# Patient Record
Sex: Female | Born: 1935 | Race: White | Hispanic: No | Marital: Married | Smoking: Former smoker
Health system: Southern US, Community
[De-identification: ages and names within clinical notes are randomized; demographics above are authoritative.]

## PROBLEM LIST (undated history)

## (undated) DIAGNOSIS — K5730 Diverticulosis of large intestine without perforation or abscess without bleeding: Secondary | ICD-10-CM

## (undated) DIAGNOSIS — W19XXXA Unspecified fall, initial encounter: Secondary | ICD-10-CM

## (undated) DIAGNOSIS — M4800 Spinal stenosis, site unspecified: Secondary | ICD-10-CM

## (undated) DIAGNOSIS — G4700 Insomnia, unspecified: Secondary | ICD-10-CM

## (undated) DIAGNOSIS — N390 Urinary tract infection, site not specified: Secondary | ICD-10-CM

## (undated) DIAGNOSIS — M8580 Other specified disorders of bone density and structure, unspecified site: Secondary | ICD-10-CM

## (undated) DIAGNOSIS — E559 Vitamin D deficiency, unspecified: Secondary | ICD-10-CM

## (undated) DIAGNOSIS — J449 Chronic obstructive pulmonary disease, unspecified: Secondary | ICD-10-CM

## (undated) DIAGNOSIS — C50919 Malignant neoplasm of unspecified site of unspecified female breast: Secondary | ICD-10-CM

## (undated) DIAGNOSIS — Z860100 Personal history of colon polyps, unspecified: Secondary | ICD-10-CM

## (undated) DIAGNOSIS — R7303 Prediabetes: Secondary | ICD-10-CM

## (undated) DIAGNOSIS — M1990 Unspecified osteoarthritis, unspecified site: Secondary | ICD-10-CM

## (undated) DIAGNOSIS — N8500 Endometrial hyperplasia, unspecified: Secondary | ICD-10-CM

## (undated) DIAGNOSIS — I10 Essential (primary) hypertension: Secondary | ICD-10-CM

## (undated) DIAGNOSIS — F32A Depression, unspecified: Secondary | ICD-10-CM

## (undated) DIAGNOSIS — F329 Major depressive disorder, single episode, unspecified: Secondary | ICD-10-CM

## (undated) HISTORY — DX: Vitamin D deficiency, unspecified: E55.9

## (undated) HISTORY — PX: OTHER SURGICAL HISTORY: SHX169

## (undated) HISTORY — DX: Malignant neoplasm of unspecified site of unspecified female breast: C50.919

## (undated) HISTORY — DX: Other specified disorders of bone density and structure, unspecified site: M85.80

## (undated) HISTORY — DX: Spinal stenosis, site unspecified: M48.00

## (undated) HISTORY — DX: Unspecified osteoarthritis, unspecified site: M19.90

## (undated) HISTORY — DX: Diverticulosis of large intestine without perforation or abscess without bleeding: K57.30

## (undated) HISTORY — DX: Essential (primary) hypertension: I10

## (undated) HISTORY — DX: Personal history of colon polyps, unspecified: Z86.0100

## (undated) HISTORY — DX: Personal history of colonic polyps: Z86.010

## (undated) HISTORY — PX: TONSILLECTOMY AND ADENOIDECTOMY: SHX28

## (undated) HISTORY — PX: CARPAL TUNNEL RELEASE: SHX101

## (undated) HISTORY — PX: CERVICAL FUSION: SHX112

## (undated) HISTORY — DX: Depression, unspecified: F32.A

## (undated) HISTORY — DX: Endometrial hyperplasia, unspecified: N85.00

## (undated) HISTORY — DX: Major depressive disorder, single episode, unspecified: F32.9

## (undated) HISTORY — DX: Insomnia, unspecified: G47.00

---

## 1966-02-16 HISTORY — PX: TUBAL LIGATION: SHX77

## 1973-02-16 HISTORY — PX: CERVICAL LAMINECTOMY: SHX94

## 1994-12-18 HISTORY — PX: ROTATOR CUFF REPAIR: SHX139

## 1995-11-17 HISTORY — PX: HYSTEROSCOPY: SHX211

## 1996-01-17 DIAGNOSIS — N8500 Endometrial hyperplasia, unspecified: Secondary | ICD-10-CM

## 1996-01-17 HISTORY — PX: TOTAL ABDOMINAL HYSTERECTOMY: SHX209

## 1996-01-17 HISTORY — PX: BILATERAL SALPINGOOPHORECTOMY: SHX1223

## 1996-01-17 HISTORY — DX: Endometrial hyperplasia, unspecified: N85.00

## 1998-09-17 HISTORY — PX: TOTAL HIP ARTHROPLASTY: SHX124

## 1998-09-24 ENCOUNTER — Inpatient Hospital Stay (HOSPITAL_COMMUNITY): Admission: RE | Admit: 1998-09-24 | Discharge: 1998-09-27 | Payer: Self-pay | Admitting: Orthopaedic Surgery

## 1998-12-18 HISTORY — PX: CHOLECYSTECTOMY: SHX55

## 1998-12-20 ENCOUNTER — Encounter (INDEPENDENT_AMBULATORY_CARE_PROVIDER_SITE_OTHER): Payer: Self-pay | Admitting: Specialist

## 1998-12-20 ENCOUNTER — Observation Stay (HOSPITAL_COMMUNITY): Admission: RE | Admit: 1998-12-20 | Discharge: 1998-12-20 | Payer: Self-pay | Admitting: Surgery

## 1999-02-17 DIAGNOSIS — M4800 Spinal stenosis, site unspecified: Secondary | ICD-10-CM

## 1999-02-17 HISTORY — DX: Spinal stenosis, site unspecified: M48.00

## 1999-07-04 ENCOUNTER — Ambulatory Visit (HOSPITAL_COMMUNITY): Admission: RE | Admit: 1999-07-04 | Discharge: 1999-07-04 | Payer: Self-pay | Admitting: Internal Medicine

## 2001-03-23 ENCOUNTER — Inpatient Hospital Stay (HOSPITAL_COMMUNITY): Admission: RE | Admit: 2001-03-23 | Discharge: 2001-03-24 | Payer: Self-pay | Admitting: Orthopaedic Surgery

## 2001-07-15 ENCOUNTER — Inpatient Hospital Stay (HOSPITAL_COMMUNITY): Admission: RE | Admit: 2001-07-15 | Discharge: 2001-07-16 | Payer: Self-pay | Admitting: Orthopaedic Surgery

## 2001-09-26 ENCOUNTER — Inpatient Hospital Stay (HOSPITAL_COMMUNITY): Admission: RE | Admit: 2001-09-26 | Discharge: 2001-09-28 | Payer: Self-pay | Admitting: Orthopaedic Surgery

## 2001-10-28 ENCOUNTER — Encounter: Admission: RE | Admit: 2001-10-28 | Discharge: 2001-10-28 | Payer: Self-pay | Admitting: Infectious Diseases

## 2003-03-20 HISTORY — PX: BREAST LUMPECTOMY: SHX2

## 2003-04-13 ENCOUNTER — Encounter (HOSPITAL_COMMUNITY): Admission: RE | Admit: 2003-04-13 | Discharge: 2003-07-12 | Payer: Self-pay | Admitting: Surgery

## 2003-04-26 ENCOUNTER — Ambulatory Visit (HOSPITAL_COMMUNITY): Admission: RE | Admit: 2003-04-26 | Discharge: 2003-04-26 | Payer: Self-pay | Admitting: Surgery

## 2003-04-26 ENCOUNTER — Encounter (INDEPENDENT_AMBULATORY_CARE_PROVIDER_SITE_OTHER): Payer: Self-pay | Admitting: *Deleted

## 2003-05-02 ENCOUNTER — Ambulatory Visit: Admission: RE | Admit: 2003-05-02 | Discharge: 2003-07-31 | Payer: Self-pay | Admitting: *Deleted

## 2003-05-09 ENCOUNTER — Ambulatory Visit (HOSPITAL_COMMUNITY): Admission: RE | Admit: 2003-05-09 | Discharge: 2003-05-09 | Payer: Self-pay | Admitting: Surgery

## 2003-06-17 HISTORY — PX: TOTAL KNEE ARTHROPLASTY: SHX125

## 2003-07-11 ENCOUNTER — Inpatient Hospital Stay (HOSPITAL_COMMUNITY): Admission: RE | Admit: 2003-07-11 | Discharge: 2003-07-15 | Payer: Self-pay | Admitting: Orthopedic Surgery

## 2003-11-30 ENCOUNTER — Ambulatory Visit: Admission: RE | Admit: 2003-11-30 | Discharge: 2003-11-30 | Payer: Self-pay | Admitting: *Deleted

## 2004-01-02 ENCOUNTER — Ambulatory Visit: Payer: Self-pay | Admitting: Internal Medicine

## 2004-02-10 ENCOUNTER — Observation Stay (HOSPITAL_COMMUNITY): Admission: EM | Admit: 2004-02-10 | Discharge: 2004-02-10 | Payer: Self-pay | Admitting: Emergency Medicine

## 2004-02-17 DIAGNOSIS — C50919 Malignant neoplasm of unspecified site of unspecified female breast: Secondary | ICD-10-CM

## 2004-02-17 HISTORY — DX: Malignant neoplasm of unspecified site of unspecified female breast: C50.919

## 2004-04-24 ENCOUNTER — Ambulatory Visit: Payer: Self-pay | Admitting: Internal Medicine

## 2004-04-30 ENCOUNTER — Ambulatory Visit: Payer: Self-pay | Admitting: Oncology

## 2004-06-13 ENCOUNTER — Encounter: Admission: RE | Admit: 2004-06-13 | Discharge: 2004-06-13 | Payer: Self-pay | Admitting: Orthopedic Surgery

## 2004-07-18 ENCOUNTER — Ambulatory Visit: Payer: Self-pay | Admitting: Internal Medicine

## 2004-08-18 ENCOUNTER — Encounter: Admission: RE | Admit: 2004-08-18 | Discharge: 2004-08-18 | Payer: Self-pay | Admitting: Orthopedic Surgery

## 2004-09-24 ENCOUNTER — Ambulatory Visit: Payer: Self-pay | Admitting: Internal Medicine

## 2004-10-31 ENCOUNTER — Ambulatory Visit (HOSPITAL_COMMUNITY): Admission: RE | Admit: 2004-10-31 | Discharge: 2004-10-31 | Payer: Self-pay | Admitting: Neurosurgery

## 2004-11-06 ENCOUNTER — Ambulatory Visit: Payer: Self-pay | Admitting: Oncology

## 2004-11-16 HISTORY — PX: TOTAL KNEE ARTHROPLASTY: SHX125

## 2004-11-16 HISTORY — PX: LUMBAR LAMINECTOMY: SHX95

## 2004-11-18 ENCOUNTER — Ambulatory Visit: Payer: Self-pay | Admitting: Internal Medicine

## 2005-03-31 ENCOUNTER — Ambulatory Visit: Payer: Self-pay | Admitting: Internal Medicine

## 2005-05-08 ENCOUNTER — Ambulatory Visit: Payer: Self-pay | Admitting: Oncology

## 2005-06-17 ENCOUNTER — Ambulatory Visit: Payer: Self-pay | Admitting: Internal Medicine

## 2005-07-21 ENCOUNTER — Ambulatory Visit: Payer: Self-pay | Admitting: Internal Medicine

## 2005-11-04 ENCOUNTER — Ambulatory Visit: Payer: Self-pay | Admitting: Oncology

## 2005-11-06 LAB — CBC WITH DIFFERENTIAL/PLATELET
BASO%: 0.6 % (ref 0.0–2.0)
Eosinophils Absolute: 0.1 10*3/uL (ref 0.0–0.5)
LYMPH%: 19.8 % (ref 14.0–48.0)
MCHC: 34.6 g/dL (ref 32.0–36.0)
MCV: 95.4 fL (ref 81.0–101.0)
MONO#: 0.4 10*3/uL (ref 0.1–0.9)
MONO%: 8.7 % (ref 0.0–13.0)
NEUT#: 3.2 10*3/uL (ref 1.5–6.5)
Platelets: 214 10*3/uL (ref 145–400)
RBC: 4.53 10*6/uL (ref 3.70–5.32)
RDW: 13.7 % (ref 11.3–14.5)
WBC: 4.7 10*3/uL (ref 3.9–10.0)

## 2005-11-06 LAB — COMPREHENSIVE METABOLIC PANEL
ALT: 18 U/L (ref 0–40)
AST: 16 U/L (ref 0–37)
Alkaline Phosphatase: 114 U/L (ref 39–117)
CO2: 29 mEq/L (ref 19–32)
Creatinine, Ser: 0.78 mg/dL (ref 0.40–1.20)
Sodium: 144 mEq/L (ref 135–145)
Total Bilirubin: 0.6 mg/dL (ref 0.3–1.2)
Total Protein: 6.8 g/dL (ref 6.0–8.3)

## 2005-11-06 LAB — CANCER ANTIGEN 27.29: CA 27.29: 11 U/mL (ref 0–39)

## 2005-12-08 ENCOUNTER — Ambulatory Visit: Payer: Self-pay | Admitting: Internal Medicine

## 2006-01-15 ENCOUNTER — Ambulatory Visit: Payer: Self-pay | Admitting: Internal Medicine

## 2006-01-27 ENCOUNTER — Inpatient Hospital Stay (HOSPITAL_COMMUNITY): Admission: RE | Admit: 2006-01-27 | Discharge: 2006-01-30 | Payer: Self-pay | Admitting: Orthopedic Surgery

## 2006-04-27 ENCOUNTER — Ambulatory Visit (HOSPITAL_BASED_OUTPATIENT_CLINIC_OR_DEPARTMENT_OTHER): Admission: RE | Admit: 2006-04-27 | Discharge: 2006-04-27 | Payer: Self-pay | Admitting: Orthopedic Surgery

## 2006-05-01 LAB — HM COLONOSCOPY: HM Colonoscopy: ABNORMAL

## 2006-05-11 ENCOUNTER — Ambulatory Visit: Payer: Self-pay | Admitting: Oncology

## 2006-05-14 ENCOUNTER — Ambulatory Visit: Payer: Self-pay | Admitting: Internal Medicine

## 2006-05-14 LAB — CONVERTED CEMR LAB
CO2: 34 meq/L — ABNORMAL HIGH (ref 19–32)
Creatinine, Ser: 0.7 mg/dL (ref 0.4–1.2)
Glucose, Bld: 81 mg/dL (ref 70–99)
Potassium: 4.9 meq/L (ref 3.5–5.1)
Sodium: 145 meq/L (ref 135–145)

## 2006-06-11 ENCOUNTER — Ambulatory Visit: Payer: Self-pay | Admitting: Internal Medicine

## 2006-06-11 LAB — CONVERTED CEMR LAB
ALT: 26 units/L (ref 0–40)
Albumin: 4.3 g/dL (ref 3.5–5.2)
Alkaline Phosphatase: 103 units/L (ref 39–117)
Direct LDL: 116.6 mg/dL
Total CHOL/HDL Ratio: 3.2
VLDL: 14 mg/dL (ref 0–40)

## 2006-06-25 ENCOUNTER — Observation Stay (HOSPITAL_COMMUNITY): Admission: EM | Admit: 2006-06-25 | Discharge: 2006-06-25 | Payer: Self-pay | Admitting: Emergency Medicine

## 2006-08-16 ENCOUNTER — Telehealth: Payer: Self-pay | Admitting: Internal Medicine

## 2006-08-19 ENCOUNTER — Ambulatory Visit: Payer: Self-pay | Admitting: Internal Medicine

## 2006-08-31 ENCOUNTER — Encounter: Payer: Self-pay | Admitting: Internal Medicine

## 2006-08-31 ENCOUNTER — Ambulatory Visit: Payer: Self-pay | Admitting: Internal Medicine

## 2006-09-09 ENCOUNTER — Ambulatory Visit: Payer: Self-pay | Admitting: Internal Medicine

## 2006-09-09 DIAGNOSIS — J45901 Unspecified asthma with (acute) exacerbation: Secondary | ICD-10-CM | POA: Insufficient documentation

## 2006-09-09 DIAGNOSIS — G4700 Insomnia, unspecified: Secondary | ICD-10-CM

## 2006-09-09 DIAGNOSIS — Z860100 Personal history of colon polyps, unspecified: Secondary | ICD-10-CM | POA: Insufficient documentation

## 2006-09-09 DIAGNOSIS — Z853 Personal history of malignant neoplasm of breast: Secondary | ICD-10-CM

## 2006-09-09 DIAGNOSIS — M4800 Spinal stenosis, site unspecified: Secondary | ICD-10-CM | POA: Insufficient documentation

## 2006-09-09 DIAGNOSIS — F329 Major depressive disorder, single episode, unspecified: Secondary | ICD-10-CM

## 2006-09-09 DIAGNOSIS — M1990 Unspecified osteoarthritis, unspecified site: Secondary | ICD-10-CM

## 2006-09-09 DIAGNOSIS — K5730 Diverticulosis of large intestine without perforation or abscess without bleeding: Secondary | ICD-10-CM | POA: Insufficient documentation

## 2006-09-09 DIAGNOSIS — I10 Essential (primary) hypertension: Secondary | ICD-10-CM

## 2006-09-09 LAB — CONVERTED CEMR LAB
Cholesterol: 208 mg/dL (ref 0–200)
HDL: 52 mg/dL (ref >39.0)
Total CHOL/HDL Ratio: 4
VLDL: 12 mg/dL (ref 0–40)

## 2006-09-15 ENCOUNTER — Telehealth: Payer: Self-pay | Admitting: Internal Medicine

## 2006-09-23 ENCOUNTER — Encounter: Payer: Self-pay | Admitting: Internal Medicine

## 2006-10-14 ENCOUNTER — Ambulatory Visit: Payer: Self-pay | Admitting: Oncology

## 2006-10-19 ENCOUNTER — Encounter: Payer: Self-pay | Admitting: Internal Medicine

## 2006-10-19 LAB — CBC WITH DIFFERENTIAL/PLATELET
BASO%: 0.5 % (ref 0.0–2.0)
EOS%: 1.4 % (ref 0.0–7.0)
HCT: 39.5 % (ref 34.8–46.6)
LYMPH%: 17.4 % (ref 14.0–48.0)
MCH: 32.9 pg (ref 26.0–34.0)
MCHC: 35.1 g/dL (ref 32.0–36.0)
MCV: 93.9 fL (ref 81.0–101.0)
MONO#: 0.5 10*3/uL (ref 0.1–0.9)
MONO%: 9.1 % (ref 0.0–13.0)
NEUT%: 71.6 % (ref 39.6–76.8)
Platelets: 165 10*3/uL (ref 145–400)
RBC: 4.21 10*6/uL (ref 3.70–5.32)

## 2006-10-19 LAB — HEMOGLOBIN A1C: Hgb A1c MFr Bld: 5.4 % (ref 4.6–6.1)

## 2006-10-19 LAB — COMPREHENSIVE METABOLIC PANEL
CO2: 22 mEq/L (ref 19–32)
Creatinine, Ser: 0.72 mg/dL (ref 0.40–1.20)
Glucose, Bld: 114 mg/dL — ABNORMAL HIGH (ref 70–99)
Total Bilirubin: 0.5 mg/dL (ref 0.3–1.2)

## 2006-10-19 LAB — CANCER ANTIGEN 27.29: CA 27.29: 9 U/mL (ref 0–39)

## 2006-11-26 ENCOUNTER — Ambulatory Visit: Payer: Self-pay | Admitting: Internal Medicine

## 2006-12-09 ENCOUNTER — Ambulatory Visit: Payer: Self-pay | Admitting: Internal Medicine

## 2006-12-09 LAB — CONVERTED CEMR LAB
AST: 27 units/L (ref 0–37)
Alkaline Phosphatase: 107 units/L (ref 39–117)
CO2: 34 meq/L — ABNORMAL HIGH (ref 19–32)
Chloride: 105 meq/L (ref 96–112)
Creatinine, Ser: 0.8 mg/dL (ref 0.4–1.2)
Hemoglobin: 14 g/dL
Hgb A1c MFr Bld: 5.5 % (ref 4.6–6.0)
Potassium: 5.6 meq/L — ABNORMAL HIGH (ref 3.5–5.1)
Sodium: 146 meq/L — ABNORMAL HIGH (ref 135–145)
Total Bilirubin: 1 mg/dL (ref 0.3–1.2)
Total Protein: 6.6 g/dL (ref 6.0–8.3)

## 2006-12-16 ENCOUNTER — Ambulatory Visit: Payer: Self-pay | Admitting: Internal Medicine

## 2006-12-22 ENCOUNTER — Ambulatory Visit (HOSPITAL_COMMUNITY): Admission: EM | Admit: 2006-12-22 | Discharge: 2006-12-22 | Payer: Self-pay | Admitting: Emergency Medicine

## 2007-01-05 ENCOUNTER — Inpatient Hospital Stay (HOSPITAL_COMMUNITY): Admission: RE | Admit: 2007-01-05 | Discharge: 2007-01-07 | Payer: Self-pay | Admitting: Orthopedic Surgery

## 2007-01-11 ENCOUNTER — Telehealth: Payer: Self-pay | Admitting: Internal Medicine

## 2007-01-17 DIAGNOSIS — M8580 Other specified disorders of bone density and structure, unspecified site: Secondary | ICD-10-CM

## 2007-01-17 HISTORY — DX: Other specified disorders of bone density and structure, unspecified site: M85.80

## 2007-01-27 ENCOUNTER — Encounter: Payer: Self-pay | Admitting: Internal Medicine

## 2007-02-03 ENCOUNTER — Encounter: Payer: Self-pay | Admitting: Internal Medicine

## 2007-03-10 ENCOUNTER — Encounter: Payer: Self-pay | Admitting: Internal Medicine

## 2007-03-14 ENCOUNTER — Encounter: Payer: Self-pay | Admitting: Internal Medicine

## 2007-03-21 ENCOUNTER — Telehealth: Payer: Self-pay | Admitting: Internal Medicine

## 2007-04-01 ENCOUNTER — Ambulatory Visit: Payer: Self-pay | Admitting: Internal Medicine

## 2007-04-01 DIAGNOSIS — E785 Hyperlipidemia, unspecified: Secondary | ICD-10-CM

## 2007-04-01 LAB — CONVERTED CEMR LAB
Calcium: 10.1 mg/dL (ref 8.4–10.5)
Chloride: 100 meq/L (ref 96–112)
Cholesterol: 213 mg/dL (ref 0–200)
Creatinine, Ser: 0.9 mg/dL (ref 0.4–1.2)
Direct LDL: 124.1 mg/dL
GFR calc non Af Amer: 65 mL/min
Hgb A1c MFr Bld: 5.4 % (ref 4.6–6.0)
Sodium: 139 meq/L (ref 135–145)
Total CHOL/HDL Ratio: 3.1
VLDL: 8 mg/dL (ref 0–40)

## 2007-04-07 ENCOUNTER — Ambulatory Visit: Payer: Self-pay | Admitting: Internal Medicine

## 2007-04-07 LAB — CONVERTED CEMR LAB
Cholesterol, target level: 200 mg/dL
HDL goal, serum: 40 mg/dL
LDL Goal: 100 mg/dL

## 2007-06-16 ENCOUNTER — Telehealth: Payer: Self-pay | Admitting: *Deleted

## 2007-07-01 ENCOUNTER — Telehealth: Payer: Self-pay | Admitting: Internal Medicine

## 2007-07-22 ENCOUNTER — Ambulatory Visit: Payer: Self-pay | Admitting: Oncology

## 2007-07-26 LAB — CBC WITH DIFFERENTIAL/PLATELET
BASO%: 0.3 % (ref 0.0–2.0)
Eosinophils Absolute: 0.1 10*3/uL (ref 0.0–0.5)
LYMPH%: 13.8 % — ABNORMAL LOW (ref 14.0–48.0)
MCHC: 34.7 g/dL (ref 32.0–36.0)
MCV: 93.4 fL (ref 81.0–101.0)
MONO#: 0.5 10*3/uL (ref 0.1–0.9)
MONO%: 9.5 % (ref 0.0–13.0)
NEUT#: 3.7 10*3/uL (ref 1.5–6.5)
RBC: 4.53 10*6/uL (ref 3.70–5.32)
RDW: 13.8 % (ref 11.3–14.5)
WBC: 4.9 10*3/uL (ref 3.9–10.0)

## 2007-07-26 LAB — COMPREHENSIVE METABOLIC PANEL
ALT: 22 U/L (ref 0–35)
Albumin: 4.5 g/dL (ref 3.5–5.2)
Alkaline Phosphatase: 93 U/L (ref 39–117)
Glucose, Bld: 93 mg/dL (ref 70–99)
Potassium: 4.7 mEq/L (ref 3.5–5.3)
Sodium: 136 mEq/L (ref 135–145)
Total Bilirubin: 0.9 mg/dL (ref 0.3–1.2)
Total Protein: 7.1 g/dL (ref 6.0–8.3)

## 2007-07-26 LAB — CANCER ANTIGEN 27.29: CA 27.29: 18 U/mL (ref 0–39)

## 2007-08-02 ENCOUNTER — Encounter: Payer: Self-pay | Admitting: Internal Medicine

## 2007-08-12 ENCOUNTER — Ambulatory Visit: Payer: Self-pay | Admitting: Internal Medicine

## 2007-08-12 DIAGNOSIS — T887XXA Unspecified adverse effect of drug or medicament, initial encounter: Secondary | ICD-10-CM

## 2007-08-12 LAB — CONVERTED CEMR LAB
AST: 24 units/L (ref 0–37)
Albumin: 4.1 g/dL (ref 3.5–5.2)
BUN: 14 mg/dL (ref 6–23)
Calcium: 9.7 mg/dL (ref 8.4–10.5)
Chloride: 103 meq/L (ref 96–112)
Creatinine, Ser: 0.7 mg/dL (ref 0.4–1.2)
GFR calc Af Amer: 105 mL/min
GFR calc non Af Amer: 87 mL/min
Hgb A1c MFr Bld: 5.5 % (ref 4.6–6.0)
Total Bilirubin: 1 mg/dL (ref 0.3–1.2)

## 2007-08-26 ENCOUNTER — Ambulatory Visit: Payer: Self-pay | Admitting: Internal Medicine

## 2007-09-06 ENCOUNTER — Telehealth: Payer: Self-pay | Admitting: Internal Medicine

## 2007-11-23 ENCOUNTER — Telehealth: Payer: Self-pay | Admitting: Internal Medicine

## 2007-11-29 ENCOUNTER — Telehealth: Payer: Self-pay | Admitting: Internal Medicine

## 2007-12-01 ENCOUNTER — Telehealth: Payer: Self-pay | Admitting: Internal Medicine

## 2007-12-02 ENCOUNTER — Ambulatory Visit: Payer: Self-pay | Admitting: Internal Medicine

## 2007-12-26 ENCOUNTER — Ambulatory Visit: Payer: Self-pay | Admitting: Internal Medicine

## 2007-12-30 LAB — CONVERTED CEMR LAB
ALT: 22 units/L (ref 0–35)
AST: 23 units/L (ref 0–37)
Bilirubin, Direct: 0.1 mg/dL (ref 0.0–0.3)
CO2: 33 meq/L — ABNORMAL HIGH (ref 19–32)
Chloride: 107 meq/L (ref 96–112)
Creatinine, Ser: 0.6 mg/dL (ref 0.4–1.2)
GFR calc non Af Amer: 104 mL/min
Hgb A1c MFr Bld: 5.4 % (ref 4.6–6.0)
Potassium: 5.7 meq/L — ABNORMAL HIGH (ref 3.5–5.1)
Sodium: 147 meq/L — ABNORMAL HIGH (ref 135–145)
Total Bilirubin: 1 mg/dL (ref 0.3–1.2)

## 2008-01-04 ENCOUNTER — Ambulatory Visit: Payer: Self-pay | Admitting: Internal Medicine

## 2008-01-04 DIAGNOSIS — E8881 Metabolic syndrome: Secondary | ICD-10-CM

## 2008-01-09 ENCOUNTER — Encounter: Payer: Self-pay | Admitting: Internal Medicine

## 2008-01-26 ENCOUNTER — Telehealth: Payer: Self-pay | Admitting: Internal Medicine

## 2008-01-30 ENCOUNTER — Encounter: Payer: Self-pay | Admitting: Internal Medicine

## 2008-02-27 ENCOUNTER — Ambulatory Visit: Payer: Self-pay | Admitting: Internal Medicine

## 2008-02-28 ENCOUNTER — Telehealth: Payer: Self-pay | Admitting: Internal Medicine

## 2008-04-03 ENCOUNTER — Telehealth: Payer: Self-pay | Admitting: *Deleted

## 2008-04-12 ENCOUNTER — Telehealth: Payer: Self-pay | Admitting: Internal Medicine

## 2008-05-04 ENCOUNTER — Ambulatory Visit: Payer: Self-pay | Admitting: Internal Medicine

## 2008-05-04 DIAGNOSIS — H6120 Impacted cerumen, unspecified ear: Secondary | ICD-10-CM

## 2008-05-07 LAB — CONVERTED CEMR LAB
Albumin: 4.1 g/dL (ref 3.5–5.2)
Bilirubin, Direct: 0.2 mg/dL (ref 0.0–0.3)
HDL: 79.7 mg/dL (ref >39.00)
Total CHOL/HDL Ratio: 3
Total Protein: 6.4 g/dL (ref 6.0–8.3)
VLDL: 10.8 mg/dL (ref 0.0–40.0)

## 2008-05-11 ENCOUNTER — Telehealth: Payer: Self-pay | Admitting: Internal Medicine

## 2008-05-11 DIAGNOSIS — M259 Joint disorder, unspecified: Secondary | ICD-10-CM | POA: Insufficient documentation

## 2008-06-26 ENCOUNTER — Telehealth: Payer: Self-pay | Admitting: Internal Medicine

## 2008-06-27 ENCOUNTER — Telehealth: Payer: Self-pay | Admitting: Internal Medicine

## 2008-07-12 ENCOUNTER — Ambulatory Visit: Payer: Self-pay | Admitting: Internal Medicine

## 2008-07-12 LAB — CONVERTED CEMR LAB
Cholesterol: 192 mg/dL (ref 0–200)
HDL: 80.3 mg/dL (ref >39.00)
LDL Cholesterol: 102 mg/dL — ABNORMAL HIGH (ref 0–99)
Triglycerides: 48 mg/dL (ref 0.0–149.0)

## 2008-07-27 ENCOUNTER — Telehealth: Payer: Self-pay | Admitting: Internal Medicine

## 2008-07-31 ENCOUNTER — Ambulatory Visit: Payer: Self-pay | Admitting: Oncology

## 2008-08-01 ENCOUNTER — Encounter: Payer: Self-pay | Admitting: Internal Medicine

## 2008-08-01 LAB — CBC WITH DIFFERENTIAL/PLATELET
Basophils Absolute: 0 10*3/uL (ref 0.0–0.1)
Eosinophils Absolute: 0 10*3/uL (ref 0.0–0.5)
HGB: 14.5 g/dL (ref 11.6–15.9)
MCV: 95.9 fL (ref 79.5–101.0)
MONO#: 0.6 10*3/uL (ref 0.1–0.9)
NEUT#: 4.4 10*3/uL (ref 1.5–6.5)
RDW: 13.3 % (ref 11.2–14.5)
WBC: 6 10*3/uL (ref 3.9–10.3)
lymph#: 0.9 10*3/uL (ref 0.9–3.3)

## 2008-08-01 LAB — COMPREHENSIVE METABOLIC PANEL
Albumin: 4.4 g/dL (ref 3.5–5.2)
BUN: 20 mg/dL (ref 6–23)
Calcium: 9.3 mg/dL (ref 8.4–10.5)
Chloride: 100 mEq/L (ref 96–112)
Glucose, Bld: 94 mg/dL (ref 70–99)
Potassium: 4.3 mEq/L (ref 3.5–5.3)
Sodium: 136 mEq/L (ref 135–145)
Total Protein: 6.9 g/dL (ref 6.0–8.3)

## 2008-09-19 ENCOUNTER — Encounter: Payer: Self-pay | Admitting: Internal Medicine

## 2008-10-03 ENCOUNTER — Telehealth: Payer: Self-pay | Admitting: Internal Medicine

## 2008-11-05 ENCOUNTER — Telehealth: Payer: Self-pay | Admitting: Internal Medicine

## 2008-11-12 ENCOUNTER — Ambulatory Visit: Payer: Self-pay | Admitting: Internal Medicine

## 2008-11-12 ENCOUNTER — Telehealth: Payer: Self-pay | Admitting: Internal Medicine

## 2008-11-12 DIAGNOSIS — R0989 Other specified symptoms and signs involving the circulatory and respiratory systems: Secondary | ICD-10-CM

## 2008-12-14 ENCOUNTER — Telehealth: Payer: Self-pay | Admitting: Internal Medicine

## 2008-12-21 ENCOUNTER — Encounter (INDEPENDENT_AMBULATORY_CARE_PROVIDER_SITE_OTHER): Payer: Self-pay | Admitting: *Deleted

## 2008-12-26 ENCOUNTER — Telehealth: Payer: Self-pay | Admitting: Internal Medicine

## 2008-12-28 ENCOUNTER — Encounter: Admission: RE | Admit: 2008-12-28 | Discharge: 2008-12-28 | Payer: Self-pay | Admitting: Allergy

## 2009-01-30 ENCOUNTER — Encounter (INDEPENDENT_AMBULATORY_CARE_PROVIDER_SITE_OTHER): Payer: Self-pay | Admitting: *Deleted

## 2009-01-30 ENCOUNTER — Encounter: Payer: Self-pay | Admitting: Internal Medicine

## 2009-02-04 ENCOUNTER — Ambulatory Visit: Payer: Self-pay | Admitting: Internal Medicine

## 2009-02-04 LAB — CONVERTED CEMR LAB
Direct LDL: 120.1 mg/dL
Total CHOL/HDL Ratio: 3
VLDL: 12.8 mg/dL (ref 0.0–40.0)

## 2009-02-11 LAB — CONVERTED CEMR LAB: Vit D, 25-Hydroxy: 31 ng/mL (ref 30–89)

## 2009-03-11 ENCOUNTER — Ambulatory Visit: Payer: Self-pay | Admitting: Internal Medicine

## 2009-04-12 ENCOUNTER — Encounter: Payer: Self-pay | Admitting: Internal Medicine

## 2009-04-16 ENCOUNTER — Emergency Department (HOSPITAL_COMMUNITY): Admission: EM | Admit: 2009-04-16 | Discharge: 2009-04-17 | Payer: Self-pay | Admitting: Emergency Medicine

## 2009-04-24 ENCOUNTER — Ambulatory Visit: Payer: Self-pay | Admitting: Internal Medicine

## 2009-06-19 ENCOUNTER — Encounter: Payer: Self-pay | Admitting: Internal Medicine

## 2009-07-03 ENCOUNTER — Encounter: Payer: Self-pay | Admitting: Internal Medicine

## 2009-07-12 ENCOUNTER — Ambulatory Visit: Payer: Self-pay | Admitting: Internal Medicine

## 2009-07-12 LAB — CONVERTED CEMR LAB
ALT: 20 units/L (ref 0–35)
AST: 22 units/L (ref 0–37)
Bilirubin, Direct: 0.2 mg/dL (ref 0.0–0.3)
Direct LDL: 114.5 mg/dL
HDL: 92.3 mg/dL (ref >39.00)
Total Bilirubin: 0.7 mg/dL (ref 0.3–1.2)

## 2009-07-19 ENCOUNTER — Ambulatory Visit: Payer: Self-pay | Admitting: Internal Medicine

## 2009-07-19 ENCOUNTER — Ambulatory Visit: Payer: Self-pay | Admitting: Cardiovascular Disease

## 2009-07-19 DIAGNOSIS — S060X9A Concussion with loss of consciousness of unspecified duration, initial encounter: Secondary | ICD-10-CM | POA: Insufficient documentation

## 2009-07-30 ENCOUNTER — Ambulatory Visit: Payer: Self-pay | Admitting: Oncology

## 2009-08-01 ENCOUNTER — Encounter: Payer: Self-pay | Admitting: Internal Medicine

## 2009-08-01 LAB — CBC WITH DIFFERENTIAL/PLATELET
BASO%: 0.6 % (ref 0.0–2.0)
Basophils Absolute: 0 10*3/uL (ref 0.0–0.1)
Eosinophils Absolute: 0.2 10*3/uL (ref 0.0–0.5)
HCT: 40.7 % (ref 34.8–46.6)
HGB: 13.6 g/dL (ref 11.6–15.9)
MONO#: 0.5 10*3/uL (ref 0.1–0.9)
NEUT#: 3.4 10*3/uL (ref 1.5–6.5)
NEUT%: 67.4 % (ref 38.4–76.8)
WBC: 5.1 10*3/uL (ref 3.9–10.3)
lymph#: 1 10*3/uL (ref 0.9–3.3)

## 2009-08-01 LAB — COMPREHENSIVE METABOLIC PANEL
ALT: 20 U/L (ref 0–35)
BUN: 17 mg/dL (ref 6–23)
CO2: 28 mEq/L (ref 19–32)
Calcium: 8.9 mg/dL (ref 8.4–10.5)
Chloride: 99 mEq/L (ref 96–112)
Creatinine, Ser: 0.69 mg/dL (ref 0.40–1.20)

## 2009-08-01 LAB — CANCER ANTIGEN 27.29: CA 27.29: 13 U/mL (ref 0–39)

## 2009-08-01 LAB — LACTATE DEHYDROGENASE: LDH: 152 U/L (ref 94–250)

## 2009-08-01 LAB — VITAMIN D 25 HYDROXY (VIT D DEFICIENCY, FRACTURES): Vit D, 25-Hydroxy: 38 ng/mL (ref 30–89)

## 2009-08-07 ENCOUNTER — Telehealth: Payer: Self-pay | Admitting: Internal Medicine

## 2009-08-07 ENCOUNTER — Ambulatory Visit: Payer: Self-pay | Admitting: Internal Medicine

## 2009-08-07 LAB — CONVERTED CEMR LAB: Rapid Strep: NEGATIVE

## 2009-09-27 ENCOUNTER — Telehealth: Payer: Self-pay | Admitting: Internal Medicine

## 2009-10-07 ENCOUNTER — Telehealth: Payer: Self-pay | Admitting: Internal Medicine

## 2009-10-08 ENCOUNTER — Ambulatory Visit: Payer: Self-pay | Admitting: Internal Medicine

## 2009-10-08 DIAGNOSIS — S2249XA Multiple fractures of ribs, unspecified side, initial encounter for closed fracture: Secondary | ICD-10-CM

## 2009-10-11 ENCOUNTER — Telehealth: Payer: Self-pay | Admitting: Internal Medicine

## 2009-11-06 ENCOUNTER — Telehealth: Payer: Self-pay | Admitting: Internal Medicine

## 2009-11-21 ENCOUNTER — Telehealth: Payer: Self-pay | Admitting: Internal Medicine

## 2009-12-11 ENCOUNTER — Telehealth: Payer: Self-pay | Admitting: Internal Medicine

## 2009-12-16 ENCOUNTER — Telehealth: Payer: Self-pay | Admitting: Internal Medicine

## 2009-12-19 ENCOUNTER — Encounter: Payer: Self-pay | Admitting: Internal Medicine

## 2009-12-30 ENCOUNTER — Ambulatory Visit: Payer: Self-pay | Admitting: Internal Medicine

## 2010-01-06 ENCOUNTER — Encounter: Payer: Self-pay | Admitting: Internal Medicine

## 2010-01-19 ENCOUNTER — Encounter: Payer: Self-pay | Admitting: Internal Medicine

## 2010-01-20 ENCOUNTER — Telehealth: Payer: Self-pay | Admitting: Internal Medicine

## 2010-02-05 ENCOUNTER — Encounter: Payer: Self-pay | Admitting: Internal Medicine

## 2010-03-09 ENCOUNTER — Encounter
Admission: RE | Admit: 2010-03-09 | Discharge: 2010-03-09 | Payer: Self-pay | Source: Home / Self Care | Attending: Orthopedic Surgery | Admitting: Orthopedic Surgery

## 2010-03-09 ENCOUNTER — Encounter: Payer: Self-pay | Admitting: Allergy

## 2010-03-11 ENCOUNTER — Telehealth: Payer: Self-pay | Admitting: Internal Medicine

## 2010-03-13 ENCOUNTER — Telehealth: Payer: Self-pay | Admitting: Internal Medicine

## 2010-03-14 ENCOUNTER — Telehealth: Payer: Self-pay | Admitting: Internal Medicine

## 2010-03-17 ENCOUNTER — Other Ambulatory Visit: Payer: Self-pay | Admitting: Neurosurgery

## 2010-03-17 DIAGNOSIS — M48061 Spinal stenosis, lumbar region without neurogenic claudication: Secondary | ICD-10-CM

## 2010-03-18 NOTE — Progress Notes (Signed)
Summary: sinus  Phone Note Call from Patient Call back at [phone]   Caller: VM Summary of Call: Sinus infection with thick yellow goo from nose & coughing it up.  Quickly goes into bronchitis for me.  T 99.5.  Send med to [facility].   Initial call taken by: Rudy Jew, RN,  December 16, 2009 9:00 AM  Follow-up for Phone Call        per dr Hervey Ard- may have z pack Follow-up by: Willy Eddy, LPN,  December 16, 2009 9:20 AM    New/Updated Medications: ZITHROMAX Z-PAK 250 MG TABS (AZITHROMYCIN) As directed Prescriptions: ZITHROMAX Z-PAK 250 MG TABS (AZITHROMYCIN) As directed  #1 x 0   Entered by:   Rudy Jew, RN   Authorized by:   Stacie Glaze MD   Signed by:   Rudy Jew, RN on 12/16/2009   Method used:   Electronically to        [facility] (retail)       [address]       [place], [place]  [id number]       Ph: [phone] or [phone]       Fax: [phone]   RxID:   [id number]

## 2010-03-18 NOTE — Progress Notes (Signed)
Summary: list of meds  Phone Note Call from Patient   Summary of Call: Wants copy of her meds faxed to her at [phone] or she will pick up if necessary.  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  Says it looks better on Dr. Ronnette Hila paper.  She could make list, but... Initial call taken by: Rudy Jew, RN,  November 06, 2009 12:17 PM  Follow-up for Phone Call        done Follow-up by: Willy Eddy, LPN,  November 06, 2009 1:54 PM

## 2010-03-18 NOTE — Progress Notes (Signed)
Summary: Phone note  Phone Note Call from Patient   Reason for Call: Acute Illness, Referral Summary of Call: Patient states she is a diabetic and was wondering why we didn't do a hemoglobin A1C when she had her blood drawn last. Patient would like to have A1C done if possible. She can be reached at [phone]. Initial call taken by: Darra Lis RMA,  Jun 26, 2008 11:10 AM  Follow-up for Phone Call        probably didnt do because it was 5.4 last time which is excellent and she has kept weight off, but she wants we can do anytime. just call scheduler and we can schedule with dm as code Follow-up by: Willy Eddy, LPN,  Jun 26, 2008 12:13 PM

## 2010-03-18 NOTE — Assessment & Plan Note (Signed)
Summary: f/u per pt//slm   Vital Signs:  Patient profile:   77 year old female Weight:      176 pounds Temp:     98.6 degrees F oral Pulse rate:   88 / minute Pulse rhythm:   regular Resp:     12 per minute BP sitting:   138 / 86  Vitals Entered By: Lynann Beaver CMA AAMA (December 30, 2009 2:00 PM)  Primary Care Provider:  Stacie Glaze MD   History of Present Illness: Dr [name] at [facility]/ [name] will do an anterior approach for hip right on Dec First needs a test for Chckpox to get shingles vaccines. Has no recollection of chicken pox yet had childern with it? Has increased nasal conjestion  and frequent sinus infections with use of saline  Hypertension History:      She denies headache, chest pain, palpitations, dyspnea with exertion, orthopnea, PND, peripheral edema, visual symptoms, neurologic problems, syncope, and side effects from treatment.        Positive major cardiovascular risk factors include female age 67 years old or older, diabetes, hyperlipidemia, and hypertension.  Negative major cardiovascular risk factors include non-tobacco-user status.      Preventive Screening-Counseling & Management  Alcohol-Tobacco     Smoking Status: quit     Tobacco Counseling: to remain off tobacco products  Problems Prior to Update: 1)  Contact or Exposure To Varicella  (ICD-V01.71) 2)  Bronchitis, Chronic, Acute Exacerbation  (ICD-491.21) 3)  Closed Fracture of Three Ribs  (ICD-807.03) 4)  Concussion With Loc of Unspecified Duration  (ICD-850.5) 5)  Oth Symptoms Involving Respiratory System&chest  (ICD-786.9) 6)  Other and Unspecified Hyperlipidemia  (ICD-272.4) 7)  Unspecified Disorder of Ankle and Foot Joint  (ICD-719.97) 8)  Cerumen Impaction, Bilateral  (ICD-380.4) 9)  Dysmetabolic Syndrome X  (ICD-277.7) 10)  Uns Advrs Eff Uns Rx Medicinal&biological Sbstnc  (ICD-995.20) 11)  Hyperlipidemia  (ICD-272.4) 12)  Diverticulosis, Colon  (ICD-562.10) 13)  Colonic Polyps, Hx  of  (ICD-V12.72) 14)  Family History of Colon Ca 1st Degree Relative <60  (ICD-V16.0) 15)  Family History Diabetes 1st Degree Relative  (ICD-V18.0) 16)  Stenosis, Lumbar Spine  (ICD-724.02) 17)  Osteoarthritis  (ICD-715.90) 18)  Hypertension  (ICD-401.9) 19)  Depression  (ICD-311) 20)  Breast Cancer, Hx of  (ICD-V10.3) 21)  Asthma  (ICD-493.90) 22)  Insomnia, Persistent  (ICD-307.42)  Medications Prior to Update: 1)  Calcium 500/d 500-125 Mg-Unit  Tabs (Calcium Carbonate-Vitamin D) .... 2 Once Daily 2)  Wellbutrin Xl 300 Mg  Tb24 (Bupropion Hcl) .... Once Daily 3)  Singulair 10 Mg  Tabs (Montelukast Sodium) .... Once Daily 4)  Klonopin 1 Mg  Tabs (Clonazepam) .... Once Daily 5)  Celebrex 200 Mg  Caps (Celecoxib) .... One By Mouth Bid 6)  Temazepam 30 Mg  Caps (Temazepam) .... One At Bedtime 7)  Benicar Hct 20-12.5 Mg Tabs (Olmesartan Medoxomil-Hctz) .Marland Kitchen.. 1 Once Daily 8)  Diazepam 10 Mg Tabs (Diazepam) .... One/ Half To One By Mouth Two Times A Day Prn 9)  B-50 Complex  Tabs (B Complex-Folic Acid) .Marland Kitchen.. 1 Once Daily 10)  Femara 2.5 Mg Tabs (Letrozole) .... One By Mouth Day 11)  Fish Oil Concentrate 1000 Mg Caps (Omega-3 Fatty Acids) .... Two By Mouth Bid 12)  Proair Hfa 108 (90 Base) Mcg/act Aers (Albuterol Sulfate) .... Use As Directed 13)  Vitamin C 500 Mg Tabs (Ascorbic Acid) .Marland Kitchen.. 1 Once Daily 14)  Zinc 15 66 Mg Tabs (Zinc Sulfate) .Marland KitchenMarland KitchenMarland Kitchen  1 Once Daily 15)  Ergocalciferol 50000 Unit Caps (Ergocalciferol) .Marland Kitchen.. 1 Every Week 16)  Spiriva Handihaler 18 Mcg Caps (Tiotropium Bromide Monohydrate) .... One Cap in Ward Dailu 17)  Hydrocortisone Ace-Pramoxine 2.5-1 % Crea (Hydrocortisone Ace-Pramoxine) .... Aplly Pr Two Times A Day 18)  Hydrocodone-Acetaminophen 5-500 Mg Tabs (Hydrocodone-Acetaminophen) .... One By Mouth  Two Times A Day 19)  Prednisone (Pak) 5 Mg Tabs (Prednisone) .... Take As Directed 20)  Zithromax Z-Pak 250 Mg Tabs (Azithromycin) .... As Directed  Current Medications  (verified): 1)  Calcium 500/d 500-125 Mg-Unit  Tabs (Calcium Carbonate-Vitamin D) .... 2 Once Daily 2)  Wellbutrin Xl 300 Mg  Tb24 (Bupropion Hcl) .... Once Daily 3)  Singulair 10 Mg  Tabs (Montelukast Sodium) .... Once Daily 4)  Klonopin 1 Mg  Tabs (Clonazepam) .... Once Daily 5)  Temazepam 30 Mg  Caps (Temazepam) .... One At Bedtime 6)  Benicar Hct 20-12.5 Mg Tabs (Olmesartan Medoxomil-Hctz) .Marland Kitchen.. 1 Once Daily 7)  Diazepam 10 Mg Tabs (Diazepam) .... One/ Half To One By Mouth Two Times A Day Prn 8)  Proair Hfa 108 (90 Base) Mcg/act Aers (Albuterol Sulfate) .... Use As Directed 9)  Vitamin C 500 Mg Tabs (Ascorbic Acid) .Marland Kitchen.. 1 Once Daily 10)  Hydrocortisone Ace-Pramoxine 2.5-1 % Crea (Hydrocortisone Ace-Pramoxine) .... Aplly Pr Two Times A Day 11)  Hydrocodone-Acetaminophen 5-500 Mg Tabs (Hydrocodone-Acetaminophen) .... One By Mouth  Two Times A Day 12)  Fluticasone Propionate 50 Mcg/act Susp (Fluticasone Propionate) .... Two Sprays in Nostril Daily  Allergies (verified): 1)  Morphine Sulfate (Morphine Sulfate) 2)  Sulfamethoxazole (Sulfamethoxazole)  Past History:  Family History: Last updated: 09-24-2006 mother died of complications of age Family History Diabetes 1st degree relative father and two brothers Family History of Colon CA 1st degree relative <60 father  Social History: Last updated: 2006-09-24 Occupation: Married Former Smoker quit 20 years ago   30+ pk years  Risk Factors: Exercise: yes (04/07/2007)  Risk Factors: Smoking Status: quit (12/30/2009)  Past medical, surgical, family and social histories (including risk factors) reviewed, and no changes noted (except as noted below).  Past Medical History: Reviewed history from Sep 24, 2006 and no changes required. Asthma Breast cancer, hx of Depression Hypertension Osteoarthritis insomnia Colonic polyps, hx of Diverticulosis, colon  Past Surgical History: Reviewed history from 09/24/2006 and no changes  required. Cervical fusion Cervical laminectomy x4 Lumbar laminectomy with spinal stenosis Cholecystectomy Total hip replacement Hysterectomy Total knee replacement Lumpectomy manipulation of hip for dislocation Tonsillectomy Carpal tunnel release bilateral  Family History: Reviewed history from September 24, 2006 and no changes required. mother died of complications of age Family History Diabetes 1st degree relative father and two brothers Family History of Colon CA 1st degree relative <60 father  Social History: Reviewed history from September 24, 2006 and no changes required. Occupation: Married Former Smoker quit 20 years ago   30+ pk years  Review of Systems  The patient denies anorexia, fever, weight loss, weight gain, vision loss, decreased hearing, hoarseness, chest pain, syncope, dyspnea on exertion, peripheral edema, prolonged cough, headaches, hemoptysis, abdominal pain, melena, hematochezia, severe indigestion/heartburn, hematuria, incontinence, genital sores, muscle weakness, suspicious skin lesions, transient blindness, difficulty walking, depression, unusual weight change, abnormal bleeding, enlarged lymph nodes, angioedema, breast masses, and testicular masses.    Physical Exam  General:  alert, well-hydrated, and pale.   Head:  normocephalic and atraumatic.   Eyes:  pupils equal and pupils round.   Ears:  wax impaction bilaterally Nose:  no external deformity and no nasal discharge.  Neck:  No deformities, masses, or tenderness noted. Lungs:  R decreased breath sounds and R wheezes.   Heart:  normal rate and regular rhythm.   Abdomen:  Bowel sounds positive,abdomen soft and non-tender without masses, organomegaly or hernias noted.   Impression & Recommendations:  Problem # 1:  CONTACT OR EXPOSURE TO VARICELLA (ICD-V01.71)  Orders: Venipuncture ([address]) T- * Misc. Laboratory test [phone]) Specimen Handling (09811)  Problem # 2:  DYSMETABOLIC SYNDROME X  (ICD-277.7) monitering of blood sugars  Problem # 3:  HYPERLIPIDEMIA (ICD-272.4)  Labs Reviewed: SGOT: 22 (07/12/2009)   SGPT: 20 (07/12/2009)  Lipid Goals: Chol Goal: 200 (04/07/2007)   HDL Goal: 40 (04/07/2007)   LDL Goal: 100 (04/07/2007)   TG Goal: 150 (04/07/2007)  10 Yr Risk Heart Disease: 13 % Prior 10 Yr Risk Heart Disease: Not enough information (09/09/2006)   HDL:92.30 (07/12/2009), 78.40 (02/04/2009)  LDL:102 (07/12/2008), DEL (04/01/2007)  Chol:229 (07/12/2009), 217 (02/04/2009)  Trig:56.0 (07/12/2009), 64.0 (02/04/2009)  Problem # 4:  HYPERTENSION (ICD-401.9)  Her updated medication list for this problem includes:    Benicar Hct 20-12.5 Mg Tabs (Olmesartan medoxomil-hctz) .Marland Kitchen... 1 once daily  BP today: 138/86 Prior BP: 120/80 (07/19/2009)  10 Yr Risk Heart Disease: 13 % Prior 10 Yr Risk Heart Disease: Not enough information (09/09/2006)  Labs Reviewed: K+: 5.7 (12/26/2007) Creat: : 0.6 (12/26/2007)   Chol: 229 (07/12/2009)   HDL: 92.30 (07/12/2009)   LDL: 102 (07/12/2008)   TG: 56.0 (07/12/2009)  Problem # 5:  ASTHMA (ICD-493.90)  The following medications were removed from the medication list:    Spiriva Handihaler 18 Mcg Caps (Tiotropium bromide monohydrate) ..... One cap in inhailer dailu    Prednisone (pak) 5 Mg Tabs (Prednisone) .Marland Kitchen... Take as directed Her updated medication list for this problem includes:    Singulair 10 Mg Tabs (Montelukast sodium) ..... Once daily    Proair Hfa 108 (90 Base) Mcg/act Aers (Albuterol sulfate) ..... Use as directed  Current Asthma Management Plan:  Green Zone:  SINGULAIR 10 MG  TABS Yellow Zone: Peak Flow:      200 to: 300  SPIRIVA HANDIHALER 18 MCG CAPS PROAIR HFA 108 (90 BASE) MCG/ACT AERS:  2 puffs every 4 hours as needed Red:  PROAIR HFA 108 (90 BASE) MCG/ACT AERS Call your physician for shortness of breath.    Complete Medication List: 1)  Calcium 500/d 500-125 Mg-unit Tabs (Calcium carbonate-vitamin d) ....  2 once daily 2)  Wellbutrin Xl 300 Mg Tb24 (Bupropion hcl) .... Once daily 3)  Singulair 10 Mg Tabs (Montelukast sodium) .... Once daily 4)  Klonopin 1 Mg Tabs (Clonazepam) .... Once daily 5)  Temazepam 30 Mg Caps (Temazepam) .... One at bedtime 6)  Benicar Hct 20-12.5 Mg Tabs (Olmesartan medoxomil-hctz) .Marland Kitchen.. 1 once daily 7)  Diazepam 10 Mg Tabs (Diazepam) .... One/ half to one by mouth two times a day prn 8)  Proair Hfa 108 (90 Base) Mcg/act Aers (Albuterol sulfate) .... Use as directed 9)  Vitamin C 500 Mg Tabs (Ascorbic acid) .Marland Kitchen.. 1 once daily 10)  Hydrocortisone Ace-pramoxine 2.5-1 % Crea (Hydrocortisone ace-pramoxine) .... Aplly pr two times a day 11)  Hydrocodone-acetaminophen 5-500 Mg Tabs (Hydrocodone-acetaminophen) .... One by mouth  two times a day 12)  Fluticasone Propionate 50 Mcg/act Susp (Fluticasone propionate) .... Two sprays in nostril daily  Asthma Management Plan    Plan based on PEF formula: Nunn and Dinah Beers Zone:SINGULAIR 10 MG  TABS  Yellow Zone: SPIRIVA HANDIHALER 18 MCG  CAPS PROAIR HFA 108 (90 BASE) MCG/ACT AERS:  2 puffs every 4 hours as needed  Red Zone: PROAIR HFA 108 (90 BASE) MCG/ACT AERS Call your physician for shortness of breath.    Hypertension Assessment/Plan:      The patient's hypertensive risk group is category C: Target organ damage and/or diabetes.  Her calculated 10 year risk of coronary heart disease is 13 %.  Today's blood pressure is 138/86.  Her blood pressure goal is < 130/80.  Patient Instructions: 1)  Please schedule a follow-up appointment in 3 months. Prescriptions: FLUTICASONE PROPIONATE 50 MCG/ACT SUSP (FLUTICASONE PROPIONATE) two sprays in nostril daily  #1 x 11   Entered and Authorized by:   Mariah Glaze MD   Signed by:   Mariah Glaze MD on 12/30/2009   Method used:   Electronically to        [facility] (retail)       [address]       [place], [place]  [id number]       Ph: [phone] or [phone]        Fax: [phone]   RxID:   [id number]    Orders Added: 1)  Venipuncture [36644] 2)  T- * Misc. Laboratory test [99999] 3)  Specimen Handling [99000] 4)  Est. Patient Level IV [03474]

## 2010-03-18 NOTE — Progress Notes (Signed)
Summary: pale,weak,clammy  Phone Note Call from Patient Call back at Home Phone [phone] Call back at [phone]   Summary of Call: No symptoms now, but so weak & so pale and clammy, cold sweat that everyone tells me to call the doctor.  No fever.  Stopped coughing.  Sinuses dried up.  Taking the  dry up med and antibiotic Avelox, has stopped the Delsym because she has not been coughing.  Had fallen & bruised ribs.  [facility] did xray, bruised ribs, no cracks.  Took about 8 pictures.  Radiologist read.  Rib pain better.  In bed.  No pain or short breath.  Some diarrhea this am.  Neighbors have had intestinal bug who care for her.  Haven't taken the prednisone.  No bleeding.  Took hydrocodone x2 yesterday.  Slept.  Stomach cramping woke her, had diarrhea & 2 hr later, had again.   Reyne Dumas.  Allergic sulfa & morphine.   Initial call taken by: Rudy Jew, RN,  October 11, 2009 12:31 PM  Follow-up for Phone Call        dr Lovell Sheehan noted message and states she really needs to push liquids Follow-up by: Willy Eddy, LPN,  October 11, 2009 3:07 PM  Additional Follow-up for Phone Call Additional follow up Details #1::        Phone Call Completed Additional Follow-up by: Rudy Jew, RN,  October 11, 2009 3:21 PM

## 2010-03-18 NOTE — Progress Notes (Signed)
Summary: sore throat  Phone Note Call from Patient   Caller: Patient Call For: Stacie Glaze MD Summary of Call: Pt is at the hospital with husband.  He has a fx hip and is having surgery.  Has sore throat with no fever.  Wants RX. Shel[facility] Plane.  [phone] Initial call taken by: Lynann Beaver CMA,  August 07, 2009 11:42 AM  Follow-up for Phone Call        ok to do strep test Follow-up by: Birdie Sons MD,  August 07, 2009 11:59 AM  Additional Follow-up for Phone Call Additional follow up Details #1::        Pt agrees. Additional Follow-up by: Lynann Beaver CMA,  August 07, 2009 12:19 PM

## 2010-03-18 NOTE — Assessment & Plan Note (Signed)
Summary: FU PER PT/NJR pt rsc/njr/pt rsc/cjr   Vital Signs:  Patient profile:   77 year old female Height:      67 inches Weight:      174 pounds BMI:     27.35 Temp:     98.2 degrees F oral Pulse rate:   72 / minute Resp:     14 per minute BP sitting:   136 / 86  (left arm)  Vitals Entered By: Willy Eddy, LPN (March 11, 2009 2:08 PM) CC: roa- has been sick for about 10 weeks since october - states he was having asmtha flare   CC:  roa- has been sick for about 10 weeks since october - states he was having asmtha flare.  History of Present Illness: INCREASED STRESSORS "exhausted with the care giver role" had bronchitis and asthma  in the fall, was seen by allergest and inhailers were rxed. She stopped them after she got better she feels that the inhailers and the nebulizers "drive her crazy" she has been out of medications for her blood pressure  Preventive Screening-Counseling & Management  Alcohol-Tobacco     Smoking Status: quit  Problems Prior to Update: 1)  Oth Symptoms Involving Respiratory System&chest  (ICD-786.9) 2)  Other and Unspecified Hyperlipidemia  (ICD-272.4) 3)  Unspecified Disorder of Ankle and Foot Joint  (ICD-719.97) 4)  Cerumen Impaction, Bilateral  (ICD-380.4) 5)  Dysmetabolic Syndrome X  (ICD-277.7) 6)  Uns Advrs Eff Uns Rx Medicinal&biological Sbstnc  (ICD-995.20) 7)  Hyperlipidemia  (ICD-272.4) 8)  Aodm  (ICD-250.00) 9)  Diverticulosis, Colon  (ICD-562.10) 10)  Colonic Polyps, Hx of  (ICD-V12.72) 11)  Family History of Colon Ca 1st Degree Relative <60  (ICD-V16.0) 12)  Family History Diabetes 1st Degree Relative  (ICD-V18.0) 13)  Stenosis, Lumbar Spine  (ICD-724.02) 14)  Osteoarthritis  (ICD-715.90) 15)  Hypertension  (ICD-401.9) 16)  Depression  (ICD-311) 17)  Breast Cancer, Hx of  (ICD-V10.3) 18)  Asthma  (ICD-493.90) 19)  Insomnia, Persistent  (ICD-307.42)  Medications Prior to Update: 1)  Calcium 500/d 500-125 Mg-Unit  Tabs  (Calcium Carbonate-Vitamin D) .... 2 Once Daily 2)  Wellbutrin Xl 300 Mg  Tb24 (Bupropion Hcl) .... Once Daily 3)  Singulair 10 Mg  Tabs (Montelukast Sodium) .... Once Daily 4)  Klonopin 1 Mg  Tabs (Clonazepam) .... Once Daily 5)  Celebrex 200 Mg  Caps (Celecoxib) .... One By Mouth Bid 6)  Temazepam 30 Mg  Caps (Temazepam) .... One At Bedtime 7)  Benicar Hct 20-12.5 Mg Tabs (Olmesartan Medoxomil-Hctz) .Marland Kitchen.. 1 Once Daily 8)  Valium 5 Mg  Tabs (Diazepam) .Marland Kitchen.. 1 Once Daily and        Q 8 Hours As Needed 9)  B-50 Complex  Tabs (B Complex-Folic Acid) .Marland Kitchen.. 1 Once Daily 10)  Femara 2.5 Mg Tabs (Letrozole) .... One By Mouth Day 11)  Fish Oil Concentrate 1000 Mg Caps (Omega-3 Fatty Acids) .... Two By Mouth Bid 12)  Proair Hfa 108 (90 Base) Mcg/act Aers (Albuterol Sulfate) .... Use As Directed 13)  Vitamin C 500 Mg Tabs (Ascorbic Acid) .Marland Kitchen.. 1 Once Daily 14)  Zinc 15 66 Mg Tabs (Zinc Sulfate) .Marland Kitchen.. 1 Once Daily 15)  Ergocalciferol 50000 Unit Caps (Ergocalciferol) .Marland Kitchen.. 1 Twice A Week 16)  Spiriva Handihaler 18 Mcg Caps (Tiotropium Bromide Monohydrate) .... One Cap in [place] Dailu 17)  Biaxin 500 Mg Tabs (Clarithromycin) .... One Two Times A Day For 10 Days.  Current Medications (verified): 1)  Calcium 500/d 500-125  Mg-Unit  Tabs (Calcium Carbonate-Vitamin D) .... 2 Once Daily 2)  Wellbutrin Xl 300 Mg  Tb24 (Bupropion Hcl) .... Once Daily 3)  Singulair 10 Mg  Tabs (Montelukast Sodium) .... Once Daily 4)  Klonopin 1 Mg  Tabs (Clonazepam) .... Once Daily 5)  Celebrex 200 Mg  Caps (Celecoxib) .... One By Mouth Bid 6)  Temazepam 30 Mg  Caps (Temazepam) .... One At Bedtime 7)  Benicar Hct 20-12.5 Mg Tabs (Olmesartan Medoxomil-Hctz) .Marland Kitchen.. 1 Once Daily 8)  Valium 5 Mg  Tabs (Diazepam) .Marland Kitchen.. 1 Once Daily and        Q 8 Hours As Needed 9)  B-50 Complex  Tabs (B Complex-Folic Acid) .Marland Kitchen.. 1 Once Daily 10)  Femara 2.5 Mg Tabs (Letrozole) .... One By Mouth Day 11)  Fish Oil Concentrate 1000 Mg Caps (Omega-3 Fatty  Acids) .... Two By Mouth Bid 12)  Proair Hfa 108 (90 Base) Mcg/act Aers (Albuterol Sulfate) .... Use As Directed 13)  Vitamin C 500 Mg Tabs (Ascorbic Acid) .Marland Kitchen.. 1 Once Daily 14)  Zinc 15 66 Mg Tabs (Zinc Sulfate) .Marland Kitchen.. 1 Once Daily 15)  Ergocalciferol 50000 Unit Caps (Ergocalciferol) .Marland Kitchen.. 1 Twice A Week 16)  Spiriva Handihaler 18 Mcg Caps (Tiotropium Bromide Monohydrate) .... One Cap in [place] Dailu 17)  Hydrocortisone Ace-Pramoxine 2.5-1 % Crea (Hydrocortisone Ace-Pramoxine) .... 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  Past Medical History: Reviewed history from October 06, 2006 and no changes required. Asthma Breast cancer, hx of Depression Hypertension Osteoarthritis insomnia Colonic polyps, hx of Diverticulosis, colon  Past Surgical History: Reviewed history from 06-Oct-2006 and no changes required. Cervical fusion Cervical laminectomy x4 Lumbar laminectomy with spinal stenosis Cholecystectomy Total hip replacement Hysterectomy Total knee replacement Lumpectomy manipulation of hip for dislocation Tonsillectomy Carpal tunnel release bilateral  Family History: Reviewed history from 2006-10-06 and no changes required. mother died of complications of age Family History Diabetes 1st degree relative father and two brothers Family  History of Colon CA 1st degree relative <60 father  Social History: Reviewed history from 10/06/2006 and no changes required. Occupation: Married Former Smoker quit 20 years ago   30+ pk years  Review of Systems  The patient denies anorexia, fever, weight loss, weight gain, vision loss, decreased hearing, hoarseness, chest pain, syncope, dyspnea on exertion, peripheral edema, prolonged cough, headaches, hemoptysis, abdominal pain, melena, hematochezia, severe indigestion/heartburn, hematuria, incontinence, genital sores, muscle weakness, suspicious skin lesions, transient blindness, difficulty walking, depression, unusual weight change, abnormal bleeding, enlarged lymph nodes, angioedema, and breast masses.    Physical Exam  General:  Well-developed,well-nourished,in no acute distress; alert,appropriate and cooperative throughout examination Head:  Normocephalic and atraumatic without obvious abnormalities. No apparent alopecia or balding. Eyes:  pupils equal and pupils round.   Ears:  wax impaction bilaterally Nose:  no external deformity and no nasal discharge.   Mouth:  good dentition and pharynx pink and moist.   Neck:  No deformities, masses,  or tenderness noted. Lungs:  normal respiratory effort and no wheezes.   Heart:  normal rate and regular rhythm.   Abdomen:  Bowel sounds positive,abdomen soft and non-tender without masses, organomegaly or hernias noted. Extremities:  No clubbing, cyanosis, edema, or deformity noted with normal full range of motion of all joints.   Neurologic:  No cranial nerve deficits noted. Station and gait are normal. Plantar reflexes are down-going bilaterally. DTRs are symmetrical throughout. Sensory, motor and coordinative functions appear intact.   Impression & Recommendations:  Problem # 1:  HYPERLIPIDEMIA (ICD-272.4)  Labs Reviewed: SGOT: 23 (05/04/2008)   SGPT: 20 (05/04/2008)  Lipid Goals: Chol Goal: 200 (04/07/2007)   HDL Goal: 40  (04/07/2007)   LDL Goal: 100 (04/07/2007)   TG Goal: 150 (04/07/2007)  Prior 10 Yr Risk Heart Disease: Not enough information (09/09/2006)   HDL:78.40 (02/04/2009), 80.30 (07/12/2008)  LDL:102 (07/12/2008), DEL (04/01/2007)  Chol:217 (02/04/2009), 192 (07/12/2008)  Trig:64.0 (02/04/2009), 48.0 (07/12/2008)  Problem # 2:  AODM (ICD-250.00)  Her updated medication list for this problem includes:    Benicar Hct 20-12.5 Mg Tabs (Olmesartan medoxomil-hctz) .Marland Kitchen... 1 once daily  Labs Reviewed: Creat: 0.6 (12/26/2007)    Reviewed HgBA1c results: 5.7 (02/04/2009)  5.6 (07/12/2008)  Problem # 3:  DEPRESSION (ICD-311) Assessment: Unchanged  Her updated medication list for this problem includes:    Wellbutrin Xl 300 Mg Tb24 (Bupropion hcl) ..... Once daily    Klonopin 1 Mg Tabs (Clonazepam) ..... Once daily    Valium 5 Mg Tabs (Diazepam) .Marland Kitchen... 1 once daily and        q 8 hours as needed  Discussed treatment options, including trial of antidpressant medication. Will refer to behavioral health. Follow-up call in in 24-48 hours and recheck in 2 weeks, sooner as needed. Patient agrees to call if any worsening of symptoms or thoughts of doing harm arise. Verified that the patient has no suicidal ideation at this time.   Problem # 4:  DIVERTICULOSIS, COLON (ICD-562.10) Assessment: Unchanged had a fissue at the recutm anapram Labs Reviewed: Creat: 0.6 (12/26/2007)    Reviewed HgBA1c results: 5.7 (02/04/2009)  5.6 (07/12/2008)  Colonoscopy:  Labs Reviewed: Hgb: 14.0 (12/09/2006)     Complete Medication List: 1)  Calcium 500/d 500-125 Mg-unit Tabs (Calcium carbonate-vitamin d) .... 2 once daily 2)  Wellbutrin Xl 300 Mg Tb24 (Bupropion hcl) .... Once daily 3)  Singulair 10 Mg Tabs (Montelukast sodium) .... Once daily 4)  Klonopin 1 Mg Tabs (Clonazepam) .... Once daily 5)  Celebrex 200 Mg Caps (Celecoxib) .... One by mouth bid 6)  Temazepam 30 Mg Caps (Temazepam) .... One at bedtime 7)  Benicar  Hct 20-12.5 Mg Tabs (Olmesartan medoxomil-hctz) .Marland Kitchen.. 1 once daily 8)  Valium 5 Mg Tabs (Diazepam) .Marland Kitchen.. 1 once daily and        q 8 hours as needed 9)  B-50 Complex Tabs (B complex-folic acid) .Marland Kitchen.. 1 once daily 10)  Femara 2.5 Mg Tabs (Letrozole) .... One by mouth day 11)  Fish Oil Concentrate 1000 Mg Caps (Omega-3 fatty acids) .... Two by mouth bid 12)  Proair Hfa 108 (90 Base) Mcg/act Aers (Albuterol sulfate) .... Use as directed 13)  Vitamin C 500 Mg Tabs (Ascorbic acid) .Marland Kitchen.. 1 once daily 14)  Zinc 15 66 Mg Tabs (Zinc sulfate) .Marland Kitchen.. 1 once daily 15)  Ergocalciferol 50000 Unit Caps (Ergocalciferol) .Marland Kitchen.. 1 twice a week 16)  Spiriva Handihaler 18 Mcg Caps (Tiotropium bromide monohydrate) .... One cap in inhailer dailu 17)  Hydrocortisone Ace-pramoxine 2.5-1 % Crea (Hydrocortisone ace-pramoxine) .... Aplly pr two times a day  Patient Instructions: 1)  Please schedule a follow-up appointment in 3 months. Prescriptions: ERGOCALCIFEROL 50000 UNIT CAPS (ERGOCALCIFEROL) 1 twice a week  #10 x 6   Entered and Authorized by:   Stacie Glaze MD   Signed by:   Stacie Glaze MD on 03/11/2009   Method used:   Electronically to        [facility] (retail)       [address]       [place], [place]  [id number]       Ph: [phone] or [phone]       Fax: [phone]   RxID:   [id number] HYDROCORTISONE ACE-PRAMOXINE 2.5-1 % CREA (HYDROCORTISONE ACE-PRAMOXINE) aplly pr two times a day  #1 tube x 2   Entered and Authorized by:   Stacie Glaze MD   Signed by:   Stacie Glaze MD on 03/11/2009   Method used:   Electronically to        [facility] (retail)       [address]       [place], [place]  [address]       Ph: [phone] or [phone]       Fax: [phone]   RxID:   [id number]

## 2010-03-18 NOTE — Letter (Signed)
Summary: [facility]  [facility]   Imported By: Maryln Gottron 07/11/2009 15:21:55  _____________________________________________________________________  External Attachment:    Type:   Image     Comment:   External Document

## 2010-03-18 NOTE — Assessment & Plan Note (Signed)
Summary: 5 month follow up/cjr   Vital Signs:  Patient profile:   77 year old female Height:      67 inches Weight:      176 pounds BMI:     27.67 Temp:     98.2 degrees F oral Pulse rate:   72 / minute Resp:     14 per minute BP sitting:   120 / 80  (left arm)  Vitals Entered By: Willy Eddy, LPN (July 19, 1608 9:32 AM) CC: roa labs- skin tear to rt lower leg this am from fall--cleaned,xeroform applied, and dressed with instructions   CC:  roa labs- skin tear to rt lower leg this am from fall--cleaned, xeroform applied, and and dressed with instructions.  Preventive Screening-Counseling & Management  Alcohol-Tobacco     Smoking Status: quit  Current Problems (verified): 1)  Oth Symptoms Involving Respiratory System&chest  (ICD-786.9) 2)  Other and Unspecified Hyperlipidemia  (ICD-272.4) 3)  Unspecified Disorder of Ankle and Foot Joint  (ICD-719.97) 4)  Cerumen Impaction, Bilateral  (ICD-380.4) 5)  Dysmetabolic Syndrome X  (ICD-277.7) 6)  Uns Advrs Eff Uns Rx Medicinal&biological Sbstnc  (ICD-995.20) 7)  Hyperlipidemia  (ICD-272.4) 8)  Aodm  (ICD-250.00) 9)  Diverticulosis, Colon  (ICD-562.10) 10)  Colonic Polyps, Hx of  (ICD-V12.72) 11)  Family History of Colon Ca 1st Degree Relative <60  (ICD-V16.0) 12)  Family History Diabetes 1st Degree Relative  (ICD-V18.0) 13)  Stenosis, Lumbar Spine  (ICD-724.02) 14)  Osteoarthritis  (ICD-715.90) 15)  Hypertension  (ICD-401.9) 16)  Depression  (ICD-311) 17)  Breast Cancer, Hx of  (ICD-V10.3) 18)  Asthma  (ICD-493.90) 19)  Insomnia, Persistent  (ICD-307.42)  Current Medications (verified): 1)  Calcium 500/d 500-125 Mg-Unit  Tabs (Calcium Carbonate-Vitamin D) .... 2 Once Daily 2)  Wellbutrin Xl 300 Mg  Tb24 (Bupropion Hcl) .... Once Daily 3)  Singulair 10 Mg  Tabs (Montelukast Sodium) .... Once Daily 4)  Klonopin 1 Mg  Tabs (Clonazepam) .... Once Daily 5)  Celebrex 200 Mg  Caps (Celecoxib) .... One By Mouth Bid 6)   Temazepam 30 Mg  Caps (Temazepam) .... One At Bedtime 7)  Benicar Hct 20-12.5 Mg Tabs (Olmesartan Medoxomil-Hctz) .Marland Kitchen.. 1 Once Daily 8)  Valium 5 Mg  Tabs (Diazepam) .Marland Kitchen.. 1 Once Daily and        Q 8 Hours As Needed 9)  B-50 Complex  Tabs (B Complex-Folic Acid) .Marland Kitchen.. 1 Once Daily 10)  Femara 2.5 Mg Tabs (Letrozole) .... One By Mouth Day 11)  Fish Oil Concentrate 1000 Mg Caps (Omega-3 Fatty Acids) .... Two By Mouth Bid 12)  Proair Hfa 108 (90 Base) Mcg/act Aers (Albuterol Sulfate) .... Use As Directed 13)  Vitamin C 500 Mg Tabs (Ascorbic Acid) .Marland Kitchen.. 1 Once Daily 14)  Zinc 15 66 Mg Tabs (Zinc Sulfate) .Marland Kitchen.. 1 Once Daily 15)  Ergocalciferol 50000 Unit Caps (Ergocalciferol) .Marland Kitchen.. 1 Every Week 16)  Spiriva Handihaler 18 Mcg Caps (Tiotropium Bromide Monohydrate) .... One Cap in [place] Dailu 17)  Hydrocortisone Ace-Pramoxine 2.5-1 % Crea (Hydrocortisone Ace-Pramoxine) .... Aplly Pr Two Times A Day  Allergies: 1)  Morphine Sulfate (Morphine Sulfate) 2)  Sulfamethoxazole (Sulfamethoxazole)  Past History:  Family History: Last updated: September 30, 2006 mother died of complications of age Family History Diabetes 1st degree relative father and two brothers Family History of Colon CA 1st degree relative <60 father  Social History: Last updated: 09/30/06 Occupation: Married Former Smoker quit 20 years ago   30+ pk years  Risk  Factors: Exercise: yes (04/07/2007)  Risk Factors: Smoking Status: quit (07/19/2009)  Past medical, surgical, family and social histories (including risk factors) reviewed, and no changes noted (except as noted below).  Past Medical History: Reviewed history from 09/09/2006 and no changes required. Asthma Breast cancer, hx of Depression Hypertension Osteoarthritis insomnia Colonic polyps, hx of Diverticulosis, colon  Past Surgical History: Reviewed history from 09/09/2006 and no changes required. Cervical fusion Cervical laminectomy x4 Lumbar laminectomy with  spinal stenosis Cholecystectomy Total hip replacement Hysterectomy Total knee replacement Lumpectomy manipulation of hip for dislocation Tonsillectomy Carpal tunnel release bilateral  Family History: Reviewed history from 09/09/2006 and no changes required. mother died of complications of age Family History Diabetes 1st degree relative father and two brothers Family History of Colon CA 1st degree relative <60 father  Social History: Reviewed history from 09/09/2006 and no changes required. Occupation: Married Former Smoker quit 20 years ago   30+ pk years  Review of Systems  The patient denies anorexia, fever, weight loss, weight gain, vision loss, decreased hearing, hoarseness, chest pain, syncope, dyspnea on exertion, peripheral edema, prolonged cough, headaches, hemoptysis, abdominal pain, melena, hematochezia, severe indigestion/heartburn, hematuria, incontinence, genital sores, muscle weakness, suspicious skin lesions, transient blindness, difficulty walking, depression, unusual weight change, abnormal bleeding, enlarged lymph nodes, angioedema, and breast masses.    Physical Exam  General:  Well-developed,well-nourished,in no acute distress; alert,appropriate and cooperative throughout examination Head:  Normocephalic and atraumatic without obvious abnormalities. No apparent alopecia or balding. Eyes:  pupils equal and pupils round.   Ears:  wax impaction bilaterally Nose:  no external deformity and no nasal discharge.   Mouth:  good dentition and pharynx pink and moist.   Neck:  No deformities, masses, or tenderness noted. Lungs:  normal respiratory effort and no wheezes.   Heart:  normal rate and regular rhythm.   Abdomen:  Bowel sounds positive,abdomen soft and non-tender without masses, organomegaly or hernias noted. Extremities:  No clubbing, cyanosis, edema, or deformity noted with normal full range of motion of all joints.   Neurologic:  No cranial nerve deficits  noted. Station and gait are normal. Plantar reflexes are down-going bilaterally. DTRs are symmetrical throughout. Sensory, motor and coordinative functions appear intact.   Impression & Recommendations:  Problem # 1:  HYPERLIPIDEMIA (ICD-272.4)  Labs Reviewed: SGOT: 22 (07/12/2009)   SGPT: 20 (07/12/2009)  Lipid Goals: Chol Goal: 200 (04/07/2007)   HDL Goal: 40 (04/07/2007)   LDL Goal: 100 (04/07/2007)   TG Goal: 150 (04/07/2007)  Prior 10 Yr Risk Heart Disease: Not enough information (09/09/2006)   HDL:92.30 (07/12/2009), 78.40 (02/04/2009)  LDL:102 (07/12/2008), DEL (04/01/2007)  Chol:229 (07/12/2009), 217 (02/04/2009)  Trig:56.0 (07/12/2009), 64.0 (02/04/2009)  Problem # 2:  DYSMETABOLIC SYNDROME X (ICD-277.7)  Labs Reviewed: Creat: 0.6 (12/26/2007)    Reviewed HgBA1c results: 5.8 (07/12/2009)  5.7 (02/04/2009)  Problem # 3:  HYPERTENSION (ICD-401.9)  Her updated medication list for this problem includes:    Benicar Hct 20-12.5 Mg Tabs (Olmesartan medoxomil-hctz) .Marland Kitchen... 1 once daily  BP today: 120/80 Prior BP: 136/86 (03/11/2009)  Prior 10 Yr Risk Heart Disease: Not enough information (09/09/2006)  Labs Reviewed: K+: 5.7 (12/26/2007) Creat: : 0.6 (12/26/2007)   Chol: 229 (07/12/2009)   HDL: 92.30 (07/12/2009)   LDL: 102 (07/12/2008)   TG: 56.0 (07/12/2009)  Problem # 4:  AODM (ICD-250.00)  Her updated medication list for this problem includes:    Benicar Hct 20-12.5 Mg Tabs (Olmesartan medoxomil-hctz) .Marland Kitchen... 1 once daily  Problem # 5:  CONCUSSION WITH LOC OF UNSPECIFIED DURATION (ICD-850.5)  fell today and has possitional dizzyness wit marked nystagmus to the right and forhead tenderness CT asap to r/o subdural possible concusion? bed rest observations hyusband to come pick up and drive today to CT now for uncontrasted emergent scan  Orders: Radiology Referral (Radiology)  Complete Medication List: 1)  Calcium 500/d 500-125 Mg-unit Tabs (Calcium  carbonate-vitamin d) .... 2 once daily 2)  Wellbutrin Xl 300 Mg Tb24 (Bupropion hcl) .... Once daily 3)  Singulair 10 Mg Tabs (Montelukast sodium) .... Once daily 4)  Klonopin 1 Mg Tabs (Clonazepam) .... Once daily 5)  Celebrex 200 Mg Caps (Celecoxib) .... One by mouth bid 6)  Temazepam 30 Mg Caps (Temazepam) .... One at bedtime 7)  Benicar Hct 20-12.5 Mg Tabs (Olmesartan medoxomil-hctz) .Marland Kitchen.. 1 once daily 8)  Diazepam 10 Mg Tabs (Diazepam) .... One/ half to one by mouth two times a day prn 9)  B-50 Complex Tabs (B complex-folic acid) .Marland Kitchen.. 1 once daily 10)  Femara 2.5 Mg Tabs (Letrozole) .... One by mouth day 11)  Fish Oil Concentrate 1000 Mg Caps (Omega-3 fatty acids) .... Two by mouth bid 12)  Proair Hfa 108 (90 Base) Mcg/act Aers (Albuterol sulfate) .... Use as directed 13)  Vitamin C 500 Mg Tabs (Ascorbic acid) .Marland Kitchen.. 1 once daily 14)  Zinc 15 66 Mg Tabs (Zinc sulfate) .Marland Kitchen.. 1 once daily 15)  Ergocalciferol 50000 Unit Caps (Ergocalciferol) .Marland Kitchen.. 1 every week 16)  Spiriva Handihaler 18 Mcg Caps (Tiotropium bromide monohydrate) .... One cap in inhailer dailu 17)  Hydrocortisone Ace-pramoxine 2.5-1 % Crea (Hydrocortisone ace-pramoxine) .... Aplly pr two times a day  Patient Instructions: 1)  Please schedule a follow-up appointment in 6 months. 2)  DO NOT USE SSI order on this pt 3)  please override the standing orders 4)  cleared for surgery Prescriptions: DIAZEPAM 10 MG TABS (DIAZEPAM) one/ half to one by mouth two times a day prn  #60 x 1   Entered and Authorized by:   Stacie Glaze MD   Signed by:   Stacie Glaze MD on 07/19/2009   Method used:   Print then Give to Patient   RxID:   [id number]

## 2010-03-18 NOTE — Progress Notes (Signed)
Summary: sinus infection  Phone Note Call from Patient   Caller: Patient Call For: Stacie Glaze MD Summary of Call: Pt has a sinus infection with a fever of 100.5, and requesting meds to [facility].  Sinus headache and yellow/green drainage.  Taking Ibuprofen.  Just started yesterday but has to pick up husband from Rehab tomorrow. [phone] Initial call taken by: Lynann Beaver CMA,  September 27, 2009 12:46 PM  Follow-up for Phone Call        per dr Lovell Sheehan- may have clarithrymycin 500 two times a day for 10 days Follow-up by: Willy Eddy, LPN,  September 27, 2009 2:09 PM    New/Updated Medications: CLARITHROMYCIN 500 MG TABS (CLARITHROMYCIN) one by mouth two times a day x 10 days Prescriptions: CLARITHROMYCIN 500 MG TABS (CLARITHROMYCIN) one by mouth two times a day x 10 days  #20 x 0   Entered by:   Lynann Beaver CMA   Authorized by:   Stacie Glaze MD   Signed by:   Lynann Beaver CMA on 09/27/2009   Method used:   Electronically to        [facility] (retail)       [address]       [place], [place]  [id number]       Ph: [phone] or [phone]       Fax: [phone]   RxID:   [id number]  Notified pt.

## 2010-03-18 NOTE — Letter (Signed)
Summary: Request for Surgical Clearance/[facility]  Request for Surgical Clearance/[facility]   Imported By: Maryln Gottron 07/05/2009 14:53:43  _____________________________________________________________________  External Attachment:    Type:   Image     Comment:   External Document

## 2010-03-18 NOTE — Progress Notes (Signed)
Summary: Metformin not working  Phone Note Call from Patient   Caller: Patient Call For: Dr Lovell Sheehan Summary of Call: Pt cannot take Metformin...Marland KitchenMarland KitchenMarland Kitchenit gives her a headache and abdominal pain.  She has cut her Januvia in half, and is willing to take this but not Metformin.  Would prefer not to take anything.  Needs samples of Januvia or [facility].  Not sure what mg. she is cutting in half. Has had no wgt gain and BSs run in the 70s on Metformin. [phone] Initial call taken by: Lynann Beaver CMA,  November 29, 2007 9:52 AM  Follow-up for Phone Call        try going off all meds and watching diet and blood sugars, if it increases we will add back the [place] Follow-up by: Stacie Glaze MD,  November 29, 2007 2:01 PM  Additional Follow-up for Phone Call Additional follow up Details #1::        Left message on pt's voice mail with Dr. Lovell Sheehan' recommendations. Additional Follow-up by: Lynann Beaver CMA,  November 29, 2007 2:27 PM

## 2010-03-18 NOTE — Assessment & Plan Note (Signed)
Summary: uri-/bmw   Vital Signs:  Patient profile:   77 year old female Height:      67 inches Weight:      176 pounds BMI:     27.67 Temp:     99.3 degrees F oral Pulse rate:   72 / minute  Vitals Entered By: Willy Eddy, LPN (October 08, 2009 10:43 AM) CC: continues to c/o cough- requested more prednisone yesterday Is Patient Diabetic? No   CC:  continues to c/o cough- requested more prednisone yesterday.  Preventive Screening-Counseling & Management  Alcohol-Tobacco     Smoking Status: quit  Current Problems (verified): 1)  Concussion With Loc of Unspecified Duration  (ICD-850.5) 2)  Oth Symptoms Involving Respiratory System&chest  (ICD-786.9) 3)  Other and Unspecified Hyperlipidemia  (ICD-272.4) 4)  Unspecified Disorder of Ankle and Foot Joint  (ICD-719.97) 5)  Cerumen Impaction, Bilateral  (ICD-380.4) 6)  Dysmetabolic Syndrome X  (ICD-277.7) 7)  Uns Advrs Eff Uns Rx Medicinal&biological Sbstnc  (ICD-995.20) 8)  Hyperlipidemia  (ICD-272.4) 9)  Diverticulosis, Colon  (ICD-562.10) 10)  Colonic Polyps, Hx of  (ICD-V12.72) 11)  Family History of Colon Ca 1st Degree Relative <60  (ICD-V16.0) 12)  Family History Diabetes 1st Degree Relative  (ICD-V18.0) 13)  Stenosis, Lumbar Spine  (ICD-724.02) 14)  Osteoarthritis  (ICD-715.90) 15)  Hypertension  (ICD-401.9) 16)  Depression  (ICD-311) 17)  Breast Cancer, Hx of  (ICD-V10.3) 18)  Asthma  (ICD-493.90) 19)  Insomnia, Persistent  (ICD-307.42)  Current Medications (verified): 1)  Calcium 500/d 500-125 Mg-Unit  Tabs (Calcium Carbonate-Vitamin D) .... 2 Once Daily 2)  Wellbutrin Xl 300 Mg  Tb24 (Bupropion Hcl) .... Once Daily 3)  Singulair 10 Mg  Tabs (Montelukast Sodium) .... Once Daily 4)  Klonopin 1 Mg  Tabs (Clonazepam) .... Once Daily 5)  Celebrex 200 Mg  Caps (Celecoxib) .... One By Mouth Bid 6)  Temazepam 30 Mg  Caps (Temazepam) .... One At Bedtime 7)  Benicar Hct 20-12.5 Mg Tabs (Olmesartan Medoxomil-Hctz) .Marland Kitchen..  1 Once Daily 8)  Diazepam 10 Mg Tabs (Diazepam) .... One/ Half To One By Mouth Two Times A Day Prn 9)  B-50 Complex  Tabs (B Complex-Folic Acid) .Marland Kitchen.. 1 Once Daily 10)  Femara 2.5 Mg Tabs (Letrozole) .... One By Mouth Day 11)  Fish Oil Concentrate 1000 Mg Caps (Omega-3 Fatty Acids) .... Two By Mouth Bid 12)  Proair Hfa 108 (90 Base) Mcg/act Aers (Albuterol Sulfate) .... Use As Directed 13)  Vitamin C 500 Mg Tabs (Ascorbic Acid) .Marland Kitchen.. 1 Once Daily 14)  Zinc 15 66 Mg Tabs (Zinc Sulfate) .Marland Kitchen.. 1 Once Daily 15)  Ergocalciferol 50000 Unit Caps (Ergocalciferol) .Marland Kitchen.. 1 Every Week 16)  Spiriva Handihaler 18 Mcg Caps (Tiotropium Bromide Monohydrate) .... One Cap in Cane Beds Dailu 17)  Hydrocortisone Ace-Pramoxine 2.5-1 % Crea (Hydrocortisone Ace-Pramoxine) .... Aplly Pr Two Times A Day  Allergies (verified): 1)  Morphine Sulfate (Morphine Sulfate) 2)  Sulfamethoxazole (Sulfamethoxazole)  Past History:  Family History: Last updated: 10-02-06 mother died of complications of age Family History Diabetes 1st degree relative father and two brothers Family History of Colon CA 1st degree relative <60 father  Social History: Last updated: 10-02-2006 Occupation: Married Former Smoker quit 20 years ago   30+ pk years  Risk Factors: Exercise: yes (04/07/2007)  Risk Factors: Smoking Status: quit (10/08/2009)  Past medical, surgical, family and social histories (including risk factors) reviewed, and no changes noted (except as noted below).  Past Medical History: Reviewed history from  09/09/2006 and no changes required. Asthma Breast cancer, hx of Depression Hypertension Osteoarthritis insomnia Colonic polyps, hx of Diverticulosis, colon  Past Surgical History: Reviewed history from 09/09/2006 and no changes required. Cervical fusion Cervical laminectomy x4 Lumbar laminectomy with spinal stenosis Cholecystectomy Total hip replacement Hysterectomy Total knee  replacement Lumpectomy manipulation of hip for dislocation Tonsillectomy Carpal tunnel release bilateral  Family History: Reviewed history from 09/09/2006 and no changes required. mother died of complications of age Family History Diabetes 1st degree relative father and two brothers Family History of Colon CA 1st degree relative <60 father  Social History: Reviewed history from 09/09/2006 and no changes required. Occupation: Married Former Smoker quit 20 years ago   30+ pk years  Review of Systems       The patient complains of weight gain, hoarseness, chest pain, and dyspnea on exertion.  The patient denies anorexia, fever, weight loss, vision loss, decreased hearing, syncope, peripheral edema, prolonged cough, headaches, hemoptysis, abdominal pain, melena, hematochezia, severe indigestion/heartburn, hematuria, incontinence, genital sores, muscle weakness, suspicious skin lesions, transient blindness, difficulty walking, depression, unusual weight change, abnormal bleeding, enlarged lymph nodes, angioedema, and breast masses.    Physical Exam  General:  Well-developed,well-nourished,in no acute distress; alert,appropriate and cooperative throughout examination Head:  Normocephalic and atraumatic without obvious abnormalities. No apparent alopecia or balding. Eyes:  pupils equal and pupils round.   Lungs:  R decreased breath sounds and R wheezes.   Heart:  normal rate and regular rhythm.     Impression & Recommendations:  Problem # 1:  CLOSED FRACTURE OF THREE RIBS (ICD-807.03) the pt needs hydrocodone fro short term pain  Problem # 2:  BRONCHITIS, CHRONIC, ACUTE EXACERBATION (ICD-491.21) avelox for 7 das and cough meds samples given  Complete Medication List: 1)  Calcium 500/d 500-125 Mg-unit Tabs (Calcium carbonate-vitamin d) .... 2 once daily 2)  Wellbutrin Xl 300 Mg Tb24 (Bupropion hcl) .... Once daily 3)  Singulair 10 Mg Tabs (Montelukast sodium) .... Once daily 4)   Klonopin 1 Mg Tabs (Clonazepam) .... Once daily 5)  Celebrex 200 Mg Caps (Celecoxib) .... One by mouth bid 6)  Temazepam 30 Mg Caps (Temazepam) .... One at bedtime 7)  Benicar Hct 20-12.5 Mg Tabs (Olmesartan medoxomil-hctz) .Marland Kitchen.. 1 once daily 8)  Diazepam 10 Mg Tabs (Diazepam) .... One/ half to one by mouth two times a day prn 9)  B-50 Complex Tabs (B complex-folic acid) .Marland Kitchen.. 1 once daily 10)  Femara 2.5 Mg Tabs (Letrozole) .... One by mouth day 11)  Fish Oil Concentrate 1000 Mg Caps (Omega-3 fatty acids) .... Two by mouth bid 12)  Proair Hfa 108 (90 Base) Mcg/act Aers (Albuterol sulfate) .... Use as directed 13)  Vitamin C 500 Mg Tabs (Ascorbic acid) .Marland Kitchen.. 1 once daily 14)  Zinc 15 66 Mg Tabs (Zinc sulfate) .Marland Kitchen.. 1 once daily 15)  Ergocalciferol 50000 Unit Caps (Ergocalciferol) .Marland Kitchen.. 1 every week 16)  Spiriva Handihaler 18 Mcg Caps (Tiotropium bromide monohydrate) .... One cap in inhailer dailu 17)  Hydrocortisone Ace-pramoxine 2.5-1 % Crea (Hydrocortisone ace-pramoxine) .... Aplly pr two times a day 18)  Hydrocodone-acetaminophen 5-500 Mg Tabs (Hydrocodone-acetaminophen) .... One by mouth  two times a day 19)  Prednisone (pak) 5 Mg Tabs (Prednisone) .... Take as directed  Patient Instructions: 1)  Please schedule a follow-up appointment as needed. Prescriptions: PREDNISONE (PAK) 5 MG TABS (PREDNISONE) take as directed  #1 x 0   Entered and Authorized by:   Stacie Glaze MD   Signed by:  Stacie Glaze MD on 10/08/2009   Method used:   Print then Give to Patient   RxID:   [id number] HYDROCODONE-ACETAMINOPHEN 5-500 MG TABS (HYDROCODONE-ACETAMINOPHEN) one by mouth  two times a day  #60 x 0   Entered and Authorized by:   Stacie Glaze MD   Signed by:   Stacie Glaze MD on 10/08/2009   Method used:   Print then Give to Patient   RxID:   [id number]

## 2010-03-18 NOTE — Progress Notes (Signed)
Summary: cough  Phone Note Call from Patient   Caller: Patient Call For: Stacie Glaze MD Summary of Call: Pt is still coughing and had a fall which increasing the pain with her cough.  Wants prednisone.  Will be here tomorrow  with her husband. Initial call taken by: Lynann Beaver CMA,  October 07, 2009 10:43 AM

## 2010-03-18 NOTE — Progress Notes (Signed)
Summary: REQUEST FOR RX / ABX  Phone Note From Other Clinic   Caller: [facility] [phone] Summary of Call: Pt just had hip surgery and returned home yesterday.... Pt has been exp yellow-greenish sputum, sinus pressure, h/a....goes into lungs quickly and would like abx sent to :  [facility].  Initial call taken by: Debbra Riding,  January 20, 2010 1:47 PM  Follow-up for Phone Call        levoquin 500 daily for 7 days tessilon perles 200 mg one by mouth TID as needed cough  ( n=30) Follow-up by: Stacie Glaze MD,  January 20, 2010 3:58 PM    New/Updated Medications: LEVAQUIN 500 MG TABS (LEVOFLOXACIN) one by mouth daily TESSALON 200 MG CAPS (BENZONATATE) one by mouth tid Prescriptions: TESSALON 200 MG CAPS (BENZONATATE) one by mouth tid  #30 x 0   Entered by:   Lynann Beaver CMA AAMA   Authorized by:   Stacie Glaze MD   Signed by:   Lynann Beaver CMA AAMA on 01/20/2010   Method used:   Electronically to        [facility] (retail)       [address]       [place], [place]  [id number]       Ph: [phone] or [phone]       Fax: [phone]   RxID:   [id number] LEVAQUIN 500 MG TABS (LEVOFLOXACIN) one by mouth daily  #7 x 0   Entered by:   Lynann Beaver CMA AAMA   Authorized by:   Stacie Glaze MD   Signed by:   Lynann Beaver CMA AAMA on 01/20/2010   Method used:   Electronically to        [facility] (retail)       [address]       [place], [place]  [id number]       Ph: [phone] or [phone]       Fax: [phone]   RxID:   [id number]  Notified home health

## 2010-03-18 NOTE — Letter (Signed)
Summary: [facility]  [facility]   Imported By: Maryln Gottron 12/20/2009 15:50:45  _____________________________________________________________________  External Attachment:    Type:   Image     Comment:   External Document

## 2010-03-18 NOTE — Progress Notes (Signed)
  Phone Note Call from Patient   Caller: Patient Call For: Stacie Glaze MD Reason for Call: Acute Illness Summary of Call: Pt. is calling to let Dr. Lovell Sheehan know that she has an appt with Dr. Autumn Messing in [place] today to discuss a anterior hip replacement, but wants to know if Dr Lovell Sheehan will be the referring MD.  Also, does not want Dr. Lovell Sheehan to mention she is diabetic due the fact she does NOT want to have insulin in the hospital. [phone] Initial call taken by: Lynann Beaver CMA,  November 21, 2009 8:52 AM  Follow-up for Phone Call        WE WILL BE REFERRING MD Follow-up by: Willy Eddy, LPN,  November 21, 2009 11:27 AM

## 2010-03-18 NOTE — Progress Notes (Signed)
  Phone Note Call from Patient   Caller: Patient Call For: Stacie Glaze MD Summary of Call: Pain is asking for pain meds to use until her hip surgery.  Wants the "5 level".  Thinks she already has a prescription at home, but not sure if she can get it filled.  Sheliah Plane. [phone] Initial call taken by: [facility] CMA AAMA,  December 11, 2009 10:22 AM    Prescriptions: HYDROCODONE-ACETAMINOPHEN 5-500 MG TABS (HYDROCODONE-ACETAMINOPHEN) one by mouth  two times a day  #60 x 0   Entered by:   Willy Eddy, LPN   Authorized by:   Stacie Glaze MD   Signed by:   Willy Eddy, LPN on 45/40/9811   Method used:   Telephoned to ...       [facility] (retail)       [address]       [place], [place]  [id number]       Ph: [phone] or [phone]       Fax: [phone]   RxID:   [id number]

## 2010-03-18 NOTE — Letter (Signed)
Summary: [facility]   Imported By: Maryln Gottron 04/25/2009 15:54:52  _____________________________________________________________________  External Attachment:    Type:   Image     Comment:   External Document

## 2010-03-20 NOTE — Progress Notes (Signed)
Summary: pt needs to discuss recommend back surgery with Dr Lovell Sheehan  Phone Note Call from Patient Call back at [phone] cell   Caller: Patient Reason for Call: Acute Illness Summary of Call: Pt has been referred to Orthopedic surgeon in [facility] for surgery on spine. Pt is req to speak with Dr. Lovell Sheehan about this and see what he thinks about it. Pls advise.  Initial call taken by: Lucy Antigua,  March 11, 2010 4:46 PM  Follow-up for Phone Call        offered 2 appointments in the next week and could not come- will keep appointment with dr Lovell Sheehan for 2-24 and request referral [facility]-dr Mclain Freer informed Follow-up by: Willy Eddy, LPN,  March 12, 2010 1:50 PM

## 2010-03-20 NOTE — Progress Notes (Signed)
  Phone Note Call from Patient Call back at Home Phone [phone]   Caller: Patient Call For: Stacie Glaze MD Summary of Call: Pt. wants a referral to [facility] for back pain.  Initial call taken by: Debby Meadows CMA AAMA,  March 13, 2010 10:33 AM     Appended Document:  PER DR Dorenda Pfannenstiel- TO [facility] FOR INJECTIONS- PT INFORMED AND REFERRAL SENT TO [facility]

## 2010-03-20 NOTE — Letter (Signed)
Summary: [facility]  [facility]   Imported By: Lanelle Bal 02/07/2010 09:35:09  _____________________________________________________________________  External Attachment:    Type:   Image     Comment:   External Document

## 2010-03-20 NOTE — Progress Notes (Signed)
  Phone Note Call from Patient   Caller: Patient Call For: Stacie Glaze MD Summary of Call: Will Dr. Lovell Sheehan refer her to Dr. Quillian Quince ???(Spinal MD).  Rather go somewhere in town. [phone] Initial call taken by: [facility] CMA AAMA,  March 14, 2010 10:10 AM

## 2010-03-21 ENCOUNTER — Ambulatory Visit
Admission: RE | Admit: 2010-03-21 | Discharge: 2010-03-21 | Disposition: A | Discharge status: Home / Self Care | Payer: Medicare Other | Source: Ambulatory Visit | Attending: Neurosurgery | Admitting: Neurosurgery

## 2010-03-21 DIAGNOSIS — M48061 Spinal stenosis, lumbar region without neurogenic claudication: Secondary | ICD-10-CM

## 2010-03-21 NOTE — Letter (Signed)
[facility]   Imported By: Maryln Gottron 09/04/2009 10:33:59  _____________________________________________________________________  External Attachment:    Type:   Image     Comment:   External Document

## 2010-03-25 ENCOUNTER — Telehealth: Payer: Self-pay | Admitting: *Deleted

## 2010-03-25 DIAGNOSIS — G8929 Other chronic pain: Secondary | ICD-10-CM

## 2010-03-25 DIAGNOSIS — M549 Dorsalgia, unspecified: Secondary | ICD-10-CM

## 2010-03-25 NOTE — Telephone Encounter (Signed)
Pt wants a referral to [facility].  She has had her injection, and the Radiologist said PT would be fine.

## 2010-04-02 ENCOUNTER — Telehealth: Payer: Self-pay | Admitting: Internal Medicine

## 2010-04-02 NOTE — Telephone Encounter (Signed)
Opened in error

## 2010-04-03 ENCOUNTER — Encounter: Payer: Self-pay | Admitting: Internal Medicine

## 2010-04-11 ENCOUNTER — Ambulatory Visit: Payer: Self-pay | Admitting: Internal Medicine

## 2010-04-24 ENCOUNTER — Encounter: Payer: Self-pay | Admitting: Internal Medicine

## 2010-04-24 ENCOUNTER — Ambulatory Visit (INDEPENDENT_AMBULATORY_CARE_PROVIDER_SITE_OTHER): Payer: Medicare Other | Admitting: Internal Medicine

## 2010-04-24 VITALS — BP 110/70 | HR 68 | Temp 98.3°F | Resp 14 | Ht 67.0 in | Wt 174.0 lb

## 2010-04-24 DIAGNOSIS — M1990 Unspecified osteoarthritis, unspecified site: Secondary | ICD-10-CM

## 2010-04-24 DIAGNOSIS — H6120 Impacted cerumen, unspecified ear: Secondary | ICD-10-CM

## 2010-04-24 DIAGNOSIS — E8881 Metabolic syndrome: Secondary | ICD-10-CM

## 2010-04-24 DIAGNOSIS — G4700 Insomnia, unspecified: Secondary | ICD-10-CM

## 2010-04-24 DIAGNOSIS — I10 Essential (primary) hypertension: Secondary | ICD-10-CM

## 2010-04-24 DIAGNOSIS — R300 Dysuria: Secondary | ICD-10-CM

## 2010-04-24 DIAGNOSIS — N390 Urinary tract infection, site not specified: Secondary | ICD-10-CM

## 2010-04-24 LAB — POCT URINALYSIS DIPSTICK
Bilirubin, UA: NEGATIVE
Blood, UA: NEGATIVE
Glucose, UA: NEGATIVE
Nitrite, UA: NEGATIVE
Urobilinogen, UA: 0.2

## 2010-04-24 MED ORDER — NEOMYCIN-POLYMYXIN-HC 3.5-10000-1 OT SUSP
3.0000 [drp] | Freq: Four times a day (QID) | OTIC | Status: AC
Start: 2010-04-24 — End: 2010-05-04

## 2010-04-24 MED ORDER — CIPROFLOXACIN HCL 500 MG PO TABS: 500.0000 mg | ORAL_TABLET | Freq: Two times a day (BID) | ORAL | Status: AC

## 2010-04-24 NOTE — Progress Notes (Signed)
Subjective:    Patient ID: [name], female    DOB: [birth year], 77 y.o.   MRN: [id number]  HPI patient is a 77 year old white female who presents for multiple medical problems including hypertension and is metabolic syndrome acute complaints of back and hip pain which have been treated by injections from the spine Center. And the fact that she is status post a hip resurfacing with good result.  She has additional complaints today for hearing loss bilaterally with a report of cerumen impaction from the ear Center.  She also complains of insomnia inability to shut her thoughts at night failure of Restoril to control her insomnia as it has in the past.      Review of Systems  Constitutional: Negative for activity change, appetite change and fatigue.  HENT: Positive for hearing loss, ear pain and ear discharge. Negative for congestion, neck pain, postnasal drip and sinus pressure.   Eyes: Negative for redness and visual disturbance.  Respiratory: Negative for cough, shortness of breath and wheezing.   Gastrointestinal: Negative for abdominal pain and abdominal distention.  Genitourinary: Negative for dysuria, frequency and menstrual problem.  Musculoskeletal: Positive for back pain, joint swelling, arthralgias and gait problem. Negative for myalgias.  Skin: Negative for rash and wound.  Neurological: Negative for dizziness, weakness and headaches.  Hematological: Negative for adenopathy. Does not bruise/bleed easily.  Psychiatric/Behavioral: Negative for sleep disturbance and decreased concentration.   Past Medical History  Diagnosis Date  . Asthma   . Depression   . Breast cancer   . Hypertension   . Arthritis   . Diverticulosis of colon   . History of colon polyps   . Insomnia    Past Surgical History  Procedure Date  . Cervical fusion   . Cervical laminectomy     x4  . Lumbar laminectomy     with spinal stenosis  . Cholecystectomy   . Total hip arthroplasty   .  Breast lumpectomy   . Manipulation of hip for dislocation   . Tonsillectomy   . Carpal tunnel release     reports that she quit smoking about 20 years ago. She does not have any smokeless tobacco history on file. She reports that she drinks alcohol. She reports that she does not use illicit drugs. family history includes Arthritis in her mother; Cancer in her father; Colon cancer in her father; and Diabetes in her brothers and father. Allergies  Allergen Reactions  . Morphine Sulfate     REACTION: very anxious  . Sulfamethoxazole     REACTION: unspecified       Objective:   Physical Exam  Constitutional: She is oriented to person, place, and time. She appears well-developed and well-nourished. No distress.        Examination of ears reveals bilateral cerumen impaction left greater than right her lavage of her ears there was infection in the left ear and Corticosporin otic drops were prescribed  HENT:  Head: Normocephalic and atraumatic.  Nose: Nose normal.  Mouth/Throat: Oropharynx is clear and moist.  Eyes: Conjunctivae and EOM are normal. Pupils are equal, round, and reactive to light.  Neck: Normal range of motion. Neck supple. No JVD present. No tracheal deviation present. No thyromegaly present.  Cardiovascular: Normal rate, regular rhythm, normal heart sounds and intact distal pulses.   No murmur heard. Pulmonary/Chest: Effort normal and breath sounds normal. She has no wheezes. She exhibits no tenderness.  Abdominal: Soft. Bowel sounds are normal.  Musculoskeletal: She exhibits no  edema and no tenderness.        She is doing well after her hip surgery gait is wide but normal balance is slightly off  Lymphadenopathy:    She has no cervical adenopathy.  Neurological: She is alert and oriented to person, place, and time. She has normal reflexes. No cranial nerve deficit.  Skin: Skin is warm and dry. She is not diaphoretic.  Psychiatric: She has a normal mood and affect. Her  behavior is normal.          Assessment & Plan:

## 2010-04-24 NOTE — Assessment & Plan Note (Signed)
Shots in the Hips by  Dr Patria Mane office with good result

## 2010-04-24 NOTE — Assessment & Plan Note (Signed)
Informed consent was obtained and side gel was inserted into the ears bilaterally using the lavage kit the ears were lavaged until clean inspection with a cerumen spoon to make sure residual wax was not present patient tolerated the procedure well.

## 2010-04-24 NOTE — Assessment & Plan Note (Signed)
Reviewed labs from Dr. Lindwood Coke her blood sugars are well controlled her triglycerides are down 3 diet exercise she appears to be controlling of his metabolic syndrome

## 2010-04-24 NOTE — Assessment & Plan Note (Addendum)
Increased insomnia and unresponsive to restoril which has worked in the past... Dreams seem to be related to stress. Trial of silenor 6 mg samples

## 2010-04-24 NOTE — Assessment & Plan Note (Signed)
Refill benicar 

## 2010-04-24 NOTE — Assessment & Plan Note (Signed)
The pt has noted frequency off and on for several weeks UA positive for leukocytes and negative for rbcs

## 2010-04-25 ENCOUNTER — Telehealth: Payer: Self-pay | Admitting: Internal Medicine

## 2010-04-25 DIAGNOSIS — H938X9 Other specified disorders of ear, unspecified ear: Secondary | ICD-10-CM

## 2010-04-25 NOTE — Telephone Encounter (Signed)
Refer to ENT

## 2010-04-25 NOTE — Telephone Encounter (Signed)
Triage vm--------still has water in her left ear and cannot hear out of it. Any suggestions?

## 2010-04-25 NOTE — Telephone Encounter (Signed)
Any suggestions>?

## 2010-04-25 NOTE — Telephone Encounter (Signed)
TRIAGE VM-----does not think that her ear is infected.But it is getting worse She is having head pressure. Please call before 2:30.

## 2010-04-28 ENCOUNTER — Other Ambulatory Visit: Payer: Self-pay | Admitting: Internal Medicine

## 2010-05-16 ENCOUNTER — Encounter: Payer: Self-pay | Admitting: Internal Medicine

## 2010-05-21 ENCOUNTER — Ambulatory Visit (INDEPENDENT_AMBULATORY_CARE_PROVIDER_SITE_OTHER): Payer: Medicare Other | Admitting: Family Medicine

## 2010-05-21 ENCOUNTER — Encounter: Payer: Self-pay | Admitting: Family Medicine

## 2010-05-21 VITALS — BP 110/80 | HR 94 | Wt 172.0 lb

## 2010-05-21 DIAGNOSIS — H60399 Other infective otitis externa, unspecified ear: Secondary | ICD-10-CM

## 2010-05-21 DIAGNOSIS — H6090 Unspecified otitis externa, unspecified ear: Secondary | ICD-10-CM

## 2010-05-21 MED ORDER — CIPROFLOXACIN-DEXAMETHASONE 0.3-0.1 % OT SUSP: 4.0000 [drp] | Freq: Three times a day (TID) | OTIC | Status: AC

## 2010-05-21 NOTE — Progress Notes (Signed)
  Subjective:    Patient ID: [name], female    DOB: 06/28/1930, 77 y.o.   MRN: [id number]  HPI Here to recheck her left ear. She had her ears irrigated her several weeks ago, and it seemed successful. However some water remained in the left ear, so she saw ENT a few days ago for this. It sounds like they dried out the canal with suction and air that day. It felt better until yesterday when she developed some mild pain in the ear. Hearing is normal.    Review of Systems  Constitutional: Negative.   HENT: Positive for ear pain.   Eyes: Negative.        Objective:   Physical Exam  Constitutional: She appears well-developed and well-nourished.  HENT:  Head: Normocephalic and atraumatic.  Right Ear: External ear normal.  Nose: Nose normal.  Mouth/Throat: No oropharyngeal exudate.       Left external canal is slightly erythematous , TM is clear   Eyes: Conjunctivae are normal. Pupils are equal, round, and reactive to light.  Lymphadenopathy:    She has no cervical adenopathy.          Assessment & Plan:  Follow up prn

## 2010-06-03 ENCOUNTER — Other Ambulatory Visit: Payer: Self-pay | Admitting: *Deleted

## 2010-06-03 DIAGNOSIS — G4700 Insomnia, unspecified: Secondary | ICD-10-CM

## 2010-06-03 MED ORDER — CLONAZEPAM 1 MG PO TABS: 1.0000 mg | ORAL_TABLET | Freq: Every day | ORAL | Status: DC

## 2010-06-03 MED ORDER — TEMAZEPAM 30 MG PO CAPS: 30.0000 mg | ORAL_CAPSULE | Freq: Every evening | ORAL | Status: DC | PRN

## 2010-07-01 NOTE — Op Note (Signed)
[name], [name]                ACCOUNT NO.:  [id number]   MEDICAL RECORD NO.:  [id number]          PATIENT TYPE:  INP   LOCATION:  1605                         FACILITY[facility]  [facility]   PHYSICIAN:  Ollen Gross, M.D.    DATE OF BIRTH:  1930-04-20   DATE OF PROCEDURE:  01/05/2007  DATE OF DISCHARGE:                               OPERATIVE REPORT   PREOPERATIVE DIAGNOSIS:  Recurrent dislocation, right total hip.   POSTOPERATIVE DIAGNOSIS:  Recurrent dislocation, right total hip.   PROCEDURE:  Right hip right acetabular revision and constrained liner.   SURGEON:  Ollen Gross, M.D.   ASSISTANT:  Avel Peace PA-C   ANESTHESIA:  Spinal.   ESTIMATED BLOOD LOSS:  100 mL.   DRAINS:  Hemovac x1.   COMPLICATIONS:  None.   CONDITION:  Stable to recovery.   BRIEF CLINICAL NOTE:  [name] is a 77 year old female who had a total  hip arthroplasty done several years ago.  She has had multiple  dislocations.  She presents now for revision to a constrained liner.   PROCEDURE IN DETAIL:  After the successful administration of spinal  anesthetic, the patient is placed in left lateral decubitus position  with right side up and held with the hip positioner.  Right lower  extremity was isolated from her perineum with plastic drapes and prepped  and draped in the usual sterile fashion.  Short posterolateral incision  is made with a 10 blade through subcutaneous tissue to the level of  fascia lata which was incised in line with the skin incision.  There was  some hematoma present from her recent dislocation.  This was thoroughly  irrigated.  All the posterior soft tissues are off the femur and the  joint was directly visible.  I dislocated the hip and removed the  femoral head.  The femoral component was well fixed and in good  anteversion.  The cup is abducted and slightly beyond normal but the  anteversion looks fine.  I then retracted the femur anteriorly to gain  exposure of the acetabulum.   I removed the acetabular liner and the cup  is well fixed, albeit somewhat abducted.  I was able to clear the soft  tissue from around the rim of the cup and then we impacted the [employer]  50 mm constrained liner.  It is impacted and tested and found to be  locked into the cup.  We then placed a 22 mm +5 head onto the femoral  neck and reduced into the constrained liner.  She had very good motion  with full extension, full external rotation, 70 degrees of flexion, 40  degrees adduction, 70 degrees internal rotation and 90 degrees of  flexion and about 60 degrees internal rotation.  I was also able to get  her flexed up to about 110 degrees.  I then thoroughly irrigated the  joint and reattached the pseudocapsule posteriorly to the femur.  The  fascia lata was closed over Hemovac drain with  interrupted #1 Vicryl, subcu closed with #1-0 and #2-0 Vicryl and  subcuticular running 4-0 Monocryl.  Incision  was cleaned and dried and  Steri-Strips and bulky sterile dressing applied.  The drain was hooked  to suction and she is awakened and transferred to recovery in stable  condition.      Ollen Gross, M.D.  Electronically Signed     FA/MEDQ  D:  01/05/2007  T:  01/06/2007  Job:  [id number]

## 2010-07-01 NOTE — H&P (Signed)
[name], FLAMENCO NO.:  [id number]   MEDICAL RECORD NO.:  [id number]          PATIENT TYPE:  EMS   LOCATION:  ED                           FACILITY:  [facility]   PHYSICIAN:  Alvy Beal, MD    DATE OF BIRTH:  04-16-1930   DATE OF ADMISSION:  06/25/2006  DATE OF DISCHARGE:                              HISTORY & PHYSICAL   Consultation by emergency room of [name], states she is a longtime  patient of Dr. Homero Fellers Aluisio.   HISTORY:  [name] is a very pleasant 77 year old woman who has had a  total hip replacement in the past by Dr. Lequita Halt and had a dislocation  in the past as well.  She presents today after having a postural  indiscretion while at home.  She was reaching for something under her  bed, slipped, heard a pop and then fell to the ground.  She immediately  had significant right hip pain and she was brought to the emergency  room.  She denied any headaches, blurry vision, numbness or loss of  consciousness prior to or following the fall.  Her only complaint at  this point in time is right hip pain.  In the emergency room, x-rays  were done of the hip and leg, which demonstrated the right hip  dislocation but no periprosthetic or distal femur fracture is noted.  As  a result, I was consulted for further evaluation and treatment.   PAST MEDICAL HISTORY:  1. Diabetes.  2. Breast cancer.  3. Hypertension.   SOCIAL HISTORY:  She does no abuse drugs.  She is a non-drinker and non-  smoker.   FAMILY HISTORY:  Is essentially unremarkable.   ALLERGIES:  She has a drug sensitivity/allergy to MORPHINE AND SULFA.   MEDICATIONS:  1. Avalide.  2. __________ .  3. Celebrex.  4. Wellbutrin.  5. Multivitamins.  6. Magnesium.  7. Alpha lipoic acid.  8. Vitamin B6.  9. Calcium.   On clinical exam she is a pleasant who appears her stated age in no  acute distress.  She is alert and oriented x3.  She has intact  peripheral pulses in both lower  extremities.  Capillary refill is less  than 2 seconds in all digits.  EXTREMITIES: She has no ankle or knee pain with palpation or gentle  range of motion.  She has significant right hip pain and a slight  deformity, palpable deformity.  She has no shortness of breath or chest  pain.  ABDOMEN:  Soft and nontender.  HEART:  Regular rate and rhythm. no murmurs, rubs or gallops.  No  shortness of breath.  LUNGS:  Lung fields are clear to auscultation.   At this point in time, the patient has a prosthetic dislocation on the  right side without fracture.  After discussing treatment options, I  think the best course of action is to have her brought to the operating  room where we can provide adequate sedation and muscle relaxation for a  simple and easy and atraumatic reduction.  Following this, I will put  her in a knee immobilizer and depending upon the hour, will either  discharge her to home or admit her overnight for ongoing observation and  to be discharged later.   All appropriate risks, benefits and alternatives were explained to the  patient.  Consent will be obtained.      Alvy Beal, MD  Electronically Signed     DDB/MEDQ  D:  06/25/2006  T:  06/25/2006  Job:  [id number]   cc:   Ollen Gross, M.D.  Fax: [phone]

## 2010-07-01 NOTE — Op Note (Signed)
[name], [name]                ACCOUNT NO.:  [id number]   MEDICAL RECORD NO.:  [id number]          PATIENT TYPE:  OBV   LOCATION:  0098                         FACILITY:  [facility]   PHYSICIAN:  Erasmo Leventhal, M.D.DATE OF BIRTH:  Jun 25, 1930   DATE OF PROCEDURE:  12/22/2006  DATE OF DISCHARGE:                               OPERATIVE REPORT   INDICATION:  [name] is a 77 year old Caucasian female, status post  right total hip arthroplasty several years ago.  Today, while getting  something off the shower shelf, she rotated her foot and had immediate  onset of pain.  She has had a dislocation of the total hip replacement  in May 2008, which required closed reduction and then 2 years ago on  Christmas Eve.  This is her 3rd dislocation.  She presented to [facility] Emergency Room; there, she requested Dr. Lequita Halt.  At this point in  time, Dr. Lequita Halt is unavailable and I attended the patient.  I  evaluated the patient in the holding area.  She has a shortened,  internally rotated, abducted lower extremity.  X-rays were reviewed.  She had an obviously dislocated total hip arthroplasty and her  neurovascular examination was grossly intact.  I discussed the treatment  options with the patient in detail and we discussed closed reduction,  risks, alternatives and benefits and she wished to proceed.   PREOPERATIVE DIAGNOSIS:  Right total hip arthroplasty dislocation.   POSTOPERATIVE DIAGNOSIS:  Right total hip arthroplasty dislocation.   PROCEDURE:  1. Closed reduction of dislocated right total hip arthroplasty.  2. Stress radiography exam under anesthesia.   SURGEON:  Erasmo Leventhal, M.D.   ASSISTANT:  Leilani Able, PA-C.   ANESTHESIA:  General.   ESTIMATED BLOOD LOSS:  None.   COMPLICATIONS:  None.   DISPOSITION:  To PACU, stable.   OPERATIVE DETAILS:  The patient was evaluated in the PACU and counseled.  Dr. Deri Fuelling assistant, Mr. Avel Peace, had discussed  this with her  previously.  She was taken to the operating room, where she was placed  under general anesthesia.  She had a shortened, internally rotated  adducted lower extremity.  Utilizing gentle longitudinal traction,  flexion and rotation, the hip was reduced with palpable reduction.  She  had leg lengths that were symmetric after this.  The hip was put through  a range of motion; she was stable to 90 degrees of flexion and 30  degrees of internal and external rotation.   The C-arm was brought in and confirmed that there were no complicating  factors.  The hip was well reduced and it was stable as the above-noted  exam under anesthesia.   She was placed into a knee immobilizer.  She was awakened and taken from  the operating room to the PACU in stable condition.  No complications or  problems.  She will be stabilized in the PACU and discharged home and  she will follow up in the office next week.  All questions have been  encouraged and answered with the patient and her family.  ______________________________  Erasmo Leventhal, M.D.     RAC/MEDQ  D:  12/22/2006  T:  12/23/2006  Job:  [id number]

## 2010-07-01 NOTE — H&P (Signed)
NAMELAIBA, FUERTE                ACCOUNT NO.:  [id number]   MEDICAL RECORD NO.:  [id number]          PATIENT TYPE:  INP   LOCATION:  NA                           [facility]ACILITY:  [facility]   PHYSICIAN:  Ollen Gross, M.D.    DATE OF BIRTH:  [birth year]   DATE OF ADMISSION:  01/05/2007  DATE OF DISCHARGE:                              HISTORY & PHYSICAL   Date of office visit history and physical January 04, 2007.  Date of  admission January 05, 2007.   CHIEF COMPLAINT:  Recurrent dislocations, right hip.   HISTORY OF PRESENT ILLNESS:  The patient is a 77 year old female well-  known to Dr. Ollen Gross having previously undergone hip surgery in  the past.  She unfortunately has recurrent dislocations of the right hip  requiring closed reductions.  She has had several now and is felt to  have recurrent dislocation instability.  It was felt she would benefit  from undergoing revision with conversion over to a constrained liner.  Risks and benefits have been discussed, and she elected to proceed with  surgery.   ALLERGIES:  SULFA.   INTOLERANCES:  MORPHINE MAKES HER CRAZY.   CURRENT MEDICATIONS:  Aromasin, Wellbutrin, Avapro, Januvia,  multivitamin, calcium plus D, , cinnamon, Restoril, clonazepam albuterol  inhaler.   PAST MEDICAL HISTORY:  1. History of depression.  2. Asthma.  3. Hypertension.  4. History of breast cancer.  5. Postmenopausal.  6. Non-insulin-dependent diabetes mellitus.  7. Degenerative disk disease.   PAST SURGERIES:  1. Neck surgery x4.  2. Right total hip surgery.  3. Left total knee replacement.  4. Gallbladder surgery.  5. Right lumpectomy secondary to breast cancer.   SOCIAL HISTORY:  Married, Counsellor, nonsmoker, two to three  drinks of wine per week.   FAMILY HISTORY:  Colon cancer, diabetes, hypertension.   REVIEW OF SYSTEMS:  GENERAL:  No fevers, chills or night sweats.  NEUROLOGICAL:  No seizures, syncope or paralysis.   RESPIRATORY:  No  shortness breath, productive cough or hemoptysis.  CARDIOVASCULAR:  No  chest pain, angina, orthopnea.  GASTROINTESTINAL:  No nausea, diarrhea  or constipation.  GENITOURINARY:  No dysuria, hematuria or discharge.  MUSCULOSKELETAL:  Right hip.   PHYSICAL EXAMINATION:  VITAL SIGNS:  Pulse 76, respirations 14, blood  pressure 130/78.  GENERAL:  A 77 year old white female well-nourished, well-developed,  overweight, in no acute distress.  She is accompanied by her husband.  HEENT:  Normocephalic, atraumatic.  Pupils round and reactive.  Oropharynx clear.  EOMs intact.  NECK:  Supple.  CHEST:  She does have a slightly end-expiratory wheeze in the left base.  It does improve with coughing.  Otherwise clear.  HEART:  Regular rate and rhythm.  No murmur.  S1-S2 noted.  ABDOMEN:  Soft, round, slightly protuberant.  Bowel sounds present.  RECTAL, BREAST, GENITALIA:  Not done and not pertinent to present  illness.  EXTREMITIES:  Right hip within normal limits.  No pain on passive range  of motion.  Motor intact.   IMPRESSION:  Recurrent dislocations, right total hip.   PLAN:  The patient admitted to [facility] to undergo a right  acetabular revision and conversion over to a constrained liner.  Surgery  is to be performed by Dr. Ollen Gross.      Alexzandrew L. Perkins, P.A.C.      Ollen Gross, M.D.  Electronically Signed    ALP/MEDQ  D:  01/05/2007  T:  01/05/2007  Job:  [id number]   cc:   Stacie Glaze, MD  [address] [place]  [place] [address]   Ollen Gross, M.D.  Fax: [phone]

## 2010-07-01 NOTE — Op Note (Signed)
NAMEGIANNAMARIE, [name]                ACCOUNT NO.:  [id number]   MEDICAL RECORD NO.:  [id number]          PATIENT TYPE:  INP   LOCATION:  0098                         FACILITY:  [facility]   PHYSICIAN:  Alvy Beal, MD    DATE OF BIRTH:  10/08/1930   DATE OF PROCEDURE:  06/25/2006  DATE OF DISCHARGE:                               OPERATIVE REPORT   PREOPERATIVE DIAGNOSIS:  Right total hip prosthesis dislocation.   POSTOPERATIVE DIAGNOSIS:  Right total hip prosthesis dislocation.   OPERATIVE PROCEDURE:  Closed reduction under anesthesia.   COMPLICATIONS:  None.   CONDITION:  Stable.   HISTORY:  This is a very pleasant 77 year old woman who presented today  with a dislocation of her right total hip prosthesis. The patient was  admitted. We planned on doing a closed reduction in the OR. All  appropriate risks, benefits and alternatives were discussed with the  patient, and she consented for surgery.   OPERATIVE NOTE:  The patient was brought to the operating room. After  successful induction of LMA general anesthesia, a gentle reduction  maneuver was performed. The hip was noted to be short and internally  rotated, and so I exaggerated the deformity, applied axial traction and  external rotation and gently reduced the hip. It reduced with a slight  clunk. After the reduction, I was able to freely range the hip, and it  was noted to be stable. There was excessive shuck or instability noted.  I placed her into a knee immobilizer and took intraoperative AP and  lateral hip x-rays. The x-rays demonstrated that she was reduced in both  the AP and lateral planes. At all times at post reduction, her distal  pulses remained intact. She was then extubated and transferred to the  PACU without incident. She will be admitted to the short-stay unit and  discharged to home today as long as he is able to void, ambulate and is  comfortable in terms of her pain.      Alvy Beal, MD  Electronically Signed     DDB/MEDQ  D:  06/25/2006  T:  06/25/2006  Job:  [id number]   cc:   Ollen Gross, M.D.  Fax: [phone]   Alvy Beal, MD  Fax: [phone]

## 2010-07-04 NOTE — Op Note (Signed)
[place]  Patient:    [name], [name] Visit Number: [id number] MRN: [id number]          Service Type: SUR Location: [id number] Attending Physician:  Jacki Cones Dictated by:   Veverly Fells Ophelia Charter, M.D. Proc. Date: 03/23/01 Admit Date:  03/23/2001 Discharge Date: 03/24/2001                             Operative Report  PREOPERATIVE DIAGNOSIS:  C4-5, C5-6, C6-7 spondylosis with left C7 herniated nucleus pulposus.  POSTOPERATIVE DIAGNOSIS:  C4-5, C5-6, C6-7 spondylosis with left C7 herniated nucleus pulposus.  OPERATION PERFORMED:  C4-5, C5-6, C6-7 anterior cervical diskectomy and allograft fusion with anterior plating.  SURGEON:  Mark C. Ophelia Charter, M.D.  ASSISTANT: 1. Colon Flattery. Ollen Bowl, M.D. 2. Zonia Kief, PA-C  ANESTHESIA:  General orotracheal.  ESTIMATED BLOOD LOSS:  100 ml.  DESCRIPTION OF PROCEDURE:  After induction of general anesthesia, orotracheal intubation, sand bag behind the neck.  Head halter traction was applied.  The neck was prepped with DuraPrep.  Preoperative Ancef was given prophylactically.  Standard prepping and draping, Betadine Vi-drape was applied.  Incision was made at the midline, extended to the left a distance of 4 cm following the skin crease.  The platysma was divided in line with the skin fibers.  The platysma was elevated.  Blunt dissection was performed with the carotid sheath and contents lateral.  Omohyoid was mobilized and retracted but not divided.  A large spur at C5-6 was encountered and it was on the left side and fluoroscopy had been draped in the operative field with sterile Mayo stand for the head.  The sterile Mayo was slightly in the way for visualization and during the case, the sterile Mayo stand was removed from under the drapes.  Cross table c-arm showing a lateral of the neck confirmed the appropriate level.  Anterior spurs removed.  C4-5 space was extremely tight with disk space 1 to  1.5 mm.  Due to the large anterior spurring, particularly on the right side, it was elected to proceed to 5-6 first since it was easier to identify the appropriate midline.  Diskectomy was performed at 5-6 TPS drill using a combination of the round bur and the pear shaped bur was used to remove the end plates and undercut for placement of graft.  Once palpation of the midline through the ligament was performed, using a black nerve hook and bone had been thinned down to 2 mm, operative microscope was draped and brought in.  Posterior longitudinal ligament was taken down and dura was exposed.  Both gutters were curetted meticulously.  There was considerable spur overgrowth with the C5 overlying C6 posteriorly and this spur was removed.  Bone was left to prevent posterior migration of the plug. Using the sizers, the wide plug 7 mm tall was selected.  Identical procedure was repeated then at the C6-7 level.  The posterior longitudinal ligament was taken down under microscope and disk material was removed from the left gutter.  There was considerable uncovertebral spurring.  Initially 4-0 curets had to be used in the gutter gradually stripping up to 0 Cloward curets.  A 6 mm graft was placed at C6-7 level.  Finally the 4-5 level was taken down using the smallest curets initially and then the bur.  The posterior bone was ankylosed with less than 1 mm gap.  Bone was removed back to  the posterior longitudinal ligament using the operative microscope and microdissection techniques using the 1 mm Kerrison.  There was no extruded disk at 4-5 and 6 mm graft was tight and was inserted as the other two were with head halter traction applied by the CRNA and lightly tapped in the graft to set it in place.  Grasping the graft with a Kocher, it could not be removed and was tight.  Plate was then selected.  Appropriate size was obtained and a proximal and distal screw right and left was selected, inserted and  checked under fluoroscopy.  Position looked good.  The patients large size and thick shoulders made identification of the inferior aspect of the plate impossible. A second screw was placed proximally and distally.  Plain radiograph was brought in and obtained.  There was slight motion as the arms were pulled down and radiograph was repeated.  The second screw proximally was angled down toward the graft.  It was removed and then redirected into the body of C4 with tight fit.  Additionally one screw was placed in the two remaining holes in the C5 on the left and C6 on the right vertebrae.  The neck was rotated. Grafts were tight.  Fixation was solid and after irrigation with saline solution, Hemovac drain was placed through a separate stab incision.  Platysma repaired with 3-0 Vicryl, 4-0 Vicryl subcuticular skin closure, tincture of benzoin and Steri-Strips, postoperative dressing and soft collar was applied. The patient was transferred to the recovery room in stable condition. Dictated by:   Veverly Fells Ophelia Charter, M.D. Attending Physician:  Jacki Cones DD:  03/24/01 TD:  03/24/01 Job: [id number]

## 2010-07-04 NOTE — Op Note (Signed)
NAME:  [name], [name]                          ACCOUNT NO.:  [id number]   MEDICAL RECORD NO.:  [id number]                   PATIENT TYPE:  AMB   LOCATION:  DAY                                  FACILITY:  [facility]   PHYSICIAN:  Currie Paris, M.D.           DATE OF BIRTH:  [birth year]   DATE OF PROCEDURE:  05/09/2003  DATE OF DISCHARGE:                                 OPERATIVE REPORT   PREOPERATIVE DIAGNOSIS:  Right breast cancer, lower outer quadrant.   POSTOPERATIVE DIAGNOSIS:  Right breast cancer, lower outer quadrant.   OPERATION/PROCEDURE:  Placement of MammoSite radiation catheter.   SURGEON:  Currie Paris, M.D.   ANESTHESIA:  MAC.   CLINICAL HISTORY:  This patient is about 12 days status post right partial  mastectomy for invasive breast cancer in the lower outer quadrant.  After  discussion with the patient, she elected to have a MammoSite placed for her  radiation therapy.   DESCRIPTION OF PROCEDURE:  The patient was seen in the holding area, had no  further questions.  She was taken to the operating room and given IV  sedation.  The breast was prepped and draped.  Initial ultrasound was done  showing we had a very large cavity with about 1.5 cm to almost 2 cm of skin  superficial to the cavity so I thought we had plenty of margin.  However,  the cavity was quite elliptical so after consultation with Dr. Dorna Bloom, we  elected to place an elliptical MammoSite.   The catheter was selected and the balloon inflated to test it.  Unfortunately, the first two MammoSite catheters were not perfectly  symmetric and after discussion with Dr. Dorna Bloom, we elected to use a third one  which was essentially symmetric.  Once that was done, I injected some  Xylocaine laterally so that we could enter it using the trocar to create a  pathway to the lumpectomy site.  Once this was done, I confirmed that we  were definitely in the cavity, both by manually being able to move the  trocar  around and by using ultrasound.   Once that was done, the MammoSite catheter, which was previously tested as  noted above, was threaded easily into the cavity.  Again, its position was  confirmed with ultrasound.  The catheter was inflated with a combination of  saline contrast agent to a total of 65 mL to allow confirmation of the  surrounding tissue.  Using ultrasound, we appeared to have excellent  confirmation and good skin margins.  We checked a few times while we were  loading the balloon catheter to be sure everything was okay.   Once this was done, the catheter was closed off and the sterile dressings  applied.  The patient tolerated the procedure well.  Currie Paris, M.D.    CJS/MEDQ  D:  05/09/2003  T:  05/10/2003  Job:  [id number]   cc:   Deniece Portela C. Dorna Bloom, M.D.  [address] [place]  [place] [address]  Fax: [phone]

## 2010-07-04 NOTE — Discharge Summary (Signed)
NAME:  [name], [name]                          ACCOUNT NO.:  [id number]   MEDICAL RECORD NO.:  [id number]                   PATIENT TYPE:  INP   LOCATION:  0471                                 FACILITY:  [facility]   PHYSICIAN:  Ollen Gross, M.D.                 DATE OF BIRTH:  Sep 16, 1930   DATE OF ADMISSION:  07/11/2003  DATE OF DISCHARGE:  07/15/2003                                 DISCHARGE SUMMARY   ADMITTING DIAGNOSES:  1. Osteoarthritis left knee.  2. History of breast cancer.  3. Asthma.  4. Hypertension.  5. History of previous Staphylococcal infection with previous cervical     surgery.  6. Preoperative urinary tract infection treated with Cipro.   DISCHARGE DIAGNOSES:  1. Osteoarthritis left knee status post left total knee replacement     arthroplasty.  2. Mild postoperative blood loss anemia, did not require transfusion.  3. Mild postoperative hyponatremia.  4. History of breast cancer.  5. Asthma.  6. Hypertension.  7. History of previous Staphylococcal infection, previous cervical surgery.  8. Preoperative urinary tract infection treated with Cipro.   DATE OF SURGERY:  Jul 11, 2003 left total knee arthroplasty.   SURGEON:  Ollen Gross, MD.   ASSISTANT:  Armandina Stammer, PA-C.   ANESTHESIA:  Spinal.   ESTIMATED BLOOD LOSS:  Minimal.   DRAINS:  Hemovac drain times one.   TOURNIQUET TIME:  47 minutes at 300 mmHg.   CONSULTS:  [facility], Rene Paci, MD.   LABORATORY DATA:  CBC on admission -- hemoglobin of 14.3, hematocrit 42.7,  white cell count 3.9, red cell count 4.48, differential within normal limits  with the exception of elevated monocytes at 12.  Postop H&H 12.3 and 36.5,  last known H&H 10.6 and 30.8.  PT/PTT preoperative 12.1 and 27,  respectively, and INR 0.9, serial protimes to follow, last known PT/INR 17.4  and 1.7.  Chem panel on admission all within normal limits.  Postop her  followup BMET -- sodium dropped from  140 to 134.  Preop UA -- large  leukocyte esterase, many epithelial cells, 3 to 6 white cells, but this was  treated preoperatively.  Blood group type B negative.  Chest x-ray two view  Jul 13, 2003 -- no active disease.  CT chest Jul 13, 2003 -- no evidence of  pulmonary embolism, no acute cardiopulmonary disease, an approximately 5 cm  right breast cyst.   HOSPITAL COURSE:  The patient was admitted to [facility] and taken  to the OR and underwent the above stated procedure without complication.  The patient tolerated the procedure well and was later transferred to the  recovery room, and then to the orthopedic floor for continued postop care,  given 24 hours postop IV Ancef, started on Lovenox and Coumadin.  PT and OT  were consulted postop for gait training, ambulation, ADLs, started weight-  bearing as  tolerated.  Hemovac drain which was placed at the time of surgery  was pulled on postop day one.  The patient did have a fairly rough night,  fluids were KVO on day one, by day two she was doing a little bit better,  temp was up last night to 103.3, it was back down to 99.3 on day two.  She  was doing a little bit better with her pain control, PCA and IV's and  Foley's were discontinued.  She had a little bit of abdominal distention and  was requesting some Gas-X, also suppository.  Started getting her up out of  bed more with physical therapy.  She actually was slow to progress with  physical therapy, only ambulating approximately 8 feet that morning of day  two, but she did a little bit better that afternoon of 50 feet, by the next  day she was ambulating 60 feet on day three.  Dressing changes initiated on  postop day two and her incision was healing well.  By day three she was  doing a little bit better.  She did have a little bit of drop in her O2 sat.  The PA on-call ordered a CT scan, CT was ordered, CT results as above and  was negative for PE.  A chest x-ray was also  ordered which was negative.  She was doing a little bit better that next morning with no complaints.  She  was feeling much better that next day with no complaints of chest pain or  shortness of breath.  She continued to do well and by postop day four she  was doing well with no complaints, had progressed with physical therapy and  wanted to go home.   DISCHARGE PLAN:  The patient was discharged home on Jul 15, 2003.   DISCHARGE DIAGNOSES:  Please see above.   DISCHARGE MEDICATIONS:  1. Coumadin.  2. Percocet.  3. Robaxin.   DISCHARGE DIET:  As tolerated.   ACTIVITY:  Weight-bearing as tolerated, home occupational therapy, physical  therapy, home health nursing.  May start showering.  Daily dressing changes.   FOLLOWUP:  Follow up two weeks from date of surgery.   DISPOSITION:  Home.   CONDITION ON DISCHARGE:  Improved.     Alexzandrew L. Julien Girt, P.A.              Ollen Gross, M.D.    ALP/MEDQ  D:  08/22/2003  T:  08/22/2003  Job:  [id number]   cc:   Ollen Gross, M.D.  [facility]  [address]  [address]  [place]  [place] [address]  Fax: [phone]   Stacie Glaze, M.D. [facility]

## 2010-07-04 NOTE — Discharge Summary (Signed)
NAME:  [name], [name]                          ACCOUNT NO.:  [id number]   MEDICAL RECORD NO.:  [id number]                   PATIENT TYPE:  INP   LOCATION:  5020                                 FACILITY:  [facility]   PHYSICIAN:  Mark C. Ophelia Charter, M.D.                 DATE OF BIRTH:  04-29-1930   DATE OF ADMISSION:  09/26/2001  DATE OF DISCHARGE:  09/28/2001                                 DISCHARGE SUMMARY   FINAL DIAGNOSIS:  Superficial postoperative infection from posterior  cervical spine fusion.   PROCEDURES:  Exploration, debridement, and placement of methylmethacrylate,  tobramycin beads on 09/26/2001.   HISTORY OF PRESENT ILLNESS:  This 77 year old female with severe cervical  spondylosis and anterior cervical spine fusion, 3 level with decompression  and plating last year.  One __________ healed __________ , but there was  obvious motion at one of the levels and suggested motion at the other.  Postop MRI showed no areas of residual compression.  Due to the continued  motion on pain with some small lucent lines around the screw, she is brought  in for posterior fusion for stabilization before she has failure of her  hardware.   LABORATORY DATA:  Admission labs showed an EKG with occasional PACs.  CBC  was normal and white blood count was remarkable in that it was 3.6 thousand  with normal differential.  Sedimentation rate on admission was 11 and her C-  reactive protein was 6.9 on 09/27/01 which was postop, and her C-reactive  high sensitivity was 26.1 preop on the 11th.  I do not have her regular C-  reactive protein from preop.  In any event C-reactive protein was elevated.   The patient had been on some antibiotics previously when her incision was  noted to be slightly red 1 month postop.  At that time she had no drainage.  She was afebrile.  X-rays looked good.  She was put on antibiotics for 10  days with improvement in her symptoms of some neck pain and seemed to be  progressing well over the following month when in August she had significant  increase in the erythema around her incision with some puffiness in her  posterior neck, and an MRI scan was obtained last week, just prior to her  admission, which demonstrated fluid collection in the subcutaneous area and  muscle.  This did not extend past the spinous processes and the bone graft  and wiring appeared normal on the MRI scan.   HOSPITAL COURSE:  After informed consent, with patient not receiving any  preoperative antibiotics, she was taken to the operating room and the  posterior neck was explored.  A small amount of clear fluid was found in a  small sac with no purulent material found, no necrotic tissue, no necrotic  muscle.  Muscle tissue looked good.  Multiple tissue cultures, aerobic, and  anaerobic cultures  were all obtained.  Gram stain.  In 24 hours everything  was negative.  Intraoperative she received the culturing.  She received  combination of both Ancef and IV vancomycin.  A string of tobramycin beads  PMMA were then placed, and the tail was left at the inferior aspect of the  wound for later removal 1 week postop in the office.  Postop infectious  disease consultation was obtained.  It was felt that if all cultures and C-  reactive proteins were normal then no antibiotics were necessary per Dr.  Maurice March.  C-reactive protein was indeed elevated and with cultures negative at  24 and 48 hours it was elected to stop her Ancef and send her home on 6  weeks of p.o. Keflex pending any changes in her culture.  There is no change  in the appearance of her incision which appeared with mild erythema for 2-3  cm on each side of the incision.  She did have considerable drainage as  expected after placement of the methylmethacrylate beads.  She was  discharged on p.o. pain medicines as well as 500 mg of Keflex p.o. b.i.d.  times 6 weeks.  Asked to followup in 1 week.  It was discussed with   infectious disease prior to discharge appropriate antibiotic treatment.  If  changes in cultures or sensitivities develop, as the cultures are  followed, then appropriate antibiotic changes will be necessary.  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  The  patient was discharged in satisfactory condition.                                               Mark C. Ophelia Charter, M.D.    MCY/MEDQ  D:  10/21/2001  T:  10/24/2001  Job:  [id number]

## 2010-07-04 NOTE — H&P (Signed)
NAME:  ILYSE, TREMAIN                          ACCOUNT NO.:  [id number]   MEDICAL RECORD NO.:  [id number]                   PATIENT TYPE:  INP   LOCATION:  0471                                 FACILITY:  [facility]   PHYSICIAN:  Ollen Gross, M.D.                 DATE OF BIRTH:  29-May-1930   DATE OF ADMISSION:  07/11/2003  DATE OF DISCHARGE:                                HISTORY & PHYSICAL   DATE OF OFFICE VISIT/HISTORY & PHYSICAL:  Jul 03, 2003.   CHIEF COMPLAINT:  Left knee pain.   HISTORY OF PRESENT ILLNESS:  The patient is a 77 year old female with  ongoing  left knee pain. 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 The pain has been  ongoing for several years now. It has caused functional incapacity and  constant pain. She is seen in the office by Dr. Lequita Halt and found to have  severe tricompartmental end-stage degenerative changes on the left with bone-  on-bone in all compartments and slight deformity. It was felt that she has  reached the point where she would benefit from ongoing surgical  intervention. The risks and benefits were discussed and the patient has  elected to proceed with surgery.   ALLERGIES:  SULFA caused a bad reaction in childhood. MORPHINE does not  help.   CURRENT MEDICATIONS:  ______aramycin, Celexa, Singular, Avapro, Voltaren,  Restoril, glucosamine, aspirin, hydrocodone.   PAST MEDICAL HISTORY:  1. History of breast cancer.  2. Asthma.  3. Hypertension.  4. Previous staph infection from previous neck surgery.   PAST SURGICAL HISTORY:  1. Right total hip arthroplasty.  2. Cervical surgery times three.  3. Breast lumpectomy.   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   FAMILY HISTORY:  Father with history of diabetes and colon cancer. She has  two brothers both with diabetes. Cervical disease and also overall  degenerative joint disease runs in her family.   REVIEW OF SYSTEMS:  GENERAL: No fever, chills, night sweats. NEUROLOGIC: No  seizure, syncope, or paralysis. RESPIRATORY:  No shortness of breath,  productive cough, or hemoptysis. CARDIOVASCULAR: No chest pain, angina, or  orthopnea. GI: No nausea, vomiting, diarrhea, or constipation. GU: No  dysuria, hematuria, or discharge. MUSCULOSKELETAL: Pertinent to that of the  knee found in the history of present illness.   PHYSICAL EXAMINATION:  VITAL SIGNS: Pulse 72, respirations 16, blood  pressure 130/86.  GENERAL: A 77 year old white female, well-developed, well-nourished, in no  acute distress, overweight; alert, oriented, and cooperative; very pleasant  at the time of examination. Appears to be an excellent historian.  HEENT:  Normocephalic and atraumatic. Pupils are round and reactive. EOMs  are intact.  NECK: Supple. No carotid bruits.  CHEST: Clear to auscultation in the anterior and posterior chest wall. No  rales or rhonchi.  HEART: Regular rate and rhythm.  ABDOMEN: Soft, nontender. Bowel sounds are present.  RECTAL/BREASTS/GENITALIA: Not done; not pertinent to the present illness.  EXTREMITIES: Significant to the left knee. She does have a varus deformity  on malalignment. Range of motion of 5-100 degrees, marked crepitus noted.  There is no obvious instability or effusion.   IMPRESSION:  1. Osteoarthritis, left knee.  2. History of breast cancer.  3. Asthma.  4. Hypertension.  5. History of previous staphylococcal infection with previous cervical     surgery.  6. Preoperative urinary tract infection treated with Cipro.   PLAN:  The patient will be admitted to [facility] and undergo left  total knee arthroplasty. The surgery will be performed by Dr. Ollen Gross.     Alexzandrew L. Julien Girt, P.A.              Ollen Gross, M.D.    ALP/MEDQ  D:  07/12/2003  T:  07/12/2003  Job:  [id number]   cc:   Ollen Gross,  M.D.  [facility]  [address]  [address]  [place]  [place] [address]  Fax: [phone]   Stacie Glaze, M.D. [facility]

## 2010-07-04 NOTE — Op Note (Signed)
[place]  Patient:    [name], [name] Visit Number: [id number] MRN: [id number]          Service Type: SUR Location: [id number] Attending Physician:  Jacki Cones Dictated by:   Veverly Fells Ophelia Charter, M.D. Proc. Date: 07/15/01 Admit Date:  07/15/2001 Discharge Date: 07/16/2001                             Operative Report  PREOPERATIVE DIAGNOSIS:  Pseudoarthrosis, C5-6, C6-7, status post anterior cervical diskectomy and plating.  POSTOPERATIVE DIAGNOSIS:  Pseudoarthrosis, C5-6, C6-7, status post anterior cervical diskectomy and plating.  OPERATION PERFORMED:  C4 to C7 (three-level) posterior cervical fusion with wiring.  Right posterior iliac crest bone graft.  SURGEON:  Mark C. Ophelia Charter, M.D.  ASSISTANT:  Colon Flattery. Ollen Bowl, M.D.  ANESTHESIA:  General orotracheal.  Skin Marcaine 0.25% analgesia.  ESTIMATED BLOOD LOSS:  350 cc.  DRAINS:  None.  INDICATIONS FOR PROCEDURE:  This 77 year old female had severe hypertrophic spondylosis with multilevel stenosis and disk herniation.  She had an anterior cervical diskectomy at 4-5, 5-6 and C6-7 with allograft bone placement and anterior cervical plating.  Synthes plates were used and the patient has had some lucent changes around the screw in C7 with some persistent motion at C6-7 despite postoperative immobilization and good patient compliance.  DESCRIPTION OF PROCEDURE:  After induction of anesthesia, the patient was placed in the prone position on a head horseshoe ring with careful padding. Eyes were checked and arms were tucked at the side initially.  There were some problems with securing the arm and a sled was placed on the left.  Skin was taped down on the neck and upper thoracic region and taped to the bed leaving the right posterior  iliac crest exposed.  Preoperative Ancef was given.  A small amount of hair was shaved at the occiput.  DuraPrep was used to both areas.  It was squared  with towels.  Betadine Vi-drape applied after sterile skin marker. The patient had an old posterior cervical incision which was in the midline from C7 to C5 and then went off slightly obliquely off to the right side and this old incision was followed.  A sterile Mayo stand was placed at the head and thyroid sheet and drapes were applied.  Incision was made following the old scar that had been marked and subcutaneous tissue was sharply dissected with the Bovie electrocautery and dissection was followed trying to stay exactly in the midline.  Subperiosteal dissection onto the lamina and spinous processes was performed.  There was motion at C6-7 and also at 5-6.  4-5 was solid and 4 and 5 spinous processes were almost touching each other.  Small bleeders were controlled with bipolar cautery.  Lamina was well visualized.  Self-retaining retractor was placed. Multiple x-rays were taken, I think a total of 5 trying to get decent visualization due to the patients obesity, large broad shoulders and in this position shortness of the neck.  On none of the x-rays was the anterior cervical plate visualized.  However, by counting the posterior spinous processes from C2 down, the correct levels were identified.  4-5 was solidly fused and due to the proximity of spinous process 4 and 5, it was elected to run the wire around the spinous process of C4.  A small notch was made and a 22 gauge wire was placed on a hand drill, twisted  into a cable, bent, passed underneath the base of C7 around the notch in the spinous process of C4 and then tightened down twisting with the heavy needle driver bending the wire over and clipping it.  The wire was tested and was extremely tight.  Spinous process was intact.  Decortication was performed with the TPS drill.  Iliac crest was exposed on the right and corticocancellous strips were obtained outer table only and a large amount of cancellous bone  was harvested with  a large curet.  Cancellous bone was placed down on the denuded areas of the lamina and spinous process mostly between C6-7 and C5-6. Corticocancellous strips were placed upon the top and this was fingerpacked down.  Copious irrigation was used throughout the case in the iliac crest and also in the neck.  The fascia of the neck was closed with 0 Vicryl and 2-0 Vicryl in the subcutaneous tissue skin staple closure.  Iliac crest was closed with same 0 Vicryl deep fascia, 2-0 Vicryl subcutaneous and skin staple closure, Marcaine in both areas.  Patient was transferred back to the supine position, extubated with soft cervical collar applied and was neurologically intact upper and lower extremities in the recovery room.  Instrument and needle counts were correct. Dictated by:   Veverly Fells Ophelia Charter, M.D. Attending Physician:  Jacki Cones DD:  07/15/01 TD:  07/18/01 Job: [id number]

## 2010-07-04 NOTE — Op Note (Signed)
NAME:  [name], [name]                          ACCOUNT NO.:  [id number]   MEDICAL RECORD NO.:  [id number]                   PATIENT TYPE:  INP   LOCATION:  NA                                   FACILITY:  [facility]   PHYSICIAN:  Ollen Gross, M.D.                 DATE OF BIRTH:  [birth year]   DATE OF PROCEDURE:  07/11/2003  DATE OF DISCHARGE:                                 OPERATIVE REPORT   PREOPERATIVE DIAGNOSIS:  Osteoarthritis, left knee.   POSTOPERATIVE DIAGNOSIS:  Osteoarthritis, left knee.   PROCEDURE:  Left total knee arthroplasty.   SURGEON:  Ollen Gross, M.D.   ASSISTANT:  Alexzandrew L. Julien Girt, P.A.   ANESTHESIA:  Spinal.   ESTIMATED BLOOD LOSS:  Minimal.   DRAINS:  Hemovac x1.   COMPLICATIONS:  None.   TOURNIQUET TIME:  47 minutes at 300 mmHg.   CONDITION:  Stable to recovery.   CLINICAL NOTE:  [name] is a 77 year old female with severe end-stage  osteoarthritis of the left knee with pain refractory to nonoperative  management.  She presents now for left total knee arthroplasty.   PROCEDURE IN DETAIL:  After the successful administration of spinal  anesthetic, the tourniquet is placed on the left thigh, and the left lower  quadrant is prepped and draped in the usual sterile fashion.  The extremely  is prepped and esmarched.  The tourniquet is inflated to 300 mmHg.  A  midline incision is made with a 10 blade in the subcutaneous tissue to the  level of the extensor mechanism.  A fresh blade is used to make a medial  parapatellar arthrotomy.  The soft tissue over the proximal and medial  tibial is subperiosteally elevated to the joint line with a knife and into  the semimembranous bursa with a curved osteotome.  The soft tissue over the  proximal and lateral tibia is also elevated with attention being paid to  avoiding the patellar tendon on the tibial tubercle.  The patella is  subluxed laterally.  The knee is flexed, and the ACL and PCL removed.  The  drill is used to create a starting hole in the distal femur.  The canal is  irrigated.  A 5 degree left valgus alignment guide is placed and referencing  off the posterior condyles rotation is marked and a block pinned to remove  10 mm up the distal femur.  Distal femoral resection is made with an  oscillating saw.  The sizing block is then placed, and a size 4 is most  appropriate for the femur.  The rotation is marked up the epicondylar axis  and then the AP cutting block placed for a size 4, and the anterior and  posterior cuts made.   The tibia is subluxed forward, and the menisci are removed.  Extramedullary  tibial alignment guides are placed, referencing proximally at the medial  aspect of the  tibial tubercle and distally on the second metatarsal axis on  the tibial crest.  Block is pinned to remove 10 mm of nondeficient lateral  side.  The tibial resection is made with an oscillating saw.  The size 3 is  the more appropriate tibial size.  The proximal tibia is then prepared with  the modular drill and keel punched for a size 3.  Femoral preparation is  completed with the intercondylar and chamfer cuts.   The size 3 mobile-bearing tibial trial and size 4 posterior stabilized  femoral trial and a 10 mm posterior stabilizing rotating platform insert  trial were placed with the 10 in slight hyperflexion.  We went to a 12.5,  which allowed for full extension with excellent varus and valgus balance  throughout with full range of motion.  The patella was then everted.  The  thickness was measured to be 22 mm.  Free hand resection taken to 13 mm.  A  38 template placed.  Lug holes drilled.  A trial patella placed.  It tracks  normally.  The osteophytes were then removed up the posterior femur with the  trial in place.  All trials were then removed, and cut bone surfaces were  prepared with pulsatile lavage.  Cement is mixed and was ready for  implantation.  The size 4 posterior stabilized  femur, size 3 mobile-bearing  tibial tray, and 38 patella were all cemented into place.  The patella was  held with a clamp.  Once the cement was fully hardened, the permanent 12.5  mm rotating platform posterior stabilizer insert was placed into the tibial  tray.  The wound was copiously irrigated with antibiotic solution.  The  extensor mechanism was closed over a Hemovac drain with interrupted #1 PDS.  Flexion against gravity was approximately 130 degrees.  Tourniquets were  released for a total time of 47 minutes.  The subcu was closed with  interrupted 2-0 Vicryl and subcuticular running 4-0 Monocryl.  The incisions  were cleaned and dried, and Steri-Strips and a bulky sterile dressing  applied.  She was then awakened and transported to the recovery room in  stable condition.                                               Ollen Gross, M.D.    FA/MEDQ  D:  07/11/2003  T:  07/11/2003  Job:  [id number]

## 2010-07-04 NOTE — Op Note (Signed)
NAME:  [name], [name]                          ACCOUNT NO.:  [id number]   MEDICAL RECORD NO.:  [id number]                   PATIENT TYPE:  INP   LOCATION:  5020                                 FACILITY:  [facility]   PHYSICIAN:  Annell Greening, M.D.                    DATE OF BIRTH:  January 13, 1931   DATE OF PROCEDURE:  09/26/2001  DATE OF DISCHARGE:  09/28/2001                                 OPERATIVE REPORT   PREOPERATIVE DIAGNOSES:  Infected postoperative cervical fusion.   POSTOPERATIVE DIAGNOSES:  Infected postoperative cervical fusion.   OPERATION PERFORMED:  Exploration, debridement  and placement of Tobramycin  methyl methacrylate beads, posterior cervical spine.   SURGEON:  Annell Greening, M.D.   ASSISTANT:  Genene Churn. Denton Meek.   ANESTHESIA:  General orotracheal anesthesia.   ESTIMATED BLOOD LOSS:  200 ml.   DESCRIPTION OF PROCEDURE:  After induction of general anesthesia, the  patient was placed in the prone position.  No antibiotics were given  preoperatively.  The occiput was shaved slightly with the razor and the area  was prepped with DuraPrep.  The usual towels were used to square off the  area and half sheet below thyroid sheet over the incision after Betadine Vi-  drape application.  The old incision was opened.  Subcutaneous tissue  appeared normal and small bleeders were coagulated with forceps and cautery.  Sharp dissection with a scalpel was performed deeper and a sac was  encountered which was 2 to 3 cm in width and 6 to 8 cm in length.  It then  extended down to the top of the spinous process of C5 and C6 and had a  thinned filmy layer covering over the spinous process.  There was no fluid  present inside the bursal type sac and multiple cultures were obtained  including aerobic and anaerobic as well as tissue cultures.  Dissection was  performed laterally and finger dissection and there was no extension of the  sac in any direction.  The sac was excised using a  combination of sharp  dissection with rongeur and using a large curet.  Once the bursal sac had  been totally removed and a portion of the bursal sac had been sent for  culture, pulsatile lavage was performed using a bulb syringe and using a  combination of saline and then bacitracin solution.  Muscle on both the  right and left side was red in appearance.  Appeared normal.  There was no  tissue necrosis.  No purulent material and all tissue looked healthy.  No  sutures were present from the previous operation and she had been closed  with Vicryl sutures.  Once the bursal sac was totally removed, a towel clip  was placed in the spinous process and there was absolutely no motion.  Fibrous tissue and muscle was covering  the lamina on both sides and  there  was no subperiosteal stripping performed.  Palpation, pushing, probing and  curetting did not reveal any further extension to the sac and MRI scan  showed that the sac and abnormal signal was also present superficial to the  spinous process.  After repeat irrigation, some 2-0 Vicryl  was used to  reapproximate the subcutaneous tissues after Tobramycin 1.2 gm was mixed  with one batch of cement powder and then the monomer added.  Antibiotic  beads were rolled on a #5 Ethibond suture.  After cutting off the needles,  the two ends were knotted for identification and a total of 14 beads were  placed on the Ethibond suture and then carefully placed into the wound.  The  tip of the suture exited out the inferior aspect and skin stapler used for  skin closure followed by postoperative dressing.  Instrument  count and needle count was correct.  Plan is for bead removal in the office  one week postoperative and antibiotics based on cultures.  Once the cultures  are obtained intraop, a gram of vancomycin was given as well as a gram of  Ancef.                                                Annell Greening, M.D.    MY/MEDQ  D:  09/26/2001  T:   09/28/2001  Job:  [id number]

## 2010-07-04 NOTE — Discharge Summary (Signed)
NAMESHERISA, GILVIN                ACCOUNT NO.:  [id number]   MEDICAL RECORD NO.:  [id number]          PATIENT TYPE:  INP   LOCATION:  1507                         FACILITY:  [facility]   PHYSICIAN:  Ollen Gross, M.D.    DATE OF BIRTH:  20-Apr-1930   DATE OF ADMISSION:  01/27/2006  DATE OF DISCHARGE:  01/30/2006                               DISCHARGE SUMMARY   ADMISSION DIAGNOSIS:  1. Osteoarthritis, right knee.  2. History of breast cancer.  3. Asthma.  4. Hypertension.  5. History of previous Staphylococcal infection with previous cervical      surgery.   DISCHARGE DIAGNOSIS:  1. Osteoarthritis, right knee, status post right total knee      arthroplasty.  2. Mild acute blood loss anemia, did not require transfusion.  3. History of breast cancer.  4. Asthma.  5. Hypertension.  6. History of previous Staphylococcal infection with previous cervical      surgery.   PROCEDURE:  January 27, 2006, right total knee, surgeon Dr. Ollen Gross, assistant Alexzandrew Julien Girt, P.A.-C., anesthesia spinal.   CONSULTATIONS:  None.   BRIEF HISTORY:  [name] is a 77 year old female with end stage  arthritis of the right knee with intractable pain, previous successful  left total knee, now presents for right total knee.   LABORATORY DATA:  Preop CBC showed hemoglobin 14.6, hematocrit 42.7,  white blood cell count 6.6, postop hemoglobin 11.9, drifting down to  11.2, last H&H 10.5 and 30.7.  PT and PTT preop 13.9 and 32  respectively.  INR 1.1.  Serial protimes were followed, last PT/INR 17.8  and 1.4.  Chem panel on admission within normal limits.  Electrolytes  remained within normal limits, glucose went from 96 to 144 and then back  down to 129.  Blood type B negative.   EKG January 15, 2006, normal sinus rhythm, low voltage QRS, when  compared to October 29, 2004, limb leads now corrected, confirmed by  Dr. Peter Swaziland.  Two view chest January 15, 2006, no acute  cardiopulmonary disease.   HOSPITAL COURSE:  The patient was admitted to [facility],  tolerated her procedure well, later transferred to the recovery room and  then the orthopedic floor.  She was started on PCA and p.o. analgesics  for pain control after surgery following surgery, given postop  antibiotics.  Seen in rounds on the morning of day one by Dr. Lequita Halt,  actually doing pretty well following surgery, hemoglobin 11.9.  Hemovac  drain placed during the surgery was pulled without difficulty.  She  started getting up out of bed.  Encouraged to take Percocet.  She had a  little bit of disorientation and confusion on the evening and night of  day one, felt it was due to the PCA, the PCA was discontinued.  She had  a rough night the night of day one, doing a little bit better on the  morning of day two.  Temperature went up, also, during that time to 102.  She was treated with Tylenol and incentive spirometers and this went  back down  to 99.  She was using her inhalers.  She started getting up  with PT by day two.  Actually, she did a little better, she got up about  15 feet several times.  It was already noted on day two that she is  already able to do straight leg raises so we discontinued knee  immobilizer.  The dressings were changed, the incision looked good.  It  is felt due to the fact that she was improving that she would be ready  to go home in the next day or so.  By the following day, January 30, 2006, she was doing much better with her therapy, meeting her goals.  She had been weaned over to p.o. meds.  The intermittent disorientation  with the PCA had resolved and had not come back.  She felt well and was  ready to go home.   DISCHARGE DISPOSITION:  The patient is discharged home on January 30, 2006.   DISCHARGE MEDICATIONS:  Percocet, Robaxin, and Coumadin.   DISCHARGE INSTRUCTIONS:  Diet as tolerated.  Activities weight-bearing  as tolerated right  lower extremity, home health physical therapy and  nursing, total knee protocol.  Follow up in two weeks.   CONDITION ON DISCHARGE:  Improved.      Alexzandrew L. Julien Girt, P.A.      Ollen Gross, M.D.  Electronically Signed    ALP/MEDQ  D:  03/30/2006  T:  03/30/2006  Job:  [id number]   cc:   Stacie Glaze, MD  [address] [place]  [place] [address]

## 2010-07-04 NOTE — Op Note (Signed)
NAMEJASLYN, [name]                ACCOUNT NO.:  [id number]   MEDICAL RECORD NO.:  [id number]          PATIENT TYPE:  OBV   LOCATION:  0101                         FACILITY:  [facility]   PHYSICIAN:  Vanita Panda. Magnus Ivan, M.D.DATE OF BIRTH:  [birth year]   DATE OF PROCEDURE:  02/09/2004  DATE OF DISCHARGE:                                 OPERATIVE REPORT   PREOPERATIVE DIAGNOSIS:  Right prostatic hip dislocation (posterior).   PROCEDURE:  Open reduction under general anesthesia of right posterior hip  dislocation.   SURGEON:  Vanita Panda. Magnus Ivan, M.D.   ANESTHESIA:  General.   COMPLICATIONS:  None.   DISPOSITION:  To the PACU in stable condition.   INDICATIONS:  [name] is a 77 year old female, who had a right total hip  replacement about 5 years ago.  She has had no hip pain, has been doing well  with her double hip.  But this evening, she was leaning forward taking off  stockings and putting on her pajamas when she felt a pop in her hip and had  the immediate inability to ambulate.  She was transported via EMS to the  emergency room.  X-rays were obtained, and she was found to have a posterior  hip prosthetic dislocation.  There was no evidence of fracture.  It was  decided closed reduction should be performed under general anesthesia.  The  risks and benefits were explained to her and her family, and she agreed to  proceed to operating room.   DESCRIPTION OF PROCEDURE:  After informed consent was obtained, she was  brought to the operating room and general anesthesia was obtained.  She was  then placed supine on the operating table.  The leg was found to be in a  shortened internal rotated position and under fluoroscopy, was obviously  dislocated.  The hip was put through a range of motion under fluoroscopic  guidance.  Pressure was then placed in a downward direction over the  patient's pelvis by two assistants, and another assistant felt the femoral  head as the surgeon  put the hip in a flexed and internally rotated position.  After longitudinal direction and flex direction after several minutes, the  hip was then relocated.  This put through a range of motion and found to be  stable.  Fluoroscopic views of the hip in the operating room showed the  concentric reduction without evidence of fracture.  The hip was put through  a range of motion under fluoroscopic guidance and likewise found to be  located.  Clinically, the legs were equal lengths, rotation, and resting  position.  An abduction pillow was placed between her legs.  She was  awakened, extubated, and taken to the recovery room in stable condition.      CYB/MEDQ  D:  02/10/2004  T:  02/10/2004  Job:  [id number]

## 2010-07-04 NOTE — Op Note (Signed)
[name], [name]                ACCOUNT NO.:  [id number]   MEDICAL RECORD NO.:  [id number]          PATIENT TYPE:  AMB   LOCATION:  SDS                          FACILITY:  [facility]   PHYSICIAN:  Henry A. Pool, M.D.    DATE OF BIRTH:  September 27, 1930   DATE OF PROCEDURE:  10/31/2004  DATE OF DISCHARGE:                                 OPERATIVE REPORT   PREOPERATIVE DIAGNOSIS:  Left L5-S1 stenosis with superimposed L5-S1  herniated nucleus pulposus with radiculopathy.   POSTOPERATIVE DIAGNOSIS:  Left L5-S1 stenosis with superimposed L5-S1  herniated nucleus pulposus with radiculopathy.   PROCEDURE:  Left L5-S1 decompressive laminotomy with foraminotomy and left  L5-S1 microdiskectomy.   SURGEON:  Kathaleen Maser. Pool, M.D.   ASSISTANT:  Reinaldo Meeker, M.D.   ANESTHESIA:  General oral endotracheal.   INDICATIONS:  [name] is a 77 year old female with a history of back and  left lower extremity pain consistent with a left-sided S1 radiculopathy.  Workup demonstrates evidence of significant spondylosis with stenosis off to  the left side at L5-S1.  There is a superimposed L5-S1 disk herniation  further complicating this.  We have discussed the options over management,  including the possibility of doing a left-sided L5-S1 laminotomy and  microdiskectomy in hopes of improving her symptoms.  We discussed the risks  and benefits and the patient wishes to proceed.   DESCRIPTION OF PROCEDURE:  The patient was taken to the operating room and  placed on the operating table in the supine position.  After an adequate  level of anesthesia was achieved, the patient was turned prone onto a Wilson  frame and appropriately padded for lumbar surgery and prepped and draped in  a sterile fashion.  A 10 blade skin incision overlying the L5-S1 interspace  was made.  This was carried down sharply in the midline.  Subperiosteal  dissection was performed at the facet joints at L5 and S1 on the left side.  Deep self-retaining retractor was placed, an intraoperative x-ray was taken  and the level was confirmed.  A laminotomy was then performed using a high-  speed drill and Kerrison rongeurs, removing the inferior aspect of the  lamina of L5, the medial aspect of the L5-S1 facet joint and superior rim of  the S1 lamina.  The ligamentum flavum was then elevated and resected in  piecemeal fashion using Kerrison rongeurs. Underlying thecal sac and exiting  S1 nerve root were identified.  Microscope was brought into the field and  used for microdissection of the left-sided S1 nerve root and underlying disk  herniation.  Epidural venous plexus coagulated and cut.  The thecal sac and  S1 nerve root were mobilized and retracted towards the midline.  Disk  herniation was readily apparent.  This was then incised with a 15 blade in  rectangular fashion.  A wide disk space clean-out was then achieved using  pituitary rongeurs, up-angled pituitary rongeurs, and Epstein curettes.  All  loose or obviously degenerative disk material was removed from the  interspace.  All elements of the disk herniation were completely  removed.  At this point, a very thorough decompression had been achieved.  There was  no injury to thecal sac or nerve roots.  The wound was then irrigated with  antibiotic solution.  Gelfoam was placed topically for hemostasis.  The  microscope and retractor system were removed.  Hemostasis in the muscles was achieved with electrocautery.  The  wounds  were closed in layers with Vicryl sutures.  Steri-Strips and sterile  dressings were applied.  The patient tolerated the procedure well and she  returned to recovery room postoperatively.           ______________________________  Kathaleen Maser Pool, M.D.     HAP/MEDQ  D:  10/31/2004  T:  10/31/2004  Job:  [id number]

## 2010-07-04 NOTE — H&P (Signed)
NAMERENNY, GUNNARSON                ACCOUNT NO.:  [id number]   MEDICAL RECORD NO.:  [id number]          PATIENT TYPE:  INP   LOCATION:  NA                           FACILITY:  [facility]   PHYSICIAN:  Ollen Gross, M.D.    DATE OF BIRTH:  September 09, 1930   DATE OF ADMISSION:  DATE OF DISCHARGE:                              HISTORY & PHYSICAL   DATE OF OFFICE VISIT/HISTORY AND PHYSICAL:  January 01, 2006.   DATE OF ADMISSION:  January 27, 2006.   CHIEF COMPLAINT:  Right knee pain.   HISTORY OF PRESENT ILLNESS:  Patient is a 77 year old female, well known  to Dr. Trudee Grip.  Had previously been to surgery for knee arthritis  bilaterally.  She has undergone a left total knee back in May, 2005.  The right knee is at a point where it is giving her a hard time.  It has  progressed in nature.  She has reached the point where she would like to  have the other side done.  Risks and benefits have been discussed.  Patient subsequently was admitted to the hospital.   ALLERGIES:  SULFA.   INTOLERANCES:  MORPHINE makes her crazy and does not help.   Please note that she is not allergic to codeine, as previously  documented in the past.   CURRENT MEDICATIONS:  Wellbutrin, Aromasin, diclofenac, temazepam,  clonazepam, Singulair, Avalide, albuterol, and __________.   PAST MEDICAL HISTORY:  Depression, asthma, hypertension, breast cancer,  postmenopausal.   PAST SURGICAL HISTORY:  Neck surgery x4, hip surgery, left total knee  replacement, gallbladder surgery, right lumpectomy.   SOCIAL HISTORY:  Married.  A clinical psychologist.  Nonsmoker.  Two  drinks a month.  Three children.   FAMILY HISTORY:  Significant for colon cancer, diabetes, and  hypertension.   REVIEW OF SYSTEMS:  GENERAL:  No fevers, chills, night sweats.  NEURO:  No seizures, syncope, paralysis.  RESPIRATORY:  Occasionally uses his  inhaler for asthma.  No shortness of breath, productive cough, or  hemoptysis.   CARDIOVASCULAR:  No chest pain, angina, or orthopnea.  GI:  No nausea, vomiting, diarrhea, or constipation.  GU:  No dysuria,  hematuria, or discharge.  MUSCULOSKELETAL:  Left knee, found in the  history of present illness.   PHYSICAL EXAMINATION:  VITAL SIGNS:  Pulse 88, respiratory rate 16,  blood pressure 112/82.  GENERAL:  A 77 year old white female who is well-developed, well-  nourished, overweight in no acute distress.  Alert, oriented and  cooperative.  Excellent historian.  HEENT:  Normocephalic and atraumatic.  Pupils are round and reactive.  Oropharynx is clear.  EOMs are intact.  NECK:  Supple.  No bruits.  CHEST:  Clear anterior and posterior chest wall.  HEART:  Regular rate and rhythm.  No murmur.  ABDOMEN:  Soft, nontender, round.  Bowel sounds present.  RECTAL/BREASTS/GENITALIA:  Not done.  Not pertinent to the present  illness.  EXTREMITIES:  Right knee:  No effusion.  Tender to palpation.  Mild  crepitus is noted on passive range of motion.   IMPRESSION:  1. Osteoarthritis,  right knee.  2. History of breast cancer.  3. Asthma.  4. Hypertension.  5. History of previous Staphylococcal infection with previous cervical      surgery.   PLAN:  Patient admitted to [facility] to undergone a right  total knee arthroplasty.  Surgery will be performed by Dr. Trudee Grip.      Alexzandrew L. Julien Girt, P.A.      Ollen Gross, M.D.  Electronically Signed    ALP/MEDQ  D:  01/26/2006  T:  01/27/2006  Job:  [id number]   cc:   Stacie Glaze, MD  [address] [place]  [place] [address]

## 2010-07-04 NOTE — Op Note (Signed)
NAME:  [name], [name]                          ACCOUNT NO.:  [id number]   MEDICAL RECORD NO.:  [id number]                   PATIENT TYPE:  OIB   LOCATION:  2899                                 FACILITY:  [facility]   PHYSICIAN:  Currie Paris, M.D.           DATE OF BIRTH:  14-Apr-1930   DATE OF PROCEDURE:  04/26/2003  DATE OF DISCHARGE:                                 OPERATIVE REPORT   [facility]S [phone].   PREOPERATIVE DIAGNOSIS:  Right breast cancer.   POSTOPERATIVE DIAGNOSIS:  Right breast cancer, lower outer quadrant.   OPERATION:  Needle-guided excision, right breast cancer, with blue dye  injection and sentinel node biopsy.   SURGEON:  Currie Paris, M.D.   ANESTHESIA:  General.   CLINICAL HISTORY:  This patient is a 77 year old recently found to have a  small right breast mass, which on core biopsy showed an invasive mammary  carcinoma.  MRI showed this to be the only apparent lesion.  After  discussion with the patient, we elected to do a wide local excision with  sentinel node biopsy.   DESCRIPTION OF PROCEDURE:  The patient was seen in the holding area and had  no further questions.  She had already had her guidewire placed in the right  breast and the right breast injected with a radioisotope.   The patient was taken to the operating room and after satisfactory general  anesthesia had been obtained, the breast was injected with dilute methylene  blue.  The breast was then prepped and draped.  Dr. Yolanda Bonine had placed the  guidewire inferior to the mass, and it tracked superiorly.  She had made an  X over the breast where the apparent area of the tumor was by ultrasound,  and I made an elliptical incision just below that.  I then manipulated the  guidewire into the wound and with some retraction on the inferior skin edge  divided the breast tissue down toward the chest wall.  As I got down right  at the chest wall, I recognized that I was apparently very adjacent to  the  tumor and the tumor was a little bit more inferior with the patient  positioned on the operating room table than it had been positioned at the  time of her ultrasound.  However, once I had this tumor localized I then was  able to go well around it medially, laterally, and superiorly, so that I  felt that I had very good margins around the tumor.  I did send the specimen  for mammography and marked both the medial corner and the area where the  tumor was with sutures.  I then went back and took another centimeter thick  piece of tissue at the tumor site where the margin was going to be close so  that I could assure that that margin would likewise be negative.  Bleeders  were controlled with  cautery.  I put a pack there and turned my attention  back to the axillary area.   Using the Neoprobe I identified a hot area and made a transverse incision,  divided the subcutaneous tissues and the fatty tissues until we entered the  breast tissue of the axillary fat pad.  I saw a blue lymphatic, traced that  a little bit posteriorly and superiorly, and found a lymph node turning blue  that had counts of about 3000, and this was excised.  Using the Neoprobe I  found another hot area that was a little bit more anterior and a little bit  more inferior than the first, and with a little bit of dissection found  another hot node and, again, a small blue lymphatic leading into it but it  had not changed color.  This was excised and had counts of around 5500.  Everything appeared to be dry.  I did not feel any palpably enlarged nodes.  There was no evidence of any hot areas now with all the counts being down to  a background of about 20.  I then went ahead and infiltrated this area with  some Marcaine and closed it in layers with 3-0 Vicryl and 4-0 Monocryl  subcuticular.   Attention was turned back to the breast, which had remained dry.  It was  closed with some 3-0 Vicryl to close the superficial  breast tissue so that  we would have a fairly thick skin closure leaving a place for a pocket  seroma cavity in case we were able to use postoperative local irradiation.  The skin was closed with 4-0 Monocryl subcuticular.  I spoke with Dr. Almyra Free  initially to orient her about the specimens, and she subsequently reported  that the two sentinel nodes were negative for metastases and that our  margins were clear.   Dermabond was applied and sterile dressings and an Ace wrap placed for some  compression.  The patient tolerated the procedure well.  There were no  operative complications, and all counts were correct.                                               Currie Paris, M.D.    CJS/MEDQ  D:  04/26/2003  T:  04/27/2003  Job:  [id number]   cc:   Stacie Glaze, M.D. [facility]   Jeralyn Ruths, M.D.

## 2010-07-04 NOTE — Op Note (Signed)
[name], English                ACCOUNT NO.:  [id number]   MEDICAL RECORD NO.:  [id number]          PATIENT TYPE:  INP   LOCATION:  0001                         FACILITY:  [facility]   PHYSICIAN:  Ollen Gross, M.D.    DATE OF BIRTH:  06-09-1930   DATE OF PROCEDURE:  01/27/2006  DATE OF DISCHARGE:                               OPERATIVE REPORT   PREOPERATIVE DIAGNOSIS:  Osteoarthritis, right knee.   POSTOPERATIVE DIAGNOSIS:  Osteoarthritis, right knee.   PROCEDURE:  Right total knee arthroplasty.   SURGEON:  Dr. Lequita Halt   ASSISTANT:  Avel Peace, PA-C   ANESTHESIA:  Spinal.   ESTIMATED BLOOD LOSS:  Minimal.   DRAIN:  Hemovac x1.   TOURNIQUET TIME:  39 minutes at 300 mmHg.   COMPLICATIONS:  None.   CONDITION:  Stable to recovery.   BRIEF CLINICAL NOTE:  [name] is a 77 year old female who has end-stage  osteoarthritis of the right knee with intractable pain.  She has had a  previous successful left total knee arthroplasty, presents now for a  right total knee arthroplasty.   PROCEDURE IN DETAIL:  After successful administration of spinal  anesthetic, a tourniquet is placed high on her right thigh and right  lower extremity prepped and draped in the usual sterile fashion.  The  extremity is wrapped in Esmarch, knee flexed, tourniquet inflated to 300  mmHg.  Midline incision is made with a 10 blade through subcutaneous  tissue to the level of the extensor mechanism.  A fresh blade is used to  make a medial parapatellar arthrotomy.  Soft tissue over the proximal  and medial tibia is subperiosteally elevated to the joint line with a  knife and into the semimembranosus bursa with a Cobb elevator.  Soft  tissue laterally is elevated with attention being paid to avoiding the  patellar tendon on tibial tubercle.  The patella is subluxed laterally,  knee flexed 90 degrees, ACL and PCL removed.  Drill is used to create a  starting hole in the distal femur, and the canal is  thoroughly  irrigated.  A 5-degree right valgus alignment guide is placed and  referencing off the posterior condyles, rotation is marked and the block  pinned to remove 10 mm off the distal femur.  Distal femoral resection  is made with an oscillating saw.  A sizing block is placed; size 3 is  most appropriate.  The size 3 cutting block is placed and then the  anterior, posterior, and chamfer cuts are made.   The tibia is subluxed forward, and the menisci are removed.  Extramedullary tibial alignment guide is placed, referencing proximally  at the medial aspect of the tibial tubercle and distally along the  second metatarsal axis and tibial crest.  The block is pinned to remove  approximately 10 mm off the nondeficient lateral side.  Tibial resection  is made with an oscillating saw.  Went out to the bottom of the medial  defects.  I had to take an additional 2 mm to get to the bottom of the  medial defect.  Size 3  is the most appropriate tibial component, and the  proximal tibia is prepared with the modular drill and keel punch for a  size 3.  Femoral preparation is completed with the intercondylar cut.   Size 3 mobile bearing tibial trial with a size 3 posterior stabilized  femoral trial and a 12.5 mm posterior stabilized rotating platform  insert trial are placed.  Full extension is achieved with excellent  varus and valgus balance throughout full range of motion.  The patella  is everted, thickness measured to be 22 mm.  Free-hand resection is  taken to 12 mm, 38 template is placed, lug holes are drilled, trial  patellar is placed and it tracks normally.  Osteophytes are then removed  off the posterior femur with the trial in place.  The trials are  removed, and the cut bone surfaces are prepared with pulsatile lavage.  Cement is mixed and once ready for implantation, the size 3 mobile  bearing tibial tray, size 3 posterior stabilized femur, and 38 patella  are cemented into place,  and the patella is held with a clamp.  The  trial 12.5 insert is placed, knee held in full extension, and all  extruded cement removed.  Once the cement is fully hardened, then the  permanent 12.5 mm posterior stabilized rotating platform insert is  placed into the tibial tray.  The wound is copiously irrigated with  saline solution and the extensor mechanism closed over a Hemovac drain  with interrupted #1 PDS.  Tourniquet is released for a total time of 39  minutes.  Flexion against gravity is 130 degrees.  Subcu is closed with  interrupted 2-0 Vicryl, subcuticular with running 4-0 Monocryl.  Drain  is hooked to incision, incision cleaned and dried, and Steri-Strips and  a bulky sterile dressing applied.  She is then awakened and transported  to recovery in stable condition.      Ollen Gross, M.D.  Electronically Signed     FA/MEDQ  D:  01/27/2006  T:  01/27/2006  Job:  [id number]

## 2010-07-04 NOTE — Op Note (Signed)
[facility]  Patient:    [name], [name]                    MRN: [id number] Proc. Date: 07/04/99 Adm. Date:  [id number] Disc. Date: [id number] Attending:  Mervin Hack CC:         Stacie Glaze, M.D. [facility]             Cheryle Horsfall, M.D.                           Operative Report  PROCEDURE:  Colonoscopy.  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  She is status post for a benign polyp of the left colon on colonoscopy in 1996, and is undergoing a repeat colonoscopy for follow up of colon polyps.  She was incidentally found to have mild diverticulosis of the left colon, as well as first-grade hemorrhoids.  At the time of her last colonoscopy she was having some intermittent hematochezia.  She now denies any recent episodes of rectal bleeding.  ENDOSCOPE:  Olympus video single-channel endoscope.  SEDATION:  Versed 8 mg IV, Demerol 100 mg IV.  FINDINGS:  Olympus single-channel video endoscope was passed under direct vision through the rectum into the sigmoid colon.  The patient was monitored by pulse oximetry.  Her oxygen saturations were as low as 87% and responded to increased nasal oxygen.  The anal canal showed first-grade hemorrhoids.  There were no active fissure or any other lesion.  The sigmoid colon showed a few shell diverticuli, which did not cause any narrowing over the colon lumen. The view of the descending colon reveals this entirely normal, there is no evidence of extrinsic compression.  The left colon, transverse colon and right colon was unremarkable, with normal appearing cecum and ileocecal valve.  The ileocecal on pull out did not show any deformity.  IMPRESSION: 1. Mild diverticulosis over the left colon. 2. No evidence of recurrent polyps.  PLAN: 1. Continue high fiber diet. 2. Repeat colonoscopy in seven to eight years. 3. ______  Hemoccults every two to three years.DD:  07/04/99 TD:  07/09/99 Job: [id number]

## 2010-07-04 NOTE — Discharge Summary (Signed)
[name], [name]                ACCOUNT NO.:  [id number]   MEDICAL RECORD NO.:  [id number]          PATIENT TYPE:  INP   LOCATION:  1605                         FACILITY:  [facility]   PHYSICIAN:  Ollen Gross, M.D.    DATE OF BIRTH:  10-25-1930   DATE OF ADMISSION:  01/05/2007  DATE OF DISCHARGE:  01/07/2007                               DISCHARGE SUMMARY   ADMITTING DIAGNOSIS:  1. Recurrent dislocations right hip.  2. History of depression.  3. Asthma.  4. Hypertension.  5. History of breast cancer.  6. Postmenopausal.  7. Non-insulin-dependent diabetes mellitus.  8. Degenerative disk disease.  9. Previous right total hip.  10.Previous left total knee.   DISCHARGE DIAGNOSIS:  1. Recurrent dislocations right total hip status post right hip      acetabular revision with a constrained liner.  2. History of depression.  3. Asthma.  4. Hypertension.  5. History of breast cancer.  6. Postmenopausal.  7. Non-insulin-dependent diabetes mellitus.  8. Degenerative disk disease.  9. Previous right total hip.  10.Previous left total knee.   PROCEDURE:  January 05, 2007 right hip acetabular revision constrained  liner.   SURGEON:  Ollen Gross, M.D.   ASSISTANT:  Alexzandrew L. Perkins, P.A.C.   CONSULTS:  None.   BRIEF HISTORY:  [name] is a 77 year old female with total hip done  about several years ago, had multiple dislocations, and now presents for  constrained liner.   LABORATORY DATA:  Preop CBC revealed hemoglobin 14.5, hematocrit of  41.4, white cell count 2.4.  Postop hemoglobin 12.5, last H&H 12.1 and  35.02.  PT/PTT preop 13.3 and 30 respectively.  INR 1.0.  Postop PT/INR  13.9/1.1.  Chem panel on admission all within normal limits.  Serial  BMETs were followed.  Glucose went 90-168 back to 122.  Remaining  electrolytes all within normal limits.  Preop UA:  Moderate leukocyte  esterase, few epithelials, only 3-6 white cells, 0-2 red cells, rare  bacteria.   Blood group type B negative.  Hip films on January 04, 2007:  Previous total hip replacement, presently the prosthetic femoral head is  properly located in the prosthetic acetabular cup, no signs of  loosening.  A 2-view chest January 04, 2007:  Bronchitis without  consolidation or collapse.  Portable pelvis January 05, 2007:  Right  hip arthroplasty with anatomic alignment, no acute complicating  features.   HOSPITAL COURSE:  The patient admitted to [facility] taken to  the OR, underwent the above stated procedure without complication.  The  patient tolerated the procedure well, later transferred to recovery room  then orthopedic floor, given p.o. on IV analgesic pain control following  surgery.  Had a decent night, seen in rounds.  We stopped her Coumadin,  actually we just going to do Lovenox.  We stopped the PT/INRs.  Blood  pressure was a little low, but normal, so we held her Avapro done on  parameters, discontinued her PCA which was started after surgery.  We  discontinued that after lunch.  We started to get up with PT, actually  did well on day #1, and then by day #2, doing well.  Had another session  of physical therapy, progressing, and was discharged home.   DISCHARGE PLAN:  1. Patient discharged home January 07, 2007  2. Discharge diagnoses please see above.  3. Discharge meds:  Vicodin, Robaxin, Lovenox.  4. Follow up in 2 weeks.   ACTIVITY:  Weightbearing as tolerated, home health PT, home health  nursing.   DISPOSITION:  Home.   CONDITION ON DISCHARGE:  Improving.      Alexzandrew L. Perkins, P.A.C.      Ollen Gross, M.D.  Electronically Signed    ALP/MEDQ  D:  02/03/2007  T:  02/04/2007  Job:  [id number]   cc:   Ollen Gross, M.D.  Fax: [phone]   Stacie Glaze, MD  [address] [place]  [place] [address]

## 2010-07-04 NOTE — Op Note (Signed)
[name], [name]                ACCOUNT NO.:  [id number]   MEDICAL RECORD NO.:  [id number]          PATIENT TYPE:  AMB   LOCATION:  DSC                          FACILITY:  [facility]   PHYSICIAN:  Katy Fitch. Sypher, M.D. DATE OF BIRTH:  [birth year]   DATE OF PROCEDURE:  04/27/2006  DATE OF DISCHARGE:  04/27/2006                               OPERATIVE REPORT   PREOPERATIVE DIAGNOSIS:  Chronic right carpal tunnel syndrome.   POSTOPERATIVE DIAGNOSIS:  Chronic right carpal tunnel syndrome.   OPERATIONS:  Release of right transmetacarpal ligament.   SURGEON:  Katy Fitch. Sypher, M.D.   ASSISTANT:  Marveen Reeks. Dasnoit, P.A.-C.   ANESTHESIA:  General by LMA.   SUPERVISING ANESTHESIOLOGIST:  Zenon Mayo, M.D.   INDICATIONS:  [name] is a 77 year old woman referred through the  courtesy of Dr. Darryll Capers for evaluation and management of right hand  numbness.  She has a history of prior diagnosis of bilateral carpal  tunnel syndrome and is status post release of her left transmetacarpal  ligament by Dr. Ophelia Charter several years prior.   She postponed surgery to the right hand until this time.  She elected to  seek a hand surgery consultation.   After informed consent and repeat electrodiagnostic studies that  confirmed right carpal tunnel syndrome, she is brought to the operating  room at this time.   DESCRIPTION OF PROCEDURE:  [name] was brought to the operating  room and placed in the supine position on the operating table.   Following the induction of general anesthesia by LMA technique, the  right arm was prepped with Betadine soap and solution and sterilely  draped.  A pneumatic tourniquet was applied to the proximal right  brachium.   Following exsanguination of the right arm with an Esmarch bandage, the  arterial tourniquet was inflated to 220 mmHg.  The procedure commenced  with a short incision in the line of the ring finger and the palm.  The  subcutaneous  tissues were carefully divided, revealing the palmar  fascia.  The fibers of the palmar fascia were carefully divided to  reveal the common sensory branches of the median nerve in the  superficial palmar arch.  The branches of the median nerve were followed  back to the median nerve proper which was gently isolated from the  transmetacarpal ligament utilizing a Insurance risk surveyor.  Thereafter,  the transverse carpal ligament was released with scissors subcutaneously  extending into the distal forearm.   This widely opened the carpal canal.  No mass or other predicaments were  noted.   Bleeding points along the margin of the released ligament were  electrocauterized with bipolar current followed by repair of the skin  with intradermal 3-0 Prolene suture.   A compressive dressing was applied with a volar plaster splint,  maintaining the wrist in 5 degrees of dorsiflexion.   There no apparent complications.   [name] tolerated surgery and anesthesia well.  She was transferred to  the recovery room with stable vital signs.      Katy Fitch Sypher, M.D.  Electronically Signed     RVS/MEDQ  D:  04/27/2006  T:  04/29/2006  Job:  [id number]

## 2010-07-04 NOTE — H&P (Signed)
NAME:  [name], [name]                          ACCOUNT NO.:  [id number]   MEDICAL RECORD NO.:  [id number]                   PATIENT TYPE:  INP   LOCATION:  2550                                 FACILITY:  [facility]   PHYSICIAN:  Genene Churn. Barry Dienes, P.A.                DATE OF BIRTH:  05-Mar-1930   DATE OF ADMISSION:  09/26/2001  DATE OF DISCHARGE:                                HISTORY & PHYSICAL   CHIEF COMPLAINT:  Neck pain, swelling, and posterior neck infection.   HISTORY OF PRESENT ILLNESS:  A 77 year old white female who is status post  C6-7 posterior fusion Jul 15, 2001, who is currently with posterior cervical  fusion postoperative infection, presents for a preoperative evaluation for a  posterior cervical exploration with debridement and antibiotic bead  placement.  Since the procedure, she has complained of increasing pain and  swelling in her neck.  She has had no response with conservative treatment.  Significant decrease in her daily activity level due to her ongoing  complaints.  C-spine MRI scan showed posterior fluid collection in the  muscle consistent with infection but without extension to the bone.   CURRENT MEDICATIONS:  1. Voltaren 75 mg p.o. b.i.d.  2. Avapro 50 mg p.o. q.d.  3. Premarin 0.625 mg p.o. q.d.  4. Celexa 20 mg p.o. b.i.d.  5. Albuterol and Advair inhalers p.r.n.   ALLERGIES:  SULFA.   PAST SURGICAL HISTORY:  1. Right total hip arthroplasty August 2000.  2. Cholecystectomy in 2001.  3. Cervical diskectomy in 1968.  4. Hysterectomy.  5. T&A as a child.  6. C4-5, C5-6, C6-7 ACD&F with left iliac crest bone graft in 2003.  7. C6-7 posterior fusion in May 2003.   FAMILY HISTORY:  Positive for diabetes, colon cancer, hypertension.   SOCIAL HISTORY:  The patient is married and currently employed as a  Warden/ranger.  Admits to occasional alcohol consumption.  Denies smoking.   REVIEW OF SYSTEMS:  Positive for arthritis, asthma, bronchitis,  cataracts,  hypertension, and mononucleosis.  Currently denies any chest pain, shortness  of breath, abdominal pain, nausea, vomiting, fever, chills, night sweats,  dysphagia.  All other systems noncontributory.   PHYSICAL EXAMINATION:  VITAL SIGNS:  Temperature 98.4, pulse 64,  respirations 20, blood pressure 130/92.  GENERAL:  Pleasant white female who is alert and oriented x3 and in no acute  distress.  HEENT:  Head is normocephalic, atraumatic.  PERRLA and EOMI.  Oral mucosa  pink and moist.  NECK:  She has decreased range of motion secondary to discomfort.  Left side  of the neck more swollen than the right.  Posterior neck swelling with  redness and minimal warmth.  Left greater than right side tenderness.  Carotids are intact.  No lymphadenopathy.  LUNGS:  CTA bilaterally.  No wheezes, rales, or rhonchi.  BREASTS:  Not examined.  HEART:  RRR.  Normal  S1, S2.  No murmurs, rubs, or gallops noted.  ABDOMEN:  Round and protuberant.  NABS x4.  Soft, nontender.  No palpable  masses or organomegaly.  EXTREMITIES:  Good range of motion throughout.  Sensory and motor function  intact.  No isolated motor weakness.  Reflexes are intact.  Pulses are  intact.  SKIN:  Warm and dry.   LABORATORY DATA:  C-spine MRI scan showed posterior fluid collection in the  muscle consistent with infection.  It does not appear to be deep in the bone  graft.  Fluid above spinous process level.   ASSESSMENT:  Posterior cervical fusion postoperative infection.   PLAN:  Posterior cervical exploration with debridement and antibiotic bead  placement.  Operative versus nonoperative treatments were discussed with the  patient.  Possible risks and complications such as bleeding, infection,  nerve damage, paralysis, dural tears, spinal fluid leak, headaches,  fractures, anesthesia complications, and reoperation also discussed.  All  questions answered and she wishes to proceed with the surgery at this  time.                                               Genene Churn. Denton Meek.    JMO/MEDQ  D:  09/26/2001  T:  09/26/2001  Job:  [id number]

## 2010-07-24 ENCOUNTER — Other Ambulatory Visit: Payer: Self-pay | Admitting: Oncology

## 2010-07-24 ENCOUNTER — Encounter (HOSPITAL_BASED_OUTPATIENT_CLINIC_OR_DEPARTMENT_OTHER): Payer: Medicare Other | Admitting: Oncology

## 2010-07-24 DIAGNOSIS — C50419 Malignant neoplasm of upper-outer quadrant of unspecified female breast: Secondary | ICD-10-CM

## 2010-07-24 LAB — COMPREHENSIVE METABOLIC PANEL
ALT: 18 U/L (ref 0–35)
AST: 18 U/L (ref 0–37)
Alkaline Phosphatase: 100 U/L (ref 39–117)
Calcium: 9.8 mg/dL (ref 8.4–10.5)
Chloride: 97 mEq/L (ref 96–112)
Creatinine, Ser: 0.59 mg/dL (ref 0.50–1.10)
Total Bilirubin: 0.5 mg/dL (ref 0.3–1.2)

## 2010-07-24 LAB — CBC WITH DIFFERENTIAL/PLATELET
BASO%: 0.7 % (ref 0.0–2.0)
EOS%: 2.5 % (ref 0.0–7.0)
HCT: 41.2 % (ref 34.8–46.6)
MCH: 32.2 pg (ref 25.1–34.0)
MCHC: 34 g/dL (ref 31.5–36.0)
MCV: 94.7 fL (ref 79.5–101.0)
MONO%: 8.1 % (ref 0.0–14.0)
NEUT%: 66.6 % (ref 38.4–76.8)
RDW: 13.7 % (ref 11.2–14.5)
lymph#: 1.3 10*3/uL (ref 0.9–3.3)

## 2010-07-25 ENCOUNTER — Ambulatory Visit (INDEPENDENT_AMBULATORY_CARE_PROVIDER_SITE_OTHER): Payer: Medicare Other | Admitting: Internal Medicine

## 2010-07-25 ENCOUNTER — Encounter: Payer: Self-pay | Admitting: Internal Medicine

## 2010-07-25 VITALS — BP 110/70 | HR 84 | Temp 98.2°F | Resp 16 | Ht 66.5 in | Wt 170.0 lb

## 2010-07-25 DIAGNOSIS — M1990 Unspecified osteoarthritis, unspecified site: Secondary | ICD-10-CM

## 2010-07-25 DIAGNOSIS — N390 Urinary tract infection, site not specified: Secondary | ICD-10-CM

## 2010-07-25 DIAGNOSIS — E8881 Metabolic syndrome: Secondary | ICD-10-CM

## 2010-07-25 DIAGNOSIS — I10 Essential (primary) hypertension: Secondary | ICD-10-CM

## 2010-07-25 DIAGNOSIS — J309 Allergic rhinitis, unspecified: Secondary | ICD-10-CM

## 2010-07-25 DIAGNOSIS — M48061 Spinal stenosis, lumbar region without neurogenic claudication: Secondary | ICD-10-CM

## 2010-07-25 LAB — POCT URINALYSIS DIPSTICK
Bilirubin, UA: NEGATIVE
Ketones, UA: NEGATIVE
Spec Grav, UA: 1.02
pH, UA: 6

## 2010-07-25 MED ORDER — AZELASTINE HCL 0.1 % NA SOLN: 2.0000 | Freq: Two times a day (BID) | NASAL | Status: DC

## 2010-07-25 NOTE — Progress Notes (Signed)
Subjective:    Patient ID: [name], female    DOB: 1931-02-05, 77 y.o.   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  HENT: Negative for ear pain, congestion, neck pain, postnasal drip and sinus pressure.   Eyes: Negative for redness and visual disturbance.  Respiratory: Negative for cough, shortness of breath and wheezing.   Gastrointestinal: Negative for abdominal pain and abdominal distention.  Genitourinary: Negative for dysuria, frequency and menstrual problem.  Musculoskeletal: Negative for myalgias, joint swelling and arthralgias.  Skin: Negative for rash and wound.  Neurological: Negative for dizziness, weakness and headaches.  Hematological: Negative for adenopathy. Does not bruise/bleed easily.  Psychiatric/Behavioral: Negative for sleep disturbance and decreased concentration.   Past Medical History  Diagnosis Date  . Asthma   . Depression   . Breast cancer   . Hypertension   . Arthritis   . Diverticulosis of colon   . History of colon polyps   . Insomnia    Past Surgical History  Procedure Date  . Cervical fusion   . Cervical laminectomy     x4  . Lumbar laminectomy     with spinal stenosis  . Cholecystectomy   . Total hip arthroplasty   . Breast lumpectomy   . Manipulation of hip for dislocation   . Tonsillectomy   . Carpal tunnel release     reports that she quit smoking about 20 years ago. She does not have any smokeless tobacco history on file. She reports that she drinks alcohol. She reports that she does not use illicit drugs. family history includes Arthritis in her mother; Cancer in her father; Colon  cancer in her father; and Diabetes in her brothers and father. Allergies  Allergen Reactions  . Morphine Sulfate     REACTION: very anxious  . Sulfamethoxazole     REACTION: unspecified       Objective:   Physical Exam  Nursing note and vitals reviewed. Constitutional: She is oriented to person, place, and time. She appears well-developed and well-nourished. No distress.  HENT:  Head: Normocephalic and atraumatic.  Right Ear: External ear normal.  Left Ear: External ear normal.  Nose: Nose normal.  Mouth/Throat: Oropharynx is clear and moist.  Eyes: Conjunctivae and EOM are normal. Pupils are equal, round, and reactive to light.  Neck: Normal range of motion. Neck supple. No JVD present. No tracheal deviation present. No thyromegaly present.  Cardiovascular: Normal rate, regular rhythm, normal heart sounds and intact distal pulses.   No murmur heard. Pulmonary/Chest: Effort normal and breath sounds normal. She has no wheezes. She exhibits no tenderness.  Abdominal: Soft. Bowel sounds are normal.  Musculoskeletal: She exhibits no edema and no tenderness.  Lymphadenopathy:    She has no cervical adenopathy.  Neurological: She is alert and oriented to person, place, and time. She has normal reflexes. No cranial nerve deficit.  Skin: She is not diaphoretic.       bruising  Psychiatric: She has a normal mood and affect. Her behavior is normal.          Assessment & Plan:  Dr Evlyn Kanner is doing the A1C  The pt's weight loss Depression Blood pressure stable The pt asthma is stable with singular Will try astelin for alleries

## 2010-07-28 LAB — URINE CULTURE: Colony Count: 35000

## 2010-07-30 ENCOUNTER — Encounter (HOSPITAL_BASED_OUTPATIENT_CLINIC_OR_DEPARTMENT_OTHER): Payer: Medicare Other | Admitting: Oncology

## 2010-07-30 DIAGNOSIS — C50919 Malignant neoplasm of unspecified site of unspecified female breast: Secondary | ICD-10-CM

## 2010-08-04 ENCOUNTER — Telehealth: Payer: Self-pay | Admitting: *Deleted

## 2010-08-04 NOTE — Telephone Encounter (Signed)
Pt is concerned about her Urine culture results in the last few weeks, and feels she wasn't treated for an UT

## 2010-08-04 NOTE — Telephone Encounter (Signed)
Patient has had multiple urinary tract infections treated on several occasions with ciprofloxacin and most recently with a culture that showed 30,000 colonies mixed species.  Her oncologist treated her with Macrobid. We called the patient and discussed this chronic urinary tract infection with a culture that was nondiagnostic.  She agrees to having a culture done after completion of the Macrobid if she still has a positive growth of bacteria cannot be identified she is agreed to see the urologist for chronic colonization

## 2010-08-04 NOTE — Telephone Encounter (Signed)
Per Dr. Lovell Sheehan, please call pt and he will call pt.

## 2010-08-04 NOTE — Telephone Encounter (Signed)
Debbie I think dr Lovell Sheehan wanted you to document what happened

## 2010-08-25 ENCOUNTER — Telehealth: Payer: Self-pay | Admitting: *Deleted

## 2010-08-25 NOTE — Telephone Encounter (Signed)
Per dr Lovell Sheehan- if it was a brownrecluse,,it would be an ulcer. Non venomous spider just leave bruising- observe and call if it becomes an ulcer

## 2010-08-25 NOTE — Telephone Encounter (Signed)
thanks

## 2010-08-25 NOTE — Telephone Encounter (Signed)
Pt states she was bitten by something on her wrist, has a bruise but other than that it does not bother her.  Questioning if it could be a brown recluse spider bite??

## 2010-08-25 NOTE — Telephone Encounter (Signed)
Pt. Notified.

## 2010-08-26 ENCOUNTER — Other Ambulatory Visit: Payer: Medicare Other

## 2010-08-26 DIAGNOSIS — N390 Urinary tract infection, site not specified: Secondary | ICD-10-CM

## 2010-08-26 LAB — POCT URINALYSIS DIPSTICK
Blood, UA: NEGATIVE
Glucose, UA: NEGATIVE
Nitrite, UA: NEGATIVE
Urobilinogen, UA: 0.2
pH, UA: 6.5

## 2010-08-26 NOTE — Progress Notes (Signed)
Pt was informed of clear urine

## 2010-09-02 ENCOUNTER — Other Ambulatory Visit: Payer: Self-pay | Admitting: Internal Medicine

## 2010-09-15 ENCOUNTER — Other Ambulatory Visit: Payer: Medicare Other

## 2010-09-15 ENCOUNTER — Telehealth: Payer: Self-pay | Admitting: *Deleted

## 2010-09-15 DIAGNOSIS — N390 Urinary tract infection, site not specified: Secondary | ICD-10-CM

## 2010-09-15 LAB — POCT URINALYSIS DIPSTICK
Bilirubin, UA: NEGATIVE
Glucose, UA: NEGATIVE
Nitrite, UA: POSITIVE
pH, UA: 6.5

## 2010-09-15 MED ORDER — CIPROFLOXACIN HCL 250 MG PO TABS: 250.0000 mg | ORAL_TABLET | Freq: Two times a day (BID) | ORAL | Status: AC

## 2010-09-15 NOTE — Telephone Encounter (Signed)
patient  Is aware, labs ordered, and rx sent.

## 2010-09-15 NOTE — Progress Notes (Signed)
Addended by: Rita Ohara R on: 09/15/2010 03:25 PM   Modules accepted: Orders

## 2010-09-15 NOTE — Telephone Encounter (Signed)
patient  Would like to know if she can come by the office and leave a urine sample because she thinks she may have a UTI.  Is this okay?

## 2010-09-15 NOTE — Telephone Encounter (Signed)
Per dr Lovell Sheehan- would like her to come and give specimen while here,because it will be more accurate for a c and s.  May go ahead and call in for cipro 250 bid for 5 days until we hear from c and s

## 2010-09-17 LAB — URINE CULTURE

## 2010-09-23 ENCOUNTER — Telehealth: Payer: Self-pay | Admitting: *Deleted

## 2010-09-23 NOTE — Telephone Encounter (Signed)
Pt has been going to water aerobics, and her body temp drops, and she nearly freezes!  Is seeing an Endocrinologist next week, will not go back until she has been checked out.

## 2010-09-24 ENCOUNTER — Telehealth: Payer: Self-pay | Admitting: *Deleted

## 2010-09-24 NOTE — Telephone Encounter (Signed)
Notified pt to come in at 8:30 am in the am.

## 2010-09-24 NOTE — Telephone Encounter (Signed)
Dr jenkins is aware 

## 2010-09-24 NOTE — Telephone Encounter (Signed)
Pt is calling stating she feels so bad, she can hardly stand up, is weak, and scared. No fever or other symptoms. Really wants to speak to Dr. Lovell Sheehan or see him

## 2010-09-25 ENCOUNTER — Encounter: Payer: Self-pay | Admitting: Internal Medicine

## 2010-09-25 ENCOUNTER — Ambulatory Visit
Admission: RE | Admit: 2010-09-25 | Discharge: 2010-09-25 | Disposition: A | Discharge status: Home / Self Care | Payer: Medicare Other | Source: Ambulatory Visit | Attending: Internal Medicine | Admitting: Internal Medicine

## 2010-09-25 ENCOUNTER — Ambulatory Visit (INDEPENDENT_AMBULATORY_CARE_PROVIDER_SITE_OTHER): Payer: Medicare Other | Admitting: Internal Medicine

## 2010-09-25 VITALS — BP 136/80 | HR 72 | Temp 97.7°F | Resp 20 | Ht 66.5 in | Wt 172.0 lb

## 2010-09-25 DIAGNOSIS — R350 Frequency of micturition: Secondary | ICD-10-CM

## 2010-09-25 DIAGNOSIS — R5381 Other malaise: Secondary | ICD-10-CM

## 2010-09-25 DIAGNOSIS — R300 Dysuria: Secondary | ICD-10-CM

## 2010-09-25 DIAGNOSIS — R5383 Other fatigue: Secondary | ICD-10-CM

## 2010-09-25 DIAGNOSIS — N390 Urinary tract infection, site not specified: Secondary | ICD-10-CM

## 2010-09-25 LAB — CBC WITH DIFFERENTIAL/PLATELET
Basophils Absolute: 0 10*3/uL (ref 0.0–0.1)
HCT: 43.3 % (ref 36.0–46.0)
Lymphs Abs: 0.9 10*3/uL (ref 0.7–4.0)
MCV: 98.8 fl (ref 78.0–100.0)
Monocytes Absolute: 0.6 10*3/uL (ref 0.1–1.0)
Monocytes Relative: 13.3 % — ABNORMAL HIGH (ref 3.0–12.0)
Platelets: 194 10*3/uL (ref 150.0–400.0)
RDW: 13.3 % (ref 11.5–14.6)

## 2010-09-25 LAB — BASIC METABOLIC PANEL
Calcium: 9.8 mg/dL (ref 8.4–10.5)
GFR: 68.66 mL/min (ref >60.00)
Sodium: 136 mEq/L (ref 135–145)

## 2010-09-25 LAB — POCT URINALYSIS DIPSTICK
Nitrite, UA: NEGATIVE
Protein, UA: NEGATIVE
pH, UA: 6

## 2010-09-25 MED ORDER — NITROFURANTOIN MONOHYD MACRO 100 MG PO CAPS: 100.0000 mg | ORAL_CAPSULE | Freq: Two times a day (BID) | ORAL | Status: AC

## 2010-09-25 NOTE — Progress Notes (Signed)
Addended by: Bonnye Fava on: 09/25/2010 10:39 AM   Modules accepted: Orders

## 2010-09-25 NOTE — Patient Instructions (Addendum)
Go to the  radiology office in the [address] at building is best accessed by [employer]. in front of [facility]  I have sent in a course of Macrobid 100 mg twice daily for 21 days   Because of these frequent urinary tract infections if the ultrasound does not show [place] why you're having them we may need to involve a urologist and put you on a chronic prophylactic medication

## 2010-09-25 NOTE — Progress Notes (Signed)
Subjective:   Past Medical History  Diagnosis Date  . Asthma   . Depression   . Breast cancer   . Hypertension   . Arthritis   . Diverticulosis of colon   . History of colon polyps   . Insomnia    Past Surgical History  Procedure Date  . Cervical fusion   . Cervical laminectomy     x4  . Lumbar laminectomy     with spinal stenosis  . Cholecystectomy   . Total hip arthroplasty   . Breast lumpectomy   . Manipulation of hip for dislocation   . Tonsillectomy   . Carpal tunnel release     reports that she quit smoking about 20 years ago. She does not have any smokeless tobacco history on file. She reports that she drinks alcohol. She reports that she does not use illicit drugs. family history includes Arthritis in her mother; Cancer in her father; Colon cancer in her father; and Diabetes in her brothers and father. Allergies  Allergen Reactions  . Morphine Sulfate     REACTION: very anxious  . Sulfamethoxazole     REACTION: unspecified     Patient ID: [name], female    DOB: Feb 28, 1930, 77 y.o.   MRN: [id number]  HPI  Was treated for uti with cipro in may and again in June The pt has noted foul smell to urine She went to water exercize last week and was "chilled"... She felt excessively fatigued She has noted temperature intolerance Very emotional  and weak She cannot urinate today The patient had prophylactic antibiotics prior to dental work 3 times to the courses were amoxicillin 2000 mg one of the courses were 4 tablets of another antibiotic.      Review of Systems  Constitutional: Positive for fever, chills, appetite change and fatigue. Negative for activity change.  HENT: Negative for ear pain, congestion, neck pain, postnasal drip and sinus pressure.   Eyes: Negative for redness and visual disturbance.  Respiratory: Negative for cough, shortness of breath and wheezing.   Gastrointestinal: Negative for abdominal pain and abdominal distention.    Genitourinary: Positive for dysuria, urgency, frequency, flank pain and difficulty urinating. Negative for menstrual problem.  Musculoskeletal: Negative for myalgias, joint swelling and arthralgias.  Skin: Negative for rash and wound.  Neurological: Negative for dizziness, weakness and headaches.  Hematological: Negative for adenopathy. Does not bruise/bleed easily.  Psychiatric/Behavioral: Negative for sleep disturbance and decreased concentration.   Past Medical History  Diagnosis Date  . Asthma   . Depression   . Breast cancer   . Hypertension   . Arthritis   . Diverticulosis of colon   . History of colon polyps   . Insomnia    Past Surgical History  Procedure Date  . Cervical fusion   . Cervical laminectomy     x4  . Lumbar laminectomy     with spinal stenosis  . Cholecystectomy   . Total hip arthroplasty   . Breast lumpectomy   . Manipulation of hip for dislocation   . Tonsillectomy   . Carpal tunnel release     reports that she quit smoking about 20 years ago. She does not have any smokeless tobacco history on file. She reports that she drinks alcohol. She reports that she does not use illicit drugs. family history includes Arthritis in her mother; Cancer in her father; Colon cancer in her father; and Diabetes in her brothers and father. Allergies  Allergen Reactions  .  Morphine Sulfate     REACTION: very anxious  . Sulfamethoxazole     REACTION: unspecified        Objective:   Physical Exam  Nursing note and vitals reviewed. Constitutional: She is oriented to person, place, and time. She appears well-developed. No distress.       Patient is a 77 year old white female who is in moderate distress she is pale moderately short of breath  HENT:  Head: Normocephalic and atraumatic.  Right Ear: External ear normal.  Left Ear: External ear normal.  Nose: Nose normal.  Mouth/Throat: Oropharynx is clear and moist.  Eyes: Conjunctivae and EOM are normal. Pupils  are equal, round, and reactive to light.  Neck: Normal range of motion. Neck supple. No JVD present. No tracheal deviation present. No thyromegaly present.  Cardiovascular: Normal rate, regular rhythm, normal heart sounds and intact distal pulses.   No murmur heard. Pulmonary/Chest: Effort normal and breath sounds normal. She has no wheezes. She exhibits no tenderness.  Abdominal: Soft. Bowel sounds are normal.  Musculoskeletal: Normal range of motion. She exhibits no edema and no tenderness.  Lymphadenopathy:    She has no cervical adenopathy.  Neurological: She is alert and oriented to person, place, and time. She has normal reflexes. No cranial nerve deficit.  Skin: Skin is warm and dry. She is not diaphoretic.  Psychiatric: She has a normal mood and affect. Her behavior is normal.          Assessment & Plan:  Apparent chronic recurrent urinary tract infections last culture from 09/15/2010 was E. coli sensitive to all classes of antibiotics.  She was treated with a five-day course of ciprofloxacin and now she returns with chills weakness foul-smelling urine.  Apparently she has been on multiple antibiotic courses recently with 3 episodes of high doses prior to dental work last week.  Despite the coverage with ciprofloxacin and amoxicillin she has your urinary tract symptoms and severe weakness.  We will obtain a basic metabolic panel a CBC with differential and she is going now for an urgent ultrasound of her renal to make sure that no upper tract obstructions causing very etiology of her infections we will treat these infections today for a prolonged course of antibiotics with consideration for prophylaxis because of the frequency of infections

## 2010-09-25 NOTE — Progress Notes (Signed)
Addended by: Bonnye Fava on: 09/25/2010 03:25 PM   Modules accepted: Orders

## 2010-09-26 ENCOUNTER — Telehealth: Payer: Self-pay | Admitting: *Deleted

## 2010-09-26 NOTE — Telephone Encounter (Signed)
Pt.notified

## 2010-09-26 NOTE — Telephone Encounter (Signed)
Pt. Is asking that Dr. Lovell Sheehan call her re: her Urine culture so she can have meds delivered.

## 2010-09-26 NOTE — Telephone Encounter (Signed)
Pt would like to know results of Urine Culture so if she needs meds she can get it into pharmacy today so they can be delivered.  [facility]

## 2010-09-26 NOTE — Telephone Encounter (Signed)
Go ahead and get the Macrobid and started we have not received the urine culture yet

## 2010-09-27 LAB — URINE CULTURE: Colony Count: 25000

## 2010-11-19 ENCOUNTER — Ambulatory Visit: Payer: Medicare Other | Admitting: Internal Medicine

## 2010-11-25 LAB — DIFFERENTIAL
Basophils Relative: 0
Eosinophils Absolute: 0
Monocytes Relative: 7
Neutrophils Relative %: 79 — ABNORMAL HIGH

## 2010-11-25 LAB — PROTIME-INR
INR: 1
INR: 1.1
Prothrombin Time: 13.3

## 2010-11-25 LAB — BASIC METABOLIC PANEL
BUN: 17
CO2: 26
CO2: 27
Calcium: 8.6
Chloride: 105
Chloride: 106
Creatinine, Ser: 0.64
GFR calc Af Amer: >60
GFR calc Af Amer: >60
GFR calc Af Amer: >60
GFR calc non Af Amer: >60
Potassium: 3.7
Potassium: 4.2
Sodium: 137

## 2010-11-25 LAB — CBC
HCT: 35 — ABNORMAL LOW
Hemoglobin: 12.1
Hemoglobin: 12.5
Hemoglobin: 14.5
MCHC: 33.8
MCV: 94.8
MCV: 95.4
RBC: 3.69 — ABNORMAL LOW
RBC: 3.81 — ABNORMAL LOW
RBC: 4.42
RBC: 4.27
RDW: 13.5
WBC: 4.8

## 2010-11-25 LAB — COMPREHENSIVE METABOLIC PANEL
ALT: 26
AST: 21
Alkaline Phosphatase: 104
CO2: 30
GFR calc Af Amer: >60
Glucose, Bld: 90
Potassium: 3.8
Sodium: 135
Total Protein: 6.4

## 2010-11-25 LAB — URINALYSIS, ROUTINE W REFLEX MICROSCOPIC
Bilirubin Urine: NEGATIVE
Hgb urine dipstick: NEGATIVE
Specific Gravity, Urine: 1.029
Urobilinogen, UA: 0.2

## 2010-11-25 LAB — APTT: aPTT: 30

## 2010-11-25 LAB — TYPE AND SCREEN: Antibody Screen: NEGATIVE

## 2010-11-25 LAB — URINE MICROSCOPIC-ADD ON

## 2011-01-19 ENCOUNTER — Other Ambulatory Visit: Payer: Self-pay | Admitting: Internal Medicine

## 2011-01-19 MED ORDER — DIAZEPAM 10 MG PO TABS: 10.0000 mg | ORAL_TABLET | Freq: Two times a day (BID) | ORAL | Status: DC | PRN

## 2011-02-25 ENCOUNTER — Encounter (INDEPENDENT_AMBULATORY_CARE_PROVIDER_SITE_OTHER): Payer: Self-pay | Admitting: Surgery

## 2011-03-21 ENCOUNTER — Telehealth: Payer: Self-pay | Admitting: Oncology

## 2011-03-21 NOTE — Telephone Encounter (Signed)
S/w pt re appts for march.

## 2011-04-29 ENCOUNTER — Other Ambulatory Visit (HOSPITAL_BASED_OUTPATIENT_CLINIC_OR_DEPARTMENT_OTHER): Payer: Medicare Other | Admitting: Lab

## 2011-04-29 DIAGNOSIS — C50419 Malignant neoplasm of upper-outer quadrant of unspecified female breast: Secondary | ICD-10-CM

## 2011-04-29 LAB — COMPREHENSIVE METABOLIC PANEL
ALT: 18 U/L (ref 0–35)
AST: 19 U/L (ref 0–37)
Albumin: 4.4 g/dL (ref 3.5–5.2)
Alkaline Phosphatase: 82 U/L (ref 39–117)
Potassium: 4.2 mEq/L (ref 3.5–5.3)
Sodium: 136 mEq/L (ref 135–145)
Total Protein: 6.4 g/dL (ref 6.0–8.3)

## 2011-04-29 LAB — CBC WITH DIFFERENTIAL/PLATELET
BASO%: 0.4 % (ref 0.0–2.0)
EOS%: 1.1 % (ref 0.0–7.0)
Eosinophils Absolute: 0.1 10*3/uL (ref 0.0–0.5)
MCH: 32.4 pg (ref 25.1–34.0)
MCV: 96.9 fL (ref 79.5–101.0)
MONO%: 8.4 % (ref 0.0–14.0)
NEUT#: 3.3 10*3/uL (ref 1.5–6.5)
RBC: 4.36 10*6/uL (ref 3.70–5.45)
RDW: 14.2 % (ref 11.2–14.5)

## 2011-05-06 ENCOUNTER — Telehealth: Payer: Self-pay | Admitting: *Deleted

## 2011-05-06 ENCOUNTER — Ambulatory Visit (HOSPITAL_BASED_OUTPATIENT_CLINIC_OR_DEPARTMENT_OTHER): Payer: Medicare Other | Admitting: Oncology

## 2011-05-06 VITALS — BP 128/87 | HR 81 | Temp 98.4°F | Ht 66.5 in | Wt 167.6 lb

## 2011-05-06 DIAGNOSIS — G589 Mononeuropathy, unspecified: Secondary | ICD-10-CM

## 2011-05-06 DIAGNOSIS — Z853 Personal history of malignant neoplasm of breast: Secondary | ICD-10-CM

## 2011-05-06 NOTE — Progress Notes (Signed)
ID: [name]   DOB: 1930-09-07  MR#: [id number]  HYQ#:[id number]  HISTORY OF PRESENT ILLNESS: Tian had a routine mammogram on 03-30-07 at [facility] which showed a possible mass at approximately 9:00 in the right breast.  On 04-05-03 she had spot compression views confirming a 1.5 cm. irregular mass at 9:00 and by ultrasound there really was no abnormality.  The mass was biopsied the same day and the final pathology ([id number]) showed an invasive mammary carcinoma that was estrogen and progesterone receptor positive, HER-2/neu negative.   On 04-13-03 [name] had bilateral breast MRI's which on the right showed only that single enhancing mass.  The left was fine.  With this information, she was referred to Dr. Cicero Duck and, after appropriate discussion, she proceeded to right lumpectomy and sentinel lymph node biopsy on 04-26-03.  The final pathology there showed a 1.4 cm. infiltrating ductal carcinoma with some lobular features, no evidence of lymphovascular invasion, negative margins, Grade 2 with zero of two lymph nodes involved. Her subsequent treatment is as detailed below  INTERVAL HISTORY: Since her last visit here Drisana has been essentially stable. Her main concern is her husband, Nadine Counts, who is essentially housebound at this point. [name] herself continues to work about 3 days a week.   REVIEW OF SYSTEMS: She is develop bilateral lower extremity neuropathy, left greater than right, which she tells me was confirmed with EE MDs. There is no obvious cause for at that she or I are aware of and she is not on any treatment for it. She has some arthritis symptoms and low back pain which have not changed in intensity or frequency. Overall a detailed review of systems was entirely stable.  PAST MEDICAL HISTORY: Past Medical History  Diagnosis Date  . Asthma   . Depression   . Breast cancer   . Hypertension   . Arthritis   . Diverticulosis of colon   . History of colon polyps   .  Insomnia   Significant for remote tobacco abuse, the patient quitting more than 15 years ago.  She has mild COPD/asthma and some osteoarthritis. She has had some problems following a cervical laminectomy which was contaminated by staph.  She is status English right total hip replacement, status English total abdominal hysterectomy with bilateral salpingo-oophorectomy in 1998, status English cholecystectomy and status English tonsillectomy and adenoidectomy.  PAST SURGICAL HISTORY: Past Surgical History  Procedure Date  . Cervical fusion   . Cervical laminectomy     x4  . Lumbar laminectomy     with spinal stenosis  . Cholecystectomy   . Total hip arthroplasty   . Breast lumpectomy   . Manipulation of hip for dislocation   . Tonsillectomy   . Carpal tunnel release     FAMILY HISTORY Family History  Problem Relation Age of Onset  . Diabetes Father   . Colon cancer Father   . Cancer Father     colon cancer  . Diabetes Brother   . Diabetes Brother   . Arthritis Mother   The patient's father died at the age of 26.  He had colon cancer but did not die from that.  The patient's mother also was 86 when she died.  The patient has two brothers.  There is no history of breast cancer in the family.  GYNECOLOGIC HISTORY: She had three live births, change of life occurred around 55 and she took hormone replacement until this diagnosis was made.   SOCIAL HISTORY: She is a  clinical psychologist.  Her husband, Nadine Counts, is a retired Psychologist, forensic.  The patient's three children are [name] who is an esthetician (does depilation and similar procedures), a son [name] who lives in [place] with two children, and a daughter [name] who lives in [place].  The patient has three grandchildren.    ADVANCED DIRECTIVES:  HEALTH MAINTENANCE: History  Substance Use Topics  . Smoking status: Former Smoker    Quit date: 04/24/1990  . Smokeless tobacco: Not on file  . Alcohol Use: Yes      Colonoscopy:  PAP:  Bone density:  Lipid panel:  Allergies  Allergen Reactions  . Oxycodone Hcl (Roxicodone) Rash  . Morphine Sulfate     REACTION: very anxious  . Sulfamethoxazole     REACTION: unspecified    Current Outpatient Prescriptions  Medication Sig Dispense Refill  . albuterol (PROAIR HFA) 108 (90 BASE) MCG/ACT inhaler Inhale 2 puffs into the lungs as directed.        . Ascorbic Acid (VITAMIN C) 500 MG tablet Take 500 mg by mouth daily.        Marland Kitchen azelastine (ASTELIN) 137 MCG/SPRAY nasal spray Place 2 sprays into the nose 2 (two) times daily. Use in each nostril as directed  30 mL  2  . buPROPion (WELLBUTRIN XL) 300 MG 24 hr tablet TAKE ONE TABLET EVERY DAY  30 tablet  11  . Cholecalciferol (VITAMIN D3) 2000 UNITS TABS Take 2,500 Units by mouth.        . clonazePAM (KLONOPIN) 1 MG tablet Take 1 tablet (1 mg total) by mouth daily.  30 tablet  5  . diazepam (VALIUM) 10 MG tablet Take 1 tablet (10 mg total) by mouth 2 (two) times daily as needed.  60 tablet  5  . HYDROcodone-acetaminophen (VICODIN) 5-500 MG per tablet Take 1 tablet by mouth every 6 (six) hours as needed.        . montelukast (SINGULAIR) 10 MG tablet Take 10 mg by mouth at bedtime.        Marland Kitchen olmesartan-hydrochlorothiazide (BENICAR HCT) 20-12.5 MG per tablet Take 1 tablet by mouth daily.        . temazepam (RESTORIL) 30 MG capsule Take 1 capsule (30 mg total) by mouth at bedtime as needed.  30 capsule  5  . escitalopram (LEXAPRO) 10 MG tablet       . fluticasone (FLONASE) 50 MCG/ACT nasal spray TWO SPRAYS IN NOSTRIL DAILY  16 g  11    OBJECTIVE: Elderly white woman in no acute distress Filed Vitals:   05/06/11 1542  BP: 128/87  Pulse: 81  Temp: 98.4 F (36.9 C)     Body mass index is 26.65 kg/(m^2).    ECOG FS: 1  Sclerae unicteric Oropharynx clear No peripheral adenopathy Lungs no rales or rhonchi Heart regular rate and rhythm Abd benign MSK no focal spinal tenderness, no peripheral  edema Neuro: nonfocal Breasts: Right breast status English lumpectomy. No evidence of local recurrence. The left breast is unremarkable  LAB RESULTS: Lab Results  Component Value Date   WBC 4.7 04/29/2011   NEUTROABS 3.3 04/29/2011   HGB 14.1 04/29/2011   HCT 42.3 04/29/2011   MCV 96.9 04/29/2011   PLT 158 04/29/2011      Chemistry      Component Value Date/Time   NA 136 04/29/2011 1320   K 4.2 04/29/2011 1320   CL 102 04/29/2011 1320   CO2 26 04/29/2011 1320   BUN 16  04/29/2011 1320   CREATININE 0.73 04/29/2011 1320      Component Value Date/Time   CALCIUM 9.3 04/29/2011 1320   ALKPHOS 82 04/29/2011 1320   AST 19 04/29/2011 1320   ALT 18 04/29/2011 1320   BILITOT 0.6 04/29/2011 1320       Lab Results  Component Value Date   LABCA2 13 07/24/2010    No components found with this basename: [id number]    No results found for this basename: INR:1;PROTIME:1 in the last 168 hours  Urinalysis    Component Value Date/Time   COLORURINE YELLOW 01/04/2007 0900   APPEARANCEUR CLEAR 01/04/2007 0900   LABSPEC 1.029 01/04/2007 0900   PHURINE 6.0 01/04/2007 0900   GLUCOSEU NEGATIVE 01/04/2007 0900   HGBUR NEGATIVE 01/04/2007 0900   BILIRUBINUR n 09/25/2010 1525   BILIRUBINUR NEGATIVE 01/04/2007 0900   KETONESUR TRACE* 01/04/2007 0900   PROTEINUR NEGATIVE 01/04/2007 0900   UROBILINOGEN 0.2 09/25/2010 1525   UROBILINOGEN 0.2 01/04/2007 0900   NITRITE n 09/25/2010 1525   NITRITE NEGATIVE 01/04/2007 0900   LEUKOCYTESUR 1+ 09/25/2010 1525    STUDIES: Mammography at [facility] December of 2012 was unchanged  ASSESSMENT: 78 year old [place] woman status English right lumpectomy and sentinel lymph node dissection March 2005 for a T1c N0, stage 1A invasive ductal carcinoma, grade 2,  estrogen and progesterone receptor positive, HER-2 negative, status English MammoSite radiotherapy followed by aromatase inhibitors (initially eczema stain, later letrozole) between April of 2005 and June of 2011.  PLAN: There is  no evidence of disease recurrence. She has established herself now with Dr. Evlyn Kanner and would like me to send him a copy of this dictation which of course I am glad to do. I do not have a simple answer as to her neuropathy. I do not think it is associated with her remote breast cancer diagnosis or its treatment.  She will see me again in one year. She knows to call for any problems that may develop before that visit  Arohi Salvatierra C    05/06/2011

## 2011-05-06 NOTE — Telephone Encounter (Signed)
gave patient appointment for 04-2012 printed out calendar and gave to the patient 

## 2011-05-28 ENCOUNTER — Encounter: Payer: Self-pay | Admitting: Internal Medicine

## 2011-06-18 ENCOUNTER — Encounter: Payer: Self-pay | Admitting: Internal Medicine

## 2011-08-12 ENCOUNTER — Ambulatory Visit (AMBULATORY_SURGERY_CENTER): Payer: Medicare Other | Admitting: *Deleted

## 2011-08-12 ENCOUNTER — Encounter: Payer: Self-pay | Admitting: Internal Medicine

## 2011-08-12 VITALS — Ht 66.0 in | Wt 169.0 lb

## 2011-08-12 DIAGNOSIS — Z1211 Encounter for screening for malignant neoplasm of colon: Secondary | ICD-10-CM

## 2011-08-12 MED ORDER — MOVIPREP 100 G PO SOLR: ORAL | Status: DC

## 2011-08-27 ENCOUNTER — Telehealth: Payer: Self-pay | Admitting: Internal Medicine

## 2011-08-27 NOTE — Telephone Encounter (Signed)
Pt states that she left a urine sample Monday at her PCP and they just called her and told her she has a UTI.  PCP is going to call in another antibiotic( pt states she has finished 2 weeks of a recent antibiotic) but she will not be able to pick it up until after procedure tomorrow. Pt questioned can she still have her procedure tomorrow? Pt instructed yes she can have her procedure. Pt states she has no fever, she feels fine. Told to proceed as ionstructed with prep, diet, etc. Pt returned verbal understanding of instructions given. emccraw rn

## 2011-08-28 ENCOUNTER — Ambulatory Visit (AMBULATORY_SURGERY_CENTER): Payer: Medicare Other | Admitting: Internal Medicine

## 2011-08-28 ENCOUNTER — Encounter: Payer: Self-pay | Admitting: Internal Medicine

## 2011-08-28 VITALS — BP 130/79 | HR 58 | Temp 98.6°F | Resp 18 | Ht 66.0 in | Wt 169.0 lb

## 2011-08-28 DIAGNOSIS — Z86010 Personal history of colonic polyps: Secondary | ICD-10-CM

## 2011-08-28 DIAGNOSIS — Z1211 Encounter for screening for malignant neoplasm of colon: Secondary | ICD-10-CM

## 2011-08-28 DIAGNOSIS — Z800 Family history of malignant neoplasm of digestive organs: Secondary | ICD-10-CM

## 2011-08-28 MED ORDER — SODIUM CHLORIDE 0.9 % IV SOLN: 500.0000 mL | INTRAVENOUS | Status: DC

## 2011-08-28 NOTE — Progress Notes (Signed)
Propofol per j nulty crna. See scanned intra procedure report. ewm 

## 2011-08-28 NOTE — Progress Notes (Signed)
Patient did not experience any of the following events: a burn prior to discharge; a fall within the facility; wrong site/side/patient/procedure/implant event; or a hospital transfer or hospital admission upon discharge from the facility. (G8907) Patient did not have preoperative order for IV antibiotic SSI prophylaxis. (G8918)  

## 2011-08-28 NOTE — Op Note (Signed)
[facility] [address] [place], [place]  [address]  COLONOSCOPY PROCEDURE REPORT  PATIENT:  Mariah, English  MR#:  [id number] BIRTHDATE:  [birth year], 81 yrs. old  GENDER:  female ENDOSCOPIST:  Hedwig Morton. Juanda Chance, MD REF. BY: PROCEDURE DATE:  08/28/2011 PROCEDURE:  Colonoscopy 19147 ASA CLASS:  Class II INDICATIONS:  family history of colon cancer, history of pre-cancerous (adenomatous) colon polyps prior colon 1996,2001,2008-adenom. polyp MEDICATIONS:   MAC sedation, administered by CRNA, propofol (Diprivan) 140 mg  DESCRIPTION OF PROCEDURE:   After the risks and benefits and of the procedure were explained, informed consent was obtained. Digital rectal exam was performed and revealed no rectal masses. The LB [id number] endoscope was introduced through the anus and advanced to the cecum, which was identified by both the appendix and ileocecal valve.  The quality of the prep was good, using MoviPrep.  The instrument was then slowly withdrawn as the colon was fully examined. <<PROCEDUREIMAGES>>  FINDINGS:  Scattered diverticula were found in the sigmoid colon (see image1, image4, and image5).  This was otherwise a normal examination of the colon (see image2, image3, and image6). Internal Hemorrhoids were found (see image7).   Retroflexed views in the rectum revealed no abnormalities.    The scope was then withdrawn from the patient and the procedure completed.  COMPLICATIONS:  None ENDOSCOPIC IMPRESSION: 1) Diverticula, scattered in the sigmoid colon 2) Otherwise normal examination 3) Internal hemorrhoids RECOMMENDATIONS: 1) High fiber diet.  REPEAT EXAM:  In 0 year(s) for.  no recall due to age  ______________________________ Hedwig Morton. Juanda Chance, MD  CC:  n. eSIGNED:   Hedwig Morton. Carissa Musick at 08/28/2011 10:41 AM  Harle Stanford, [phone]

## 2011-08-28 NOTE — Patient Instructions (Addendum)
Discharge instructions given with verbal understanding. Handouts on diverticulosis and hemorrhoids given. Resume previous medications. YOU HAD AN ENDOSCOPIC PROCEDURE TODAY AT THE [facility]: Refer to the procedure report that was given to you for any specific questions about what was found during the examination.  If the procedure report does not answer your questions, please call your gastroenterologist to clarify.  If you requested that your care partner not be given the details of your procedure findings, then the procedure report has been included in a sealed envelope for you to review at your convenience later.  YOU SHOULD EXPECT: Some feelings of bloating in the abdomen. Passage of more gas than usual.  Walking can help get rid of the air that was put into your GI tract during the procedure and reduce the bloating. If you had a lower endoscopy (such as a colonoscopy or flexible sigmoidoscopy) you may notice spotting of blood in your stool or on the toilet paper. If you underwent a bowel prep for your procedure, then you may not have a normal bowel movement for a few days.  DIET: Your first meal following the procedure should be a light meal and then it is ok to progress to your normal diet.  A half-sandwich or bowl of soup is an example of a good first meal.  Heavy or fried foods are harder to digest and may make you feel nauseous or bloated.  Likewise meals heavy in dairy and vegetables can cause extra gas to form and this can also increase the bloating.  Drink plenty of fluids but you should avoid alcoholic beverages for 24 hours.  ACTIVITY: Your care partner should take you home directly after the procedure.  You should plan to take it easy, moving slowly for the rest of the day.  You can resume normal activity the day after the procedure however you should NOT DRIVE or use heavy machinery for 24 hours (because of the sedation medicines used during the test).    SYMPTOMS TO REPORT  IMMEDIATELY: A gastroenterologist can be reached at any hour.  During normal business hours, 8:30 AM to 5:00 PM Monday through Friday, call [phone].  After hours and on weekends, please call the GI answering service at [phone] who will take a message and have the physician on call contact you.   Following lower endoscopy (colonoscopy or flexible sigmoidoscopy):  Excessive amounts of blood in the stool  Significant tenderness or worsening of abdominal pains  Swelling of the abdomen that is new, acute  Fever of 100F or higher  FOLLOW UP: If any biopsies were taken you will be contacted by phone or by letter within the next 1-3 weeks.  Call your gastroenterologist if you have not heard about the biopsies in 3 weeks.  Our staff will call the home number listed on your records the next business day following your procedure to check on you and address any questions or concerns that you may have at that time regarding the information given to you following your procedure. This is a courtesy call and so if there is no answer at the home number and we have not heard from you through the emergency physician on call, we will assume that you have returned to your regular daily activities without incident.  SIGNATURES/CONFIDENTIALITY: You and/or your care partner have signed paperwork which will be entered into your electronic medical record.  These signatures attest to the fact that that the information above on your After Visit   Summary has been reviewed and is understood.  Full responsibility of the confidentiality of this discharge information lies with you and/or your care-partner. 

## 2011-08-31 ENCOUNTER — Telehealth: Payer: Self-pay | Admitting: *Deleted

## 2011-08-31 NOTE — Telephone Encounter (Signed)
  Follow up Call-  Call back number 08/28/2011  Post procedure Call Back phone  # [phone]  Permission to leave phone message Yes     Patient questions:  Do you have a fever, pain , or abdominal swelling? no Pain Score  0 *  Have you tolerated food without any problems? yes  Have you been able to return to your normal activities? yes  Do you have any questions about your discharge instructions: Diet   no Medications  no Follow up visit  no  Do you have questions or concerns about your Care? no  Actions: * If pain score is 4 or above: No action needed, pain <4.

## 2011-09-28 ENCOUNTER — Emergency Department (HOSPITAL_COMMUNITY)
Admission: EM | Admit: 2011-09-28 | Discharge: 2011-09-28 | Disposition: A | Discharge status: Home / Self Care | Payer: Medicare Other | Attending: Emergency Medicine | Admitting: Emergency Medicine

## 2011-09-28 ENCOUNTER — Encounter (HOSPITAL_COMMUNITY): Payer: Self-pay | Admitting: *Deleted

## 2011-09-28 ENCOUNTER — Emergency Department (HOSPITAL_COMMUNITY): Payer: Medicare Other

## 2011-09-28 DIAGNOSIS — Z981 Arthrodesis status: Secondary | ICD-10-CM | POA: Insufficient documentation

## 2011-09-28 DIAGNOSIS — Z853 Personal history of malignant neoplasm of breast: Secondary | ICD-10-CM | POA: Insufficient documentation

## 2011-09-28 DIAGNOSIS — Z87891 Personal history of nicotine dependence: Secondary | ICD-10-CM | POA: Insufficient documentation

## 2011-09-28 DIAGNOSIS — Y9301 Activity, walking, marching and hiking: Secondary | ICD-10-CM | POA: Insufficient documentation

## 2011-09-28 DIAGNOSIS — M129 Arthropathy, unspecified: Secondary | ICD-10-CM | POA: Insufficient documentation

## 2011-09-28 DIAGNOSIS — G4700 Insomnia, unspecified: Secondary | ICD-10-CM | POA: Insufficient documentation

## 2011-09-28 DIAGNOSIS — I10 Essential (primary) hypertension: Secondary | ICD-10-CM | POA: Insufficient documentation

## 2011-09-28 DIAGNOSIS — Y998 Other external cause status: Secondary | ICD-10-CM | POA: Insufficient documentation

## 2011-09-28 DIAGNOSIS — S2239XA Fracture of one rib, unspecified side, initial encounter for closed fracture: Secondary | ICD-10-CM

## 2011-09-28 DIAGNOSIS — Y92009 Unspecified place in unspecified non-institutional (private) residence as the place of occurrence of the external cause: Secondary | ICD-10-CM | POA: Insufficient documentation

## 2011-09-28 DIAGNOSIS — S2249XA Multiple fractures of ribs, unspecified side, initial encounter for closed fracture: Secondary | ICD-10-CM | POA: Insufficient documentation

## 2011-09-28 DIAGNOSIS — W1809XA Striking against other object with subsequent fall, initial encounter: Secondary | ICD-10-CM | POA: Insufficient documentation

## 2011-09-28 MED ORDER — HYDROCODONE-ACETAMINOPHEN 5-500 MG PO TABS: 1.0000 | ORAL_TABLET | Freq: Four times a day (QID) | ORAL | Status: AC | PRN

## 2011-09-28 NOTE — ED Provider Notes (Signed)
Medical screening examination/treatment/procedure(s) were performed by non-physician practitioner and as supervising physician I was immediately available for consultation/collaboration.  Cheri Guppy, MD 09/28/11 [phone]

## 2011-09-28 NOTE — ED Notes (Signed)
Patient transported to X-ray 

## 2011-09-28 NOTE — ED Provider Notes (Signed)
History     CSN: [id number]  Arrival date & time 09/28/11  0841   First MD Initiated Contact with Patient 09/28/11 0845      Chief Complaint  Patient presents with  . Fall  . Chest Pain    left    (Consider location/radiation/quality/duration/timing/severity/associated sxs/prior treatment) HPI Comments: 78 y/o female presents with left sided rib pain s/p fall around 11:00 pm last night. States she was walking from her bedroom to the bathroom and slipped, fell, and hit her left side of chest on a wooden stool. Denies hitting her head or any LOC. States for about 30 seconds it was hard to catch her breath, but has not had that problem since. She then got up and went back into bed with ice which made the pain worse. Her pain at that time was 12/10. She then took two hydrocodone and applied heat which brought her pain level down. Pain still present this morning. She took a hydrocodone a half hour ago so her pain level is not terrible. Pain worse with taking a deep breath. Denies any sob. She does not take any blood thinners.  Patient is a 78 y.o. female presenting with fall and chest pain. The history is provided by the patient.  Fall  Chest Pain Pertinent negatives for primary symptoms include no shortness of breath.     Past Medical History  Diagnosis Date  . Asthma   . Depression   . Hypertension   . Arthritis   . Diverticulosis of colon   . History of colon polyps   . Insomnia   . Breast cancer 2006    right    Past Surgical History  Procedure Date  . Cervical fusion   . Cervical laminectomy     x4  . Lumbar laminectomy     with spinal stenosis  . Cholecystectomy   . Total hip arthroplasty   . Breast lumpectomy   . Manipulation of hip for dislocation   . Tonsillectomy   . Carpal tunnel release     Family History  Problem Relation Age of Onset  . Diabetes Father   . Colon cancer Father 15  . Cancer Father     colon cancer  . Diabetes Brother   . Diabetes  Brother   . Arthritis Mother     History  Substance Use Topics  . Smoking status: Former Smoker    Quit date: 04/24/1990  . Smokeless tobacco: Never Used  . Alcohol Use: 2.4 oz/week    4 Glasses of wine per week     occ    OB History    Grav Para Term Preterm Abortions TAB SAB Ect Mult Living                  Review of Systems  Respiratory: Negative for shortness of breath.   Cardiovascular: Positive for chest pain (rib pain).  Musculoskeletal:       Positive for left sided rib pain  Neurological:       Denies any LOC    Allergies  Oxycodone hcl; Morphine sulfate; and Sulfamethoxazole  Home Medications   Current Outpatient Rx  Name Route Sig Dispense Refill  . ALBUTEROL SULFATE HFA 108 (90 BASE) MCG/ACT IN AERS Inhalation Inhale 2 puffs into the lungs as directed.      . AZELASTINE HCL 137 MCG/SPRAY NA SOLN Nasal Place 2 sprays into the nose 2 (two) times daily. Use in each nostril as directed 30 mL 2  .  BUPROPION HCL ER (XL) 300 MG PO TB24  TAKE ONE TABLET EVERY DAY 30 tablet 11  . VITAMIN D3 2000 UNITS PO TABS Oral Take 2,500 Units by mouth.      . CLONAZEPAM 1 MG PO TABS Oral Take 1 tablet (1 mg total) by mouth daily. 30 tablet 5  . DIAZEPAM 10 MG PO TABS Oral Take 1 tablet (10 mg total) by mouth 2 (two) times daily as needed. 60 tablet 5  . ESCITALOPRAM OXALATE 10 MG PO TABS Oral Take 10 mg by mouth daily.     Marland Kitchen FLUTICASONE PROPIONATE 50 MCG/ACT NA SUSP  TWO SPRAYS IN NOSTRIL DAILY 16 g 11  . GABAPENTIN 100 MG PO CAPS Oral Take 100 mg by mouth daily.    Marland Kitchen HYDROCODONE-ACETAMINOPHEN 5-500 MG PO TABS Oral Take 1 tablet by mouth every 6 (six) hours as needed.      Marland Kitchen MONTELUKAST SODIUM 10 MG PO TABS Oral Take 10 mg by mouth at bedtime.      Marland Kitchen OLMESARTAN MEDOXOMIL-HCTZ 20-12.5 MG PO TABS Oral Take 1 tablet by mouth daily.      Marland Kitchen LYRICA PO Oral Take by mouth daily.    Marland Kitchen TEMAZEPAM 30 MG PO CAPS Oral Take 1 capsule (30 mg total) by mouth at bedtime as needed. 30 capsule 5      BP 110/92  Pulse 60  Temp 98.1 F (36.7 C) (Oral)  Resp 18  SpO2 98%  Physical Exam  Constitutional: She is oriented to person, place, and time. She appears well-developed and well-nourished. No distress.  HENT:  Head: Normocephalic and atraumatic.  Eyes: Conjunctivae and EOM are normal. Pupils are equal, round, and reactive to light.  Neck: Normal range of motion. Neck supple.  Cardiovascular: Normal rate, regular rhythm and normal heart sounds.   Pulmonary/Chest: Breath sounds normal. Not tachypneic and not bradypneic. No respiratory distress. She has no decreased breath sounds. She exhibits bony tenderness (over left posterior/lateral 8-9 rib area). She exhibits no edema, no deformity and no swelling.       Pain with deep inspiration  Abdominal: Soft. Bowel sounds are normal. There is no tenderness.  Neurological: She is alert and oriented to person, place, and time.  Skin: Skin is warm and dry. No abrasion, no bruising, no ecchymosis and no laceration noted.  Psychiatric: She has a normal mood and affect. Her behavior is normal.    ED Course  Procedures (including critical care time)  Labs Reviewed - No data to display Dg Ribs Unilateral W/chest Left  09/28/2011  *RADIOLOGY REPORT*  Clinical Data: Fall.  Left-sided rib pain.  LEFT RIBS AND CHEST - 3+ VIEW  Comparison: 11/12/2008  Findings: Heart size is normal.  Mediastinal shadows are normal. The lungs are clear.  No pneumothorax or hemothorax.  Skin marker is in the region of the lower left lateral ribs.  There are fractures of the left seventh, eighth and ninth ribs in that region.  No significant displacement.  IMPRESSION: Fractures of the left seventh, eighth and ninth ribs posterolaterally.  Original Report Authenticated By: Thomasenia Sales, M.D.     1. Rib fractures       MDM  78 y/o female with rib fractures of left 7th, 8th, and 9th ribs. No pneumothorax or hemothorax. Patient is not SOB and in NAD. Incentive  spirometry given. Discharge with pain control, instructions to ice, and follow up with PCP. Advised return to ED if she becomes SOB or has trouble breathing.  Case discussed with Dr. Weldon Inches who agrees with plan of care.       Trevor Mace, PA-C 09/28/11 1014

## 2011-09-28 NOTE — ED Notes (Signed)
Pt fell directly into a wooden stand and hit on left rib side and states when she hit it took the breath out of her and she could not even yell for help.  Pt feels pain in left kidney and rib area

## 2011-10-21 ENCOUNTER — Ambulatory Visit: Payer: Medicare Other

## 2011-10-21 ENCOUNTER — Ambulatory Visit (INDEPENDENT_AMBULATORY_CARE_PROVIDER_SITE_OTHER): Payer: Medicare Other | Admitting: Emergency Medicine

## 2011-10-21 VITALS — BP 130/78 | HR 77 | Temp 99.0°F | Resp 20 | Ht 66.5 in | Wt 174.4 lb

## 2011-10-21 DIAGNOSIS — M25519 Pain in unspecified shoulder: Secondary | ICD-10-CM

## 2011-10-21 DIAGNOSIS — M25559 Pain in unspecified hip: Secondary | ICD-10-CM

## 2011-10-21 NOTE — Progress Notes (Signed)
  Subjective:    Patient ID: KALILA ADKISON, female    DOB: 08-Feb-1931, 78 y.o.   MRN: [id number]  HPI patient was in her usual state of health until 4 days ago when she tripped and fell falling directly on her right hip. She now has pain and discomfort with weightbearing. She is status post surgery on the right hip. She has had recurrent dislocations of the hip and has had repeat surgery to keep her hip in place    Review of Systems     Objective:   Physical Exam There is a small hematoma over the greater tuberosity of the right shoulder. Is also a 2 x 2 centimeter hematoma over the right greater trochanter there is good range of motion of the right hip with a well-healed scar from a previous surgical treatment.  [facility] reading (PRIMARY) by  Dr.Evan Mackie patient is status post bilateral hip replacements. There is also a band around hip replacement of the right hip. I did not see signs of an acute fracture. The shoulder film is normal.       Assessment & Plan: Testing. Case discussed with radiologist. No fracture seen. Patient advised reduce CT scan of the hip if she has persistent pain to

## 2011-11-16 ENCOUNTER — Encounter: Payer: Self-pay | Admitting: Oncology

## 2011-11-16 ENCOUNTER — Other Ambulatory Visit: Payer: Self-pay | Admitting: Oncology

## 2011-11-16 ENCOUNTER — Telehealth: Payer: Self-pay | Admitting: Oncology

## 2011-11-16 NOTE — Telephone Encounter (Signed)
Sent letter to pt from Dr.Magrinat. °

## 2011-11-25 ENCOUNTER — Other Ambulatory Visit: Payer: Self-pay | Admitting: Emergency Medicine

## 2011-11-25 ENCOUNTER — Telehealth: Payer: Self-pay | Admitting: Oncology

## 2011-11-25 ENCOUNTER — Other Ambulatory Visit: Payer: Self-pay | Admitting: Oncology

## 2011-11-25 DIAGNOSIS — C50919 Malignant neoplasm of unspecified site of unspecified female breast: Secondary | ICD-10-CM

## 2011-11-25 DIAGNOSIS — Z853 Personal history of malignant neoplasm of breast: Secondary | ICD-10-CM

## 2011-11-25 NOTE — Telephone Encounter (Signed)
lmonvm adviisng the pt of her mri abdomen appt along with the lab appt prior.

## 2011-11-27 ENCOUNTER — Other Ambulatory Visit: Payer: Self-pay | Admitting: *Deleted

## 2011-11-28 ENCOUNTER — Other Ambulatory Visit: Payer: Self-pay | Admitting: Oncology

## 2011-12-01 ENCOUNTER — Other Ambulatory Visit (HOSPITAL_COMMUNITY): Payer: Medicare Other

## 2011-12-02 ENCOUNTER — Other Ambulatory Visit: Payer: Self-pay | Admitting: Oncology

## 2011-12-02 ENCOUNTER — Other Ambulatory Visit (HOSPITAL_BASED_OUTPATIENT_CLINIC_OR_DEPARTMENT_OTHER): Payer: Medicare Other | Admitting: Lab

## 2011-12-02 ENCOUNTER — Ambulatory Visit (HOSPITAL_COMMUNITY)
Admission: RE | Admit: 2011-12-02 | Discharge: 2011-12-02 | Disposition: A | Discharge status: Home / Self Care | Payer: Medicare Other | Source: Ambulatory Visit | Attending: Oncology | Admitting: Oncology

## 2011-12-02 DIAGNOSIS — K838 Other specified diseases of biliary tract: Secondary | ICD-10-CM | POA: Insufficient documentation

## 2011-12-02 DIAGNOSIS — Z9089 Acquired absence of other organs: Secondary | ICD-10-CM | POA: Insufficient documentation

## 2011-12-02 DIAGNOSIS — Z853 Personal history of malignant neoplasm of breast: Secondary | ICD-10-CM

## 2011-12-02 DIAGNOSIS — C50919 Malignant neoplasm of unspecified site of unspecified female breast: Secondary | ICD-10-CM

## 2011-12-02 DIAGNOSIS — R932 Abnormal findings on diagnostic imaging of liver and biliary tract: Secondary | ICD-10-CM | POA: Insufficient documentation

## 2011-12-02 LAB — BASIC METABOLIC PANEL (CC13)
BUN: 13 mg/dL (ref 7.0–26.0)
CO2: 28 mEq/L (ref 22–29)
Calcium: 10.1 mg/dL (ref 8.4–10.4)
Glucose: 107 mg/dl — ABNORMAL HIGH (ref 70–99)
Sodium: 137 mEq/L (ref 136–145)

## 2011-12-02 MED ORDER — GADOBENATE DIMEGLUMINE 529 MG/ML IV SOLN
16.0000 mL | Freq: Once | INTRAVENOUS | Status: AC | PRN
  Administered 2011-12-02: 16 mL via INTRAVENOUS

## 2011-12-24 ENCOUNTER — Telehealth: Payer: Self-pay | Admitting: Internal Medicine

## 2011-12-24 NOTE — Telephone Encounter (Signed)
Left a message for Judeth Cornfield to call me.

## 2011-12-25 NOTE — Telephone Encounter (Signed)
Received a message from [place] with Pain Management that she no longer needs an appointment to be scheduled.

## 2012-04-06 ENCOUNTER — Telehealth: Payer: Self-pay

## 2012-04-06 NOTE — Telephone Encounter (Signed)
RN w/[employer] calling to f/up on pt after Fx. She stated that the [place] Guidelines for pt's over 66 is that they have a bone density test or a prescription w/in 6 mos following Fx. I advised RN that pt is on a calcium/vit D3 supplement but was Rxd by another provider so I am not sure if pt is currently taking this. Dr Cleta Alberts, it looks like [employer] is probably notifying you because you saw pt for f/up after rib fxs, but maybe Dr Darryll Capers is maybe her PCP? This is FYI, we do not need to call [employer] back.

## 2012-04-07 NOTE — Telephone Encounter (Signed)
Noted  

## 2012-04-27 ENCOUNTER — Other Ambulatory Visit (HOSPITAL_BASED_OUTPATIENT_CLINIC_OR_DEPARTMENT_OTHER): Payer: Medicare Other | Admitting: Lab

## 2012-04-27 DIAGNOSIS — Z853 Personal history of malignant neoplasm of breast: Secondary | ICD-10-CM

## 2012-04-27 LAB — CBC WITH DIFFERENTIAL/PLATELET
BASO%: 0.6 % (ref 0.0–2.0)
EOS%: 2.4 % (ref 0.0–7.0)
HCT: 40.6 % (ref 34.8–46.6)
LYMPH%: 18.7 % (ref 14.0–49.7)
MCH: 32.4 pg (ref 25.1–34.0)
MCHC: 33.6 g/dL (ref 31.5–36.0)
NEUT%: 68.3 % (ref 38.4–76.8)
Platelets: 175 10*3/uL (ref 145–400)
RBC: 4.21 10*6/uL (ref 3.70–5.45)
lymph#: 0.9 10*3/uL (ref 0.9–3.3)

## 2012-04-27 LAB — COMPREHENSIVE METABOLIC PANEL (CC13)
ALT: 23 U/L (ref 0–55)
AST: 25 U/L (ref 5–34)
Alkaline Phosphatase: 88 U/L (ref 40–150)
Creatinine: 0.8 mg/dL (ref 0.6–1.1)
Total Bilirubin: 0.42 mg/dL (ref 0.20–1.20)

## 2012-05-04 ENCOUNTER — Ambulatory Visit (HOSPITAL_BASED_OUTPATIENT_CLINIC_OR_DEPARTMENT_OTHER): Payer: Medicare Other | Admitting: Oncology

## 2012-05-04 ENCOUNTER — Telehealth: Payer: Self-pay | Admitting: Oncology

## 2012-05-04 VITALS — BP 137/79 | HR 98 | Temp 97.6°F | Resp 20 | Ht 66.5 in | Wt 183.0 lb

## 2012-05-04 DIAGNOSIS — M899 Disorder of bone, unspecified: Secondary | ICD-10-CM

## 2012-05-04 DIAGNOSIS — M949 Disorder of cartilage, unspecified: Secondary | ICD-10-CM

## 2012-05-04 DIAGNOSIS — Z853 Personal history of malignant neoplasm of breast: Secondary | ICD-10-CM

## 2012-05-04 NOTE — Telephone Encounter (Signed)
gv pt appt schedule for March. °

## 2012-05-04 NOTE — Progress Notes (Signed)
ID: Valla Leaver   DOB: 1930/06/30  MR#: [id number]  VHQ#:[id number]  HISTORY OF PRESENT ILLNESS: [name] had a routine mammogram on 03-30-07 at [facility] which showed a possible mass at approximately 9:00 in the right breast.  On 04-05-03 she had spot compression views confirming a 1.5 cm. irregular mass at 9:00 and by ultrasound there really was no abnormality.  The mass was biopsied the same day and the final pathology ([id number]) showed an invasive mammary carcinoma that was estrogen and progesterone receptor positive, HER-2/neu negative.   On 04-13-03 [name] had bilateral breast MRI's which on the right showed only that single enhancing mass.  The left was fine.  With this information, she was referred to Dr. Cicero Duck and, after appropriate discussion, she proceeded to right lumpectomy and sentinel lymph node biopsy on 04-26-03.  The final pathology there showed a 1.4 cm. infiltrating ductal carcinoma with some lobular features, no evidence of lymphovascular invasion, negative margins, Grade 2 with zero of two lymph nodes involved. Her subsequent treatment is as detailed below  INTERVAL HISTORY: [name] returns today for followup of her remote breast cancer. Interval history has been generally unremarkable. She did have an episode of pneumonia which was treated and evaluated and has resolved. She tells me her husband [name] is inconstant pain and is out of bed only about 6 hours every day. The children are doing fine and the rest of the family as well. She works about 6 hours a day 3 days a week.   REVIEW OF SYSTEMS: She tells me her lower extremity idiopathic neuropathy cleared with the help of Dr. Wilhelmenia Blase in [place] [facility]. [name], describes herself as mildly fatigued, and has still some chronic back pain. She is going to rehabilitation on a regular basis and that is helping. She has mild sinus symptoms. She is developing some heartburn, which is very  inconstant so she is not sure she wants to take Prilosec for that. She had a urinary tract infection recent lyt. A detailed review of systems today was otherwise noncontributory  PAST MEDICAL HISTORY: Past Medical History  Diagnosis Date  . Asthma   . Depression   . Hypertension   . Arthritis   . Diverticulosis of colon   . History of colon polyps   . Insomnia   . Breast cancer 2006    right  Significant for remote tobacco abuse, the patient quitting more than 15 years ago.  She has mild COPD/asthma and some osteoarthritis. She has had some problems following a cervical laminectomy which was contaminated by staph.  She is status English right total hip replacement, status English total abdominal hysterectomy with bilateral salpingo-oophorectomy in 1998, status English cholecystectomy and status English tonsillectomy and adenoidectomy.  PAST SURGICAL HISTORY: Past Surgical History  Procedure Laterality Date  . Cervical fusion    . Cervical laminectomy      x4  . Lumbar laminectomy      with spinal stenosis  . Cholecystectomy    . Total hip arthroplasty    . Breast lumpectomy    . Manipulation of hip for dislocation    . Tonsillectomy    . Carpal tunnel release      FAMILY HISTORY Family History  Problem Relation Age of Onset  . Diabetes Father   . Colon cancer Father 28  . Cancer Father     colon cancer  . Diabetes Brother   . Diabetes Brother   . Arthritis Mother  The patient's father died at the age of 47.  He had colon cancer but did not die from that.  The patient's mother also was 48 when she died.  The patient has two brothers.  There is no history of breast cancer in the family.  GYNECOLOGIC HISTORY: She had three live births, change of life occurred around 47 and she took hormone replacement until this diagnosis was made.   SOCIAL HISTORY: She is a Counsellor.  Her husband, [name], is a retired Psychologist, forensic.  The patient's three children are [name] who is an esthetician (does depilation and similar procedures), a son [name] who lives in [place] with two children, and a daughter [name] who lives in [place].  The patient has three grandchildren.    ADVANCED DIRECTIVES: In place  HEALTH MAINTENANCE: History  Substance Use Topics  . Smoking status: Former Smoker    Quit date: 04/24/1990  . Smokeless tobacco: Never Used  . Alcohol Use: 2.4 oz/week    4 Glasses of wine per week     Comment: occ     Colonoscopy:  PAP:  Bone density:  Lipid panel:  Allergies  Allergen Reactions  . Oxycodone Hcl (Oxycodone Hcl) Rash  . Morphine Sulfate     REACTION: very anxious  . Sulfamethoxazole     REACTION: unspecified    Current Outpatient Prescriptions  Medication Sig Dispense Refill  . albuterol (PROAIR HFA) 108 (90 BASE) MCG/ACT inhaler Inhale 2 puffs into the lungs as directed.        Marland Kitchen azelastine (ASTELIN) 137 MCG/SPRAY nasal spray Place 2 sprays into the nose 2 (two) times daily. Use in each nostril as directed  30 mL  2  . buPROPion (WELLBUTRIN XL) 300 MG 24 hr tablet TAKE ONE TABLET EVERY DAY  30 tablet  11  . Cholecalciferol (VITAMIN D3) 2000 UNITS TABS Take 2,500 Units by mouth.        . clonazePAM (KLONOPIN) 1 MG tablet Take 1 tablet (1 mg total) by mouth daily.  30 tablet  5  . diazepam (VALIUM) 10 MG tablet Take 1 tablet (10 mg total) by mouth 2 (two) times daily as needed.  60 tablet  5  . escitalopram (LEXAPRO) 10 MG tablet Take 10 mg by mouth daily.       . fluticasone (FLONASE) 50 MCG/ACT nasal spray TWO SPRAYS IN NOSTRIL DAILY  16 g  11  . HYDROcodone-acetaminophen (VICODIN) 5-500 MG per tablet Take 1 tablet by mouth every 6 (six) hours as needed.        . montelukast (SINGULAIR) 10 MG tablet Take 10 mg by mouth at bedtime.        Marland Kitchen olmesartan-hydrochlorothiazide (BENICAR HCT) 20-12.5 MG per tablet Take 1 tablet by mouth daily.        . temazepam (RESTORIL) 30 MG capsule Take 1 capsule (30 mg total) by mouth at  bedtime as needed.  30 capsule  5   No current facility-administered medications for this visit.    OBJECTIVE: Elderly white woman in no acute distress Filed Vitals:   05/04/12 1530  BP: 137/79  Pulse: 98  Temp: 97.6 F (36.4 C)  Resp: 20     Body mass index is 29.1 kg/(m^2).    ECOG FS: 1  Sclerae unicteric Oropharynx clear No peripheral adenopathy Lungs no rales or rhonchi Heart regular rate and rhythm Abd benign MSK kyphosis and scoliosis but no focal spinal tenderness,  Neuro: nonfocal, well oriented,  appropriate affect Breasts: Right breast status English lumpectomy. No evidence of local recurrence. The right excellent is benign. The left breast is unremarkable  LAB RESULTS: Lab Results  Component Value Date   WBC 4.6 04/27/2012   NEUTROABS 3.1 04/27/2012   HGB 13.6 04/27/2012   HCT 40.6 04/27/2012   MCV 96.5 04/27/2012   PLT 175 04/27/2012      Chemistry      Component Value Date/Time   NA 133* 04/27/2012 1442   NA 136 04/29/2011 1320   K 3.8 04/27/2012 1442   K 4.2 04/29/2011 1320   CL 97* 04/27/2012 1442   CL 102 04/29/2011 1320   CO2 28 04/27/2012 1442   CO2 26 04/29/2011 1320   BUN 13.7 04/27/2012 1442   BUN 16 04/29/2011 1320   CREATININE 0.8 04/27/2012 1442   CREATININE 0.73 04/29/2011 1320      Component Value Date/Time   CALCIUM 9.2 04/27/2012 1442   CALCIUM 9.3 04/29/2011 1320   ALKPHOS 88 04/27/2012 1442   ALKPHOS 82 04/29/2011 1320   AST 25 04/27/2012 1442   AST 19 04/29/2011 1320   ALT 23 04/27/2012 1442   ALT 18 04/29/2011 1320   BILITOT 0.42 04/27/2012 1442   BILITOT 0.6 04/29/2011 1320       Lab Results  Component Value Date   LABCA2 13 07/24/2010    No components found with this basename: [id number]    No results found for this basename: INR,  in the last 168 hours  Urinalysis    Component Value Date/Time   COLORURINE YELLOW 01/04/2007 0900   APPEARANCEUR CLEAR 01/04/2007 0900   LABSPEC 1.029 01/04/2007 0900   PHURINE 6.0 01/04/2007 0900    GLUCOSEU NEGATIVE 01/04/2007 0900   HGBUR NEGATIVE 01/04/2007 0900   BILIRUBINUR n 09/25/2010 1525   BILIRUBINUR NEGATIVE 01/04/2007 0900   KETONESUR TRACE* 01/04/2007 0900   PROTEINUR NEGATIVE 01/04/2007 0900   UROBILINOGEN 0.2 09/25/2010 1525   UROBILINOGEN 0.2 01/04/2007 0900   NITRITE n 09/25/2010 1525   NITRITE NEGATIVE 01/04/2007 0900   LEUKOCYTESUR 1+ 09/25/2010 1525    STUDIES: Right breast mammogram and ultrasound January to 2014 was unremarkable. DEXA scan 03/03/2011 showed osteopenia, with the left forearm T. value of -1.7  ASSESSMENT: 79 y.o.  woman status English right lumpectomy and sentinel lymph node dissection March 2005 for a T1c N0, stage 1A invasive ductal carcinoma, grade 2,  estrogen and progesterone receptor positive, HER-2 negative, status English MammoSite radiotherapy followed by aromatase inhibitors (initially exemestane, later letrozole) between April of 2005 and June of 2011.  PLAN: [name] is 27, and she is feeling some of that, but as far as breast cancer is concerned there is no evidence of disease recurrence now 9 years out from her original surgery. This is very favorable.  She is going to return to see me in one year. She knows to call for any problems that may develop before the next visit.  [name] C    05/04/2012

## 2012-06-21 ENCOUNTER — Encounter: Payer: Self-pay | Admitting: Nurse Practitioner

## 2012-06-22 ENCOUNTER — Ambulatory Visit: Payer: Medicare Other | Admitting: Nurse Practitioner

## 2012-07-03 ENCOUNTER — Encounter (HOSPITAL_COMMUNITY): Payer: Self-pay | Admitting: *Deleted

## 2012-07-03 ENCOUNTER — Emergency Department (HOSPITAL_COMMUNITY): Payer: Medicare Other

## 2012-07-03 ENCOUNTER — Emergency Department (HOSPITAL_COMMUNITY)
Admission: EM | Admit: 2012-07-03 | Discharge: 2012-07-03 | Disposition: A | Discharge status: Home / Self Care | Payer: Medicare Other | Attending: Emergency Medicine | Admitting: Emergency Medicine

## 2012-07-03 DIAGNOSIS — Z981 Arthrodesis status: Secondary | ICD-10-CM | POA: Insufficient documentation

## 2012-07-03 DIAGNOSIS — Z79899 Other long term (current) drug therapy: Secondary | ICD-10-CM | POA: Insufficient documentation

## 2012-07-03 DIAGNOSIS — Z860100 Personal history of colon polyps, unspecified: Secondary | ICD-10-CM | POA: Insufficient documentation

## 2012-07-03 DIAGNOSIS — J45909 Unspecified asthma, uncomplicated: Secondary | ICD-10-CM | POA: Insufficient documentation

## 2012-07-03 DIAGNOSIS — Z8742 Personal history of other diseases of the female genital tract: Secondary | ICD-10-CM | POA: Insufficient documentation

## 2012-07-03 DIAGNOSIS — E119 Type 2 diabetes mellitus without complications: Secondary | ICD-10-CM | POA: Insufficient documentation

## 2012-07-03 DIAGNOSIS — Z7982 Long term (current) use of aspirin: Secondary | ICD-10-CM | POA: Insufficient documentation

## 2012-07-03 DIAGNOSIS — Z96649 Presence of unspecified artificial hip joint: Secondary | ICD-10-CM | POA: Insufficient documentation

## 2012-07-03 DIAGNOSIS — M899 Disorder of bone, unspecified: Secondary | ICD-10-CM | POA: Insufficient documentation

## 2012-07-03 DIAGNOSIS — Y9389 Activity, other specified: Secondary | ICD-10-CM | POA: Insufficient documentation

## 2012-07-03 DIAGNOSIS — Y929 Unspecified place or not applicable: Secondary | ICD-10-CM | POA: Insufficient documentation

## 2012-07-03 DIAGNOSIS — Z8639 Personal history of other endocrine, nutritional and metabolic disease: Secondary | ICD-10-CM | POA: Insufficient documentation

## 2012-07-03 DIAGNOSIS — F3289 Other specified depressive episodes: Secondary | ICD-10-CM | POA: Insufficient documentation

## 2012-07-03 DIAGNOSIS — M129 Arthropathy, unspecified: Secondary | ICD-10-CM | POA: Insufficient documentation

## 2012-07-03 DIAGNOSIS — G4700 Insomnia, unspecified: Secondary | ICD-10-CM | POA: Insufficient documentation

## 2012-07-03 DIAGNOSIS — Z853 Personal history of malignant neoplasm of breast: Secondary | ICD-10-CM | POA: Insufficient documentation

## 2012-07-03 DIAGNOSIS — W010XXA Fall on same level from slipping, tripping and stumbling without subsequent striking against object, initial encounter: Secondary | ICD-10-CM | POA: Insufficient documentation

## 2012-07-03 DIAGNOSIS — S92301A Fracture of unspecified metatarsal bone(s), right foot, initial encounter for closed fracture: Secondary | ICD-10-CM

## 2012-07-03 DIAGNOSIS — S92309A Fracture of unspecified metatarsal bone(s), unspecified foot, initial encounter for closed fracture: Secondary | ICD-10-CM | POA: Insufficient documentation

## 2012-07-03 DIAGNOSIS — M949 Disorder of cartilage, unspecified: Secondary | ICD-10-CM | POA: Insufficient documentation

## 2012-07-03 DIAGNOSIS — Z87891 Personal history of nicotine dependence: Secondary | ICD-10-CM | POA: Insufficient documentation

## 2012-07-03 DIAGNOSIS — I10 Essential (primary) hypertension: Secondary | ICD-10-CM | POA: Insufficient documentation

## 2012-07-03 DIAGNOSIS — Z862 Personal history of diseases of the blood and blood-forming organs and certain disorders involving the immune mechanism: Secondary | ICD-10-CM | POA: Insufficient documentation

## 2012-07-03 DIAGNOSIS — Z8719 Personal history of other diseases of the digestive system: Secondary | ICD-10-CM | POA: Insufficient documentation

## 2012-07-03 DIAGNOSIS — F329 Major depressive disorder, single episode, unspecified: Secondary | ICD-10-CM | POA: Insufficient documentation

## 2012-07-03 MED ORDER — TRAMADOL HCL 50 MG PO TABS: 50.0000 mg | ORAL_TABLET | Freq: Four times a day (QID) | ORAL | Status: DC | PRN

## 2012-07-03 MED ORDER — ASPIRIN EC 325 MG PO TBEC: 325.0000 mg | DELAYED_RELEASE_TABLET | Freq: Every day | ORAL | Status: DC

## 2012-07-03 NOTE — ED Notes (Addendum)
Per ems pt had trip and fall around 0430 this am. C/o right lateral foot pain, 10/10 upon movement. No pain at rest. Alert and oriented x4. Not on blood thinners. Distal pulses intact. Took 3 5-325 hydrocodone at 0430. Prefers blood pressure in left arm.

## 2012-07-03 NOTE — ED Notes (Signed)
Patient transported to X-ray 

## 2012-07-03 NOTE — ED Provider Notes (Signed)
History     CSN: [id number]  Arrival date & time 07/03/12  1015   First MD Initiated Contact with Patient 07/03/12 1020      Chief Complaint  Patient presents with  . Foot Injury    (Consider location/radiation/quality/duration/timing/severity/associated sxs/prior treatment) Patient is a 79 y.o. female presenting with foot injury. The history is provided by the patient.  Foot Injury Location:  Foot Time since incident:  6 hours Injury: yes   Associated symptoms: no back pain and no neck pain    patient injured her right foot when she got up at 431 and hurt her foot. She's had pain in her right lateral foot since. She has been walking on it.  Past Medical History  Diagnosis Date  . Asthma   . Depression   . Hypertension   . Arthritis   . Diverticulosis of colon   . History of colon polyps   . Insomnia   . Breast cancer 2006    right w/lumpectomy  . Endometrial hyperplasia 01/1996    fibroids, adenomyosis  . Spinal stenosis 2001  . Osteopenia 12/08    hip  . Vitamin D deficiency   . Diabetes 2007    Past Surgical History  Procedure Laterality Date  . Cervical fusion    . Cervical laminectomy  1975    x4  . Lumbar laminectomy  10/06    with spinal stenosis  . Cholecystectomy  11/00    lap chole  . Total hip arthroplasty Right 8/00  . Breast lumpectomy Right 03/2003  . Manipulation of hip for dislocation    . Tonsillectomy and adenoidectomy    . Carpal tunnel release    . Tubal ligation Bilateral 1968  . Rotator cuff repair Right 11/96  . Hysteroscopy  10/97    D&C, postmenopausal bleeding  . Total abdominal hysterectomy  12/97    endo hyperplasia, fibroids, adenomyosis  . Bilateral salpingoophorectomy  12/97  . Total knee arthroplasty Left 06/2003  . Total knee arthroplasty Right 10/06    Family History  Problem Relation Age of Onset  . Diabetes Father   . Colon cancer Father 40  . Heart failure Father   . Diabetes Brother   . Diabetes Brother   .  Arthritis Mother   . Thyroid disease Mother   . Osteoporosis Mother   . Osteoporosis Maternal Aunt   . Osteoporosis Maternal Grandmother     History  Substance Use Topics  . Smoking status: Former Smoker    Quit date: 04/24/1990  . Smokeless tobacco: Never Used  . Alcohol Use: 2.4 oz/week    4 Glasses of wine per week     Comment: occ    OB History   Grav Para Term Preterm Abortions TAB SAB Ect Mult Living   3 3 3              Review of Systems  HENT: Negative for neck pain.   Cardiovascular: Negative for chest pain.  Gastrointestinal: Negative for abdominal pain.  Musculoskeletal: Positive for gait problem. Negative for myalgias, back pain and joint swelling.  Skin: Negative for color change and wound.  Neurological: Negative for weakness.  Hematological: Does not bruise/bleed easily.    Allergies  Oxycodone hcl; Morphine sulfate; and Sulfamethoxazole  Home Medications   Current Outpatient Rx  Name  Route  Sig  Dispense  Refill  . albuterol (PROAIR HFA) 108 (90 BASE) MCG/ACT inhaler   Inhalation   Inhale 2 puffs into the lungs  every 6 (six) hours as needed for shortness of breath.          Marland Kitchen buPROPion (WELLBUTRIN XL) 300 MG 24 hr tablet   Oral   Take 300 mg by mouth daily.         . Cholecalciferol (VITAMIN D3) 2000 UNITS TABS   Oral   Take 2,500 Units by mouth.           . clonazePAM (KLONOPIN) 1 MG tablet   Oral   Take 1 tablet (1 mg total) by mouth daily.   30 tablet   5   . diazepam (VALIUM) 10 MG tablet   Oral   Take 1 tablet (10 mg total) by mouth 2 (two) times daily as needed.   60 tablet   5   . escitalopram (LEXAPRO) 10 MG tablet   Oral   Take 10 mg by mouth daily.          Marland Kitchen estradiol (ESTRACE) 0.1 MG/GM vaginal cream   Vaginal   Place 2 g vaginally daily as needed.         . Gabapentin, PHN, (GRALISE) 300 MG TABS   Oral   Take 1 capsule by mouth daily.         Marland Kitchen HYDROcodone-acetaminophen (VICODIN) 5-500 MG per tablet    Oral   Take 1 tablet by mouth every 6 (six) hours as needed for pain.          Marland Kitchen l-methylfolate-B6-B12 (METANX) 3-35-2 MG TABS   Oral   Take 1 tablet by mouth daily.         . montelukast (SINGULAIR) 10 MG tablet   Oral   Take 10 mg by mouth at bedtime.           Marland Kitchen olmesartan-hydrochlorothiazide (BENICAR HCT) 20-12.5 MG per tablet   Oral   Take 1 tablet by mouth daily.           . Red Yeast Rice 600 MG CAPS   Oral   Take 1 capsule by mouth daily.         . temazepam (RESTORIL) 30 MG capsule   Oral   Take 1 capsule (30 mg total) by mouth at bedtime as needed.   30 capsule   5   . aspirin EC 325 MG tablet   Oral   Take 1 tablet (325 mg total) by mouth daily.   30 tablet   0   . traMADol (ULTRAM) 50 MG tablet   Oral   Take 1 tablet (50 mg total) by mouth every 6 (six) hours as needed for pain.   15 tablet   0     BP 145/75  Pulse 66  Temp(Src) 98 F (36.7 C) (Oral)  Resp 16  SpO2 100%  Physical Exam  Constitutional: She appears well-developed and well-nourished.  HENT:  Head: Normocephalic.  Cardiovascular: Normal rate and regular rhythm.   Pulmonary/Chest: Effort normal.  Musculoskeletal: She exhibits tenderness.  Skin: Skin is warm.    ED Course  Procedures (including critical care time)  Labs Reviewed - No data to display Dg Foot Complete Right  07/03/2012   *RADIOLOGY REPORT*  Clinical Data: Trauma and pain.  RIGHT FOOT COMPLETE - 3+ VIEW  Comparison: None.  Findings: An oblique fracture of the distal shaft of the fifth metatarsal.  Moderate osteopenia.  Prior surgical changes in the third metatarsal head/neck region.  The fifth metatarsal fracture is displaced approximately one cortex medially and dorsally.  No intra-articular  extension.  IMPRESSION: Fifth metatarsal shaft fracture.   Original Report Authenticated By: Jeronimo Greaves, M.D.     1. Metatarsal fracture, right, closed, initial encounter       MDM  Patient with foot fracture.  Discussed with Dr. Darrelyn Hillock. Will see in the office tomorrow. Recommended Jones wrap and postop shoe.        Juliet Rude. Rubin Payor, MD 07/03/12 1226

## 2013-01-03 ENCOUNTER — Telehealth: Payer: Self-pay | Admitting: *Deleted

## 2013-01-03 NOTE — Telephone Encounter (Signed)
sw the pt informed the pt that on 05/03/13 [facility] will be in [facility] during the AM. gv appt for 3.18.15 @ 1PM. Pt is aware that i will mail a letter/avs...td

## 2013-03-31 ENCOUNTER — Encounter: Payer: Self-pay | Admitting: Oncology

## 2013-04-26 ENCOUNTER — Other Ambulatory Visit (HOSPITAL_BASED_OUTPATIENT_CLINIC_OR_DEPARTMENT_OTHER): Payer: Medicare Other

## 2013-04-26 DIAGNOSIS — Z853 Personal history of malignant neoplasm of breast: Secondary | ICD-10-CM

## 2013-04-26 LAB — COMPREHENSIVE METABOLIC PANEL (CC13)
ALBUMIN: 3.7 g/dL (ref 3.5–5.0)
ALK PHOS: 101 U/L (ref 40–150)
ALT: 22 U/L (ref 0–55)
ANION GAP: 8 meq/L (ref 3–11)
AST: 20 U/L (ref 5–34)
BILIRUBIN TOTAL: 0.44 mg/dL (ref 0.20–1.20)
BUN: 8.8 mg/dL (ref 7.0–26.0)
CO2: 30 meq/L — AB (ref 22–29)
Calcium: 9.9 mg/dL (ref 8.4–10.4)
Chloride: 103 mEq/L (ref 98–109)
Creatinine: 0.7 mg/dL (ref 0.6–1.1)
GLUCOSE: 110 mg/dL (ref 70–140)
POTASSIUM: 4.3 meq/L (ref 3.5–5.1)
SODIUM: 141 meq/L (ref 136–145)
TOTAL PROTEIN: 6.9 g/dL (ref 6.4–8.3)

## 2013-04-26 LAB — CBC WITH DIFFERENTIAL/PLATELET
BASO%: 0.5 % (ref 0.0–2.0)
Basophils Absolute: 0 10*3/uL (ref 0.0–0.1)
EOS ABS: 0.2 10*3/uL (ref 0.0–0.5)
EOS%: 4.9 % (ref 0.0–7.0)
HCT: 40.2 % (ref 34.8–46.6)
HGB: 13.1 g/dL (ref 11.6–15.9)
LYMPH%: 18.2 % (ref 14.0–49.7)
MCH: 31.3 pg (ref 25.1–34.0)
MCHC: 32.6 g/dL (ref 31.5–36.0)
MCV: 95.9 fL (ref 79.5–101.0)
MONO#: 0.5 10*3/uL (ref 0.1–0.9)
MONO%: 12.4 % (ref 0.0–14.0)
NEUT%: 64 % (ref 38.4–76.8)
NEUTROS ABS: 2.6 10*3/uL (ref 1.5–6.5)
Platelets: 214 10*3/uL (ref 145–400)
RBC: 4.19 10*6/uL (ref 3.70–5.45)
RDW: 14.7 % — AB (ref 11.2–14.5)
WBC: 4.1 10*3/uL (ref 3.9–10.3)
lymph#: 0.8 10*3/uL — ABNORMAL LOW (ref 0.9–3.3)

## 2013-05-01 ENCOUNTER — Telehealth: Payer: Self-pay | Admitting: *Deleted

## 2013-05-01 NOTE — Telephone Encounter (Signed)
Pt called for an appt. gv appt for 06/19/13 @ 2:30pm....td

## 2013-05-01 NOTE — Telephone Encounter (Signed)
Pt called to cancel her appts for 05/03/13 due to a family emergency...td

## 2013-05-03 ENCOUNTER — Ambulatory Visit: Payer: Medicare Other

## 2013-05-03 ENCOUNTER — Ambulatory Visit: Payer: Medicare Other | Admitting: Oncology

## 2013-06-19 ENCOUNTER — Ambulatory Visit (HOSPITAL_BASED_OUTPATIENT_CLINIC_OR_DEPARTMENT_OTHER): Payer: Medicare Other | Admitting: Oncology

## 2013-06-19 VITALS — BP 118/78 | HR 101 | Temp 98.8°F | Resp 20 | Ht 65.5 in | Wt 181.4 lb

## 2013-06-19 DIAGNOSIS — Z853 Personal history of malignant neoplasm of breast: Secondary | ICD-10-CM

## 2013-06-19 NOTE — Progress Notes (Signed)
ID: [name]   DOB: 08/07/1930  MR#: [id number]  PYP#:[id number]  PCP: Sheela Stack, MD GYN: SU:  OTHER MD:  BREAST CANCER HISTORY: [name] had a routine mammogram on 03-30-07 at [facility] which showed a possible mass at approximately 9:00 in the right breast.  On 04-05-03 she had spot compression views confirming a 1.5 cm. irregular mass at 9:00 and by ultrasound there really was no abnormality.  The mass was biopsied the same day and the final pathology ([id number]) showed an invasive mammary carcinoma that was estrogen and progesterone receptor positive, HER-2/neu negative.   On 04-13-03 [name] had bilateral breast MRI's which on the right showed only that single enhancing mass.  The left was fine.  With this information, she was referred to Dr. Osborn Coho and, after appropriate discussion, she proceeded to right lumpectomy and sentinel lymph node biopsy on 04-26-03.  The final pathology there showed a 1.4 cm. infiltrating ductal carcinoma with some lobular features, no evidence of lymphovascular invasion, negative margins, Grade 2 with zero of two lymph nodes involved. Her subsequent treatment is as detailed below  INTERVAL HISTORY: [name] returns today for followup of her breast cancer. At this point she is pretty much fully retired. 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 She and her husband are living in [place]. There pluses and minuses there.   REVIEW OF SYSTEMS: She "feels awful" most of the time. It isn't a little pain here in there its a little pain pretty much every where. She has chills as well. She does not have significant issues with hot flashes or vaginal dryness. She does have quite a bit of back pain and pain in other joints and she knows she has "arthritis all over".she denies to walk about 3 or 4 days a week and she uses the exercise facilities where she is living. Her husband [name] has had some medical problems is  currently receiving occupational therapy and she herself fell twice, in December of 14 in Januaryof 15. She is now using a cane wherever she goes and is exceedingly careful regarding falls. A detailed review of systems was otherwise stable  PAST MEDICAL HISTORY: Past Medical History  Diagnosis Date  . Asthma   . Depression   . Hypertension   . Arthritis   . Diverticulosis of colon   . History of colon polyps   . Insomnia   . Breast cancer 2006    right w/lumpectomy  . Endometrial hyperplasia 01/1996    fibroids, adenomyosis  . Spinal stenosis 2001  . Osteopenia 12/08    hip  . Vitamin D deficiency   . Diabetes 2007  Significant for remote tobacco abuse, the patient quitting more than 15 years ago.  She has mild COPD/asthma and some osteoarthritis. She has had some problems following a cervical laminectomy which was contaminated by staph.  She is status post right total hip replacement, status post total abdominal hysterectomy with bilateral salpingo-oophorectomy in 1998, status post cholecystectomy and status post tonsillectomy and adenoidectomy.  PAST SURGICAL HISTORY: Past Surgical History  Procedure Laterality Date  . Cervical fusion    . Cervical laminectomy  1975    x4  . Lumbar laminectomy  10/06    with spinal stenosis  . Cholecystectomy  11/00    lap chole  . Total hip arthroplasty Right 8/00  . Breast lumpectomy Right 03/2003  . Manipulation of hip for dislocation    . Tonsillectomy and adenoidectomy    .  Carpal tunnel release    . Tubal ligation Bilateral 1968  . Rotator cuff repair Right 11/96  . Hysteroscopy  10/97    D&C, postmenopausal bleeding  . Total abdominal hysterectomy  12/97    endo hyperplasia, fibroids, adenomyosis  . Bilateral salpingoophorectomy  12/97  . Total knee arthroplasty Left 06/2003  . Total knee arthroplasty Right 10/06    FAMILY HISTORY Family History  Problem Relation Age of Onset  . Diabetes Father   . Colon cancer Father 72   . Heart failure Father   . Diabetes Brother   . Diabetes Brother   . Arthritis Mother   . Thyroid disease Mother   . Osteoporosis Mother   . Osteoporosis Maternal Aunt   . Osteoporosis Maternal Grandmother   The patient's father died at the age of 1.  He had colon cancer but did not die from that.  The patient's mother also was 64 when she died.  The patient has two brothers.  There is no history of breast cancer in the family.  GYNECOLOGIC HISTORY: She had three live births, change of life occurred around 47 and she took hormone replacement until this diagnosis was made.   SOCIAL HISTORY: She is a Investment banker, operational.  Her husband, [name], is a retired Museum/gallery exhibitions officer.  The patient's three children are [name] who is an esthetician (does depilation and similar procedures), a son [name] who lives in [place] with two children, and a daughter [name] who lives in [place].  The patient has three grandchildren.    ADVANCED DIRECTIVES: In place  HEALTH MAINTENANCE: History  Substance Use Topics  . Smoking status: Former Smoker    Quit date: 04/24/1990  . Smokeless tobacco: Never Used  . Alcohol Use: 2.4 oz/week    4 Glasses of wine per week     Comment: occ     Colonoscopy:  PAP:  Bone density:  Lipid panel:  Allergies  Allergen Reactions  . Oxycodone Hcl [Oxycodone Hcl] Rash  . Morphine Sulfate     REACTION: very anxious  . Sulfamethoxazole     REACTION: unspecified    Current Outpatient Prescriptions  Medication Sig Dispense Refill  . albuterol (PROAIR HFA) 108 (90 BASE) MCG/ACT inhaler Inhale 2 puffs into the lungs every 6 (six) hours as needed for shortness of breath.       Marland Kitchen aspirin EC 325 MG tablet Take 1 tablet (325 mg total) by mouth daily.  30 tablet  0  . buPROPion (WELLBUTRIN XL) 300 MG 24 hr tablet Take 300 mg by mouth daily.      . Cholecalciferol (VITAMIN D3) 2000 UNITS TABS Take 2,500 Units by mouth.        . clonazePAM (KLONOPIN) 1 MG  tablet Take 1 tablet (1 mg total) by mouth daily.  30 tablet  5  . diazepam (VALIUM) 10 MG tablet Take 1 tablet (10 mg total) by mouth 2 (two) times daily as needed.  60 tablet  5  . escitalopram (LEXAPRO) 10 MG tablet Take 10 mg by mouth daily.       Marland Kitchen estradiol (ESTRACE) 0.1 MG/GM vaginal cream Place 2 g vaginally daily as needed.      . Gabapentin, PHN, (GRALISE) 300 MG TABS Take 1 capsule by mouth daily.      Marland Kitchen HYDROcodone-acetaminophen (VICODIN) 5-500 MG per tablet Take 1 tablet by mouth every 6 (six) hours as needed for pain.       Marland Kitchen l-methylfolate-B6-B12 (METANX) 3-35-2  MG TABS Take 1 tablet by mouth daily.      . montelukast (SINGULAIR) 10 MG tablet Take 10 mg by mouth at bedtime.        Marland Kitchen olmesartan-hydrochlorothiazide (BENICAR HCT) 20-12.5 MG per tablet Take 1 tablet by mouth daily.        . Red Yeast Rice 600 MG CAPS Take 1 capsule by mouth daily.      . temazepam (RESTORIL) 30 MG capsule Take 1 capsule (30 mg total) by mouth at bedtime as needed.  30 capsule  5  . traMADol (ULTRAM) 50 MG tablet Take 1 tablet (50 mg total) by mouth every 6 (six) hours as needed for pain.  15 tablet  0   No current facility-administered medications for this visit.    OBJECTIVE: Elderly white woman who appears stated age 80 Vitals:   06/19/13 1432  BP: 118/78  Pulse: 101  Temp: 98.8 F (37.1 C)  Resp: 20     Body mass index is 29.72 kg/(m^2).    ECOG FS: 1  Sclerae unicteric, extraocular movements intact Oropharynx clear, teeth in good repair No cervical or supraclavicular adenopathy Lungs no rales or rhonchi Heart regular rate and rhythm Abd soft, nontender, positive bowel sounds MSK kyphosis and scoliosis but no focal spinal tenderness,  Neuro: nonfocal, well oriented, frustrated affect Breasts: Right breast status post lumpectomyand MammoSite radiotherapy. No evidence of local recurrence. The right axilla is benign. The left breast is unremarkable  LAB RESULTS: Lab Results   Component Value Date   WBC 4.1 04/26/2013   NEUTROABS 2.6 04/26/2013   HGB 13.1 04/26/2013   HCT 40.2 04/26/2013   MCV 95.9 04/26/2013   PLT 214 04/26/2013      Chemistry      Component Value Date/Time   NA 141 04/26/2013 1129   NA 136 04/29/2011 1320   K 4.3 04/26/2013 1129   K 4.2 04/29/2011 1320   CL 97* 04/27/2012 1442   CL 102 04/29/2011 1320   CO2 30* 04/26/2013 1129   CO2 26 04/29/2011 1320   BUN 8.8 04/26/2013 1129   BUN 16 04/29/2011 1320   CREATININE 0.7 04/26/2013 1129   CREATININE 0.73 04/29/2011 1320      Component Value Date/Time   CALCIUM 9.9 04/26/2013 1129   CALCIUM 9.3 04/29/2011 1320   ALKPHOS 101 04/26/2013 1129   ALKPHOS 82 04/29/2011 1320   AST 20 04/26/2013 1129   AST 19 04/29/2011 1320   ALT 22 04/26/2013 1129   ALT 18 04/29/2011 1320   BILITOT 0.44 04/26/2013 1129   BILITOT 0.6 04/29/2011 1320       Lab Results  Component Value Date   LABCA2 13 07/24/2010    No components found with this basename: [id number]    No results found for this basename: INR,  in the last 168 hours  Urinalysis    Component Value Date/Time   COLORURINE YELLOW 01/04/2007 0900   APPEARANCEUR CLEAR 01/04/2007 0900   LABSPEC 1.029 01/04/2007 0900   PHURINE 6.0 01/04/2007 0900   GLUCOSEU NEGATIVE 01/04/2007 0900   HGBUR NEGATIVE 01/04/2007 0900   BILIRUBINUR n 09/25/2010 1525   BILIRUBINUR NEGATIVE 01/04/2007 0900   KETONESUR TRACE* 01/04/2007 0900   PROTEINUR n 09/25/2010 1525   PROTEINUR NEGATIVE 01/04/2007 0900   UROBILINOGEN 0.2 09/25/2010 1525   UROBILINOGEN 0.2 01/04/2007 0900   NITRITE n 09/25/2010 1525   NITRITE NEGATIVE 01/04/2007 0900   LEUKOCYTESUR 1+ 09/25/2010 1525    STUDIES: Right breast mammogram  and ultrasound 02/22/2013 at [facility] unremarkable. DEXA scan 02/22/2013 showed osteopenia, stable as compared to 2 years prior  ASSESSMENT: 80 y.o. [place] woman status post right lumpectomy and sentinel lymph node dissection March 2005 for a T1c N0, stage 1A invasive ductal  carcinoma, grade 2,  estrogen and progesterone receptor positive, HER-2 negative, status post MammoSite radiotherapy followed by aromatase inhibitors (initially exemestane, later letrozole) between April of 2005 and June of 2011.  PLAN: Rashema is doing fine from a breast cancer point of view, with no evidence of disease recurrence now 10 years out from her definitive surgery. She knows I would be comfortable releasing her to her primary care physician, Dr. Forde Dandy, but she prefers to be seen here in a once a year basis "as her security blanket", and we are fine without.  Accordingly she will see me again in one year. She will continue to have yearly mammography, which she usually obtains in January at [facility]. She knows to call for any problems that may develop before her next visit here. Virgie Dad Magrinat    06/19/2013

## 2013-06-20 NOTE — Addendum Note (Signed)
Addended by: Laureen Abrahams on: 06/20/2013 06:34 PM   Modules accepted: Orders

## 2013-06-21 ENCOUNTER — Telehealth: Payer: Self-pay | Admitting: Oncology

## 2013-06-21 NOTE — Telephone Encounter (Signed)
per pof to sch pt appt/no anser/will mail copy of appt

## 2013-11-27 ENCOUNTER — Encounter: Payer: Self-pay | Admitting: Internal Medicine

## 2013-12-18 ENCOUNTER — Encounter (HOSPITAL_COMMUNITY): Payer: Self-pay | Admitting: *Deleted

## 2014-06-12 ENCOUNTER — Telehealth: Payer: Self-pay | Admitting: Oncology

## 2014-06-12 NOTE — Telephone Encounter (Signed)
Patient confirmed appointment reschedule due to transportation. Confirmed 06/01, Mailed calendar.

## 2014-06-14 ENCOUNTER — Other Ambulatory Visit: Payer: Medicare Other

## 2014-06-21 ENCOUNTER — Ambulatory Visit: Payer: Medicare Other | Admitting: Oncology

## 2014-06-21 ENCOUNTER — Other Ambulatory Visit: Payer: Medicare Other

## 2014-07-18 ENCOUNTER — Other Ambulatory Visit (HOSPITAL_BASED_OUTPATIENT_CLINIC_OR_DEPARTMENT_OTHER): Payer: Medicare Other

## 2014-07-18 ENCOUNTER — Telehealth: Payer: Self-pay | Admitting: Oncology

## 2014-07-18 ENCOUNTER — Ambulatory Visit (HOSPITAL_BASED_OUTPATIENT_CLINIC_OR_DEPARTMENT_OTHER): Payer: Medicare Other | Admitting: Oncology

## 2014-07-18 VITALS — BP 141/79 | HR 91 | Temp 97.5°F | Resp 20 | Ht 65.5 in | Wt 182.2 lb

## 2014-07-18 DIAGNOSIS — Z853 Personal history of malignant neoplasm of breast: Secondary | ICD-10-CM

## 2014-07-18 DIAGNOSIS — C50911 Malignant neoplasm of unspecified site of right female breast: Secondary | ICD-10-CM | POA: Insufficient documentation

## 2014-07-18 DIAGNOSIS — J45901 Unspecified asthma with (acute) exacerbation: Secondary | ICD-10-CM

## 2014-07-18 DIAGNOSIS — J4521 Mild intermittent asthma with (acute) exacerbation: Secondary | ICD-10-CM

## 2014-07-18 DIAGNOSIS — R05 Cough: Secondary | ICD-10-CM | POA: Diagnosis not present

## 2014-07-18 LAB — CBC WITH DIFFERENTIAL/PLATELET
BASO%: 0.6 % (ref 0.0–2.0)
Basophils Absolute: 0 10*3/uL (ref 0.0–0.1)
EOS%: 1.9 % (ref 0.0–7.0)
Eosinophils Absolute: 0.1 10*3/uL (ref 0.0–0.5)
HCT: 42.2 % (ref 34.8–46.6)
HGB: 14.1 g/dL (ref 11.6–15.9)
LYMPH%: 17.9 % (ref 14.0–49.7)
MCH: 32.8 pg (ref 25.1–34.0)
MCHC: 33.4 g/dL (ref 31.5–36.0)
MCV: 98.1 fL (ref 79.5–101.0)
MONO#: 0.4 10*3/uL (ref 0.1–0.9)
MONO%: 9.1 % (ref 0.0–14.0)
NEUT%: 70.5 % (ref 38.4–76.8)
NEUTROS ABS: 3.3 10*3/uL (ref 1.5–6.5)
PLATELETS: 174 10*3/uL (ref 145–400)
RBC: 4.3 10*6/uL (ref 3.70–5.45)
RDW: 14.3 % (ref 11.2–14.5)
WBC: 4.6 10*3/uL (ref 3.9–10.3)
lymph#: 0.8 10*3/uL — ABNORMAL LOW (ref 0.9–3.3)

## 2014-07-18 LAB — COMPREHENSIVE METABOLIC PANEL (CC13)
ALT: 19 U/L (ref 0–55)
AST: 21 U/L (ref 5–34)
Albumin: 4 g/dL (ref 3.5–5.0)
Alkaline Phosphatase: 90 U/L (ref 40–150)
Anion Gap: 8 mEq/L (ref 3–11)
BILIRUBIN TOTAL: 0.57 mg/dL (ref 0.20–1.20)
BUN: 11.6 mg/dL (ref 7.0–26.0)
CALCIUM: 9.7 mg/dL (ref 8.4–10.4)
CO2: 30 mEq/L — ABNORMAL HIGH (ref 22–29)
Chloride: 103 mEq/L (ref 98–109)
Creatinine: 0.7 mg/dL (ref 0.6–1.1)
EGFR: 75 mL/min/{1.73_m2} — ABNORMAL LOW (ref >90)
Glucose: 81 mg/dl (ref 70–140)
POTASSIUM: 5 meq/L (ref 3.5–5.1)
Sodium: 141 mEq/L (ref 136–145)
Total Protein: 6.9 g/dL (ref 6.4–8.3)

## 2014-07-18 MED ORDER — PREDNISONE 10 MG PO TABS: 10.0000 mg | ORAL_TABLET | Freq: Every day | ORAL | Status: DC

## 2014-07-18 MED ORDER — PREDNISONE 10 MG (21) PO TBPK: 10.0000 mg | ORAL_TABLET | Freq: Every day | ORAL | Status: DC

## 2014-07-18 NOTE — Progress Notes (Signed)
ID: [name]   DOB: [birth year]  MR#: [id number]  YNW#:[id number]  PCP: Sheela Stack, MD GYN: SU:  OTHER MD:  BREAST CANCER HISTORY: From the original intake note:  [name] had a routine mammogram on 03-30-07 at [facility] which showed a possible mass at approximately 9:00 in the right breast.  On 04-05-03 she had spot compression views confirming a 1.5 cm. irregular mass at 9:00 and by ultrasound there really was no abnormality.  The mass was biopsied the same day and the final pathology ([id number]) showed an invasive mammary carcinoma that was estrogen and progesterone receptor positive, HER-2/neu negative.   On 04-13-03 [name] had bilateral breast MRI's which on the right showed only that single enhancing mass.  The left was fine.  With this information, she was referred to Dr. Osborn Coho and, after appropriate discussion, she proceeded to right lumpectomy and sentinel lymph node biopsy on 04-26-03.  The final pathology there showed a 1.4 cm. infiltrating ductal carcinoma with some lobular features, no evidence of lymphovascular invasion, negative margins, Grade 2 with zero of two lymph nodes involved.   Her subsequent treatment is as detailed below  INTERVAL HISTORY: [name] returns today for followup of her breast cancer. The interval history is unremarkable from a breast cancer point of view, and she had her most recent mammography generated 15 2016, which was unremarkable. [name] is now living at [facility] and enjoying it a great deal. 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 She has seen Dr. Forde Dandy oh regarding this on several occasions and she tells me she has a ready had 2 rounds of antibiotics as well as taken Nexium for 14 days. Neither made any difference. She says she had a CT scan of the chest  which showed no evidence of cancer. I do not have any records from Dr. Forde Dandy so cannot review those reports. She does have a history of asthma and uses mometasone inhaler for this, with albuterol as a rescue inhaler. She also takes montelukast tablets. None of this has seemed to make any difference. Aside from that she continues to have pain in her back. This is usually worse in the morning and gets better during the day. She has some sinus problems. She has some hoarseness. She has stress urinary incontinence. She can't sleep very well because she tells me her husband pulls off his CPAP mask and "the noise is unbearableeven with earplugs in place". She admits to anxiety but not depression. A detailed review of systems today was otherwise stable  PAST MEDICAL HISTORY: Past Medical History  Diagnosis Date  . Asthma   . Depression   . Hypertension   . Arthritis   . Diverticulosis of colon   . History of colon polyps   . Insomnia   . Breast cancer 2006    right w/lumpectomy  . Endometrial hyperplasia 01/1996    fibroids, adenomyosis  . Spinal stenosis 2001  . Osteopenia 12/08    hip  . Vitamin D deficiency   . Diabetes 2007  Significant for remote tobacco abuse, the patient quitting more than 15 years ago.  She has mild COPD/asthma and some osteoarthritis. She has had some problems following a cervical laminectomy which was contaminated by staph.  She is status post right total hip replacement, status post total  abdominal hysterectomy with bilateral salpingo-oophorectomy in 1998, status post cholecystectomy and status post tonsillectomy and adenoidectomy.  PAST SURGICAL HISTORY: Past Surgical History  Procedure Laterality Date  . Cervical fusion    . Cervical laminectomy  1975    x4  . Lumbar laminectomy  10/06    with spinal stenosis  . Cholecystectomy  11/00    lap chole  . Total hip arthroplasty Right 8/00  . Breast lumpectomy Right 03/2003  . Manipulation of hip for dislocation     . Tonsillectomy and adenoidectomy    . Carpal tunnel release    . Tubal ligation Bilateral 1968  . Rotator cuff repair Right 11/96  . Hysteroscopy  10/97    D&C, postmenopausal bleeding  . Total abdominal hysterectomy  12/97    endo hyperplasia, fibroids, adenomyosis  . Bilateral salpingoophorectomy  12/97  . Total knee arthroplasty Left 06/2003  . Total knee arthroplasty Right 10/06    FAMILY HISTORY Family History  Problem Relation Age of Onset  . Diabetes Father   . Colon cancer Father 73  . Heart failure Father   . Diabetes Brother   . Diabetes Brother   . Arthritis Mother   . Thyroid disease Mother   . Osteoporosis Mother   . Osteoporosis Maternal Aunt   . Osteoporosis Maternal Grandmother   The patient's father died at the age of 19.  He had colon cancer but did not die from that.  The patient's mother also was 45 when she died.  The patient has two brothers.  There is no history of breast cancer in the family.  GYNECOLOGIC HISTORY: She had three live births, change of life occurred around 17 and she took hormone replacement until this diagnosis was made.   SOCIAL HISTORY: She is a Investment banker, operational.  Her husband, Mikki Santee, is a retired Museum/gallery exhibitions officer.  The patient's three children are Si Raider who is an esthetician (does depilation and similar procedures), a son [name] who lives in [place] with two children, and a daughter Jana Half who lives in [place].  The patient has three grandchildren.    ADVANCED DIRECTIVES: In place  HEALTH MAINTENANCE: History  Substance Use Topics  . Smoking status: Former Smoker    Quit date: 04/24/1990  . Smokeless tobacco: Never Used  . Alcohol Use: 2.4 oz/week    4 Glasses of wine per week     Comment: occ     Colonoscopy:  PAP:  Bone density:  Lipid panel:  Allergies  Allergen Reactions  . Oxycodone Hcl [Oxycodone Hcl] Rash  . Morphine Sulfate     REACTION: very anxious  . Sulfamethoxazole     REACTION:  unspecified    Current Outpatient Prescriptions  Medication Sig Dispense Refill  . albuterol (PROAIR HFA) 108 (90 BASE) MCG/ACT inhaler Inhale 2 puffs into the lungs every 6 (six) hours as needed for shortness of breath.     Marland Kitchen aspirin EC 325 MG tablet Take 1 tablet (325 mg total) by mouth daily. 30 tablet 0  . buPROPion (WELLBUTRIN XL) 300 MG 24 hr tablet Take 300 mg by mouth daily.    . Cholecalciferol (VITAMIN D3) 2000 UNITS TABS Take 2,500 Units by mouth.      . clonazePAM (KLONOPIN) 1 MG tablet Take 1 tablet (1 mg total) by mouth daily. 30 tablet 5  . escitalopram (LEXAPRO) 10 MG tablet Take 10 mg by mouth daily.     Marland Kitchen estradiol (ESTRACE) 0.1 MG/GM vaginal cream Place 2  g vaginally daily as needed.    . Gabapentin, PHN, (GRALISE) 300 MG TABS Take 1 tablet by mouth daily.    Marland Kitchen HYDROcodone-acetaminophen (VICODIN) 5-500 MG per tablet Take 1 tablet by mouth every 6 (six) hours as needed for pain.     Marland Kitchen l-methylfolate-B6-B12 (METANX) 3-35-2 MG TABS Take 1 tablet by mouth daily.    . montelukast (SINGULAIR) 10 MG tablet Take 10 mg by mouth at bedtime.      Marland Kitchen olmesartan-hydrochlorothiazide (BENICAR HCT) 20-12.5 MG per tablet Take 1 tablet by mouth daily.      . temazepam (RESTORIL) 30 MG capsule Take 1 capsule (30 mg total) by mouth at bedtime as needed. 30 capsule 5  . traMADol (ULTRAM) 50 MG tablet Take 1 tablet (50 mg total) by mouth every 6 (six) hours as needed for pain. 15 tablet 0   No current facility-administered medications for this visit.    OBJECTIVE: Elderly white woman with a persistent cough during today's visit , productive of clear to yellow phlegm  Filed Vitals:   07/18/14 1244  BP: 141/79  Pulse: 91  Temp: 97.5 F (36.4 C)  Resp: 20     Body mass index is 29.85 kg/(m^2).    ECOG FS: 1  Sclerae unicteric, pupils round and equal Oropharynx clear and moist-- no thrush or other lesions No cervical or supraclavicular adenopathy, no submental adenopathy Lungs no rales  or rhonchi Heart regular rate and rhythm Abd soft, nontender, positive bowel sounds MSK no focal spinal tenderness, no upper extremity lymphedema Neuro: nonfocal, well oriented, appropriate affect Breasts: The right breast is status post lumpectomy and MammoSite radiation. There is no evidence of local recurrence. The right axilla is benign per the left breast is unremarkable.  LAB RESULTS: Lab Results  Component Value Date   WBC 4.6 07/18/2014   NEUTROABS 3.3 07/18/2014   HGB 14.1 07/18/2014   HCT 42.2 07/18/2014   MCV 98.1 07/18/2014   PLT 174 07/18/2014      Chemistry      Component Value Date/Time   NA 141 04/26/2013 1129   NA 136 04/29/2011 1320   K 4.3 04/26/2013 1129   K 4.2 04/29/2011 1320   CL 97* 04/27/2012 1442   CL 102 04/29/2011 1320   CO2 30* 04/26/2013 1129   CO2 26 04/29/2011 1320   BUN 8.8 04/26/2013 1129   BUN 16 04/29/2011 1320   CREATININE 0.7 04/26/2013 1129   CREATININE 0.73 04/29/2011 1320      Component Value Date/Time   CALCIUM 9.9 04/26/2013 1129   CALCIUM 9.3 04/29/2011 1320   ALKPHOS 101 04/26/2013 1129   ALKPHOS 82 04/29/2011 1320   AST 20 04/26/2013 1129   AST 19 04/29/2011 1320   ALT 22 04/26/2013 1129   ALT 18 04/29/2011 1320   BILITOT 0.44 04/26/2013 1129   BILITOT 0.6 04/29/2011 1320       Lab Results  Component Value Date   LABCA2 13 07/24/2010    No components found for: [id number]  No results for input(s): INR in the last 168 hours.  Urinalysis    Component Value Date/Time   COLORURINE YELLOW 01/04/2007 0900   APPEARANCEUR CLEAR 01/04/2007 0900   LABSPEC 1.029 01/04/2007 0900   PHURINE 6.0 01/04/2007 0900   GLUCOSEU NEGATIVE 01/04/2007 0900   HGBUR NEGATIVE 01/04/2007 0900   BILIRUBINUR n 09/25/2010 1525   BILIRUBINUR NEGATIVE 01/04/2007 0900   KETONESUR TRACE* 01/04/2007 0900   PROTEINUR n 09/25/2010 [place]  NEGATIVE 01/04/2007 0900   UROBILINOGEN 0.2 09/25/2010 1525   UROBILINOGEN 0.2 01/04/2007  0900   NITRITE n 09/25/2010 1525   NITRITE NEGATIVE 01/04/2007 0900   LEUKOCYTESUR 1+ 09/25/2010 1525    STUDIES:  patient states a CT of the chest was obtained through no variant which showed no evidence of cancer  ASSESSMENT: 81 y.o. [name] woman status post right lumpectomy and sentinel lymph node dissection March 2005 for a T1c N0, stage 1A invasive ductal carcinoma, grade 2,  estrogen and progesterone receptor positive, HER-2 negative  (a) status post MammoSite radiotherapy  (b) rceived adjuvant aromatase inhibitors (initially exemestane, later letrozole) between April of 2005 and June of 2011.  PLAN: From a breast cancer point of view [name] is doing well, with no evidence of disease recurrence. She knows I would be comfortable releasing her to her primary care physician, but she tells me she very much prefers to continue to see [place] on a once a year basis and I am glad to accommodate that.  What is bothering her the most is her productive cough. This has been present for several months. As noted above this  has already been treated with antibiotics 2 and she also had a course of Nexium with no relief. I suggested she return to her primary care physician and consider a course of steroids, since this is likely related to her asthma. Alternatively she could see a pulmonologist.  What Tristina wants After much discussion* prednisone for a few days and see that clears things. We discussed how this may affect her glucose control. She will be on prednisone 10 mg with breakfast for a week. She will let me know the results at the end of the week and whether she would like a referral to pulmonology if things don't clear up.  Otherwise I will see her again in one year. She knows to call for any problems that may develop before then.   MAGRINAT,GUSTAV C    07/18/2014

## 2014-07-18 NOTE — Telephone Encounter (Signed)
Gave avs & calendar for May 2017 °

## 2015-01-26 ENCOUNTER — Emergency Department (HOSPITAL_COMMUNITY): Payer: Medicare Other

## 2015-01-26 ENCOUNTER — Encounter (HOSPITAL_COMMUNITY): Payer: Self-pay | Admitting: *Deleted

## 2015-01-26 ENCOUNTER — Emergency Department (HOSPITAL_COMMUNITY)
Admission: EM | Admit: 2015-01-26 | Discharge: 2015-01-26 | Disposition: A | Discharge status: Nursing Home (Includes ICF) | Payer: Medicare Other

## 2015-01-26 DIAGNOSIS — E559 Vitamin D deficiency, unspecified: Secondary | ICD-10-CM | POA: Diagnosis not present

## 2015-01-26 DIAGNOSIS — E119 Type 2 diabetes mellitus without complications: Secondary | ICD-10-CM | POA: Insufficient documentation

## 2015-01-26 DIAGNOSIS — Z79899 Other long term (current) drug therapy: Secondary | ICD-10-CM | POA: Insufficient documentation

## 2015-01-26 DIAGNOSIS — W1839XA Other fall on same level, initial encounter: Secondary | ICD-10-CM | POA: Insufficient documentation

## 2015-01-26 DIAGNOSIS — Z86018 Personal history of other benign neoplasm: Secondary | ICD-10-CM | POA: Diagnosis not present

## 2015-01-26 DIAGNOSIS — F329 Major depressive disorder, single episode, unspecified: Secondary | ICD-10-CM | POA: Insufficient documentation

## 2015-01-26 DIAGNOSIS — M8580 Other specified disorders of bone density and structure, unspecified site: Secondary | ICD-10-CM | POA: Diagnosis not present

## 2015-01-26 DIAGNOSIS — M1990 Unspecified osteoarthritis, unspecified site: Secondary | ICD-10-CM | POA: Diagnosis not present

## 2015-01-26 DIAGNOSIS — W19XXXA Unspecified fall, initial encounter: Secondary | ICD-10-CM

## 2015-01-26 DIAGNOSIS — Y9389 Activity, other specified: Secondary | ICD-10-CM | POA: Insufficient documentation

## 2015-01-26 DIAGNOSIS — Z853 Personal history of malignant neoplasm of breast: Secondary | ICD-10-CM | POA: Diagnosis not present

## 2015-01-26 DIAGNOSIS — S79911A Unspecified injury of right hip, initial encounter: Secondary | ICD-10-CM | POA: Insufficient documentation

## 2015-01-26 DIAGNOSIS — Z86010 Personal history of colonic polyps: Secondary | ICD-10-CM | POA: Insufficient documentation

## 2015-01-26 DIAGNOSIS — Z87891 Personal history of nicotine dependence: Secondary | ICD-10-CM | POA: Diagnosis not present

## 2015-01-26 DIAGNOSIS — Z8719 Personal history of other diseases of the digestive system: Secondary | ICD-10-CM | POA: Diagnosis not present

## 2015-01-26 DIAGNOSIS — G4700 Insomnia, unspecified: Secondary | ICD-10-CM | POA: Insufficient documentation

## 2015-01-26 DIAGNOSIS — Z7951 Long term (current) use of inhaled steroids: Secondary | ICD-10-CM | POA: Diagnosis not present

## 2015-01-26 DIAGNOSIS — Y998 Other external cause status: Secondary | ICD-10-CM | POA: Insufficient documentation

## 2015-01-26 DIAGNOSIS — Y92009 Unspecified place in unspecified non-institutional (private) residence as the place of occurrence of the external cause: Secondary | ICD-10-CM | POA: Insufficient documentation

## 2015-01-26 DIAGNOSIS — R262 Difficulty in walking, not elsewhere classified: Secondary | ICD-10-CM

## 2015-01-26 LAB — BASIC METABOLIC PANEL
ANION GAP: 5 (ref 5–15)
BUN: 8 mg/dL (ref 6–20)
CALCIUM: 9.1 mg/dL (ref 8.9–10.3)
CO2: 30 mmol/L (ref 22–32)
Chloride: 105 mmol/L (ref 101–111)
Creatinine, Ser: 0.64 mg/dL (ref 0.44–1.00)
GFR calc Af Amer: >60 mL/min (ref >60)
Glucose, Bld: 86 mg/dL (ref 65–99)
POTASSIUM: 4.1 mmol/L (ref 3.5–5.1)
SODIUM: 140 mmol/L (ref 135–145)

## 2015-01-26 LAB — CBC WITH DIFFERENTIAL/PLATELET
BASOS ABS: 0 10*3/uL (ref 0.0–0.1)
BASOS PCT: 1 %
EOS PCT: 2 %
Eosinophils Absolute: 0.1 10*3/uL (ref 0.0–0.7)
HCT: 39.2 % (ref 36.0–46.0)
Hemoglobin: 13 g/dL (ref 12.0–15.0)
LYMPHS PCT: 17 %
Lymphs Abs: 0.8 10*3/uL (ref 0.7–4.0)
MCH: 32.4 pg (ref 26.0–34.0)
MCHC: 33.2 g/dL (ref 30.0–36.0)
MCV: 97.8 fL (ref 78.0–100.0)
Monocytes Absolute: 0.6 10*3/uL (ref 0.1–1.0)
Monocytes Relative: 13 %
NEUTROS ABS: 3 10*3/uL (ref 1.7–7.7)
Neutrophils Relative %: 67 %
PLATELETS: 183 10*3/uL (ref 150–400)
RBC: 4.01 MIL/uL (ref 3.87–5.11)
RDW: 13.4 % (ref 11.5–15.5)
WBC: 4.5 10*3/uL (ref 4.0–10.5)

## 2015-01-26 LAB — PROTIME-INR
INR: 1.08 (ref 0.00–1.49)
Prothrombin Time: 14.2 seconds (ref 11.6–15.2)

## 2015-01-26 MED ORDER — OLMESARTAN MEDOXOMIL-HCTZ 20-12.5 MG PO TABS: 1.0000 | ORAL_TABLET | Freq: Every day | ORAL | Status: DC

## 2015-01-26 MED ORDER — L-METHYLFOLATE-B6-B12 3-35-2 MG PO TABS
1.0000 | ORAL_TABLET | Freq: Two times a day (BID) | ORAL | Status: DC
  Administered 2015-01-26: 1 via ORAL
  Filled 2015-01-26 (×2): qty 1

## 2015-01-26 MED ORDER — BUPROPION HCL ER (XL) 300 MG PO TB24
300.0000 mg | ORAL_TABLET | Freq: Every day | ORAL | Status: DC
  Administered 2015-01-26: 300 mg via ORAL
  Filled 2015-01-26: qty 1

## 2015-01-26 MED ORDER — CLONAZEPAM 0.5 MG PO TABS
1.0000 mg | ORAL_TABLET | Freq: Every day | ORAL | Status: DC
  Administered 2015-01-26: 1 mg via ORAL
  Filled 2015-01-26: qty 2

## 2015-01-26 MED ORDER — TEMAZEPAM 15 MG PO CAPS: 30.0000 mg | ORAL_CAPSULE | Freq: Every evening | ORAL | Status: DC | PRN

## 2015-01-26 MED ORDER — HYDROCODONE-ACETAMINOPHEN 5-325 MG PO TABS
1.0000 | ORAL_TABLET | Freq: Four times a day (QID) | ORAL | Status: DC | PRN
Start: 2015-01-26 — End: 2015-01-26
  Administered 2015-01-26: 1 via ORAL
  Filled 2015-01-26: qty 1

## 2015-01-26 MED ORDER — HYDROCHLOROTHIAZIDE 12.5 MG PO CAPS
12.5000 mg | ORAL_CAPSULE | Freq: Every day | ORAL | Status: DC
  Administered 2015-01-26: 12.5 mg via ORAL
  Filled 2015-01-26: qty 1

## 2015-01-26 MED ORDER — SODIUM CHLORIDE 0.9 % IV SOLN
1000.0000 mL | INTRAVENOUS | Status: DC
  Administered 2015-01-26: 1000 mL via INTRAVENOUS

## 2015-01-26 MED ORDER — IRBESARTAN 150 MG PO TABS
150.0000 mg | ORAL_TABLET | Freq: Every day | ORAL | Status: DC
  Administered 2015-01-26: 150 mg via ORAL
  Filled 2015-01-26: qty 1

## 2015-01-26 MED ORDER — ESCITALOPRAM OXALATE 10 MG PO TABS
10.0000 mg | ORAL_TABLET | Freq: Every day | ORAL | Status: DC
  Administered 2015-01-26: 10 mg via ORAL
  Filled 2015-01-26: qty 1

## 2015-01-26 MED ORDER — FENTANYL CITRATE (PF) 100 MCG/2ML IJ SOLN
50.0000 ug | INTRAMUSCULAR | Status: DC | PRN
  Administered 2015-01-26: 50 ug via INTRAVENOUS
  Filled 2015-01-26: qty 2

## 2015-01-26 MED ORDER — FENTANYL CITRATE (PF) 100 MCG/2ML IJ SOLN
100.0000 ug | Freq: Once | INTRAMUSCULAR | Status: AC
  Administered 2015-01-26: 100 ug via INTRAVENOUS
  Filled 2015-01-26: qty 2

## 2015-01-26 MED ORDER — MONTELUKAST SODIUM 10 MG PO TABS
10.0000 mg | ORAL_TABLET | Freq: Every day | ORAL | Status: DC
  Filled 2015-01-26: qty 1

## 2015-01-26 MED ORDER — MOMETASONE FURO-FORMOTEROL FUM 100-5 MCG/ACT IN AERO
2.0000 | INHALATION_SPRAY | Freq: Two times a day (BID) | RESPIRATORY_TRACT | Status: DC
  Administered 2015-01-26: 2 via RESPIRATORY_TRACT
  Filled 2015-01-26: qty 8.8

## 2015-01-26 MED ORDER — ALBUTEROL SULFATE HFA 108 (90 BASE) MCG/ACT IN AERS: 2.0000 | INHALATION_SPRAY | Freq: Four times a day (QID) | RESPIRATORY_TRACT | Status: DC | PRN

## 2015-01-26 NOTE — ED Notes (Signed)
Per EMS pt coming from [facility] assisted [facility], with c/o fall last night (mechanical fall per EMS), this morning she woke up and is c/o right hip and thigh pain. She is requesting that we immediately call her orthopedic dr to let him know she is here.

## 2015-01-26 NOTE — Progress Notes (Signed)
4:40pm. Pt accepted to [facility]. Son Burna Forts has signed pt in. Pt can transport via [facility] now. Call report to [phone].  [place] Worker [facility] phone: [phone]

## 2015-01-26 NOTE — ED Notes (Signed)
Unable to collect labs PT taken to xray

## 2015-01-26 NOTE — ED Provider Notes (Signed)
Patient lost her balance and fell last night landing on her buttocks. She complains of right buttock pain and right groin pain and right thigh pain since the event. Pain is worse with weightbearing. Improved with remaining still and she has been able to and what with her walker since the event however with significant pain and she was treated with oxycodone with partial relief. On exam she is alert no distress Glasgow Coma Score 15 abdomen soft nontender. Pelvis stable. Tender on anterior posterior compression and tender at right buttock. Right lower extremity without deformity. There is no pain on internal or external rotation of thigh. DP pulse 2+. All other extremities without contusion abrasion or tenderness neurovascularly intact  Orlie Dakin, MD 01/26/15 [phone]

## 2015-01-26 NOTE — NC FL2 (Signed)
[place] LEVEL OF CARE SCREENING TOOL     IDENTIFICATION  Patient Name: [name] Birthdate: [birth year] Sex: female Admission Date (Current Location): 01/26/2015  [facility] and [place] Number: [id number] teacher and Address:  [facility],  [place], [place]      Provider Number: [id number]  Attending Physician Name and Address:  Orlie Dakin, MD  Relative Name and Phone Number:  Creta Bartch, husband, [phone]; Burna Forts, son, [phone]    Current Level of Care: Other (Comment) (indepedent living--[facility]) Recommended Level of Care: Nursing Facility Prior Approval Number:    Date Approved/Denied:   PASRR Number: [id number]  Discharge Plan: SNF    Current Diagnoses: Patient Active Problem List   Diagnosis Date Noted  . Breast cancer, right breast ([place]) 07/18/2014  . CONCUSSION WITH LOC OF UNSPECIFIED DURATION 07/19/2009  . OTH SYMPTOMS INVOLVING RESPIRATORY SYSTEM&CHEST 11/12/2008  . UNSPECIFIED DISORDER OF ANKLE AND FOOT JOINT 05/11/2008  . CERUMEN IMPACTION, BILATERAL 05/04/2008  . DYSMETABOLIC SYNDROME X [id number]  . UNS ADVRS EFF UNS RX MEDICINAL&BIOLOGICAL SBSTNC 08/12/2007  . Other and unspecified hyperlipidemia 04/01/2007  . INSOMNIA, PERSISTENT 09/09/2006  . DEPRESSION 09/09/2006  . HYPERTENSION 09/09/2006  . Asthma 09/09/2006  . DIVERTICULOSIS, COLON 09/09/2006  . OSTEOARTHRITIS 09/09/2006  . STENOSIS, LUMBAR SPINE 09/09/2006  . COLONIC POLYPS, HX OF 09/09/2006    Orientation RESPIRATION BLADDER Height & Weight    Self, Time, Situation, Place  Normal Continent   182 lbs.  BEHAVIORAL SYMPTOMS/MOOD NEUROLOGICAL BOWEL NUTRITION STATUS      Continent Diet (carb modified diet; fluid consistency thin)  AMBULATORY STATUS COMMUNICATION OF NEEDS Skin   Limited Assist Verbally Normal                       Personal Care Assistance Level of Assistance  Bathing, Feeding,  Dressing Bathing Assistance: Limited assistance Feeding assistance: Limited assistance Dressing Assistance: Limited assistance     Functional Limitations Info             SPECIAL CARE FACTORS FREQUENCY                       Contractures Contractures Info: Not present    Additional Factors Info  Allergies   Allergies Info: Oxycodone Hcl, Morphine Sulfate, Sulfamethoxazole           Current Medications (01/26/2015):  This is the current hospital active medication list Current Facility-Administered Medications  Medication Dose Route Frequency Provider Last Rate Last Dose  . 0.9 %  sodium chloride infusion  1,000 mL Intravenous Continuous Delos Haring, PA-C 75 mL/hr at 01/26/15 1011 1,000 mL at 01/26/15 1011  . albuterol (PROVENTIL HFA;VENTOLIN HFA) 108 (90 BASE) MCG/ACT inhaler 2 puff  2 puff Inhalation Q6H PRN Delos Haring, PA-C      . buPROPion (WELLBUTRIN XL) 24 hr tablet 300 mg  300 mg Oral Daily Tiffany Greene, PA-C      . clonazePAM (KLONOPIN) tablet 1 mg  1 mg Oral Daily Tiffany Greene, PA-C      . escitalopram (LEXAPRO) tablet 10 mg  10 mg Oral Daily Tiffany Greene, PA-C      . fentaNYL (SUBLIMAZE) injection 50 mcg  50 mcg Intravenous Q30 min PRN Delos Haring, PA-C   50 mcg at 01/26/15 1011  . HYDROcodone-acetaminophen (NORCO/VICODIN) 5-325 MG per tablet 1 tablet  1 tablet Oral Q6H PRN Delos Haring, PA-C      .  l-methylfolate-B6-B12 (METANX) 3-35-2 MG per tablet 1 tablet  1 tablet Oral BID Delos Haring, PA-C      . mometasone-formoterol (DULERA) 100-5 MCG/ACT inhaler 2 puff  2 puff Inhalation BID Delos Haring, PA-C      . montelukast (SINGULAIR) tablet 10 mg  10 mg Oral QHS Tiffany Greene, PA-C      . temazepam (RESTORIL) capsule 30 mg  30 mg Oral QHS PRN Delos Haring, PA-C       Current Outpatient Prescriptions  Medication Sig Dispense Refill  . albuterol (PROAIR HFA) 108 (90 BASE) MCG/ACT inhaler Inhale 2 puffs into the lungs every 6 (six) hours  as needed for shortness of breath.     . ALPHA LIPOIC ACID PO Take 1 capsule by mouth 2 (two) times daily.    Marland Kitchen buPROPion (WELLBUTRIN XL) 300 MG 24 hr tablet Take 300 mg by mouth every morning.     Marland Kitchen CALCIUM PO Take 1,800 mg by mouth daily.    . Cholecalciferol (VITAMIN D3) 2000 UNITS TABS Take 2,500 Units by mouth.      . clonazePAM (KLONOPIN) 1 MG tablet Take 1 tablet (1 mg total) by mouth daily. 30 tablet 5  . diazepam (VALIUM) 10 MG tablet Take 10 mg by mouth every 6 (six) hours as needed for anxiety.    Marland Kitchen escitalopram (LEXAPRO) 10 MG tablet Take 10 mg by mouth daily.     Marland Kitchen HYDROcodone-acetaminophen (VICODIN) 5-500 MG per tablet Take 1 tablet by mouth every 6 (six) hours as needed for pain.     Marland Kitchen l-methylfolate-B6-B12 (METANX) 3-35-2 MG TABS tablet Take 1 tablet by mouth 2 (two) times daily.    . mometasone-formoterol (DULERA) 100-5 MCG/ACT AERO Inhale 2 puffs into the lungs 2 (two) times daily.    . montelukast (SINGULAIR) 10 MG tablet Take 10 mg by mouth at bedtime.      Marland Kitchen olmesartan-hydrochlorothiazide (BENICAR HCT) 20-12.5 MG per tablet Take 1 tablet by mouth daily.      . temazepam (RESTORIL) 30 MG capsule Take 1 capsule (30 mg total) by mouth at bedtime as needed. (Patient taking differently: Take 30 mg by mouth at bedtime as needed for sleep. ) 30 capsule 5  . traMADol (ULTRAM) 50 MG tablet Take 1 tablet (50 mg total) by mouth every 6 (six) hours as needed for pain. 15 tablet 0  . aspirin EC 325 MG tablet Take 1 tablet (325 mg total) by mouth daily. (Patient not taking: Reported on 01/26/2015) 30 tablet 0  . predniSONE (DELTASONE) 10 MG tablet Take 1 tablet (10 mg total) by mouth daily with breakfast. (Patient not taking: Reported on 01/26/2015) 7 tablet 0     Discharge Medications: Please see discharge summary for a list of discharge medications.  Relevant Imaging Results:  Relevant Lab Results:   Additional Information    Ligaya Cormier LEWIS, LCSW

## 2015-01-26 NOTE — ED Notes (Signed)
[phone] Son please update him at this contact.

## 2015-01-26 NOTE — Clinical Social Work Note (Signed)
Clinical Social Work Assessment  Patient Details  Name: [name] MRN: [id number] Date of Birth: 07-31-1930  Date of referral:  01/26/15               Reason for consult:  Facility Placement                Permission sought to share information with:  Case Manager, Customer service manager, Family Supports Permission granted to share information::  Yes, Verbal Permission Granted  Name::     Burna Forts, [phone] and Vernetta Honey, [phone]  Agency::  local area SNFs  Relationship::     Contact Information:     Housing/Transportation Living arrangements for the past 2 months:  [place] ([place]) Source of Information:  Patient Patient Interpreter Needed:  None Criminal Activity/Legal Involvement Pertinent to Current Situation/Hospitalization:  No - Comment as needed Significant Relationships:  Adult Children, Spouse Lives with:  Spouse Do you feel safe going back to the place where you live?  No Need for family participation in patient care:  Yes (Comment) Burna Forts, son, asks to be kept in loop.)  Care giving concerns:  Pt lives with her husband at [facility]. She provides care for her husband who is "virtually total care." Now that she is injured, she does not have anyone to care for her as she heals.   Social Worker assessment / plan:  Pt has fallen "more times than I can count" in the past 6 months, and reports she has fallen 4 times in last 3 days. She states she has balance problems. She fell this morning and hurt her hip, groin and thigh and is having great difficulty ambulating. Her husband is total care so he is unable to assist her now that she is hurt. [facility] at [facility]. Pt and family requesting admission and then placement. Pt not meeting criteria for admission but medical team feels short term SNF placement is appropriate. CSW faxed pt out to local facilities.  Awaiting to hear back.  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 Pt agreeable to placement, thanked CSW for her assistance.  Patient/Family's Understanding of and Emotional Response to Diagnosis, Current Treatment, and Prognosis:  Pt is visibly relieved to be able to get placement. Pt expresses some ambivalence, stating she knows many people who have disliked SNFs and she feels she won't like it either. However, she knows she cannot care for herself right now and she wants to do what is best so she can heal as quickly as possible.  Emotional Assessment Appearance:  Appears stated age, Disheveled Attitude/Demeanor/Rapport:  Other (calm and collected, appropriate) Affect (typically observed):  Calm Orientation:  Oriented to Self, Oriented to Place, Oriented to  Time, Oriented to Situation Alcohol / Substance use:  Never Used Psych involvement (Current and /or in the community):  No (Comment)  Discharge Needs  Concerns to be addressed:  No discharge needs identified Readmission within the last 30 days:  No Current discharge risk:  Dependent with Mobility Barriers to Discharge:  No Barriers Identified   Raigan Baria LEWIS, LCSW 01/26/2015, 2:56 PM

## 2015-01-26 NOTE — ED Notes (Signed)
[phone] [name] pt's son would like to be called with admission updates

## 2015-01-26 NOTE — ED Notes (Signed)
Bed: WA02 Expected date: 01/26/15 Expected time: 8:53 AM Means of arrival: Ambulance Comments: Fall from [place], thigh pain

## 2015-01-26 NOTE — ED Provider Notes (Signed)
CSN: [id number]     Arrival date & time 01/26/15  0907 History   First MD Initiated Contact with Patient 01/26/15 [phone]     Chief Complaint  Patient presents with  . Hip Pain  . Fall     (Consider location/radiation/quality/duration/timing/severity/associated sxs/prior Treatment) HPI    Dr. Rolena Infante and Dr. Maureen Ralphs have both done surgery on the right hip.  PCP: Sheela Stack, MD [name] is a 81 y.o.  female  Patient was trying to help her husband last night when she set her cane down to reach for her glasses behind her causing her to loose her balance and fall backward right on her right hip. Denies hitting her head, loc, or injuring her neck. She is not on blood thinners. She was able to get up and was very painfully able to hobble to bed. This morning using her walker she was barely able to hobble around and realized she needed to get looked out. She believes she dislocated or broke her hip.   She last ate or drank last night. She took two 5 mg Oxycodone   Past Medical History  Diagnosis Date  . Asthma   . Depression   . Hypertension   . Arthritis   . Diverticulosis of colon   . History of colon polyps   . Insomnia   . Breast cancer ([place]) 2006    right w/lumpectomy  . Endometrial hyperplasia 01/1996    fibroids, adenomyosis  . Spinal stenosis 2001  . Osteopenia 12/08    hip  . Vitamin D deficiency   . Diabetes ([place]) 2007   Past Surgical History  Procedure Laterality Date  . Cervical fusion    . Cervical laminectomy  1975    x4  . Lumbar laminectomy  10/06    with spinal stenosis  . Cholecystectomy  11/00    lap chole  . Total hip arthroplasty Right 8/00  . Breast lumpectomy Right 03/2003  . Manipulation of hip for dislocation    . Tonsillectomy and adenoidectomy    . Carpal tunnel release    . Tubal ligation Bilateral 1968  . Rotator cuff repair Right 11/96  . Hysteroscopy  10/97    D&C, postmenopausal bleeding  . Total abdominal hysterectomy   12/97    endo hyperplasia, fibroids, adenomyosis  . Bilateral salpingoophorectomy  12/97  . Total knee arthroplasty Left 06/2003  . Total knee arthroplasty Right 10/06   Family History  Problem Relation Age of Onset  . Diabetes Father   . Colon cancer Father 41  . Heart failure Father   . Diabetes Brother   . Diabetes Brother   . Arthritis Mother   . Thyroid disease Mother   . Osteoporosis Mother   . Osteoporosis Maternal Aunt   . Osteoporosis Maternal Grandmother    Social History  Substance Use Topics  . Smoking status: Former Smoker    Quit date: 04/24/1990  . Smokeless tobacco: Never Used  . Alcohol Use: 2.4 oz/week    4 Glasses of wine per week     Comment: occ   OB History    Gravida Para Term Preterm AB TAB SAB Ectopic Multiple Living   3 3 3             Review of Systems  Refer to HPI for pertinent positive and negative ROS. Otherwise all other review of systems are negative for this patient encounter.   Allergies  Oxycodone hcl; Morphine sulfate; and Sulfamethoxazole  Home  Medications   Prior to Admission medications   Medication Sig Start Date End Date Taking? Authorizing Provider  albuterol (PROAIR HFA) 108 (90 BASE) MCG/ACT inhaler Inhale 2 puffs into the lungs every 6 (six) hours as needed for shortness of breath.    Yes Historical Provider, MD  ALPHA LIPOIC ACID PO Take 1 capsule by mouth 2 (two) times daily.   Yes Historical Provider, MD  buPROPion (WELLBUTRIN XL) 300 MG 24 hr tablet Take 300 mg by mouth every morning.    Yes Historical Provider, MD  CALCIUM PO Take 1,800 mg by mouth daily.   Yes Historical Provider, MD  Cholecalciferol (VITAMIN D3) 2000 UNITS TABS Take 2,500 Units by mouth.     Yes Historical Provider, MD  clonazePAM (KLONOPIN) 1 MG tablet Take 1 tablet (1 mg total) by mouth daily. 06/03/10  Yes Ricard Dillon, MD  diazepam (VALIUM) 10 MG tablet Take 10 mg by mouth every 6 (six) hours as needed for anxiety.   Yes Historical Provider, MD   escitalopram (LEXAPRO) 10 MG tablet Take 10 mg by mouth daily.  04/20/11  Yes Historical Provider, MD  HYDROcodone-acetaminophen (VICODIN) 5-500 MG per tablet Take 1 tablet by mouth every 6 (six) hours as needed for pain.    Yes Historical Provider, MD  l-methylfolate-B6-B12 (METANX) 3-35-2 MG TABS tablet Take 1 tablet by mouth 2 (two) times daily.   Yes Historical Provider, MD  mometasone-formoterol (DULERA) 100-5 MCG/ACT AERO Inhale 2 puffs into the lungs 2 (two) times daily.   Yes Historical Provider, MD  montelukast (SINGULAIR) 10 MG tablet Take 10 mg by mouth at bedtime.     Yes Historical Provider, MD  olmesartan-hydrochlorothiazide (BENICAR HCT) 20-12.5 MG per tablet Take 1 tablet by mouth daily.     Yes Historical Provider, MD  temazepam (RESTORIL) 30 MG capsule Take 1 capsule (30 mg total) by mouth at bedtime as needed. Patient taking differently: Take 30 mg by mouth at bedtime as needed for sleep.  06/03/10  Yes Ricard Dillon, MD  traMADol (ULTRAM) 50 MG tablet Take 1 tablet (50 mg total) by mouth every 6 (six) hours as needed for pain. 07/03/12  Yes Davonna Belling, MD  aspirin EC 325 MG tablet Take 1 tablet (325 mg total) by mouth daily. Patient not taking: Reported on 01/26/2015 07/03/12   Davonna Belling, MD  predniSONE (DELTASONE) 10 MG tablet Take 1 tablet (10 mg total) by mouth daily with breakfast. Patient not taking: Reported on 01/26/2015 07/18/14   Chauncey Cruel, MD   BP 115/71 mmHg  Pulse 73  Temp(Src) 98.7 F (37.1 C) (Oral)  Resp 18  SpO2 97% Physical Exam  Constitutional: She is oriented to person, place, and time. She appears well-developed and well-nourished. No distress.  HENT:  Head: Normocephalic and atraumatic.  Eyes: Pupils are equal, round, and reactive to light.  Neck: Normal range of motion. Neck supple.  Cardiovascular: Normal rate and regular rhythm.   Pulmonary/Chest: Effort normal.  Abdominal: Soft.  Musculoskeletal:       Right hip: She exhibits  decreased strength, tenderness and bony tenderness. She exhibits normal range of motion, no swelling, no crepitus, no deformity and no laceration.  NIV  Neurological: She is alert and oriented to person, place, and time.  Skin: Skin is warm and dry.  Nursing note and vitals reviewed.   ED Course  Procedures (including critical care time) Labs Review Labs Reviewed  BASIC METABOLIC PANEL  CBC WITH DIFFERENTIAL/PLATELET  PROTIME-INR  Imaging Review Ct Pelvis Wo Contrast  01/26/2015  CLINICAL DATA:  Right hip and thigh pain s/p recent fall. Fell last night (mechanical fall per EMS), this morning she woke up and is c/o right hip and thigh pain. Bilateral hip replacements EXAM: CT PELVIS WITHOUT CONTRAST TECHNIQUE: Multidetector CT imaging of the pelvis was performed following the standard protocol without intravenous contrast. COMPARISON:  Radiograph 10/21/2011 FINDINGS: Bilateral total hip arthroplasties noted. There is no evidence dislocation of the hips. No evidence of fracture of the RIGHT femur or acetabulum. No evidence of pelvic fracture sacral fracture. LEFT hip arthroplasty is also intact. Post hysterectomy. Bladder appears normal. Limited view of the bowel is unremarkable. Atherosclerotic calcification aorta. IMPRESSION: 1. No evidence of hip fracture or pelvic fracture by CT. 2. Bilateral Total hip arthroplasties without dislocation. Electronically Signed   By: Suzy Bouchard M.D.   On: 01/26/2015 11:03   Dg Hip Unilat With Pelvis 2-3 Views Right  01/26/2015  CLINICAL DATA:  Post slip and fall last evening while standing in her hallway at home now with right hip and thigh pain. History of right hip replacement approximately 10 years ago. EXAM: DG HIP (WITH OR WITHOUT PELVIS) 2-3V RIGHT COMPARISON:  CT abdomen pelvis - 04/17/2009 FINDINGS: Post right total hip replacement. Radiopaque ring encircling the caudal aspect of the right femoral head prosthesis is unchanged since scout  radiograph from abdominal CT performed 04/2009. No evidence of hardware failure or loosening. No fracture or dislocation. Limited visualization the contralateral left hip demonstrates sequela of left total hip replacement. No evidence of hardware failure or loosening given solitary AP projection. Limited visualization of the adjacent pelvis is normal. Several phleboliths overlie the lower pelvis bilaterally, right greater than left. Dermal calcifications are noted about the thighs bilaterally. No definite radiopaque foreign body. IMPRESSION: Post right total hip replacement without definite evidence of hardware failure or loosening. Electronically Signed   By: Sandi Mariscal M.D.   On: 01/26/2015 09:56   I have personally reviewed and evaluated these images and lab results as part of my medical decision-making.   EKG Interpretation None      MDM   Final diagnoses:  Fall, initial encounter  Unable to ambulate   Dr. Winfred Leeds has seen patient as well and agrees with workout and plan. Her xrays were negative   IMPRESSION: (right hip) Post right total hip replacement without definite evidence of hardware failure or loosening.  Her CT scan of the pelvis is also unremarkable.  11:40 am  Long discussion had with the patient and her son regarding placement vs home. She is at [facility] assisted living and would need a rehab facility to regain some strength and for extra help while healing from the fall. They have the ability to obtain extra help at [facility] an will inform me of there decision.  1:19 pm  social work has seen patient and reviewed her insurance plan. Patient is to be placed in a rehab facility from the Emergency Department. Likely placed today or by tomorrow morning.  Patient and Social Worker Alex have spoken directly and have come up with a plan they both agree with. I have ordered patients home medications, diet and fluids.  Filed Vitals:   01/26/15 0913 01/26/15 1117  BP:   115/71  Pulse:  73  Temp: 98.7 F (37.1 C)   Resp:  18     Delos Haring, PA-C 01/26/15 1322  ADDENDUM  Patient accepted to [facility]. Will be discharged from  the ED and transferred.  Filed Vitals:   01/26/15 1117 01/26/15 1419  BP: 115/71 108/63  Pulse: 73 90  Temp:    Resp: 18 [id number], PA-C 01/26/15 [place], MD 01/26/15 1641

## 2015-01-26 NOTE — Discharge Instructions (Signed)
Fall Prevention in the Home  Falls can cause injuries and can affect people from all age groups. There are many simple things that you can do to make your home safe and to help prevent falls. WHAT CAN I DO ON THE OUTSIDE OF MY HOME?  Regularly repair the edges of walkways and driveways and fix any cracks.  Remove high doorway thresholds.  Trim any shrubbery on the main path into your home.  Use bright outdoor lighting.  Clear walkways of debris and clutter, including tools and rocks.  Regularly check that handrails are securely fastened and in good repair. Both sides of any steps should have handrails.  Install guardrails along the edges of any raised decks or porches.  Have leaves, snow, and ice cleared regularly.  Use sand or salt on walkways during winter months.  In the garage, clean up any spills right away, including grease or oil spills. WHAT CAN I DO IN THE BATHROOM?  Use night lights.  Install grab bars by the toilet and in the tub and shower. Do not use towel bars as grab bars.  Use non-skid mats or decals on the floor of the tub or shower.  If you need to sit down while you are in the shower, use a plastic, non-slip stool..  Keep the floor dry. Immediately clean up any water that spills on the floor.  Remove soap buildup in the tub or shower on a regular basis.  Attach bath mats securely with double-sided non-slip rug tape.  Remove throw rugs and other tripping hazards from the floor. WHAT CAN I DO IN THE BEDROOM?  Use night lights.  Make sure that a bedside light is easy to reach.  Do not use oversized bedding that drapes onto the floor.  Have a firm chair that has side arms to use for getting dressed.  Remove throw rugs and other tripping hazards from the floor. WHAT CAN I DO IN THE KITCHEN?   Clean up any spills right away.  Avoid walking on wet floors.  Place frequently used items in easy-to-reach places.  If you need to reach for something  above you, use a sturdy step stool that has a grab bar.  Keep electrical cables out of the way.  Do not use floor polish or wax that makes floors slippery. If you have to use wax, make sure that it is non-skid floor wax.  Remove throw rugs and other tripping hazards from the floor. WHAT CAN I DO IN THE STAIRWAYS?  Do not leave any items on the stairs.  Make sure that there are handrails on both sides of the stairs. Fix handrails that are broken or loose. Make sure that handrails are as long as the stairways.  Check any carpeting to make sure that it is firmly attached to the stairs. Fix any carpet that is loose or worn.  Avoid having throw rugs at the top or bottom of stairways, or secure the rugs with carpet tape to prevent them from moving.  Make sure that you have a light switch at the top of the stairs and the bottom of the stairs. If you do not have them, have them installed. WHAT ARE SOME OTHER FALL PREVENTION TIPS?  Wear closed-toe shoes that fit well and support your feet. Wear shoes that have rubber soles or low heels.  When you use a stepladder, make sure that it is completely opened and that the sides are firmly locked. Have someone hold the ladder while you   are using it. Do not climb a closed stepladder.  Add color or contrast paint or tape to grab bars and handrails in your home. Place contrasting color strips on the first and last steps.  Use mobility aids as needed, such as canes, walkers, scooters, and crutches.  Turn on lights if it is dark. Replace any light bulbs that burn out.  Set up furniture so that there are clear paths. Keep the furniture in the same spot.  Fix any uneven floor surfaces.  Choose a carpet design that does not hide the edge of steps of a stairway.  Be aware of any and all pets.  Review your medicines with your healthcare provider. Some medicines can cause dizziness or changes in blood pressure, which increase your risk of falling. Talk  with your health care provider about other ways that you can decrease your risk of falls. This may include working with a physical therapist or trainer to improve your strength, balance, and endurance.   This information is not intended to replace advice given to you by your health care provider. Make sure you discuss any questions you have with your health care provider.   Document Released: 01/23/2002 Document Revised: 06/19/2014 Document Reviewed: 03/09/2014 Elsevier Interactive Patient Education 2016 [employer]  

## 2015-01-27 NOTE — Clinical Social Work Placement (Signed)
   CLINICAL SOCIAL WORK PLACEMENT  NOTE  Date:  01/27/2015  Patient Details  Name: [name] MRN: [id number] Date of Birth: 1930/08/16  Clinical Social Work is seeking post-discharge placement for this patient at the [place] level of care (*CSW will initial, date and re-position this form in  chart as items are completed):  Yes   Patient/family provided with [place] Work Department's list of facilities offering this level of care within the geographic area requested by the patient (or if unable, by the patient's family).  Yes   Patient/family informed of their freedom to choose among providers that offer the needed level of care, that participate in Medicare, Medicaid or managed care program needed by the patient, have an available bed and are willing to accept the patient.  Yes   Patient/family informed of [facility]'s ownership interest in [facility] and [facility], as well as of the fact that they are under no obligation to receive care at these facilities.  PASRR submitted to EDS on 01/26/15     PASRR number received on 01/26/15     Existing PASRR number confirmed on       FL2 transmitted to all facilities in geographic area requested by pt/family on 01/26/15     FL2 transmitted to all facilities within larger geographic area on       Patient informed that his/her managed care company has contracts with or will negotiate with certain facilities, including the following:        Yes   Patient/family informed of bed offers received.  Patient chooses bed at [facility]     Physician recommends and patient chooses bed at [facility]    Patient to be transferred to [facility] on 01/26/15.  Patient to be transferred to facility by [facility]     Patient family notified on 01/26/15 of transfer.  Name of family member notified:  Burna Forts, son     PHYSICIAN Please sign FL2, Please  prepare prescriptions, Please prepare priority discharge summary, including medications     Additional Comment:    _______________________________________________ Lucia Bitter, LCSW 01/27/2015, 8:26 AM

## 2015-02-11 ENCOUNTER — Encounter (HOSPITAL_COMMUNITY): Payer: Self-pay | Admitting: Emergency Medicine

## 2015-02-11 ENCOUNTER — Emergency Department (HOSPITAL_COMMUNITY)
Admission: EM | Admit: 2015-02-11 | Discharge: 2015-02-11 | Disposition: A | Discharge status: Home / Self Care | Payer: Medicare Other | Attending: Emergency Medicine | Admitting: Emergency Medicine

## 2015-02-11 ENCOUNTER — Emergency Department (HOSPITAL_COMMUNITY): Payer: Medicare Other

## 2015-02-11 DIAGNOSIS — E559 Vitamin D deficiency, unspecified: Secondary | ICD-10-CM | POA: Diagnosis not present

## 2015-02-11 DIAGNOSIS — F329 Major depressive disorder, single episode, unspecified: Secondary | ICD-10-CM | POA: Insufficient documentation

## 2015-02-11 DIAGNOSIS — M1990 Unspecified osteoarthritis, unspecified site: Secondary | ICD-10-CM | POA: Diagnosis not present

## 2015-02-11 DIAGNOSIS — E119 Type 2 diabetes mellitus without complications: Secondary | ICD-10-CM | POA: Diagnosis not present

## 2015-02-11 DIAGNOSIS — Z8742 Personal history of other diseases of the female genital tract: Secondary | ICD-10-CM | POA: Diagnosis not present

## 2015-02-11 DIAGNOSIS — Z87891 Personal history of nicotine dependence: Secondary | ICD-10-CM | POA: Diagnosis not present

## 2015-02-11 DIAGNOSIS — J159 Unspecified bacterial pneumonia: Secondary | ICD-10-CM | POA: Insufficient documentation

## 2015-02-11 DIAGNOSIS — G4700 Insomnia, unspecified: Secondary | ICD-10-CM | POA: Insufficient documentation

## 2015-02-11 DIAGNOSIS — Z86010 Personal history of colonic polyps: Secondary | ICD-10-CM | POA: Insufficient documentation

## 2015-02-11 DIAGNOSIS — Z7982 Long term (current) use of aspirin: Secondary | ICD-10-CM | POA: Diagnosis not present

## 2015-02-11 DIAGNOSIS — Z853 Personal history of malignant neoplasm of breast: Secondary | ICD-10-CM | POA: Diagnosis not present

## 2015-02-11 DIAGNOSIS — Z8719 Personal history of other diseases of the digestive system: Secondary | ICD-10-CM | POA: Insufficient documentation

## 2015-02-11 DIAGNOSIS — M8580 Other specified disorders of bone density and structure, unspecified site: Secondary | ICD-10-CM | POA: Insufficient documentation

## 2015-02-11 DIAGNOSIS — Z79899 Other long term (current) drug therapy: Secondary | ICD-10-CM | POA: Diagnosis not present

## 2015-02-11 DIAGNOSIS — R509 Fever, unspecified: Secondary | ICD-10-CM | POA: Diagnosis present

## 2015-02-11 DIAGNOSIS — I10 Essential (primary) hypertension: Secondary | ICD-10-CM | POA: Insufficient documentation

## 2015-02-11 DIAGNOSIS — J189 Pneumonia, unspecified organism: Secondary | ICD-10-CM

## 2015-02-11 DIAGNOSIS — J45909 Unspecified asthma, uncomplicated: Secondary | ICD-10-CM | POA: Diagnosis not present

## 2015-02-11 LAB — CBC WITH DIFFERENTIAL/PLATELET
BASOS ABS: 0 10*3/uL (ref 0.0–0.1)
BASOS PCT: 0 %
EOS ABS: 0.1 10*3/uL (ref 0.0–0.7)
EOS PCT: 0 %
HCT: 40.7 % (ref 36.0–46.0)
Hemoglobin: 13.5 g/dL (ref 12.0–15.0)
Lymphocytes Relative: 3 %
Lymphs Abs: 0.5 10*3/uL — ABNORMAL LOW (ref 0.7–4.0)
MCH: 32.2 pg (ref 26.0–34.0)
MCHC: 33.2 g/dL (ref 30.0–36.0)
MCV: 97.1 fL (ref 78.0–100.0)
MONO ABS: 1 10*3/uL (ref 0.1–1.0)
Monocytes Relative: 7 %
Neutro Abs: 12.6 10*3/uL — ABNORMAL HIGH (ref 1.7–7.7)
Neutrophils Relative %: 90 %
Platelets: 305 10*3/uL (ref 150–400)
RBC: 4.19 MIL/uL (ref 3.87–5.11)
RDW: 13.2 % (ref 11.5–15.5)
WBC: 14.1 10*3/uL — AB (ref 4.0–10.5)

## 2015-02-11 LAB — BASIC METABOLIC PANEL
Anion gap: 10 (ref 5–15)
BUN: 18 mg/dL (ref 6–20)
CALCIUM: 9.1 mg/dL (ref 8.9–10.3)
CO2: 28 mmol/L (ref 22–32)
CREATININE: 0.58 mg/dL (ref 0.44–1.00)
Chloride: 99 mmol/L — ABNORMAL LOW (ref 101–111)
GFR calc Af Amer: >60 mL/min (ref >60)
Glucose, Bld: 118 mg/dL — ABNORMAL HIGH (ref 65–99)
POTASSIUM: 4.2 mmol/L (ref 3.5–5.1)
SODIUM: 137 mmol/L (ref 135–145)

## 2015-02-11 MED ORDER — AZITHROMYCIN 250 MG PO TABS
500.0000 mg | ORAL_TABLET | Freq: Once | ORAL | Status: AC
  Administered 2015-02-11: 500 mg via ORAL
  Filled 2015-02-11: qty 2

## 2015-02-11 MED ORDER — AZITHROMYCIN 250 MG PO TABS: 250.0000 mg | ORAL_TABLET | Freq: Every day | ORAL | Status: DC

## 2015-02-11 MED ORDER — ACETAMINOPHEN 500 MG PO TABS
1000.0000 mg | ORAL_TABLET | Freq: Once | ORAL | Status: AC
  Administered 2015-02-11: 1000 mg via ORAL
  Filled 2015-02-11: qty 2

## 2015-02-11 MED ORDER — CEFTRIAXONE SODIUM 1 G IJ SOLR
1.0000 g | Freq: Once | INTRAMUSCULAR | Status: AC
  Administered 2015-02-11: 1 g via INTRAVENOUS
  Filled 2015-02-11: qty 10

## 2015-02-11 MED ORDER — ACETAMINOPHEN 500 MG PO TABS
500.0000 mg | ORAL_TABLET | Freq: Once | ORAL | Status: DC
  Filled 2015-02-11: qty 1

## 2015-02-11 NOTE — ED Notes (Signed)
Pt reports she is also here for hip pain that has not resolved since her fall on 12/19. Pt had fall in rehab center but son removed patient from rehab center without discharge and patient in pain on R hip.

## 2015-02-11 NOTE — ED Provider Notes (Signed)
CSN: [id number]     Arrival date & time 02/11/15  1246 History   First MD Initiated Contact with Patient 02/11/15 1506     Chief Complaint  Patient presents with  . Fever     (Consider location/radiation/quality/duration/timing/severity/associated sxs/prior Treatment) HPI Patient poor she's had cough for 3 days. She states she has a history of asthma. She reports it has been some mucus with her cough. She reports that she "briefly" had a fever to 101. She denies chest pain or shortness of breath. The patient reports that she called her doctor because she thought she needed a chest x-ray to check for pneumonia. She reports her doctor told her she should probably go to the hospital to get it checked out. She reports she really just wanted to get treated and get out of here. She reports she's had no difficulty eating or drinking. She denies vomiting or diarrhea. She denies abdominal pain. She denies dizziness or lightheadedness. The patient poor she has had "both pneumonia shots". Past Medical History  Diagnosis Date  . Asthma   . Depression   . Hypertension   . Arthritis   . Diverticulosis of colon   . History of colon polyps   . Insomnia   . Breast cancer ([place]) 2006    right w/lumpectomy  . Endometrial hyperplasia 01/1996    fibroids, adenomyosis  . Spinal stenosis 2001  . Osteopenia 12/08    hip  . Vitamin D deficiency   . Diabetes ([place]) 2007   Past Surgical History  Procedure Laterality Date  . Cervical fusion    . Cervical laminectomy  1975    x4  . Lumbar laminectomy  10/06    with spinal stenosis  . Cholecystectomy  11/00    lap chole  . Total hip arthroplasty Right 8/00  . Breast lumpectomy Right 03/2003  . Manipulation of hip for dislocation    . Tonsillectomy and adenoidectomy    . Carpal tunnel release    . Tubal ligation Bilateral 1968  . Rotator cuff repair Right 11/96  . Hysteroscopy  10/97    D&C, postmenopausal bleeding  . Total abdominal hysterectomy   12/97    endo hyperplasia, fibroids, adenomyosis  . Bilateral salpingoophorectomy  12/97  . Total knee arthroplasty Left 06/2003  . Total knee arthroplasty Right 10/06   Family History  Problem Relation Age of Onset  . Diabetes Father   . Colon cancer Father 20  . Heart failure Father   . Diabetes Brother   . Diabetes Brother   . Arthritis Mother   . Thyroid disease Mother   . Osteoporosis Mother   . Osteoporosis Maternal Aunt   . Osteoporosis Maternal Grandmother    Social History  Substance Use Topics  . Smoking status: Former Smoker    Quit date: 04/24/1990  . Smokeless tobacco: Never Used  . Alcohol Use: 2.4 oz/week    4 Glasses of wine per week     Comment: occ   OB History    Gravida Para Term Preterm AB TAB SAB Ectopic Multiple Living   3 3 3             Review of Systems  10 Systems reviewed and are negative for acute change except as noted in the HPI.   Allergies  Oxycodone hcl; Morphine sulfate; and Sulfamethoxazole  Home Medications   Prior to Admission medications   Medication Sig Start Date End Date Taking? Authorizing Provider  albuterol (PROAIR HFA) 108 (90  BASE) MCG/ACT inhaler Inhale 2 puffs into the lungs every 6 (six) hours as needed for shortness of breath.    Yes Historical Provider, MD  ALPHA LIPOIC ACID PO Take 1 capsule by mouth 2 (two) times daily.   Yes Historical Provider, MD  aspirin EC 325 MG tablet Take 1 tablet (325 mg total) by mouth daily. 07/03/12  Yes Davonna Belling, MD  buPROPion (WELLBUTRIN XL) 300 MG 24 hr tablet Take 300 mg by mouth every morning.    Yes Historical Provider, MD  CALCIUM PO Take 1,800 mg by mouth daily.   Yes Historical Provider, MD  Cholecalciferol (VITAMIN D3) 2000 UNITS TABS Take 2,000 Units by mouth.    Yes Historical Provider, MD  clonazePAM (KLONOPIN) 1 MG tablet Take 1 tablet (1 mg total) by mouth daily. 06/03/10  Yes Ricard Dillon, MD  diazepam (VALIUM) 10 MG tablet Take 10 mg by mouth every 6 (six) hours  as needed for anxiety.   Yes Historical Provider, MD  escitalopram (LEXAPRO) 10 MG tablet Take 10 mg by mouth daily.  04/20/11  Yes Historical Provider, MD  HYDROcodone-acetaminophen (VICODIN) 5-500 MG per tablet Take 1 tablet by mouth every 6 (six) hours as needed for pain.    Yes Historical Provider, MD  l-methylfolate-B6-B12 (METANX) 3-35-2 MG TABS tablet Take 1 tablet by mouth 2 (two) times daily.   Yes Historical Provider, MD  mometasone-formoterol (DULERA) 100-5 MCG/ACT AERO Inhale 2 puffs into the lungs 2 (two) times daily.   Yes Historical Provider, MD  montelukast (SINGULAIR) 10 MG tablet Take 10 mg by mouth at bedtime.     Yes Historical Provider, MD  olmesartan-hydrochlorothiazide (BENICAR HCT) 20-12.5 MG per tablet Take 1 tablet by mouth daily.     Yes Historical Provider, MD  Probiotic Product (PROBIOTIC DAILY PO) Take 1 capsule by mouth daily.   Yes Historical Provider, MD  temazepam (RESTORIL) 30 MG capsule Take 1 capsule (30 mg total) by mouth at bedtime as needed. Patient taking differently: Take 30 mg by mouth at bedtime as needed for sleep.  06/03/10  Yes Ricard Dillon, MD  azithromycin (ZITHROMAX) 250 MG tablet Take 1 tablet (250 mg total) by mouth daily. 1 every day until finished. 02/11/15   Charlesetta Shanks, MD  predniSONE (DELTASONE) 10 MG tablet Take 1 tablet (10 mg total) by mouth daily with breakfast. Patient not taking: Reported on 01/26/2015 07/18/14   Chauncey Cruel, MD  traMADol (ULTRAM) 50 MG tablet Take 1 tablet (50 mg total) by mouth every 6 (six) hours as needed for pain. Patient not taking: Reported on 02/11/2015 07/03/12   Davonna Belling, MD   BP 121/83 mmHg  Pulse 99  Temp(Src) 98.8 F (37.1 C) (Oral)  Resp 16  SpO2 94% Physical Exam  Constitutional: She is oriented to person, place, and time. She appears well-developed and well-nourished.  HENT:  Head: Normocephalic and atraumatic.  Mouth/Throat: Oropharynx is clear and moist.  Eyes: EOM are normal.  Pupils are equal, round, and reactive to light.  Neck: Neck supple.  Cardiovascular: Normal rate, regular rhythm, normal heart sounds and intact distal pulses.   Pulmonary/Chest: Effort normal.  Patient has cough with deep inspiration. There was slight crackle and rhonchi at the right base that cleared with coughing. Good air flow to bases. No significant wheeze.  Abdominal: Soft. Bowel sounds are normal. She exhibits no distension. There is no tenderness.  Musculoskeletal: Normal range of motion. She exhibits no edema or tenderness.  Neurological: She  is alert and oriented to person, place, and time. She has normal strength. No cranial nerve deficit. She exhibits normal muscle tone. Coordination normal. GCS eye subscore is 4. GCS verbal subscore is 5. GCS motor subscore is 6.  Skin: Skin is warm, dry and intact.  Psychiatric: She has a normal mood and affect.    ED Course  Procedures (including critical care time) Labs Review Labs Reviewed  BASIC METABOLIC PANEL - Abnormal; Notable for the following:    Chloride 99 (*)    Glucose, Bld 118 (*)    All other components within normal limits  CBC WITH DIFFERENTIAL/PLATELET - Abnormal; Notable for the following:    WBC 14.1 (*)    Neutro Abs 12.6 (*)    Lymphs Abs 0.5 (*)    All other components within normal limits    Imaging Review Dg Chest 2 View  02/11/2015  CLINICAL DATA:  Productive cough. EXAM: CHEST - 2 VIEW COMPARISON:  Chest x-ray 09/28/2011 FINDINGS: Emphysematous changes are again seen. The heart size is normal. Ill-defined left lower lobe airspace disease is present. There is no edema or effusion to suggest failure. The visualized soft tissues and bony thorax are unremarkable. IMPRESSION: 1. Left lower lobe pneumonia. 2. No acute cardiopulmonary disease. Electronically Signed   By: San Morelle M.D.   On: 02/11/2015 14:21   I have personally reviewed and evaluated these images and lab results as part of my medical  decision-making.   EKG Interpretation None      MDM   Final diagnoses:  Community acquired pneumonia   Patient is alert and nontoxic. She does not have respiratory distress or vital sign instability. The patient is very emphatic about wanting to be treated at home. She lives in an assisted living environment and underwent physical therapy rehabilitation for hip strain, but has not had recent hospitalization. At this time she will be treated for community-acquired pneumonia with return instructions for any changing or worsening in her condition. She is also counseled on the necessity of close follow-up with her family physician.    Charlesetta Shanks, MD 02/11/15 [id number]

## 2015-02-11 NOTE — ED Notes (Addendum)
Pt 89% RA, pt states hx of asthma, but not on home O2.

## 2015-02-11 NOTE — Discharge Instructions (Signed)

## 2015-02-11 NOTE — ED Notes (Signed)
Pt with fever x 3 days, productive cough.

## 2015-02-15 ENCOUNTER — Encounter (HOSPITAL_COMMUNITY): Payer: Self-pay | Admitting: Emergency Medicine

## 2015-02-15 ENCOUNTER — Emergency Department (HOSPITAL_COMMUNITY)
Admission: EM | Admit: 2015-02-15 | Discharge: 2015-02-16 | Discharge status: Against Medical Advice | Payer: Medicare Other | Attending: Emergency Medicine | Admitting: Emergency Medicine

## 2015-02-15 DIAGNOSIS — J45909 Unspecified asthma, uncomplicated: Secondary | ICD-10-CM | POA: Insufficient documentation

## 2015-02-15 DIAGNOSIS — Y998 Other external cause status: Secondary | ICD-10-CM | POA: Diagnosis not present

## 2015-02-15 DIAGNOSIS — Y9289 Other specified places as the place of occurrence of the external cause: Secondary | ICD-10-CM | POA: Insufficient documentation

## 2015-02-15 DIAGNOSIS — S3992XA Unspecified injury of lower back, initial encounter: Secondary | ICD-10-CM | POA: Insufficient documentation

## 2015-02-15 DIAGNOSIS — W1839XA Other fall on same level, initial encounter: Secondary | ICD-10-CM | POA: Diagnosis not present

## 2015-02-15 DIAGNOSIS — Y9389 Activity, other specified: Secondary | ICD-10-CM | POA: Diagnosis not present

## 2015-02-15 DIAGNOSIS — E119 Type 2 diabetes mellitus without complications: Secondary | ICD-10-CM | POA: Diagnosis not present

## 2015-02-15 DIAGNOSIS — I10 Essential (primary) hypertension: Secondary | ICD-10-CM | POA: Diagnosis not present

## 2015-02-15 NOTE — ED Notes (Signed)
Patient reports falling two days ago, history of same, and now c/o right sided lower back pain radiating down to right foot.

## 2015-02-21 ENCOUNTER — Emergency Department (HOSPITAL_COMMUNITY): Payer: Medicare Other

## 2015-02-21 ENCOUNTER — Emergency Department (HOSPITAL_COMMUNITY)
Admission: EM | Admit: 2015-02-21 | Discharge: 2015-02-22 | Disposition: A | Discharge status: Home / Self Care | Payer: Medicare Other | Attending: Emergency Medicine | Admitting: Emergency Medicine

## 2015-02-21 ENCOUNTER — Encounter (HOSPITAL_COMMUNITY): Payer: Self-pay | Admitting: Emergency Medicine

## 2015-02-21 DIAGNOSIS — E559 Vitamin D deficiency, unspecified: Secondary | ICD-10-CM | POA: Insufficient documentation

## 2015-02-21 DIAGNOSIS — Z96652 Presence of left artificial knee joint: Secondary | ICD-10-CM | POA: Diagnosis not present

## 2015-02-21 DIAGNOSIS — G4700 Insomnia, unspecified: Secondary | ICD-10-CM | POA: Insufficient documentation

## 2015-02-21 DIAGNOSIS — Z87448 Personal history of other diseases of urinary system: Secondary | ICD-10-CM | POA: Diagnosis not present

## 2015-02-21 DIAGNOSIS — E119 Type 2 diabetes mellitus without complications: Secondary | ICD-10-CM | POA: Insufficient documentation

## 2015-02-21 DIAGNOSIS — M79671 Pain in right foot: Secondary | ICD-10-CM

## 2015-02-21 DIAGNOSIS — Z7951 Long term (current) use of inhaled steroids: Secondary | ICD-10-CM | POA: Diagnosis not present

## 2015-02-21 DIAGNOSIS — Z8719 Personal history of other diseases of the digestive system: Secondary | ICD-10-CM | POA: Insufficient documentation

## 2015-02-21 DIAGNOSIS — Z87891 Personal history of nicotine dependence: Secondary | ICD-10-CM | POA: Diagnosis not present

## 2015-02-21 DIAGNOSIS — Z96651 Presence of right artificial knee joint: Secondary | ICD-10-CM | POA: Insufficient documentation

## 2015-02-21 DIAGNOSIS — S99921A Unspecified injury of right foot, initial encounter: Secondary | ICD-10-CM | POA: Insufficient documentation

## 2015-02-21 DIAGNOSIS — S8991XA Unspecified injury of right lower leg, initial encounter: Secondary | ICD-10-CM | POA: Insufficient documentation

## 2015-02-21 DIAGNOSIS — Y998 Other external cause status: Secondary | ICD-10-CM | POA: Diagnosis not present

## 2015-02-21 DIAGNOSIS — M25551 Pain in right hip: Secondary | ICD-10-CM

## 2015-02-21 DIAGNOSIS — Z96641 Presence of right artificial hip joint: Secondary | ICD-10-CM | POA: Insufficient documentation

## 2015-02-21 DIAGNOSIS — F329 Major depressive disorder, single episode, unspecified: Secondary | ICD-10-CM | POA: Diagnosis not present

## 2015-02-21 DIAGNOSIS — I10 Essential (primary) hypertension: Secondary | ICD-10-CM | POA: Diagnosis not present

## 2015-02-21 DIAGNOSIS — M1990 Unspecified osteoarthritis, unspecified site: Secondary | ICD-10-CM | POA: Diagnosis not present

## 2015-02-21 DIAGNOSIS — S99911A Unspecified injury of right ankle, initial encounter: Secondary | ICD-10-CM | POA: Diagnosis not present

## 2015-02-21 DIAGNOSIS — Y92128 Other place in nursing home as the place of occurrence of the external cause: Secondary | ICD-10-CM | POA: Diagnosis not present

## 2015-02-21 DIAGNOSIS — S79911A Unspecified injury of right hip, initial encounter: Secondary | ICD-10-CM | POA: Insufficient documentation

## 2015-02-21 DIAGNOSIS — Z7982 Long term (current) use of aspirin: Secondary | ICD-10-CM | POA: Diagnosis not present

## 2015-02-21 DIAGNOSIS — J45909 Unspecified asthma, uncomplicated: Secondary | ICD-10-CM | POA: Diagnosis not present

## 2015-02-21 DIAGNOSIS — Z79899 Other long term (current) drug therapy: Secondary | ICD-10-CM | POA: Insufficient documentation

## 2015-02-21 DIAGNOSIS — Z853 Personal history of malignant neoplasm of breast: Secondary | ICD-10-CM | POA: Diagnosis not present

## 2015-02-21 DIAGNOSIS — Z86018 Personal history of other benign neoplasm: Secondary | ICD-10-CM | POA: Diagnosis not present

## 2015-02-21 DIAGNOSIS — W1839XA Other fall on same level, initial encounter: Secondary | ICD-10-CM | POA: Diagnosis not present

## 2015-02-21 DIAGNOSIS — W19XXXA Unspecified fall, initial encounter: Secondary | ICD-10-CM

## 2015-02-21 DIAGNOSIS — Y9389 Activity, other specified: Secondary | ICD-10-CM | POA: Insufficient documentation

## 2015-02-21 DIAGNOSIS — M8580 Other specified disorders of bone density and structure, unspecified site: Secondary | ICD-10-CM | POA: Diagnosis not present

## 2015-02-21 DIAGNOSIS — Z86010 Personal history of colonic polyps: Secondary | ICD-10-CM | POA: Diagnosis not present

## 2015-02-21 MED ORDER — HYDROCODONE-ACETAMINOPHEN 5-325 MG PO TABS
1.0000 | ORAL_TABLET | Freq: Once | ORAL | Status: AC
  Administered 2015-02-21: 1 via ORAL
  Filled 2015-02-21: qty 1

## 2015-02-21 NOTE — ED Notes (Signed)
[facility] presents with a 82 yo female from [place] with right ankle pain possibly from past fall.  Pt has redness and swelling to right ankle area reports for at least a month that she hasn't been able to ambulate on her right ankle/foot.  Pt states many recent falls including falls at [facility] on 12/15 and that she couldn't bear weight on the right foot/ankle since then.  Pt reports having had several x-rays including on her head but nothing was found.  No deformity or crepitus reported by [facility].  Pt. Reports 2/10 pain.  VS stable.

## 2015-02-21 NOTE — Progress Notes (Signed)
CSW met with patient at bedside. Daughter was present. Daughter states that she would like patient to be placed from [facility] to [facility]. She states that patient was a resident at [facility] in December, and staff informed her if she needed to come back to their facility she could. Daughter states that she does not feel patient should return to her independent living facility [facility] because she is unable to care for herself and cannot walk.  CSW spoke with nurse who states he will walk patient.  CSW informed daughter that at this time of the night it would not be possible to admit the patient to a facility. Daughter states that the patient has an appointment tomorrow at [facility] with her doctor.   CSW made PA aware of conversation with daughter. CSW staffed patient with [facility] [employer], quantity. CSW consulted with Nurse CM to speak with patient about home health options.  Willette Brace [phone] ED CSW 02/21/2015 11:57 PM

## 2015-02-21 NOTE — ED Notes (Signed)
PA at the bedside.

## 2015-02-21 NOTE — Discharge Instructions (Signed)
Continue pain medications. Keep leg elevated, ice several times a day. Follow up with your doctor as scheduled tomorrow.

## 2015-02-21 NOTE — ED Provider Notes (Signed)
CSN: [id number]     Arrival date & time 02/21/15  1636 History  By signing my name below, I, Jolayne Panther, attest that this documentation has been prepared under the direction and in the presence of Nyko Gell, PA-C. Electronically Signed: Jolayne Panther, Scribe. 02/21/2015. 8:24 PM.   Chief Complaint  Patient presents with  . Ankle Pain  . history of falls      The history is provided by the patient. No language interpreter was used.   HPI Comments: [name] is a 82 y.o. female with a hx of falls due to loss of balance, who presents to the Emergency Department complaining of constant, mild, right hip, knee cap, foot and ankle pain onset this morning when she woke up with associated difficulty ambulating and weight bearing. Her daughter reports that she has not been able to put much weight on her right leg since a fall on December 15th. After her fall she was then placed in [facility] until she was released December 23rd and has mostly been in a wheelchair since. Pt normally ambulated with a walker before her fall on December 15th and her last fall was reported to be nine days ago. Pt also reports she was diagnosed with PNA ten days ago and was prescribed antibiotics which she has finished taking. Pt has taken two tramadol and three advil today with little to no relief. She is unsure of fever.     Past Medical History  Diagnosis Date  . Asthma   . Depression   . Hypertension   . Arthritis   . Diverticulosis of colon   . History of colon polyps   . Insomnia   . Breast cancer ([place]) 2006    right w/lumpectomy  . Endometrial hyperplasia 01/1996    fibroids, adenomyosis  . Spinal stenosis 2001  . Osteopenia 12/08    hip  . Vitamin D deficiency   . Diabetes ([place]) 2007   Past Surgical History  Procedure Laterality Date  . Cervical fusion    . Cervical laminectomy  1975    x4  . Lumbar laminectomy  10/06    with spinal stenosis  . Cholecystectomy  11/00     lap chole  . Total hip arthroplasty Right 8/00  . Breast lumpectomy Right 03/2003  . Manipulation of hip for dislocation    . Tonsillectomy and adenoidectomy    . Carpal tunnel release    . Tubal ligation Bilateral 1968  . Rotator cuff repair Right 11/96  . Hysteroscopy  10/97    D&C, postmenopausal bleeding  . Total abdominal hysterectomy  12/97    endo hyperplasia, fibroids, adenomyosis  . Bilateral salpingoophorectomy  12/97  . Total knee arthroplasty Left 06/2003  . Total knee arthroplasty Right 10/06   Family History  Problem Relation Age of Onset  . Diabetes Father   . Colon cancer Father 65  . Heart failure Father   . Diabetes Brother   . Diabetes Brother   . Arthritis Mother   . Thyroid disease Mother   . Osteoporosis Mother   . Osteoporosis Maternal Aunt   . Osteoporosis Maternal Grandmother    Social History  Substance Use Topics  . Smoking status: Former Smoker    Quit date: 04/24/1990  . Smokeless tobacco: Never Used  . Alcohol Use: 2.4 oz/week    4 Glasses of wine per week     Comment: occ   OB History    Gravida Para Term Preterm  AB TAB SAB Ectopic Multiple Living   3 3 3             Review of Systems  Constitutional: Negative for fever.  Musculoskeletal: Positive for myalgias, arthralgias and gait problem.   Allergies  Oxycodone hcl; Morphine sulfate; and Sulfamethoxazole  Home Medications   Prior to Admission medications   Medication Sig Start Date End Date Taking? Authorizing Provider  albuterol (PROAIR HFA) 108 (90 BASE) MCG/ACT inhaler Inhale 2 puffs into the lungs every 6 (six) hours as needed for shortness of breath.    Yes Historical Provider, MD  ALPHA LIPOIC ACID PO Take 1 capsule by mouth every evening.    Yes Historical Provider, MD  aspirin EC 325 MG tablet Take 1 tablet (325 mg total) by mouth daily. 07/03/12  Yes Davonna Belling, MD  buPROPion (WELLBUTRIN XL) 300 MG 24 hr tablet Take 300 mg by mouth every morning.    Yes  Historical Provider, MD  calcium carbonate (OS-CAL - DOSED IN MG OF ELEMENTAL CALCIUM) 1250 (500 Ca) MG tablet Take 1 tablet by mouth daily with breakfast.   Yes Historical Provider, MD  Cholecalciferol (VITAMIN D3) 2000 UNITS TABS Take 2,000 Units by mouth.    Yes Historical Provider, MD  clonazePAM (KLONOPIN) 1 MG tablet Take 1 tablet (1 mg total) by mouth daily. 06/03/10  Yes Ricard Dillon, MD  diazepam (VALIUM) 10 MG tablet Take 10 mg by mouth every 6 (six) hours as needed for anxiety.   Yes Historical Provider, MD  escitalopram (LEXAPRO) 10 MG tablet Take 10 mg by mouth daily.  04/20/11  Yes Historical Provider, MD  HYDROcodone-acetaminophen (VICODIN) 5-500 MG per tablet Take 1 tablet by mouth every 6 (six) hours as needed for pain.    Yes Historical Provider, MD  l-methylfolate-B6-B12 (METANX) 3-35-2 MG TABS tablet Take 1 tablet by mouth 2 (two) times daily.   Yes Historical Provider, MD  mometasone-formoterol (DULERA) 100-5 MCG/ACT AERO Inhale 2 puffs into the lungs 2 (two) times daily.   Yes Historical Provider, MD  montelukast (SINGULAIR) 10 MG tablet Take 10 mg by mouth at bedtime.     Yes Historical Provider, MD  olmesartan-hydrochlorothiazide (BENICAR HCT) 20-12.5 MG per tablet Take 1 tablet by mouth daily.     Yes Historical Provider, MD  Probiotic Product (PROBIOTIC DAILY PO) Take 1 capsule by mouth daily.   Yes Historical Provider, MD  temazepam (RESTORIL) 30 MG capsule Take 1 capsule (30 mg total) by mouth at bedtime as needed. Patient taking differently: Take 30 mg by mouth at bedtime as needed for sleep.  06/03/10  Yes Ricard Dillon, MD  azithromycin (ZITHROMAX) 250 MG tablet Take 1 tablet (250 mg total) by mouth daily. 1 every day until finished. Patient not taking: Reported on 02/21/2015 02/11/15   Charlesetta Shanks, MD  predniSONE (DELTASONE) 10 MG tablet Take 1 tablet (10 mg total) by mouth daily with breakfast. Patient not taking: Reported on 01/26/2015 07/18/14   Chauncey Cruel, MD   traMADol (ULTRAM) 50 MG tablet Take 1 tablet (50 mg total) by mouth every 6 (six) hours as needed for pain. Patient not taking: Reported on 02/11/2015 07/03/12   Davonna Belling, MD   BP 102/61 mmHg  Pulse 88  Temp(Src) 98.6 F (37 C) (Oral)  Resp 17  Ht 5\' 7"  (1.702 m)  Wt 170 lb (77.111 kg)  BMI 26.62 kg/m2  SpO2 95% Physical Exam  Constitutional: She is oriented to person, place, and time. She appears  well-developed and well-nourished. No distress.  HENT:  Head: Normocephalic and atraumatic.  Eyes: Conjunctivae are normal.  Neck: Neck supple.  Cardiovascular: Normal rate, regular rhythm and normal heart sounds.   Pulmonary/Chest: Effort normal and breath sounds normal.  Abdominal: She exhibits no distension.  Musculoskeletal:  Tenderness to palpation or midline lumbar spine and right SI joint. No hip tenderness. Full range of motion of right hip. No pain with flexion, internal or external rotation. Diffuse tenderness of her right knee, with full range of motion with no pain. Joint stable. Contusion to the right tib-fib, tender to palpation. Ankle appears to be normal with no tenderness or pain range of motion. Foot however appears to be swollen, diffusely tender. Dorsal pedal pulse intact.  Neurological: She is alert and oriented to person, place, and time.  Skin: Skin is warm and dry.  Psychiatric: She has a normal mood and affect.  Nursing note and vitals reviewed.   ED Course  Procedures  DIAGNOSTIC STUDIES:    Oxygen Saturation is 95% on RA, adequate by my interpretation.   COORDINATION OF CARE:  8:05 PM Will order xrays. Discussed treatment plan with pt at bedside and pt agreed to plan.   Labs Review Labs Reviewed - No data to display  Imaging Review Dg Lumbar Spine Complete  02/21/2015  CLINICAL DATA:  Hip, knee and back pain.  Multiple recent falls. EXAM: RIGHT FOOT COMPLETE - 3+ VIEW; RIGHT TIBIA AND FIBULA - 2 VIEW; LUMBAR SPINE - COMPLETE 4+ VIEW COMPARISON:   None. FINDINGS: Lumbar spine: Left convex lumbar scoliosis with advanced degenerative lumbar spondylosis. Multilevel disc disease and facet disease but no acute bony findings or destructive bony changes. No obvious pars defects. The visualized bony pelvis is intact. Aortic calcifications are noted. Bilateral hip prostheses are noted. Right tibia/ fibula: The right knee prosthesis appears intact. No complicating features. No acute bony findings involving the tibia or fibula. Right foot: Remote surgical changes involving the third metatarsal distal shaft/ neck region. There are moderate degenerative changes involving the foot. Remote appearing fusion at the PIP joint of the third toe. Remote trauma involving the fifth metatarsal. No acute fracture. No destructive bony changes. Moderate pes cavus. Small calcaneal heel spur. IMPRESSION: 1. Advanced degenerative lumbar spondylosis with multilevel disc disease and facet disease but no acute findings. 2. No right tibial or fibular fracture. 3. Remote surgical changes and probable posttraumatic changes involving the right foot but no acute bony findings. Electronically Signed   By: Marijo Sanes M.D.   On: 02/21/2015 21:11   Dg Tibia/fibula Right  02/21/2015  CLINICAL DATA:  Hip, knee and back pain.  Multiple recent falls. EXAM: RIGHT FOOT COMPLETE - 3+ VIEW; RIGHT TIBIA AND FIBULA - 2 VIEW; LUMBAR SPINE - COMPLETE 4+ VIEW COMPARISON:  None. FINDINGS: Lumbar spine: Left convex lumbar scoliosis with advanced degenerative lumbar spondylosis. Multilevel disc disease and facet disease but no acute bony findings or destructive bony changes. No obvious pars defects. The visualized bony pelvis is intact. Aortic calcifications are noted. Bilateral hip prostheses are noted. Right tibia/ fibula: The right knee prosthesis appears intact. No complicating features. No acute bony findings involving the tibia or fibula. Right foot: Remote surgical changes involving the third metatarsal  distal shaft/ neck region. There are moderate degenerative changes involving the foot. Remote appearing fusion at the PIP joint of the third toe. Remote trauma involving the fifth metatarsal. No acute fracture. No destructive bony changes. Moderate pes cavus. Small calcaneal heel spur.  IMPRESSION: 1. Advanced degenerative lumbar spondylosis with multilevel disc disease and facet disease but no acute findings. 2. No right tibial or fibular fracture. 3. Remote surgical changes and probable posttraumatic changes involving the right foot but no acute bony findings. Electronically Signed   By: Marijo Sanes M.D.   On: 02/21/2015 21:11   Dg Ankle Complete Right  02/21/2015  CLINICAL DATA:  RIGHT ankle pain questionably from prior fall, redness and swelling RIGHT ankle for at least a month, unable to ambulate on RIGHT foot/ankle, multiple recent falls EXAM: RIGHT ANKLE - COMPLETE 3+ VIEW COMPARISON:  RIGHT foot radiographs 07/03/2012 FINDINGS: Calcaneus incompletely visualized. Osseous demineralization. Diffuse soft tissue swelling. Ankle mortise intact. No definite acute fracture, dislocation, or bone destruction. Scattered small vessel vascular calcification. IMPRESSION: Osseous demineralization and soft tissue swelling without definite acute bony abnormalities. Incomplete visualization of the calcaneus; if patient's symptoms could be related to tarsal bones or foot, recommend dedicated RIGHT foot radiographs for further assessment. Electronically Signed   By: Lavonia Dana M.D.   On: 02/21/2015 17:55   Dg Foot Complete Right  02/21/2015  CLINICAL DATA:  Hip, knee and back pain.  Multiple recent falls. EXAM: RIGHT FOOT COMPLETE - 3+ VIEW; RIGHT TIBIA AND FIBULA - 2 VIEW; LUMBAR SPINE - COMPLETE 4+ VIEW COMPARISON:  None. FINDINGS: Lumbar spine: Left convex lumbar scoliosis with advanced degenerative lumbar spondylosis. Multilevel disc disease and facet disease but no acute bony findings or destructive bony changes. No  obvious pars defects. The visualized bony pelvis is intact. Aortic calcifications are noted. Bilateral hip prostheses are noted. Right tibia/ fibula: The right knee prosthesis appears intact. No complicating features. No acute bony findings involving the tibia or fibula. Right foot: Remote surgical changes involving the third metatarsal distal shaft/ neck region. There are moderate degenerative changes involving the foot. Remote appearing fusion at the PIP joint of the third toe. Remote trauma involving the fifth metatarsal. No acute fracture. No destructive bony changes. Moderate pes cavus. Small calcaneal heel spur. IMPRESSION: 1. Advanced degenerative lumbar spondylosis with multilevel disc disease and facet disease but no acute findings. 2. No right tibial or fibular fracture. 3. Remote surgical changes and probable posttraumatic changes involving the right foot but no acute bony findings. Electronically Signed   By: Marijo Sanes M.D.   On: 02/21/2015 21:11   I have personally reviewed and evaluated these images and lab results as part of my medical decision-making.   EKG Interpretation None      MDM   Final diagnoses:  Right foot pain  Falls, initial encounter  Right hip pain    patient with multiple joint replacements, known arthritis of the back and hips, knees, feet, presents with worsening pain to the right leg. Patient apparently has had multiple falls in the last 3 weeks and has been seen in emergency department and by her primary care doctor multiple times for the same. Is diagnosed. In the last 3 weeks, patient stopped walking, now only ambulating via wheelchair. Patient was at [facility] rehab, however now lives with her husband at independent living facility in [place]. Patient does have some nursing help, but daughter believes is insufficient which is making her fall. We'll check some x-rays, patient appears to be in a lot of pain, she did not take her Vicodin that she normally  takes, will order it here   Patient's x-rays all unremarkable other than advanced arthritis. She has an appointment with her primary care doctor tomorrow morning. Dr. Herb Grays  voiced concern to [place] about patient going back to her home. She asked if she can be placed back to the [facility] facility. I discussed with social worker who was unable to arrange it at this evening.  Patient will be discharged back. She has an appointment with her primary care doctor tomorrow morning. This will be addressed there. Filed Vitals:   02/21/15 1658 02/21/15 2101  BP: 102/61 113/74  Pulse: 88 78  Temp: 98.6 F (37 C) 98.3 F (36.8 C)  TempSrc: Oral Oral  Resp: 17 18  Height: 5\' 7"  (1.702 m)   Weight: 77.111 kg   SpO2: 95% 94%     Jeannett Senior, PA-C 02/22/15 0415  April Palumbo, MD 02/22/15 [phone]

## 2015-04-16 ENCOUNTER — Ambulatory Visit: Payer: Medicare Other | Admitting: Podiatry

## 2015-07-03 NOTE — Patient Instructions (Addendum)
[name]  07/03/2015   Your procedure is scheduled on: 07/10/15 [facility]  Report to [facility] Main  Entrance take [facility]  elevators to 3rd floor to  [place] at  1:30 PM.  Call this number if you have problems the morning of surgery [phone]   Remember: ONLY 1 PERSON MAY GO WITH YOU TO SHORT STAY TO GET  READY MORNING OF [place].  Do not eat food :After Midnight.TUESDAY NIGHT--- MAY HAVE CLEAR LIQUIDS Wednesday AM UNTIL 0930 AM--THEN NOTHING BY MOUTH  PLWEASE BRING PRO AIR INHALER WITH YOU TO HOSPITAL   Take these medicines the morning of surgery with A SIP OF WATER: BUPROPION, LEXAPRO, GABAPENTIN,       DULERA INHAL., FLONASE MAY TAKE HYDROCODONE, TRAMADOL, VALIUM if needed or use pro air inhaler DO NOT TAKE ANY DIABETIC MEDICATIONS DAY OF YOUR SURGERY                               You may not have any metal on your body including hair pins and              piercings  Do not wear jewelry, make-up, lotions, powders or perfumes, deodorant             Do not wear nail polish.  Do not shave  48 hours prior to surgery.              Men may shave face and neck.   Do not bring valuables to the hospital. [place].  Contacts, dentures or bridgework may not be worn into surgery.  Leave suitcase in the car. After surgery it may be brought to your room.                Please read over the following fact sheets you were given: _____________________________________________________________________             [facility] - Preparing for Surgery Before surgery, you can play an important role.  Because skin is not sterile, your skin needs to be as free of germs as possible.  You can reduce the number of germs on your skin by washing with CHG (chlorahexidine gluconate) soap before surgery.  CHG is an antiseptic cleaner which kills germs and bonds with the skin to continue killing germs even after  washing. Please DO NOT use if you have an allergy to CHG or antibacterial soaps.  If your skin becomes reddened/irritated stop using the CHG and inform your nurse when you arrive at Short Stay. Do not shave (including legs and underarms) for at least 48 hours prior to the first CHG shower.  You may shave your face/neck. Please follow these instructions carefully:  1.  Shower with CHG Soap the night before surgery and the  morning of Surgery.  2.  If you choose to wash your hair, wash your hair first as usual with your  normal  shampoo.  3.  After you shampoo, rinse your hair and body thoroughly to remove the  shampoo.                           4.  Use CHG as you would any other  liquid soap.  You can apply chg directly  to the skin and wash                       Gently with a scrungie or clean washcloth.  5.  Apply the CHG Soap to your body ONLY FROM THE NECK DOWN.   Do not use on face/ open                           Wound or open sores. Avoid contact with eyes, ears mouth and genitals (private parts).                       Wash face,  Genitals (private parts) with your normal soap.             6.  Wash thoroughly, paying special attention to the area where your surgery  will be performed.  7.  Thoroughly rinse your body with warm water from the neck down.  8.  DO NOT shower/wash with your normal soap after using and rinsing off  the CHG Soap.                9.  Pat yourself dry with a clean towel.            10.  Wear clean pajamas.            11.  Place clean sheets on your bed the night of your first shower and do not  sleep with pets. Day of Surgery : Do not apply any lotions/deodorants the morning of surgery.  Please wear clean clothes to the hospital/surgery center.  FAILURE TO FOLLOW THESE INSTRUCTIONS MAY RESULT IN THE CANCELLATION OF YOUR SURGERY PATIENT SIGNATURE_________________________________  NURSE  SIGNATURE__________________________________  ________________________________________________________________________  WHAT IS A BLOOD TRANSFUSION? Blood Transfusion Information  A transfusion is the replacement of blood or some of its parts. Blood is made up of multiple cells which provide different functions.  Red blood cells carry oxygen and are used for blood loss replacement.  White blood cells fight against infection.  Platelets control bleeding.  Plasma helps clot blood.  Other blood products are available for specialized needs, such as hemophilia or other clotting disorders. BEFORE THE TRANSFUSION  Who gives blood for transfusions?   Healthy volunteers who are fully evaluated to make sure their blood is safe. This is blood bank blood. Transfusion therapy is the safest it has ever been in the practice of medicine. Before blood is taken from a donor, a complete history is taken to make sure that person has no history of diseases nor engages in risky social behavior (examples are intravenous drug use or sexual activity with multiple partners). The donor's travel history is screened to minimize risk of transmitting infections, such as malaria. The donated blood is tested for signs of infectious diseases, such as HIV and hepatitis. The blood is then tested to be sure it is compatible with you in order to minimize the chance of a transfusion reaction. If you or a relative donates blood, this is often done in anticipation of surgery and is not appropriate for emergency situations. It takes many days to process the donated blood. RISKS AND COMPLICATIONS Although transfusion therapy is very safe and saves many lives, the main dangers of transfusion include:  1. Getting an infectious disease. 2. Developing a transfusion reaction. This is an allergic reaction to something in the blood  you were given. Every precaution is taken to prevent this. The decision to have a blood transfusion has been  considered carefully by your caregiver before blood is given. Blood is not given unless the benefits outweigh the risks. AFTER THE TRANSFUSION  Right after receiving a blood transfusion, you will usually feel much better and more energetic. This is especially true if your red blood cells have gotten low (anemic). The transfusion raises the level of the red blood cells which carry oxygen, and this usually causes an energy increase.  The nurse administering the transfusion will monitor you carefully for complications. HOME CARE INSTRUCTIONS  No special instructions are needed after a transfusion. You may find your energy is better. Speak with your caregiver about any limitations on activity for underlying diseases you may have. SEEK MEDICAL CARE IF:   Your condition is not improving after your transfusion.  You develop redness or irritation at the intravenous (IV) site. SEEK IMMEDIATE MEDICAL CARE IF:  Any of the following symptoms occur over the next 12 hours:  Shaking chills.  You have a temperature by mouth above 102 F (38.9 C), not controlled by medicine.  Chest, back, or muscle pain.  People around you feel you are not acting correctly or are confused.  Shortness of breath or difficulty breathing.  Dizziness and fainting.  You get a rash or develop hives.  You have a decrease in urine output.  Your urine turns a dark color or changes to pink, red, or brown. Any of the following symptoms occur over the next 10 days:  You have a temperature by mouth above 102 F (38.9 C), not controlled by medicine.  Shortness of breath.  Weakness after normal activity.  The white part of the eye turns yellow (jaundice).  You have a decrease in the amount of urine or are urinating less often.  Your urine turns a dark color or changes to pink, red, or brown. Document Released: 01/31/2000 Document Revised: 04/27/2011 Document Reviewed: 09/19/2007 ExitCare Patient Information 2014  [employer], [place].  _______________________________________________________________________   CLEAR LIQUID DIET   Foods Allowed                                                                     Foods Excluded  Coffee and tea, regular and decaf                             liquids that you cannot  Plain Jell-O in any flavor                                             see through such as: Fruit ices (not with fruit pulp)                                     milk, soups, orange juice  Iced Popsicles  All solid food Carbonated beverages, regular and diet                                    Cranberry, grape and apple juices Sports drinks like Gatorade Lightly seasoned clear broth or consume(fat free) Sugar, honey syrup  Sample Menu Breakfast                                Lunch                                     Supper Cranberry juice                    Beef broth                            Chicken broth Jell-O                                     Grape juice                           Apple juice Coffee or tea                        Jell-O                                      Popsicle                                                Coffee or tea                        Coffee or tea  _____________________________________________________________________    Incentive Spirometer  An incentive spirometer is a tool that can help keep your lungs clear and active. This tool measures how well you are filling your lungs with each breath. Taking long deep breaths may help reverse or decrease the chance of developing breathing (pulmonary) problems (especially infection) following:  A long period of time when you are unable to move or be active. BEFORE THE PROCEDURE   If the spirometer includes an indicator to show your best effort, your nurse or respiratory therapist will set it to a desired goal.  If possible, sit up straight or lean slightly forward. Try not to  slouch.  Hold the incentive spirometer in an upright position. INSTRUCTIONS FOR USE  3. Sit on the edge of your bed if possible, or sit up as far as you can in bed or on a chair. 4. Hold the incentive spirometer in an upright position. 5. Breathe out normally. 6. Place the mouthpiece in your mouth and seal your lips tightly around it. 7. Breathe in slowly and as deeply as possible, raising the piston or the ball toward the top of the column. 8. Hold your breath for 3-5 seconds  or for as long as possible. Allow the piston or ball to fall to the bottom of the column. 9. Remove the mouthpiece from your mouth and breathe out normally. 10. Rest for a few seconds and repeat Steps 1 through 7 at least 10 times every 1-2 hours when you are awake. Take your time and take a few normal breaths between deep breaths. 11. The spirometer may include an indicator to show your best effort. Use the indicator as a goal to work toward during each repetition. 12. After each set of 10 deep breaths, practice coughing to be sure your lungs are clear. If you have an incision (the cut made at the time of surgery), support your incision when coughing by placing a pillow or rolled up towels firmly against it. Once you are able to get out of bed, walk around indoors and cough well. You may stop using the incentive spirometer when instructed by your caregiver.  RISKS AND COMPLICATIONS  Take your time so you do not get dizzy or light-headed.  If you are in pain, you may need to take or ask for pain medication before doing incentive spirometry. It is harder to take a deep breath if you are having pain. AFTER USE  Rest and breathe slowly and easily.  It can be helpful to keep track of a log of your progress. Your caregiver can provide you with a simple table to help with this. If you are using the spirometer at home, follow these instructions: [place] IF:   You are having difficultly using the  spirometer.  You have trouble using the spirometer as often as instructed.  Your pain medication is not giving enough relief while using the spirometer.  You develop fever of 100.5 F (38.1 C) or higher. SEEK IMMEDIATE MEDICAL CARE IF:   You cough up bloody sputum that had not been present before.  You develop fever of 102 F (38.9 C) or greater.  You develop worsening pain at or near the incision site. MAKE SURE YOU:   Understand these instructions.  Will watch your condition.  Will get help right away if you are not doing well or get worse. Document Released: 06/15/2006 Document Revised: 04/27/2011 Document Reviewed: 08/16/2006 [facility] Patient Information 2014 [place], [place].   ________________________________________________________________________

## 2015-07-04 ENCOUNTER — Ambulatory Visit (HOSPITAL_COMMUNITY)
Admission: RE | Admit: 2015-07-04 | Discharge: 2015-07-04 | Disposition: A | Discharge status: Home / Self Care | Payer: Medicare Other | Source: Ambulatory Visit | Attending: Anesthesiology | Admitting: Anesthesiology

## 2015-07-04 ENCOUNTER — Encounter (HOSPITAL_COMMUNITY): Payer: Self-pay

## 2015-07-04 ENCOUNTER — Encounter (HOSPITAL_COMMUNITY)
Admission: RE | Admit: 2015-07-04 | Discharge: 2015-07-04 | Disposition: A | Discharge status: Home / Self Care | Payer: Medicare Other | Source: Ambulatory Visit | Attending: Orthopedic Surgery | Admitting: Orthopedic Surgery

## 2015-07-04 ENCOUNTER — Ambulatory Visit: Payer: Self-pay | Admitting: Orthopedic Surgery

## 2015-07-04 DIAGNOSIS — R9389 Abnormal findings on diagnostic imaging of other specified body structures: Secondary | ICD-10-CM

## 2015-07-04 DIAGNOSIS — I10 Essential (primary) hypertension: Secondary | ICD-10-CM | POA: Diagnosis not present

## 2015-07-04 DIAGNOSIS — R938 Abnormal findings on diagnostic imaging of other specified body structures: Secondary | ICD-10-CM | POA: Insufficient documentation

## 2015-07-04 HISTORY — DX: Urinary tract infection, site not specified: N39.0

## 2015-07-04 LAB — DIFFERENTIAL
BASOS ABS: 0 10*3/uL (ref 0.0–0.1)
BASOS PCT: 1 %
EOS ABS: 0.2 10*3/uL (ref 0.0–0.7)
EOS PCT: 3 %
Lymphocytes Relative: 23 %
Lymphs Abs: 1.4 10*3/uL (ref 0.7–4.0)
Monocytes Absolute: 0.7 10*3/uL (ref 0.1–1.0)
Monocytes Relative: 12 %
NEUTROS PCT: 61 %
Neutro Abs: 3.8 10*3/uL (ref 1.7–7.7)

## 2015-07-04 LAB — PROTIME-INR
INR: 1.07 (ref 0.00–1.49)
PROTHROMBIN TIME: 13.7 s (ref 11.6–15.2)

## 2015-07-04 LAB — CBC
HCT: 40.7 % (ref 36.0–46.0)
Hemoglobin: 13.5 g/dL (ref 12.0–15.0)
MCH: 31.6 pg (ref 26.0–34.0)
MCHC: 33.2 g/dL (ref 30.0–36.0)
MCV: 95.3 fL (ref 78.0–100.0)
Platelets: 271 10*3/uL (ref 150–400)
RBC: 4.27 MIL/uL (ref 3.87–5.11)
RDW: 13.8 % (ref 11.5–15.5)
WBC: 6.1 10*3/uL (ref 4.0–10.5)

## 2015-07-04 LAB — SURGICAL PCR SCREEN
MRSA, PCR: NEGATIVE
STAPHYLOCOCCUS AUREUS: NEGATIVE

## 2015-07-04 LAB — COMPREHENSIVE METABOLIC PANEL
ALBUMIN: 4.3 g/dL (ref 3.5–5.0)
ALT: 20 U/L (ref 14–54)
ANION GAP: 8 (ref 5–15)
AST: 25 U/L (ref 15–41)
Alkaline Phosphatase: 161 U/L — ABNORMAL HIGH (ref 38–126)
BUN: 15 mg/dL (ref 6–20)
CHLORIDE: 101 mmol/L (ref 101–111)
CO2: 28 mmol/L (ref 22–32)
Calcium: 9.7 mg/dL (ref 8.9–10.3)
Creatinine, Ser: 0.68 mg/dL (ref 0.44–1.00)
GFR calc Af Amer: >60 mL/min (ref >60)
GLUCOSE: 95 mg/dL (ref 65–99)
POTASSIUM: 5.2 mmol/L — AB (ref 3.5–5.1)
Sodium: 137 mmol/L (ref 135–145)
TOTAL PROTEIN: 6.9 g/dL (ref 6.5–8.1)
Total Bilirubin: 0.4 mg/dL (ref 0.3–1.2)

## 2015-07-04 LAB — APTT: APTT: 31 s (ref 24–37)

## 2015-07-04 NOTE — Progress Notes (Signed)
Pt c/o some dysuria at times.  Attempted again to obtain urine at PST visit after many cups water and after second attempt she voided not enough to be sent for urinalysis.  After being in hospital over 2 hours, she stated she was not going to try again. Note on chart to be obtained am of surgery.  Copied and faxed this note to Dr Wynelle Link via epic

## 2015-07-04 NOTE — Progress Notes (Signed)
Preoperative surgical orders have been place into the Epic hospital system for [name] on 07/04/2015, 2:10 PM  by Mickel Crow for surgery on 07-10-15.  Preop revision Total Hip orders including IV Tylenol, and IV Decadron as long as there are no contraindications to the above medications. Arlee Muslim, PA-C

## 2015-07-04 NOTE — Progress Notes (Signed)
lov NP at Dr Jill Poling office 4/17 on chart.  Patient states has some burning with urination that is new.  Attempted to collect urine at 1430- unable to void.  Given water to drink

## 2015-07-05 LAB — HEMOGLOBIN A1C
Hgb A1c MFr Bld: 5.9 % — ABNORMAL HIGH (ref 4.8–5.6)
MEAN PLASMA GLUCOSE: 123 mg/dL

## 2015-07-10 ENCOUNTER — Inpatient Hospital Stay (HOSPITAL_COMMUNITY): Payer: Medicare Other

## 2015-07-10 ENCOUNTER — Encounter (HOSPITAL_COMMUNITY): Payer: Self-pay

## 2015-07-10 ENCOUNTER — Inpatient Hospital Stay (HOSPITAL_COMMUNITY): Payer: Medicare Other | Admitting: Certified Registered Nurse Anesthetist

## 2015-07-10 ENCOUNTER — Encounter (HOSPITAL_COMMUNITY)
Admission: AD | Disposition: A | Discharge status: Skilled Nursing Facility | Payer: Self-pay | Source: Ambulatory Visit | Attending: Orthopedic Surgery

## 2015-07-10 ENCOUNTER — Inpatient Hospital Stay (HOSPITAL_COMMUNITY)
Admission: AD | Admit: 2015-07-10 | Discharge: 2015-07-12 | DRG: 468 | Disposition: A | Discharge status: Skilled Nursing Facility | Payer: Medicare Other | Source: Ambulatory Visit | Attending: Orthopedic Surgery | Admitting: Orthopedic Surgery

## 2015-07-10 DIAGNOSIS — J45909 Unspecified asthma, uncomplicated: Secondary | ICD-10-CM | POA: Diagnosis present

## 2015-07-10 DIAGNOSIS — M8580 Other specified disorders of bone density and structure, unspecified site: Secondary | ICD-10-CM | POA: Diagnosis present

## 2015-07-10 DIAGNOSIS — T84018A Broken internal joint prosthesis, other site, initial encounter: Secondary | ICD-10-CM

## 2015-07-10 DIAGNOSIS — Z8262 Family history of osteoporosis: Secondary | ICD-10-CM | POA: Diagnosis not present

## 2015-07-10 DIAGNOSIS — Z8261 Family history of arthritis: Secondary | ICD-10-CM | POA: Diagnosis not present

## 2015-07-10 DIAGNOSIS — I10 Essential (primary) hypertension: Secondary | ICD-10-CM | POA: Diagnosis present

## 2015-07-10 DIAGNOSIS — M1990 Unspecified osteoarthritis, unspecified site: Secondary | ICD-10-CM | POA: Diagnosis present

## 2015-07-10 DIAGNOSIS — Z79899 Other long term (current) drug therapy: Secondary | ICD-10-CM

## 2015-07-10 DIAGNOSIS — Y792 Prosthetic and other implants, materials and accessory orthopedic devices associated with adverse incidents: Secondary | ICD-10-CM | POA: Diagnosis present

## 2015-07-10 DIAGNOSIS — Z87891 Personal history of nicotine dependence: Secondary | ICD-10-CM | POA: Diagnosis not present

## 2015-07-10 DIAGNOSIS — Z981 Arthrodesis status: Secondary | ICD-10-CM

## 2015-07-10 DIAGNOSIS — Z8249 Family history of ischemic heart disease and other diseases of the circulatory system: Secondary | ICD-10-CM | POA: Diagnosis not present

## 2015-07-10 DIAGNOSIS — Z7982 Long term (current) use of aspirin: Secondary | ICD-10-CM | POA: Diagnosis not present

## 2015-07-10 DIAGNOSIS — E119 Type 2 diabetes mellitus without complications: Secondary | ICD-10-CM | POA: Diagnosis present

## 2015-07-10 DIAGNOSIS — Z96649 Presence of unspecified artificial hip joint: Secondary | ICD-10-CM

## 2015-07-10 DIAGNOSIS — M4806 Spinal stenosis, lumbar region: Secondary | ICD-10-CM | POA: Diagnosis present

## 2015-07-10 DIAGNOSIS — Z833 Family history of diabetes mellitus: Secondary | ICD-10-CM

## 2015-07-10 DIAGNOSIS — T84030A Mechanical loosening of internal right hip prosthetic joint, initial encounter: Principal | ICD-10-CM | POA: Diagnosis present

## 2015-07-10 DIAGNOSIS — Z90710 Acquired absence of both cervix and uterus: Secondary | ICD-10-CM | POA: Diagnosis not present

## 2015-07-10 DIAGNOSIS — Z853 Personal history of malignant neoplasm of breast: Secondary | ICD-10-CM

## 2015-07-10 DIAGNOSIS — M25551 Pain in right hip: Secondary | ICD-10-CM | POA: Diagnosis present

## 2015-07-10 HISTORY — PX: TOTAL HIP REVISION: SHX763

## 2015-07-10 LAB — URINALYSIS, ROUTINE W REFLEX MICROSCOPIC
BILIRUBIN URINE: NEGATIVE
GLUCOSE, UA: NEGATIVE mg/dL
HGB URINE DIPSTICK: NEGATIVE
KETONES UR: NEGATIVE mg/dL
Leukocytes, UA: NEGATIVE
Nitrite: NEGATIVE
PH: 7.5 (ref 5.0–8.0)
PROTEIN: NEGATIVE mg/dL
Specific Gravity, Urine: 1.007 (ref 1.005–1.030)

## 2015-07-10 LAB — TYPE AND SCREEN
ABO/RH(D): B NEG
ANTIBODY SCREEN: NEGATIVE

## 2015-07-10 SURGERY — TOTAL HIP REVISION
Anesthesia: Spinal | Site: Hip | Laterality: Right

## 2015-07-10 MED ORDER — DEXAMETHASONE SODIUM PHOSPHATE 10 MG/ML IJ SOLN
INTRAMUSCULAR | Status: AC
  Filled 2015-07-10: qty 1

## 2015-07-10 MED ORDER — ACETAMINOPHEN 10 MG/ML IV SOLN
1000.0000 mg | Freq: Once | INTRAVENOUS | Status: AC
  Administered 2015-07-10: 1000 mg via INTRAVENOUS
  Filled 2015-07-10: qty 100

## 2015-07-10 MED ORDER — ALBUTEROL SULFATE (2.5 MG/3ML) 0.083% IN NEBU
INHALATION_SOLUTION | RESPIRATORY_TRACT | Status: AC
  Filled 2015-07-10: qty 3

## 2015-07-10 MED ORDER — BUPROPION HCL ER (XL) 300 MG PO TB24
300.0000 mg | ORAL_TABLET | Freq: Every day | ORAL | Status: DC
  Administered 2015-07-11 – 2015-07-12 (×2): 300 mg via ORAL
  Filled 2015-07-10 (×2): qty 1

## 2015-07-10 MED ORDER — MEPERIDINE HCL 50 MG/ML IJ SOLN
INTRAMUSCULAR | Status: AC
  Filled 2015-07-10: qty 1

## 2015-07-10 MED ORDER — GABAPENTIN 100 MG PO CAPS
100.0000 mg | ORAL_CAPSULE | Freq: Two times a day (BID) | ORAL | Status: DC
  Administered 2015-07-10 – 2015-07-12 (×4): 100 mg via ORAL
  Filled 2015-07-10 (×4): qty 1

## 2015-07-10 MED ORDER — PHENOL 1.4 % MT LIQD
1.0000 | OROMUCOSAL | Status: DC | PRN
  Filled 2015-07-10: qty 177

## 2015-07-10 MED ORDER — ONDANSETRON HCL 4 MG/2ML IJ SOLN
INTRAMUSCULAR | Status: DC | PRN
  Administered 2015-07-10 (×2): 2 mg via INTRAVENOUS

## 2015-07-10 MED ORDER — IRBESARTAN 150 MG PO TABS
150.0000 mg | ORAL_TABLET | Freq: Every day | ORAL | Status: DC
  Filled 2015-07-10 (×2): qty 1

## 2015-07-10 MED ORDER — FENTANYL CITRATE (PF) 100 MCG/2ML IJ SOLN
INTRAMUSCULAR | Status: AC
  Filled 2015-07-10: qty 2

## 2015-07-10 MED ORDER — ONDANSETRON HCL 4 MG/2ML IJ SOLN: 4.0000 mg | Freq: Once | INTRAMUSCULAR | Status: DC | PRN

## 2015-07-10 MED ORDER — FLEET ENEMA 7-19 GM/118ML RE ENEM: 1.0000 | ENEMA | Freq: Once | RECTAL | Status: DC | PRN

## 2015-07-10 MED ORDER — DEXAMETHASONE SODIUM PHOSPHATE 10 MG/ML IJ SOLN
10.0000 mg | Freq: Once | INTRAMUSCULAR | Status: AC
  Administered 2015-07-10: 10 mg via INTRAVENOUS

## 2015-07-10 MED ORDER — DEXAMETHASONE SODIUM PHOSPHATE 10 MG/ML IJ SOLN
10.0000 mg | Freq: Once | INTRAMUSCULAR | Status: AC
  Administered 2015-07-11: 10 mg via INTRAVENOUS
  Filled 2015-07-10: qty 1

## 2015-07-10 MED ORDER — BUPIVACAINE HCL 0.25 % IJ SOLN
INTRAMUSCULAR | Status: DC | PRN
  Administered 2015-07-10: 30 mL

## 2015-07-10 MED ORDER — SODIUM CHLORIDE 0.9 % IV SOLN
INTRAVENOUS | Status: DC
  Administered 2015-07-10 – 2015-07-11 (×2): via INTRAVENOUS

## 2015-07-10 MED ORDER — CEFAZOLIN SODIUM-DEXTROSE 2-4 GM/100ML-% IV SOLN
INTRAVENOUS | Status: AC
  Filled 2015-07-10: qty 100

## 2015-07-10 MED ORDER — PROPOFOL 10 MG/ML IV BOLUS
INTRAVENOUS | Status: AC
  Filled 2015-07-10: qty 40

## 2015-07-10 MED ORDER — SODIUM CHLORIDE 0.9 % IJ SOLN
INTRAMUSCULAR | Status: AC
  Filled 2015-07-10: qty 50

## 2015-07-10 MED ORDER — POLYETHYLENE GLYCOL 3350 17 G PO PACK: 17.0000 g | PACK | Freq: Every day | ORAL | Status: DC | PRN

## 2015-07-10 MED ORDER — MIDAZOLAM HCL 5 MG/5ML IJ SOLN
INTRAMUSCULAR | Status: DC | PRN
  Administered 2015-07-10: 1 mg via INTRAVENOUS

## 2015-07-10 MED ORDER — MOMETASONE FURO-FORMOTEROL FUM 100-5 MCG/ACT IN AERO
2.0000 | INHALATION_SPRAY | Freq: Two times a day (BID) | RESPIRATORY_TRACT | Status: DC
  Administered 2015-07-10 – 2015-07-11 (×3): 2 via RESPIRATORY_TRACT
  Filled 2015-07-10: qty 8.8

## 2015-07-10 MED ORDER — BUPIVACAINE LIPOSOME 1.3 % IJ SUSP
20.0000 mL | Freq: Once | INTRAMUSCULAR | Status: DC
  Filled 2015-07-10: qty 20

## 2015-07-10 MED ORDER — RIVAROXABAN 10 MG PO TABS
10.0000 mg | ORAL_TABLET | Freq: Every day | ORAL | Status: DC
  Administered 2015-07-11 – 2015-07-12 (×2): 10 mg via ORAL
  Filled 2015-07-10 (×2): qty 1

## 2015-07-10 MED ORDER — ALBUTEROL SULFATE (2.5 MG/3ML) 0.083% IN NEBU
2.5000 mg | INHALATION_SOLUTION | RESPIRATORY_TRACT | Status: AC
  Administered 2015-07-10: 2.5 mg via RESPIRATORY_TRACT

## 2015-07-10 MED ORDER — HYDROMORPHONE HCL 1 MG/ML IJ SOLN
0.5000 mg | INTRAMUSCULAR | Status: DC | PRN
  Administered 2015-07-10 – 2015-07-11 (×4): 0.5 mg via INTRAVENOUS
  Filled 2015-07-10 (×4): qty 1

## 2015-07-10 MED ORDER — DEXTROSE 5 % IV SOLN
500.0000 mg | Freq: Four times a day (QID) | INTRAVENOUS | Status: DC | PRN
  Administered 2015-07-10: 500 mg via INTRAVENOUS
  Filled 2015-07-10: qty 5
  Filled 2015-07-10: qty 550

## 2015-07-10 MED ORDER — TRANEXAMIC ACID 1000 MG/10ML IV SOLN
2000.0000 mg | Freq: Once | INTRAVENOUS | Status: AC
  Administered 2015-07-10: 2000 mg via TOPICAL
  Filled 2015-07-10: qty 20

## 2015-07-10 MED ORDER — BISACODYL 10 MG RE SUPP: 10.0000 mg | Freq: Every day | RECTAL | Status: DC | PRN

## 2015-07-10 MED ORDER — PROPOFOL 10 MG/ML IV BOLUS
INTRAVENOUS | Status: AC
  Filled 2015-07-10: qty 20

## 2015-07-10 MED ORDER — PROPOFOL 500 MG/50ML IV EMUL
INTRAVENOUS | Status: DC | PRN
Start: 2015-07-10 — End: 2015-07-10
  Administered 2015-07-10: 50 ug/kg/min via INTRAVENOUS

## 2015-07-10 MED ORDER — LACTATED RINGERS IV SOLN
INTRAVENOUS | Status: DC
  Administered 2015-07-10: 20:00:00 via INTRAVENOUS

## 2015-07-10 MED ORDER — MIDAZOLAM HCL 2 MG/2ML IJ SOLN
INTRAMUSCULAR | Status: AC
  Filled 2015-07-10: qty 2

## 2015-07-10 MED ORDER — ACETAMINOPHEN 10 MG/ML IV SOLN
INTRAVENOUS | Status: AC
  Filled 2015-07-10: qty 100

## 2015-07-10 MED ORDER — CEFAZOLIN SODIUM-DEXTROSE 2-4 GM/100ML-% IV SOLN
2.0000 g | INTRAVENOUS | Status: AC
Start: 2015-07-11 — End: 2015-07-10
  Administered 2015-07-10: 2 g via INTRAVENOUS

## 2015-07-10 MED ORDER — DOCUSATE SODIUM 100 MG PO CAPS
100.0000 mg | ORAL_CAPSULE | Freq: Two times a day (BID) | ORAL | Status: DC
  Administered 2015-07-11 – 2015-07-12 (×3): 100 mg via ORAL
  Filled 2015-07-10 (×3): qty 1

## 2015-07-10 MED ORDER — FLUTICASONE PROPIONATE 50 MCG/ACT NA SUSP
1.0000 | Freq: Two times a day (BID) | NASAL | Status: DC
  Administered 2015-07-10 – 2015-07-12 (×4): 1 via NASAL
  Filled 2015-07-10: qty 16

## 2015-07-10 MED ORDER — LACTATED RINGERS IV SOLN
INTRAVENOUS | Status: DC | PRN
  Administered 2015-07-10 (×2): via INTRAVENOUS

## 2015-07-10 MED ORDER — ACETAMINOPHEN 650 MG RE SUPP: 650.0000 mg | Freq: Four times a day (QID) | RECTAL | Status: DC | PRN

## 2015-07-10 MED ORDER — FENTANYL CITRATE (PF) 100 MCG/2ML IJ SOLN
INTRAMUSCULAR | Status: DC | PRN
  Administered 2015-07-10 (×4): 25 ug via INTRAVENOUS

## 2015-07-10 MED ORDER — DIAZEPAM 5 MG PO TABS
10.0000 mg | ORAL_TABLET | Freq: Four times a day (QID) | ORAL | Status: DC | PRN
  Administered 2015-07-11: 10 mg via ORAL
  Administered 2015-07-12: 5 mg via ORAL
  Filled 2015-07-10 (×2): qty 2

## 2015-07-10 MED ORDER — METHOCARBAMOL 500 MG PO TABS
500.0000 mg | ORAL_TABLET | Freq: Four times a day (QID) | ORAL | Status: DC | PRN
  Administered 2015-07-11 – 2015-07-12 (×3): 500 mg via ORAL
  Filled 2015-07-10 (×3): qty 1

## 2015-07-10 MED ORDER — BUPIVACAINE HCL (PF) 0.25 % IJ SOLN
INTRAMUSCULAR | Status: AC
  Filled 2015-07-10: qty 30

## 2015-07-10 MED ORDER — MONTELUKAST SODIUM 10 MG PO TABS
10.0000 mg | ORAL_TABLET | Freq: Every day | ORAL | Status: DC
  Administered 2015-07-10 – 2015-07-11 (×2): 10 mg via ORAL
  Filled 2015-07-10 (×2): qty 1

## 2015-07-10 MED ORDER — BUPIVACAINE HCL (PF) 0.75 % IJ SOLN
INTRAMUSCULAR | Status: DC | PRN
  Administered 2015-07-10: 2 mL via INTRATHECAL

## 2015-07-10 MED ORDER — METOCLOPRAMIDE HCL 5 MG PO TABS: 5.0000 mg | ORAL_TABLET | Freq: Three times a day (TID) | ORAL | Status: DC | PRN

## 2015-07-10 MED ORDER — METOCLOPRAMIDE HCL 5 MG/ML IJ SOLN: 5.0000 mg | Freq: Three times a day (TID) | INTRAMUSCULAR | Status: DC | PRN

## 2015-07-10 MED ORDER — HYDROCODONE-ACETAMINOPHEN 7.5-325 MG PO TABS: 1.0000 | ORAL_TABLET | Freq: Once | ORAL | Status: DC | PRN

## 2015-07-10 MED ORDER — LIDOCAINE HCL (CARDIAC) 20 MG/ML IV SOLN
INTRAVENOUS | Status: AC
  Filled 2015-07-10: qty 5

## 2015-07-10 MED ORDER — ONDANSETRON HCL 4 MG/2ML IJ SOLN: 4.0000 mg | Freq: Four times a day (QID) | INTRAMUSCULAR | Status: DC | PRN

## 2015-07-10 MED ORDER — ONDANSETRON HCL 4 MG/2ML IJ SOLN
INTRAMUSCULAR | Status: AC
  Filled 2015-07-10: qty 2

## 2015-07-10 MED ORDER — OLMESARTAN MEDOXOMIL-HCTZ 20-12.5 MG PO TABS: 1.0000 | ORAL_TABLET | Freq: Every day | ORAL | Status: DC

## 2015-07-10 MED ORDER — ALBUTEROL SULFATE (2.5 MG/3ML) 0.083% IN NEBU: 3.0000 mL | INHALATION_SOLUTION | RESPIRATORY_TRACT | Status: DC | PRN

## 2015-07-10 MED ORDER — SODIUM CHLORIDE 0.9 % IV SOLN: INTRAVENOUS | Status: DC

## 2015-07-10 MED ORDER — MENTHOL 3 MG MT LOZG: 1.0000 | LOZENGE | OROMUCOSAL | Status: DC | PRN

## 2015-07-10 MED ORDER — MEPERIDINE HCL 50 MG/ML IJ SOLN
6.2500 mg | INTRAMUSCULAR | Status: DC | PRN
  Administered 2015-07-10: 6.25 mg via INTRAVENOUS

## 2015-07-10 MED ORDER — CEFAZOLIN SODIUM-DEXTROSE 2-4 GM/100ML-% IV SOLN
2.0000 g | Freq: Four times a day (QID) | INTRAVENOUS | Status: AC
  Administered 2015-07-10 – 2015-07-11 (×2): 2 g via INTRAVENOUS
  Filled 2015-07-10 (×2): qty 100

## 2015-07-10 MED ORDER — FENTANYL CITRATE (PF) 100 MCG/2ML IJ SOLN: 25.0000 ug | INTRAMUSCULAR | Status: DC | PRN

## 2015-07-10 MED ORDER — HYDROCHLOROTHIAZIDE 12.5 MG PO CAPS
12.5000 mg | ORAL_CAPSULE | Freq: Every day | ORAL | Status: DC
  Filled 2015-07-10 (×3): qty 1

## 2015-07-10 MED ORDER — DIPHENHYDRAMINE HCL 12.5 MG/5ML PO ELIX: 12.5000 mg | ORAL_SOLUTION | ORAL | Status: DC | PRN

## 2015-07-10 MED ORDER — ONDANSETRON HCL 4 MG PO TABS: 4.0000 mg | ORAL_TABLET | Freq: Four times a day (QID) | ORAL | Status: DC | PRN

## 2015-07-10 MED ORDER — HYDROCODONE-ACETAMINOPHEN 5-325 MG PO TABS
1.0000 | ORAL_TABLET | ORAL | Status: DC | PRN
  Administered 2015-07-10: 1 via ORAL
  Administered 2015-07-11 (×4): 2 via ORAL
  Filled 2015-07-10 (×4): qty 2
  Filled 2015-07-10: qty 1

## 2015-07-10 MED ORDER — LIDOCAINE HCL (CARDIAC) 20 MG/ML IV SOLN
INTRAVENOUS | Status: DC | PRN
  Administered 2015-07-10: 60 mg via INTRAVENOUS

## 2015-07-10 MED ORDER — ACETAMINOPHEN 325 MG PO TABS
650.0000 mg | ORAL_TABLET | Freq: Four times a day (QID) | ORAL | Status: DC | PRN
  Filled 2015-07-10: qty 2

## 2015-07-10 MED ORDER — ESCITALOPRAM OXALATE 10 MG PO TABS
10.0000 mg | ORAL_TABLET | Freq: Every day | ORAL | Status: DC
  Administered 2015-07-11 – 2015-07-12 (×2): 10 mg via ORAL
  Filled 2015-07-10 (×2): qty 1

## 2015-07-10 MED ORDER — PROPOFOL 10 MG/ML IV BOLUS
INTRAVENOUS | Status: DC | PRN
  Administered 2015-07-10: 30 mg via INTRAVENOUS

## 2015-07-10 MED ORDER — CLONAZEPAM 1 MG PO TABS
1.0000 mg | ORAL_TABLET | Freq: Every day | ORAL | Status: DC | PRN
  Administered 2015-07-10 – 2015-07-11 (×2): 1 mg via ORAL
  Filled 2015-07-10 (×2): qty 1

## 2015-07-10 SURGICAL SUPPLY — 70 items
BAG DECANTER FOR FLEXI CONT (MISCELLANEOUS) ×3 IMPLANT
BAG SPEC THK2 15X12 ZIP CLS (MISCELLANEOUS) ×3
BAG ZIPLOCK 12X15 (MISCELLANEOUS) ×9 IMPLANT
BIT DRILL 2.8X128 (BIT) ×2 IMPLANT
BIT DRILL 2.8X128MM (BIT) ×1
BLADE EXTENDED COATED 6.5IN (ELECTRODE) ×3 IMPLANT
BLADE SAW SAG 73X25 THK (BLADE) ×2
BLADE SAW SGTL 73X25 THK (BLADE) ×1 IMPLANT
BRUSH FEMORAL CANAL (MISCELLANEOUS) IMPLANT
CLOSURE STERI-STRIP 1/4X4 (GAUZE/BANDAGES/DRESSINGS) ×2 IMPLANT
CUP PINNACLE SZ 58MM HIP (Cup) ×2 IMPLANT
DRAPE INCISE IOBAN 66X45 STRL (DRAPES) ×3 IMPLANT
DRAPE ORTHO SPLIT 77X108 STRL (DRAPES) ×6
DRAPE POUCH INSTRU U-SHP 10X18 (DRAPES) ×3 IMPLANT
DRAPE SURG ORHT 6 SPLT 77X108 (DRAPES) ×2 IMPLANT
DRAPE U-SHAPE 47X51 STRL (DRAPES) ×3 IMPLANT
DRSG EMULSION OIL 3X16 NADH (GAUZE/BANDAGES/DRESSINGS) ×3 IMPLANT
DRSG MEPILEX BORDER 4X4 (GAUZE/BANDAGES/DRESSINGS) ×4 IMPLANT
DRSG MEPILEX BORDER 4X8 (GAUZE/BANDAGES/DRESSINGS) ×3 IMPLANT
DURAPREP 26ML APPLICATOR (WOUND CARE) ×3 IMPLANT
ELECT REM PT RETURN 9FT ADLT (ELECTROSURGICAL) ×3
ELECTRODE REM PT RTRN 9FT ADLT (ELECTROSURGICAL) ×1 IMPLANT
EVACUATOR 1/8 PVC DRAIN (DRAIN) ×3 IMPLANT
FACESHIELD WRAPAROUND (MASK) ×12 IMPLANT
FACESHIELD WRAPAROUND OR TEAM (MASK) ×4 IMPLANT
GAUZE SPONGE 4X4 12PLY STRL (GAUZE/BANDAGES/DRESSINGS) ×3 IMPLANT
GLOVE BIO SURGEON STRL SZ7.5 (GLOVE) ×3 IMPLANT
GLOVE BIO SURGEON STRL SZ8 (GLOVE) ×3 IMPLANT
GLOVE BIOGEL PI IND STRL 8 (GLOVE) ×3 IMPLANT
GLOVE BIOGEL PI INDICATOR 8 (GLOVE) ×6
GLOVE SURG SS PI 6.5 STRL IVOR (GLOVE) ×12 IMPLANT
GOWN STRL REUS W/TWL LRG LVL3 (GOWN DISPOSABLE) ×6 IMPLANT
GOWN STRL REUS W/TWL XL LVL3 (GOWN DISPOSABLE) ×3 IMPLANT
HANDPIECE INTERPULSE COAX TIP (DISPOSABLE)
HEAD OSTEO CTAPER L FIT (Orthopedic Implant) ×3 IMPLANT
HEAD OSTEO CTAPER L FIT 32 +5 (Orthopedic Implant) IMPLANT
IMMOBILIZER KNEE 20 (SOFTGOODS)
IMMOBILIZER KNEE 20 THIGH 36 (SOFTGOODS) IMPLANT
LINER MARATHON NEUT +4X52X36 (Hips) ×2 IMPLANT
LINER PINNA ESC CON ACE 32X52 ×2 IMPLANT
MANIFOLD NEPTUNE II (INSTRUMENTS) ×3 IMPLANT
MARKER SKIN DUAL TIP RULER LAB (MISCELLANEOUS) ×3 IMPLANT
NDL SAFETY ECLIPSE 18X1.5 (NEEDLE) ×1 IMPLANT
NEEDLE HYPO 18GX1.5 SHARP (NEEDLE) ×3
PADDING CAST COTTON 6X4 STRL (CAST SUPPLIES) ×3 IMPLANT
PASSER SUT SWANSON 36MM LOOP (INSTRUMENTS) ×3 IMPLANT
POSITIONER SURGICAL ARM (MISCELLANEOUS) ×3 IMPLANT
PRESSURIZER FEMORAL UNIV (MISCELLANEOUS) IMPLANT
SCREW 6.5MMX25MM (Screw) ×4 IMPLANT
SCREW PERIPHERAL BONE SZ 5M (Screw) ×4 IMPLANT
SET HNDPC FAN SPRY TIP SCT (DISPOSABLE) IMPLANT
SPONGE LAP 18X18 X RAY DECT (DISPOSABLE) ×1 IMPLANT
STAPLER VISISTAT 35W (STAPLE) ×3 IMPLANT
SUCTION FRAZIER HANDLE 10FR (MISCELLANEOUS) ×2
SUCTION TUBE FRAZIER 10FR DISP (MISCELLANEOUS) ×1 IMPLANT
SUT ETHIBOND NAB CT1 #1 30IN (SUTURE) ×6 IMPLANT
SUT MNCRL AB 4-0 PS2 18 (SUTURE) ×2 IMPLANT
SUT VIC AB 1 CT1 27 (SUTURE) ×9
SUT VIC AB 1 CT1 27XBRD ANTBC (SUTURE) ×3 IMPLANT
SUT VIC AB 2-0 CT1 27 (SUTURE) ×12
SUT VIC AB 2-0 CT1 TAPERPNT 27 (SUTURE) ×3 IMPLANT
SUT VLOC 180 0 24IN GS25 (SUTURE) ×6 IMPLANT
SWAB COLLECTION DEVICE MRSA (MISCELLANEOUS) ×1 IMPLANT
SWAB CULTURE ESWAB REG 1ML (MISCELLANEOUS) IMPLANT
SYR 50ML LL SCALE MARK (SYRINGE) ×3 IMPLANT
TOWEL OR 17X26 10 PK STRL BLUE (TOWEL DISPOSABLE) ×6 IMPLANT
TOWER CARTRIDGE SMART MIX (DISPOSABLE) IMPLANT
TRAY FOLEY W/METER SILVER 14FR (SET/KITS/TRAYS/PACK) ×5 IMPLANT
TUBE KAMVAC SUCTION (TUBING) IMPLANT
YANKAUER SUCT BULB TIP 10FT TU (MISCELLANEOUS) ×3 IMPLANT

## 2015-07-10 NOTE — Anesthesia Postprocedure Evaluation (Signed)
Anesthesia Post Note  Patient: [name]  Procedure(s) Performed: Procedure(s) (LRB): RIGHT ACETABULAR VS TOTAL HIP REVISION (Right)  Patient location during evaluation: PACU Anesthesia Type: Spinal Level of consciousness: oriented and awake and alert Pain management: pain level controlled Vital Signs Assessment: post-procedure vital signs reviewed and stable Respiratory status: spontaneous breathing, respiratory function stable and patient connected to nasal cannula oxygen Cardiovascular status: blood pressure returned to baseline and stable Postop Assessment: no headache, no backache and spinal receding Anesthetic complications: no    Last Vitals:  Filed Vitals:   07/10/15 1945 07/10/15 2000  BP: 136/73 127/79  Pulse: 78 76  Temp:  36.8 C  Resp: 14 16    Last Pain:  Filed Vitals:   07/10/15 2009  PainSc: 0-No pain    LLE Motor Response: Purposeful movement (07/10/15 2000) LLE Sensation: Numbness;Tingling (07/10/15 2000) RLE Motor Response: Purposeful movement (07/10/15 2000) RLE Sensation: Numbness;Tingling (07/10/15 2000) L Sensory Level: L3-Anterior knee, lower leg (07/10/15 2000) R Sensory Level: L3-Anterior knee, lower leg (07/10/15 2000)  Zenaida Deed

## 2015-07-10 NOTE — H&P (Signed)
[name] is an 82 y.o. female.   Chief Complaint: Right hip pain HPI: [name] is an 82 yo female with a long history of right hip problems who presented to our office 2 weeks ago with hip pain and inability to ambulate after a fall. She had a radiograph which demonstrated loosening and migration of the acetabular component of her right total hip arthroplasty. She presents now for acetabular vs. Total hip revision  Past Medical History  Diagnosis Date  . Asthma   . Depression   . Hypertension   . Arthritis   . Diverticulosis of colon   . History of colon polyps   . Insomnia   . Breast cancer ([place]) 2006    right w/lumpectomy  . Endometrial hyperplasia 01/1996    fibroids, adenomyosis  . Spinal stenosis 2001  . Osteopenia 12/08    hip  . Vitamin D deficiency   . Diabetes ([place]) 2007  . Frequent UTI     Past Surgical History  Procedure Laterality Date  . Cervical fusion    . Cervical laminectomy  1975    x4  . Lumbar laminectomy  10/06    with spinal stenosis  . Cholecystectomy  11/00    lap chole  . Total hip arthroplasty Right 8/00  . Breast lumpectomy Right 03/2003  . Manipulation of hip for dislocation    . Tonsillectomy and adenoidectomy    . Carpal tunnel release    . Tubal ligation Bilateral 1968  . Rotator cuff repair Right 11/96  . Hysteroscopy  10/97    D&C, postmenopausal bleeding  . Total abdominal hysterectomy  12/97    endo hyperplasia, fibroids, adenomyosis  . Bilateral salpingoophorectomy  12/97  . Total knee arthroplasty Left 06/2003  . Total knee arthroplasty Right 10/06    Family History  Problem Relation Age of Onset  . Diabetes Father   . Colon cancer Father 59  . Heart failure Father   . Diabetes Brother   . Diabetes Brother   . Arthritis Mother   . Thyroid disease Mother   . Osteoporosis Mother   . Osteoporosis Maternal Aunt   . Osteoporosis Maternal Grandmother    Social History:  reports that she quit smoking about 25 years  ago. She has never used smokeless tobacco. She reports that she drinks about 2.4 oz of alcohol per week. She reports that she does not use illicit drugs.  Allergies:  Allergies  Allergen Reactions  . Oxycodone Hcl [Oxycodone Hcl] Rash  . Morphine Sulfate Other (See Comments)    REACTION: very anxious  . Sulfamethoxazole Other (See Comments)    REACTION: unspecified    Medications Prior to Admission  Medication Sig Dispense Refill  . albuterol (PROAIR HFA) 108 (90 BASE) MCG/ACT inhaler Inhale 2 puffs into the lungs every 4 (four) hours as needed for shortness of breath.     Ilean Skill Lipoic Acid 200 MG CAPS Take 1 capsule by mouth every evening.    Marland Kitchen aspirin EC 325 MG tablet Take 1 tablet (325 mg total) by mouth daily. 30 tablet 0  . buPROPion (WELLBUTRIN XL) 300 MG 24 hr tablet Take 300 mg by mouth every morning.     . calcium carbonate (OS-CAL - DOSED IN MG OF ELEMENTAL CALCIUM) 1250 (500 Ca) MG tablet Take 1 tablet by mouth daily with breakfast.    . Cholecalciferol (VITAMIN D3) 2000 UNITS TABS Take 2,000 Units by mouth daily with breakfast.     .  clonazePAM (KLONOPIN) 1 MG tablet Take 1 tablet (1 mg total) by mouth daily. (Patient taking differently: Take 1 mg by mouth daily as needed (for sleep). ) 30 tablet 5  . docusate sodium (COLACE) 100 MG capsule Take 100 mg by mouth daily.    . Emollient (DERMEND BRUISE FORMULA) CREA Apply 1 application topically daily. Apply to legs for 6 months  Started 02/14    . escitalopram (LEXAPRO) 10 MG tablet Take 10 mg by mouth daily.     . fluticasone (FLONASE) 50 MCG/ACT nasal spray Place 1 spray into both nostrils 2 (two) times daily.    Marland Kitchen gabapentin (NEURONTIN) 100 MG capsule Take 100 mg by mouth 2 (two) times daily.    Marland Kitchen HYDROcodone-acetaminophen (NORCO/VICODIN) 5-325 MG tablet Take 1 tablet by mouth 3 (three) times daily as needed for moderate pain.    Rise [employer] LOTN Apply 1 application topically at bedtime. *Jergens Ultra Healing Lotion*     . mometasone-formoterol (DULERA) 100-5 MCG/ACT AERO Inhale 2 puffs into the lungs 2 (two) times daily.    . montelukast (SINGULAIR) 10 MG tablet Take 10 mg by mouth at bedtime.      Marland Kitchen olmesartan-hydrochlorothiazide (BENICAR HCT) 20-12.5 MG per tablet Take 1 tablet by mouth daily.      . Probiotic Product (PROBIOTIC DAILY PO) Take 1 capsule by mouth daily.    . traMADol (ULTRAM) 50 MG tablet Take 1 tablet (50 mg total) by mouth every 6 (six) hours as needed for pain. (Patient taking differently: Take 50 mg by mouth 4 (four) times daily as needed (for neuropathy). ) 15 tablet 0  . azithromycin (ZITHROMAX) 250 MG tablet Take 1 tablet (250 mg total) by mouth daily. 1 every day until finished. (Patient not taking: Reported on 02/21/2015) 4 tablet 0  . diazepam (VALIUM) 10 MG tablet Take 10 mg by mouth every 6 (six) hours as needed for anxiety.    . predniSONE (DELTASONE) 10 MG tablet Take 1 tablet (10 mg total) by mouth daily with breakfast. (Patient not taking: Reported on 01/26/2015) 7 tablet 0  . temazepam (RESTORIL) 30 MG capsule Take 1 capsule (30 mg total) by mouth at bedtime as needed. (Patient not taking: Reported on 07/03/2015) 30 capsule 5    No results found for this or any previous visit (from the past 48 hour(s)). No results found.  ROS  Blood pressure 129/81, pulse 92, temperature 98.8 F (37.1 C), temperature source Oral, resp. rate 18, SpO2 98 %. Physical Exam Physical Examination: General appearance - alert, well appearing, and in no distress Mental status - alert, oriented to person, place, and time Chest - clear to auscultation, no wheezes, rales or rhonchi, symmetric air entry Heart - normal rate, regular rhythm, normal S1, S2, no murmurs, rubs, clicks or gallops Abdomen - soft, nontender, nondistended, no masses or organomegaly Neurological - alert, oriented, normal speech, no focal findings or movement disorder noted Right hip pain on any attempted range of motion; pulses  sensation and motor intact RLE   Assessment/Plan Failed right total hip arthroplasty- Plan acetabular vs. THA revision. Discussed in detail with patient who elects to proceed.  Gearlean Alf, MD 07/10/2015, 4:44 PM

## 2015-07-10 NOTE — Progress Notes (Signed)
Patient has multiple abrasions on left lower extremity due to recent fall and bums by wheelchair per patient.  Abrasions have intact scabs that are clean dry and intact.

## 2015-07-10 NOTE — Transfer of Care (Signed)
Immediate Anesthesia Transfer of Care Note  Patient: [name]  Procedure(s) Performed: Procedure(s): RIGHT ACETABULAR VS TOTAL HIP REVISION (Right)  Patient Location: PACU  Anesthesia Type:General  Level of Consciousness:  sedated, patient cooperative and responds to stimulation  Airway & Oxygen Therapy:Patient Spontanous Breathing and Patient connected to face mask oxgen  Post-op Assessment:  Report given to PACU RN and Post -op Vital signs reviewed and stable  Post vital signs:  Reviewed and stable  Last Vitals:  Filed Vitals:   07/10/15 1400  BP: 129/81  Pulse: 92  Temp: 37.1 C  Resp: 18    Complications: No apparent anesthesia complications

## 2015-07-10 NOTE — Anesthesia Preprocedure Evaluation (Addendum)
Anesthesia Evaluation  Patient identified by MRN, date of birth, ID band Patient awake    Reviewed: Allergy & Precautions, H&P , NPO status , Patient's Chart, lab work & pertinent test results  History of Anesthesia Complications Negative for: history of anesthetic complications  Airway Mallampati: II  TM Distance: >3 FB Neck ROM: full    Dental no notable dental hx.    Pulmonary asthma , former smoker,    Pulmonary exam normal breath sounds clear to auscultation       Cardiovascular hypertension, Pt. on medications Normal cardiovascular exam Rhythm:regular Rate:Normal     Neuro/Psych PSYCHIATRIC DISORDERS Depression negative neurological ROS     GI/Hepatic negative GI ROS, Neg liver ROS,   Endo/Other  diabetes  Renal/GU negative Renal ROS     Musculoskeletal  (+) Arthritis ,   Abdominal   Peds  Hematology negative hematology ROS (+)   Anesthesia Other Findings Does have spinal stenosis, she has had spinal anesthesia in past and prefers this over General anesthesia unless absolutely needed  No blood thinning medications or coagulation abnormalities  Reproductive/Obstetrics negative OB ROS                           Anesthesia Physical Anesthesia Plan  ASA: II  Anesthesia Plan: Spinal   Post-op Pain Management:    Induction: Intravenous  Airway Management Planned: Natural Airway  Additional Equipment:   Intra-op Plan:   Post-operative Plan:   Informed Consent: I have reviewed the patients History and Physical, chart, labs and discussed the procedure including the risks, benefits and alternatives for the proposed anesthesia with the patient or authorized representative who has indicated his/her understanding and acceptance.   Dental Advisory Given  Plan Discussed with: Anesthesiologist, CRNA and Surgeon  Anesthesia Plan Comments: (She does have asthma and uses rescue  inhaler as needed.. Will give preop breathing treatment)      Anesthesia Quick Evaluation

## 2015-07-10 NOTE — Brief Op Note (Signed)
07/10/2015  6:11 PM  PATIENT:  [name]  82 y.o. female  PRE-OPERATIVE DIAGNOSIS:  failed right total hip arthroplasty  POST-OPERATIVE DIAGNOSIS:  failed right total hip arthroplasty  PROCEDURE:  Procedure(s): RIGHT ACETABULAR VS TOTAL HIP REVISION (Right)  SURGEON:  Surgeon(s) and Role:    * Gaynelle Arabian, MD - Primary  PHYSICIAN ASSISTANT:   ASSISTANTS: Arlee Muslim, PA-C   ANESTHESIA:   spinal  EBL:  Total I/O In: 1000 [I.V.:1000] Out: 650 [Urine:400; Blood:250]  BLOOD ADMINISTERED:none  DRAINS: (Medium) Hemovact drain(s) in the right hip with  Suction Open   LOCAL MEDICATIONS USED:  MARCAINE     COUNTS:  YES  TOURNIQUET:  * No tourniquets in log *  DICTATION: .Other Dictation: Dictation Number [id number]  PLAN OF CARE: Admit to inpatient   PATIENT DISPOSITION:  PACU - hemodynamically stable.

## 2015-07-10 NOTE — Interval H&P Note (Signed)
History and Physical Interval Note:  07/10/2015 4:48 PM  [name]  has presented today for surgery, with the diagnosis of failed right total hip arthroplasty  The various methods of treatment have been discussed with the patient and family. After consideration of risks, benefits and other options for treatment, the patient has consented to  Procedure(s): RIGHT ACETABULAR VS TOTAL HIP REVISION (Right) as a surgical intervention .  The patient's history has been reviewed, patient examined, no change in status, stable for surgery.  I have reviewed the patient's chart and labs.  Questions were answered to the patient's satisfaction.     Gearlean Alf

## 2015-07-10 NOTE — Anesthesia Procedure Notes (Signed)
Spinal Patient location during procedure: OR Start time: 07/10/2015 4:54 PM End time: 07/10/2015 4:56 PM Staffing Anesthesiologist: Lavaris Sexson Performed by: anesthesiologist  Preanesthetic Checklist Completed: patient identified, site marked, surgical consent, pre-op evaluation, timeout performed, IV checked, risks and benefits discussed and monitors and equipment checked Spinal Block Patient position: sitting Prep: ChloraPrep Patient monitoring: heart rate Approach: midline Location: L4-5 Injection technique: single-shot Needle Needle type: Pencan  Needle gauge: 24 G Needle length: 9 cm Assessment Sensory level: T6 Additional Notes Functioning IV was confirmed and monitors were applied. Sterile prep and drape, including hand hygiene, mask and sterile gloves were used. The patient was positioned and the spine was prepped. The skin was anesthetized with lidocaine.  Free flow of clear CSF was obtained prior to injecting local anesthetic into the CSF.  The spinal needle aspirated freely following injection.  The needle was carefully withdrawn.  The patient tolerated the procedure well. Consent was obtained prior to procedure with all questions answered and concerns addressed.  Mariah Pink, MD

## 2015-07-10 NOTE — Progress Notes (Signed)
Patient unable to void for U/A in Short Stay. Notified Dr. Wynelle Link. Stated they would obtain U/A in OR.

## 2015-07-10 NOTE — Progress Notes (Signed)
Patient states she does not have Diabetes and declines CBG.  RN spoke with Daphene Jaeger, RN at [facility] to reconciled medications taken on medication list.  Patient states she saw PCP yesterday due to irritation im mouth.  MD gave patient Azithromycin one time dose.  Patient unable to recall medication dose.  She states this was a one time dose.  Denies fever, drainage or foul odor.

## 2015-07-11 ENCOUNTER — Encounter (HOSPITAL_COMMUNITY): Payer: Self-pay

## 2015-07-11 LAB — BASIC METABOLIC PANEL
ANION GAP: 7 (ref 5–15)
BUN: 7 mg/dL (ref 6–20)
CALCIUM: 9 mg/dL (ref 8.9–10.3)
CO2: 26 mmol/L (ref 22–32)
Chloride: 102 mmol/L (ref 101–111)
Creatinine, Ser: 0.58 mg/dL (ref 0.44–1.00)
GFR calc non Af Amer: >60 mL/min (ref >60)
Glucose, Bld: 170 mg/dL — ABNORMAL HIGH (ref 65–99)
POTASSIUM: 4.5 mmol/L (ref 3.5–5.1)
Sodium: 135 mmol/L (ref 135–145)

## 2015-07-11 LAB — CBC
HEMATOCRIT: 35.5 % — AB (ref 36.0–46.0)
HEMOGLOBIN: 11.9 g/dL — AB (ref 12.0–15.0)
MCH: 32 pg (ref 26.0–34.0)
MCHC: 33.5 g/dL (ref 30.0–36.0)
MCV: 95.4 fL (ref 78.0–100.0)
Platelets: 245 10*3/uL (ref 150–400)
RBC: 3.72 MIL/uL — AB (ref 3.87–5.11)
RDW: 13.8 % (ref 11.5–15.5)
WBC: 8.8 10*3/uL (ref 4.0–10.5)

## 2015-07-11 NOTE — Progress Notes (Signed)
Nurse gave patient Dilaudid 0.5mg  IV. Nurse flushed IV with NS, administered Dilaudid. When nurse flushed IV after Dilaudid, patient complained of flush hurting. IV infiltrated during administration of NS flush after receiving Dilaudid. There is a raised area on patients inner, left forearm. Nurse applied heat pack.

## 2015-07-11 NOTE — Progress Notes (Signed)
   Subjective: 1 Day Post-Op Procedure(s) (LRB): RIGHT ACETABULAR VS TOTAL HIP REVISION (Right) Patient reports pain as mild.   Patient seen in rounds with Dr. Wynelle Link.  Able to get some sleep last night.  Doing better than she thought. Patient is well, but has had some minor complaints of pain in the hip, requiring pain medications We will start therapy today.  Plan is to go Skilled nursing facility after hospital stay.  Objective: Vital signs in last 24 hours: Temp:  [97.7 F (36.5 C)-98.8 F (37.1 C)] 97.9 F (36.6 C) (05/25 0645) Pulse Rate:  [74-92] 89 (05/25 0645) Resp:  [13-20] 16 (05/25 0645) BP: (107-136)/(55-87) 118/76 mmHg (05/25 0645) SpO2:  [93 %-100 %] 95 % (05/25 0645)  Intake/Output from previous day:  Intake/Output Summary (Last 24 hours) at 07/11/15 0750 Last data filed at 07/11/15 0646  Gross per 24 hour  Intake   2060 ml  Output   1895 ml  Net    165 ml    Intake/Output this shift: UOP 850  Labs:  Recent Labs  07/11/15 0342  HGB 11.9*    Recent Labs  07/11/15 0342  WBC 8.8  RBC 3.72*  HCT 35.5*  PLT 245    Recent Labs  07/11/15 0342  NA 135  K 4.5  CL 102  CO2 26  BUN 7  CREATININE 0.58  GLUCOSE 170*  CALCIUM 9.0   No results for input(s): LABPT, INR in the last 72 hours.  EXAM General - Patient is Alert, Appropriate and Oriented Extremity - Neurovascular intact Sensation intact distally Dorsiflexion/Plantar flexion intact Dressing - dressing C/D/I Motor Function - intact, moving foot and toes well on exam.  Hemovac pulled without difficulty.  Past Medical History  Diagnosis Date  . Asthma   . Depression   . Hypertension   . Arthritis   . Diverticulosis of colon   . History of colon polyps   . Insomnia   . Breast cancer ([place]) 2006    right w/lumpectomy  . Endometrial hyperplasia 01/1996    fibroids, adenomyosis  . Spinal stenosis 2001  . Osteopenia 12/08    hip  . Vitamin D deficiency   . Diabetes ([place]) 2007   . Frequent UTI     Assessment/Plan: 1 Day Post-Op Procedure(s) (LRB): RIGHT ACETABULAR VS TOTAL HIP REVISION (Right) Active Problems:   Failed total hip arthroplasty (HCC)  Estimated body mass index is 26.62 kg/(m^2) as calculated from the following:   Height as of 07/04/15: 5\' 7"  (1.702 m).   Weight as of 02/21/15: 77.111 kg (170 lb). Advance diet Up with therapy Discharge to SNF when improved  DVT Prophylaxis - Xarelto Weight Bearing As Tolerated right Leg Hemovac Pulled Begin Therapy  Arlee Muslim, PA-C Orthopaedic Surgery 07/11/2015, 7:50 AM

## 2015-07-11 NOTE — Progress Notes (Signed)
Occupational Therapy Treatment Patient Details Name: [name] MRN: [id number] DOB: 08-04-1930 Today's Date: 07/11/2015    History of present illness R acetabulum revision due to fall   OT comments  Pt able to walk to bathroom with min +2 for safety.    Follow Up Recommendations  SNF    Equipment Recommendations  3 in 1 bedside comode    Recommendations for Other Services      Precautions / Restrictions Precautions Precautions: Posterior Hip Precaution Booklet Issued: Yes (comment) Restrictions Weight Bearing Restrictions: No Other Position/Activity Restrictions: WBAT       Mobility Bed Mobility Overal bed mobility: Needs Assistance Bed Mobility: Supine to Sit      Sit to supine: Min assist   General bed mobility comments: with use of leg lifter    Transfers Overall transfer level: Needs assistance Equipment used: Rolling walker (2 wheeled) Transfers: Sit to/from Stand Sit to Stand: Min assist        General transfer comment: cues for THPs, UE/LE placement and safety.  Tends to have posterior lean when first standing    Balance                                   ADL Overall ADL's : Needs assistance/impaired                         Toilet Transfer: Minimal assistance;+2 for safety/equipment;Ambulation;BSC;RW             General ADL Comments: educated on AE and practiced with reacher, sock aide and leg lifter with supervision.  Pt needs min cues to follow THPs when transitioning from sit <>stand      Vision                     Perception     Praxis      Cognition   Behavior During Therapy: [facility] for tasks assessed/performed Overall Cognitive Status: Within Functional Limits for tasks assessed                       Extremity/Trunk Assessment             Exercises    Shoulder Instructions       General Comments      Pertinent Vitals/ Pain       Pain Assessment: Faces Faces Pain  Scale: Hurts even more Pain Location: R LE Pain Descriptors / Indicators: Aching Pain Intervention(s): Limited activity within patient's tolerance;Monitored during session;Premedicated before session;Repositioned;Ice applied  Home Living Family/patient expects to be discharged to:: Skilled nursing facility Living Arrangements: Spouse/significant other                               Additional Comments: pt was at [place].   Had several falls, was TDWB       Prior Functioning/Environment Level of Independence: Needs assistance        Comments: after last 2 falls, needed assistance with transfers and putting shoes on   Frequency Min 2X/week     Progress Toward Goals  OT Goals(current goals can now be found in the care plan section)  Progress towards OT goals: Progressing toward goals  Acute Rehab OT Goals Patient Stated Goal: go to rehab then get back to ALF OT Goal Formulation:  With patient Time For Goal Achievement: 07/18/15 Potential to Achieve Goals: Good ADL Goals Pt Will Perform Lower Body Bathing: with min guard assist;with adaptive equipment;sit to/from stand Pt Will Perform Lower Body Dressing: with min guard assist;with adaptive equipment;sit to/from stand Pt Will Transfer to Toilet: with min guard assist;ambulating;bedside commode Pt Will Perform Toileting - Clothing Manipulation and hygiene: with min guard assist;sit to/from stand Additional ADL Goal #1: pt will recall 3/3 THPS (posterior) and perform ADLs/toilet transfers without cues for these  Plan      Co-evaluation    PT/OT/SLP Co-Evaluation/Treatment: Yes Reason for Co-Treatment: For patient/therapist safety PT goals addressed during session: Mobility/safety with mobility OT goals addressed during session: ADL's and self-care      End of Session     Activity Tolerance Patient tolerated treatment well   Patient Left in bed;with call bell/phone within reach;with bed alarm set    Nurse Communication          Time: [id number] OT Time Calculation (min): 26 min  Charges: OT General Charges $OT Visit: 1 Procedure OT Evaluation $OT Eval Low Complexity: 1 Procedure OT Treatments $Self Care/Home Management : 8-22 mins  Tomma Ehinger 07/11/2015, 1:52 PM Lesle Chris, OTR/L [phone] 07/11/2015

## 2015-07-11 NOTE — Progress Notes (Signed)
Utilization review completed.  

## 2015-07-11 NOTE — Progress Notes (Signed)
Physical Therapy Treatment Patient Details Name: [name] MRN: [id number] DOB: 18-Feb-1930 Today's Date: 07/11/2015    History of Present Illness R acetabulum revision due to fall    PT Comments    Improvement from am session but pt continues ltd by fatigue  Follow Up Recommendations  SNF     Equipment Recommendations  None recommended by PT    Recommendations for Other Services OT consult     Precautions / Restrictions Precautions Precautions: Posterior Hip Precaution Booklet Issued: Yes (comment) Restrictions Weight Bearing Restrictions: No Other Position/Activity Restrictions: WBAT    Mobility  Bed Mobility Overal bed mobility: Needs Assistance Bed Mobility: Sit to Supine     Supine to sit: Min assist;+2 for safety/equipment Sit to supine: Min assist   General bed mobility comments: with use of leg lifter    Transfers Overall transfer level: Needs assistance Equipment used: Rolling walker (2 wheeled) Transfers: Sit to/from Stand Sit to Stand: Min assist Stand pivot transfers: Min assist;+2 safety/equipment;From elevated surface       General transfer comment: cues for THPs, UE/LE placement and safety  Ambulation/Gait Ambulation/Gait assistance: Min assist;+2 safety/equipment Ambulation Distance (Feet): 16 Feet (and 12') Assistive device: Rolling walker (2 wheeled) Gait Pattern/deviations: Step-to pattern;Step-through pattern;Decreased step length - right;Decreased step length - left;Shuffle;Trunk flexed Gait velocity: decr   General Gait Details: cues for posture, position from RW, sequence and to slow pace   Stairs            Wheelchair Mobility    Modified Rankin (Stroke Patients Only)       Balance                                    Cognition Arousal/Alertness: Awake/alert Behavior During Therapy: WFL for tasks assessed/performed Overall Cognitive Status: Within Functional Limits for tasks assessed                       Exercises Total Joint Exercises Ankle Circles/Pumps: AROM;Both;15 reps;Supine Quad Sets: AROM;Both;10 reps;Supine Heel Slides: AAROM;Right;15 reps;Supine Hip ABduction/ADduction: AAROM;Right;10 reps;Supine    General Comments        Pertinent Vitals/Pain Pain Assessment: Faces Faces Pain Scale: Hurts even more Pain Location: R LE Pain Descriptors / Indicators: Aching;Sore Pain Intervention(s): Limited activity within patient's tolerance;Monitored during session;Premedicated before session;Ice applied    Home Living Family/patient expects to be discharged to:: Skilled nursing facility Living Arrangements: Spouse/significant other             Additional Comments: pt was at [place].   Had several falls, was TDWB     Prior Function Level of Independence: Needs assistance      Comments: after last 2 falls, needed assistance with transfers and putting shoes on   PT Goals (current goals can now be found in the care plan section) Acute Rehab PT Goals Patient Stated Goal: go to rehab then get back to ALF PT Goal Formulation: With patient Potential to Achieve Goals: Good Progress towards PT goals: Progressing toward goals    Frequency  7X/week    PT Plan Current plan remains appropriate    Co-evaluation PT/OT/SLP Co-Evaluation/Treatment: Yes Reason for Co-Treatment: For patient/therapist safety PT goals addressed during session: Mobility/safety with mobility OT goals addressed during session: ADL's and self-care     End of Session Equipment Utilized During Treatment: Gait belt Activity Tolerance: Patient tolerated treatment well;Patient limited by  fatigue Patient left: in bed;with call bell/phone within reach;with bed alarm set     Time: 1248-1311 PT Time Calculation (min) (ACUTE ONLY): 23 min  Charges:  $Gait Training: 8-22 mins                    G Codes:      [name] 07/20/15, 3:03 PM

## 2015-07-11 NOTE — Clinical Social Work Note (Signed)
Clinical Social Work Assessment  Patient Details  Name: [name] MRN: [id number] Date of Birth: [birth year]  Date of referral:  07/11/15               Reason for consult:  Facility Placement, Discharge Planning                Permission sought to share information with:  Facility Art therapist granted to share information::  Yes, Verbal Permission Granted  Name::        Agency::     Relationship::     Contact Information:     Housing/Transportation Living arrangements for the past 2 months:  [place] of Information:  Patient Patient Interpreter Needed:  None Criminal Activity/Legal Involvement Pertinent to Current Situation/Hospitalization:  No - Comment as needed Significant Relationships:  Adult Children, Spouse Lives with:  Spouse, Facility Resident Do you feel safe going back to the place where you live?  No (SNF recommended.) Need for family participation in patient care:  Yes (Comment)  Care giving concerns:  Pt's care cannot be managed at ALF following hip surgery.   Social Worker assessment / plan:  Pt hospitalized on 07/10/15 for pre planned total hip arthroplasty.  Pt resides with spouse at [place]. PT has recommended SNF placement following hospital d/c. CSW met with pt / son / daughter to assist with d/c planning. Pt / family are in agreement with [facility] at d/c prior to returning to [facility]. SNF search initiated. Pt has requested [facility] for rehab . SNF contacted and bed offer received. Pt has been updated. CSW will continue to follow to assist with d/c planning to SNF.  Employment status:  Retired Nurse, adult PT Recommendations:  [place] / Referral to community resources:  [place]  Patient/Family's Response to care:  Pt / family feel [facility] is needed.  Patient/Family's Understanding of and Emotional Response to Diagnosis,  Current Treatment, and Prognosis:  Pt / family are aware of pt's medical status. " I want a pvt room at [facility] [place] on the hall with the best CNAs. I called [facility] and told them what I wanted. I also called my doctor ( Dr. Forde Dandy ) and told him the same thing. "  CSW thanked pt for her assistance with d/c planning.  Emotional Assessment Appearance:  Appears stated age Attitude/Demeanor/Rapport:  Other (cooperative) Affect (typically observed):  Pleasant, Appropriate Orientation:  Oriented to Self, Oriented to Place, Oriented to  Time, Oriented to Situation Alcohol / Substance use:  Not Applicable Psych involvement (Current and /or in the community):  No (Comment)  Discharge Needs  Concerns to be addressed:  Discharge Planning Concerns Readmission within the last 30 days:  No Current discharge risk:  None Barriers to Discharge:  No Barriers Identified   [name], [place] 07/11/2015, 3:26 PM

## 2015-07-11 NOTE — Care Management Note (Signed)
Case Management Note  Patient Details  Name: CARISSA DUGO MRN: [id number] Date of Birth: August 09, 1930  Subjective/Objective:  S/p Right acetabular revision                  Action/Plan: Discharge planning per CSW  Expected Discharge Date:                  Expected Discharge Plan:  Skilled Nursing Facility  In-House Referral:  Clinical Social Work  Discharge planning Services  CM Consult  Post Acute Care Choice:  NA Choice offered to:  NA  DME Arranged:  N/A DME Agency:  NA  HH Arranged:  NA HH Agency:  NA  Status of Service:  Completed, signed off  Medicare Important Message Given:    Date Medicare IM Given:    Medicare IM give by:    Date Additional Medicare IM Given:    Additional Medicare Important Message give by:     If discussed at [place] of Stay Meetings, dates discussed:    Additional Comments:  Guadalupe Maple, RN 07/11/2015, 11:33 AM [phone]

## 2015-07-11 NOTE — Discharge Instructions (Addendum)
Dr. Gaynelle Arabian Total Joint Specialist [facility] [address] [place], [place] [address] [phone]   POSTERIOR TOTAL HIP REPLACEMENT POSTOPERATIVE DIRECTIONS  Hip Rehabilitation, Guidelines Following Surgery  The results of a hip operation are greatly improved after range of motion and muscle strengthening exercises. Follow all safety measures which are given to protect your hip. If any of these exercises cause increased pain or swelling in your joint, decrease the amount until you are comfortable again. Then slowly increase the exercises. Call your caregiver if you have problems or questions.   HOME CARE INSTRUCTIONS  Remove items at home which could result in a fall. This includes throw rugs or furniture in walking pathways.   ICE to the affected hip every three hours for 30 minutes at a time and then as needed for pain and swelling.  Continue to use ice on the hip for pain and swelling from surgery. You may notice swelling that will progress down to the foot and ankle.  This is normal after surgery.  Elevate the leg when you are not up walking on it.    Continue to use the breathing machine which will help keep your temperature down.  It is common for your temperature to cycle up and down following surgery, especially at night when you are not up moving around and exerting yourself.  The breathing machine keeps your lungs expanded and your temperature down.  DIET You may resume your previous home diet once your are discharged from the hospital.  DRESSING / WOUND CARE / SHOWERING You may shower 3 days after surgery, but keep the wounds dry during showering.  You may use an occlusive plastic wrap (Press'n Seal for example), NO SOAKING/SUBMERGING IN THE BATHTUB.  If the bandage gets wet, change with a clean dry gauze.  If the incision gets wet, pat the wound dry with a clean towel. You may start showering once you are discharged home but do not submerge  the incision under water. Just pat the incision dry and apply a dry gauze dressing on daily. Change the surgical dressing daily and reapply a dry dressing each time.    ACTIVITY Walk with your walker as instructed. Use walker as long as suggested by your caregivers. Avoid periods of inactivity such as sitting longer than an hour when not asleep. This helps prevent blood clots.  You may resume a sexual relationship in one month or when given the OK by your doctor.  You may return to work once you are cleared by your doctor.  Do not drive a car for 6 weeks or until released by you surgeon.  Do not drive while taking narcotics.  WEIGHT BEARING Weight bearing as tolerated with assist device (walker, cane, etc) as directed, use it as long as suggested by your surgeon or therapist, typically at least 4-6 weeks.  POSTOPERATIVE CONSTIPATION PROTOCOL Constipation - defined medically as fewer than three stools per week and severe constipation as less than one stool per week.  One of the most common issues patients have following surgery is constipation.  Even if you have a regular bowel pattern at home, your normal regimen is likely to be disrupted due to multiple reasons following surgery.  Combination of anesthesia, postoperative narcotics, change in appetite and fluid intake all can affect your bowels.  In order to avoid complications following surgery, here are some recommendations in order to help you during your recovery period.  Colace (docusate) - Pick up an over-the-counter  form of Colace or another stool softener and take twice a day as long as you are requiring postoperative pain medications.  Take with a full glass of water daily.  If you experience loose stools or diarrhea, hold the colace until you stool forms back up.  If your symptoms do not get better within 1 week or if they get worse, check with your doctor.  Dulcolax (bisacodyl) - Pick up over-the-counter and take as directed by the  product packaging as needed to assist with the movement of your bowels.  Take with a full glass of water.  Use this product as needed if not relieved by Colace only.   MiraLax (polyethylene glycol) - Pick up over-the-counter to have on hand.  MiraLax is a solution that will increase the amount of water in your bowels to assist with bowel movements.  Take as directed and can mix with a glass of water, juice, soda, coffee, or tea.  Take if you go more than two days without a movement. Do not use MiraLax more than once per day. Call your doctor if you are still constipated or irregular after using this medication for 7 days in a row.  If you continue to have problems with postoperative constipation, please contact the office for further assistance and recommendations.  If you experience "the worst abdominal pain ever" or develop nausea or vomiting, please contact the office immediatly for further recommendations for treatment.  ITCHING  If you experience itching with your medications, try taking only a single pain pill, or even half a pain pill at a time.  You can also use Benadryl over the counter for itching or also to help with sleep.   TED HOSE STOCKINGS Wear the elastic stockings on both legs for three weeks following surgery during the day but you may remove then at night for sleeping.  MEDICATIONS See your medication summary on the After Visit Summary that the nursing staff will review with you prior to discharge.  You may have some home medications which will be placed on hold until you complete the course of blood thinner medication.  It is important for you to complete the blood thinner medication as prescribed by your surgeon.  Continue your approved medications as instructed at time of discharge.  PRECAUTIONS If you experience chest pain or shortness of breath - call 911 immediately for transfer to the hospital emergency department.  If you develop a fever greater that 101 F, purulent  drainage from wound, increased redness or drainage from wound, foul odor from the wound/dressing, or calf pain - CONTACT YOUR SURGEON.                                                   FOLLOW-UP APPOINTMENTS Make sure you keep all of your appointments after your operation with your surgeon and caregivers. You should call the office at the above phone number and make an appointment for approximately two weeks after the date of your surgery or on the date instructed by your surgeon outlined in the "After Visit Summary".  RANGE OF MOTION AND STRENGTHENING EXERCISES  These exercises are designed to help you keep full movement of your hip joint. Follow your caregiver's or physical therapist's instructions. Perform all exercises about fifteen times, three times per day or as directed. Exercise both hips, even if you  have had only one joint replacement. These exercises can be done on a training (exercise) mat, on the floor, on a table or on a bed. Use whatever works the best and is most comfortable for you. Use music or television while you are exercising so that the exercises are a pleasant break in your day. This will make your life better with the exercises acting as a break in routine you can look forward to.  Lying on your back, slowly slide your foot toward your buttocks, raising your knee up off the floor. Then slowly slide your foot back down until your leg is straight again.  Lying on your back spread your legs as far apart as you can without causing discomfort.  Lying on your side, raise your upper leg and foot straight up from the floor as far as is comfortable. Slowly lower the leg and repeat.  Lying on your back, tighten up the muscle in the front of your thigh (quadriceps muscles). You can do this by keeping your leg straight and trying to raise your heel off the floor. This helps strengthen the largest muscle supporting your knee.  Lying on your back, tighten up the muscles of your buttocks both  with the legs straight and with the knee bent at a comfortable angle while keeping your heel on the floor.      IF YOU ARE TRANSFERRED TO A SKILLED REHAB FACILITY If the patient is transferred to a skilled rehab facility following release from the hospital, a list of the current medications will be sent to the facility for the patient to continue.  When discharged from the skilled rehab facility, please have the facility set up the patient's [place] prior to being released. Also, the skilled facility will be responsible for providing the patient with their medications at time of release from the facility to include their pain medication, the muscle relaxants, and their blood thinner medication. If the patient is still at the rehab facility at time of the two week follow up appointment, the skilled rehab facility will also need to assist the patient in arranging follow up appointment in our office and any transportation needs.  MAKE SURE YOU:  Understand these instructions.  Get help right away if you are not doing well or get worse.    Pick up stool softner and laxative for home use following surgery while on pain medications. Do not submerge incision under water. Please use good hand washing techniques while changing dressing each day. May shower starting three days after surgery. Please use a clean towel to pat the incision dry following showers. Continue to use ice for pain and swelling after surgery. Do not use any lotions or creams on the incision until instructed by your surgeon.  Take Xarelto for two and a half more weeks, then discontinue Xarelto. Once the patient has completed the Xarelto, they may resume the 325 mg Aspirin daily.   Information on my medicine - XARELTO (Rivaroxaban)  This medication education was reviewed with me or my healthcare representative as part of my discharge preparation.  The pharmacist that spoke with me during my hospital stay  was:  Henreitta Leber., [facility]  Why was Xarelto prescribed for you? Xarelto was prescribed for you to reduce the risk of blood clots forming after orthopedic surgery. The medical term for these abnormal blood clots is venous thromboembolism (VTE).  What do you need to know about xarelto ? Take your Xarelto ONCE DAILY at the  same time every day. You may take it either with or without food.  If you have difficulty swallowing the tablet whole, you may crush it and mix in applesauce just prior to taking your dose.  Take Xarelto exactly as prescribed by your doctor and DO NOT stop taking Xarelto without talking to the doctor who prescribed the medication.  Stopping without other VTE prevention medication to take the place of Xarelto may increase your risk of developing a clot.  After discharge, you should have regular check-up appointments with your healthcare provider that is prescribing your Xarelto.    What do you do if you miss a dose? If you miss a dose, take it as soon as you remember on the same day then continue your regularly scheduled once daily regimen the next day. Do not take two doses of Xarelto on the same day.   Important Safety Information A possible side effect of Xarelto is bleeding. You should call your healthcare provider right away if you experience any of the following: ? Bleeding from an injury or your nose that does not stop. ? Unusual colored urine (red or dark brown) or unusual colored stools (red or black). ? Unusual bruising for unknown reasons. ? A serious fall or if you hit your head (even if there is no bleeding).  Some medicines may interact with Xarelto and might increase your risk of bleeding while on Xarelto. To help avoid this, consult your healthcare provider or pharmacist prior to using any new prescription or non-prescription medications, including herbals, vitamins, non-steroidal anti-inflammatory drugs (NSAIDs) and supplements.  This website has more  information on Xarelto: [email or web address]

## 2015-07-11 NOTE — NC FL2 (Signed)
[place] LEVEL OF CARE SCREENING TOOL     IDENTIFICATION  Patient Name: [name] Birthdate: 01/13/1931 Sex: female Admission Date (Current Location): 07/10/2015  [facility] and [place] Number:  Herbalist and Address:  [facility],  [place] [address], [place]      Provider Number: [id number]  Attending Physician Name and Address:  Gaynelle Arabian, MD  Relative Name and Phone Number:       Current Level of Care: Hospital Recommended Level of Care: [place] Prior Approval Number:    Date Approved/Denied:   PASRR Number:    Discharge Plan: SNF    Current Diagnoses: Patient Active Problem List   Diagnosis Date Noted  . Failed total hip arthroplasty ([place]) 07/10/2015  . Breast cancer, right breast ([place]) 07/18/2014  . CONCUSSION WITH LOC OF UNSPECIFIED DURATION 07/19/2009  . OTH SYMPTOMS INVOLVING RESPIRATORY SYSTEM&CHEST 11/12/2008  . UNSPECIFIED DISORDER OF ANKLE AND FOOT JOINT 05/11/2008  . CERUMEN IMPACTION, BILATERAL 05/04/2008  . DYSMETABOLIC SYNDROME X [id number]  . UNS ADVRS EFF UNS RX MEDICINAL&BIOLOGICAL SBSTNC 08/12/2007  . Other and unspecified hyperlipidemia 04/01/2007  . INSOMNIA, PERSISTENT 09/09/2006  . DEPRESSION 09/09/2006  . HYPERTENSION 09/09/2006  . Asthma 09/09/2006  . DIVERTICULOSIS, COLON 09/09/2006  . OSTEOARTHRITIS 09/09/2006  . STENOSIS, LUMBAR SPINE 09/09/2006  . COLONIC POLYPS, HX OF 09/09/2006    Orientation RESPIRATION BLADDER Height & Weight     Self, Time, Situation, Place  Normal Continent Weight: 76.204 kg (168 lb) Height:  5\' 7"  (170.2 cm)  BEHAVIORAL SYMPTOMS/MOOD NEUROLOGICAL BOWEL NUTRITION STATUS  Other (Comment) (no behaviors)   Continent Diet  AMBULATORY STATUS COMMUNICATION OF NEEDS Skin   Limited Assist Verbally Surgical wounds                       Personal Care Assistance Level of Assistance  Bathing, Feeding, Dressing Bathing Assistance: Limited  assistance Feeding assistance: Independent Dressing Assistance: Limited assistance     Functional Limitations Info  Sight, Hearing, Speech Sight Info: Adequate Hearing Info: Adequate Speech Info: Adequate    SPECIAL CARE FACTORS FREQUENCY  PT (By licensed PT), OT (By licensed OT)     PT Frequency: 5 x wk OT Frequency: 5 x wk            Contractures Contractures Info: Not present    Additional Factors Info  Code Status, Psychotropic Code Status Info: Full Code             Current Medications (07/11/2015):  This is the current hospital active medication list Current Facility-Administered Medications  Medication Dose Route Frequency Provider Last Rate Last Dose  . 0.9 %  sodium chloride infusion   Intravenous Continuous Arlee Muslim, PA-C 30 mL/hr at 07/11/15 [phone]    . acetaminophen (TYLENOL) tablet 650 mg  650 mg Oral Q6H PRN Gaynelle Arabian, MD       Or  . acetaminophen (TYLENOL) suppository 650 mg  650 mg Rectal Q6H PRN Gaynelle Arabian, MD      . albuterol (PROVENTIL) (2.5 MG/3ML) 0.083% nebulizer solution 3 mL  3 mL Inhalation Q4H PRN Gaynelle Arabian, MD      . bisacodyl (DULCOLAX) suppository 10 mg  10 mg Rectal Daily PRN Gaynelle Arabian, MD      . buPROPion (WELLBUTRIN XL) 24 hr tablet 300 mg  300 mg Oral Daily Gaynelle Arabian, MD   300 mg at 07/11/15 1103  . clonazePAM (KLONOPIN) tablet 1  mg  1 mg Oral Daily PRN Gaynelle Arabian, MD   1 mg at 07/10/15 2315  . diazepam (VALIUM) tablet 10 mg  10 mg Oral Q6H PRN Gaynelle Arabian, MD   10 mg at 07/11/15 1147  . diphenhydrAMINE (BENADRYL) 12.5 MG/5ML elixir 12.5-25 mg  12.5-25 mg Oral Q4H PRN Gaynelle Arabian, MD      . docusate sodium (COLACE) capsule 100 mg  100 mg Oral BID Gaynelle Arabian, MD   100 mg at 07/11/15 1103  . escitalopram (LEXAPRO) tablet 10 mg  10 mg Oral Daily Gaynelle Arabian, MD   10 mg at 07/11/15 1102  . fluticasone (FLONASE) 50 MCG/ACT nasal spray 1 spray  1 spray Each Nare BID Gaynelle Arabian, MD   1 spray at 07/11/15 1103   . gabapentin (NEURONTIN) capsule 100 mg  100 mg Oral BID Gaynelle Arabian, MD   100 mg at 07/11/15 1103  . irbesartan (AVAPRO) tablet 150 mg  150 mg Oral Daily Arlee Muslim, PA-C   150 mg at 07/11/15 1102   And  . hydrochlorothiazide (MICROZIDE) capsule 12.5 mg  12.5 mg Oral Daily Arlee Muslim, PA-C   12.5 mg at 07/11/15 1103  . HYDROcodone-acetaminophen (NORCO/VICODIN) 5-325 MG per tablet 1-2 tablet  1-2 tablet Oral Q4H PRN Gaynelle Arabian, MD   2 tablet at 07/11/15 0851  . HYDROmorphone (DILAUDID) injection 0.5 mg  0.5 mg Intravenous Q2H PRN Gaynelle Arabian, MD   0.5 mg at 07/11/15 1147  . menthol-cetylpyridinium (CEPACOL) lozenge 3 mg  1 lozenge Oral PRN Gaynelle Arabian, MD       Or  . phenol (CHLORASEPTIC) mouth spray 1 spray  1 spray Mouth/Throat PRN Gaynelle Arabian, MD      . methocarbamol (ROBAXIN) tablet 500 mg  500 mg Oral Q6H PRN Gaynelle Arabian, MD   500 mg at 07/11/15 0330   Or  . methocarbamol (ROBAXIN) 500 mg in dextrose 5 % 50 mL IVPB  500 mg Intravenous Q6H PRN Gaynelle Arabian, MD   500 mg at 07/10/15 1949  . metoCLOPramide (REGLAN) tablet 5-10 mg  5-10 mg Oral Q8H PRN Gaynelle Arabian, MD       Or  . metoCLOPramide (REGLAN) injection 5-10 mg  5-10 mg Intravenous Q8H PRN Gaynelle Arabian, MD      . mometasone-formoterol [facility]) 100-5 MCG/ACT inhaler 2 puff  2 puff Inhalation BID Gaynelle Arabian, MD   2 puff at 07/11/15 [phone]  . montelukast (SINGULAIR) tablet 10 mg  10 mg Oral QHS Gaynelle Arabian, MD   10 mg at 07/10/15 2122  . ondansetron (ZOFRAN) tablet 4 mg  4 mg Oral Q6H PRN Gaynelle Arabian, MD       Or  . ondansetron [facility]) injection 4 mg  4 mg Intravenous Q6H PRN Gaynelle Arabian, MD      . polyethylene glycol (MIRALAX / GLYCOLAX) packet 17 g  17 g Oral Daily PRN Gaynelle Arabian, MD      . rivaroxaban [facility]) tablet 10 mg  10 mg Oral Q breakfast Gaynelle Arabian, MD   10 mg at 07/11/15 0851  . sodium phosphate (FLEET) 7-19 GM/118ML enema 1 enema  1 enema Rectal Once PRN Gaynelle Arabian, MD          Discharge Medications: Please see discharge summary for a list of discharge medications.  Relevant Imaging Results:  Relevant Lab Results:   Additional Information SS # [id number]  Kaylon Hitz, Randall An, LCSW

## 2015-07-11 NOTE — Evaluation (Signed)
Physical Therapy Evaluation Patient Details Name: [name] MRN: [id number] DOB: 07/17/1930 Today's Date: 07/11/2015   History of Present Illness  R acetabulum revision due to fall  Clinical Impression  Pt s/p R THR presents with decreased R LE strength/ROM, post op pain and posterior THP limiting functional mobility.  Pt would benefit from follow up rehab at SNF level to maximize IND and safety.    Follow Up Recommendations SNF    Equipment Recommendations  None recommended by PT    Recommendations for Other Services OT consult     Precautions / Restrictions Precautions Precautions: Posterior Hip Precaution Booklet Issued: Yes (comment) Restrictions Weight Bearing Restrictions: No Other Position/Activity Restrictions: WBAT      Mobility  Bed Mobility Overal bed mobility: Needs Assistance Bed Mobility: Supine to Sit     Supine to sit: Min assist;+2 for safety/equipment     General bed mobility comments: assist for RLE and lightly for trunk.  Pt used bedrail  Transfers Overall transfer level: Needs assistance Equipment used: Rolling walker (2 wheeled) Transfers: Sit to/from Stand Sit to Stand: Min assist;From elevated surface;+2 safety/equipment Stand pivot transfers: Min assist;+2 safety/equipment;From elevated surface       General transfer comment: cues for THPs, assist to rise and steady.  Pt felt tired after first attempt; performed SPT with second stand  Ambulation/Gait Ambulation/Gait assistance: Min assist;+2 safety/equipment Ambulation Distance (Feet): 2 Feet Assistive device: Rolling walker (2 wheeled) Gait Pattern/deviations: Step-to pattern;Decreased step length - right;Decreased step length - left;Shuffle;Trunk flexed     General Gait Details: cues for posture, position from RW, sequence and to slow pace  Stairs            Wheelchair Mobility    Modified Rankin (Stroke Patients Only)       Balance                                              Pertinent Vitals/Pain Pain Assessment: Faces Faces Pain Scale: Hurts little more Pain Location: R LE Pain Descriptors / Indicators: Aching;Sore Pain Intervention(s): Limited activity within patient's tolerance;Monitored during session;Premedicated before session;Ice applied    Home Living Family/patient expects to be discharged to:: Skilled nursing facility Living Arrangements: Spouse/significant other               Additional Comments: pt was at [place].   Had several falls, was TDWB     Prior Function Level of Independence: Needs assistance         Comments: after last 2 falls, needed assistance with transfers and putting shoes on     Hand Dominance        Extremity/Trunk Assessment   Upper Extremity Assessment: Overall WFL for tasks assessed           Lower Extremity Assessment: LLE deficits/detail   LLE Deficits / Details: Strength at L hip 2/5 with AAROM at hip to 75 flex and 10 abd     Communication   Communication: No difficulties  Cognition Arousal/Alertness: Awake/alert Behavior During Therapy: WFL for tasks assessed/performed Overall Cognitive Status: Within Functional Limits for tasks assessed                      General Comments      Exercises Total Joint Exercises Ankle Circles/Pumps: AROM;Both;15 reps;Supine Quad Sets: AROM;Both;10 reps;Supine Heel Slides: AAROM;Right;15 reps;Supine  Hip ABduction/ADduction: AAROM;Right;10 reps;Supine      Assessment/Plan    PT Assessment Patient needs continued PT services  PT Diagnosis Difficulty walking   PT Problem List Decreased strength;Decreased range of motion;Decreased activity tolerance;Decreased mobility;Decreased knowledge of use of DME;Pain;Obesity;Decreased knowledge of precautions  PT Treatment Interventions DME instruction;Gait training;Stair training;Functional mobility training;Therapeutic activities;Therapeutic  exercise;Patient/family education   PT Goals (Current goals can be found in the Care Plan section) Acute Rehab PT Goals Patient Stated Goal: go to rehab then get back to ALF PT Goal Formulation: With patient Potential to Achieve Goals: Good    Frequency 7X/week   Barriers to discharge        Co-evaluation PT/OT/SLP Co-Evaluation/Treatment: Yes Reason for Co-Treatment: For patient/therapist safety PT goals addressed during session: Mobility/safety with mobility OT goals addressed during session: ADL's and self-care       End of Session Equipment Utilized During Treatment: Gait belt Activity Tolerance: Patient tolerated treatment well;Patient limited by fatigue Patient left: in chair;with call bell/phone within reach;with chair alarm set Nurse Communication: Mobility status         Time: 1035-1110 PT Time Calculation (min) (ACUTE ONLY): 35 min   Charges:   PT Evaluation $PT Eval Low Complexity: 1 Procedure     PT G Codes:        Kaylany Tesoriero 07/26/2015, 12:20 PM

## 2015-07-11 NOTE — Evaluation (Signed)
Occupational Therapy Evaluation Patient Details Name: [name] MRN: [id number] DOB: 08-13-1930 Today's Date: 07/11/2015    History of Present Illness R acetabulum revision due to fall   Clinical Impression   This 82 year old female was admitted for the above. She reports that she had several falls since Dec and was TDWB prior to sx. She needed assist for transfers and ADLs after this.  Pt currently needs min A +2 for safety for transfers and ADLs. Goals in acute are for min guard level    Follow Up Recommendations  SNF    Equipment Recommendations  3 in 1 bedside comode    Recommendations for Other Services       Precautions / Restrictions Precautions Precautions: Posterior Hip Precaution Booklet Issued: Yes (comment) Restrictions Weight Bearing Restrictions: No      Mobility Bed Mobility Overal bed mobility: Needs Assistance Bed Mobility: Supine to Sit     Supine to sit: Min assist;+2 for safety/equipment     General bed mobility comments: assist for RLE and lightly for trunk.  Pt used bedrail  Transfers Overall transfer level: Needs assistance Equipment used: Rolling walker (2 wheeled) Transfers: Sit to/from [facility] Sit to Stand: Min assist;From elevated surface;+2 safety/equipment Stand pivot transfers: Min assist;+2 safety/equipment;From elevated surface       General transfer comment: cues for THPs, UE/LE placement and safety.  Assist to rise and steady.  Pt felt tired after first attempt; performed SPT with second stand    Balance                                            ADL Overall ADL's : Needs assistance/impaired                         Toilet Transfer: Minimal assistance;+2 for safety/equipment;BSC;RW;Stand-pivot             General ADL Comments: reviewed posterior THPs.  Pt wants to review use of AE, but still has catheter in. Will plan to assist her with LB dressing when this is  removed.  Pt is able to perform UB adls with set up, Min A +2 for LB adls with AE.  Pt recalled 2/3 THPS without cues; handout given     Vision     Perception     Praxis      Pertinent Vitals/Pain  Faces 4 hurts more RLE Limited activity, monitored, repositioned and ice applied     Hand Dominance     Extremity/Trunk Assessment Upper Extremity Assessment Upper Extremity Assessment: Overall WFL for tasks assessed (strength grossly 4/5.  has trigger fingers x 2 on L hand)           Communication Communication Communication: No difficulties   Cognition Arousal/Alertness: Awake/alert Behavior During Therapy: WFL for tasks assessed/performed Overall Cognitive Status: Within Functional Limits for tasks assessed                     General Comments       Exercises       Shoulder Instructions      Home Living Family/patient expects to be discharged to:: Skilled nursing facility Living Arrangements: Spouse/significant other  Additional Comments: pt was at [facility].   Had several falls, was TDWB       Prior Functioning/Environment Level of Independence: Needs assistance        Comments: after last 2 falls, needed assistance with transfers and putting shoes on    OT Diagnosis: Generalized weakness;Acute pain   OT Problem List: Decreased strength;Decreased activity tolerance;Decreased knowledge of use of DME or AE;Pain;Decreased knowledge of precautions   OT Treatment/Interventions: Self-care/ADL training;DME and/or AE instruction;Patient/family education;Cognitive remediation/compensation    OT Goals(Current goals can be found in the care plan section) Acute Rehab OT Goals Patient Stated Goal: go to rehab then get back to ALF OT Goal Formulation: With patient Time For Goal Achievement: 07/18/15 Potential to Achieve Goals: Good ADL Goals Pt Will Perform Lower Body Bathing: with min guard assist;with  adaptive equipment;sit to/from stand Pt Will Perform Lower Body Dressing: with min guard assist;with adaptive equipment;sit to/from stand Pt Will Transfer to Toilet: with min guard assist;ambulating;bedside commode Pt Will Perform Toileting - Clothing Manipulation and hygiene: with min guard assist;sit to/from stand Additional ADL Goal #1: pt will recall 3/3 THPS (posterior) and perform ADLs/toilet transfers without cues for these  OT Frequency: Min 2X/week   Barriers to D/C:            Co-evaluation PT/OT/SLP Co-Evaluation/Treatment: Yes Reason for Co-Treatment: For patient/therapist safety PT goals addressed during session: Mobility/safety with mobility OT goals addressed during session: ADL's and self-care      End of Session    Activity Tolerance: Patient limited by fatigue Patient left: in chair;with call bell/phone within reach   Time: 1032-1104 OT Time Calculation (min): 32 min Charges:  OT General Charges $OT Visit: 1 Procedure OT Evaluation $OT Eval Low Complexity: 1 Procedure G-Codes:    Rechy Bost 10-Aug-2015, 11:31 AM  Lesle Chris, OTR/L [phone] Aug 10, 2015

## 2015-07-11 NOTE — Op Note (Signed)
NAMEHEIDEE, ANANIA NO.:  [id number]  MEDICAL RECORD NO.:  [id number]  LOCATION:  [id number]                         FACILITY:  [facility]  PHYSICIAN:  Gaynelle Arabian, M.D.    DATE OF BIRTH:  10-05-1930  DATE OF PROCEDURE:  07/10/2015 DATE OF DISCHARGE:                              OPERATIVE REPORT   PREOPERATIVE DIAGNOSIS:  Failed right total hip arthroplasty.  POSTOPERATIVE DIAGNOSIS:  Failed right total hip arthroplasty.  PROCEDURE:  Right acetabular revision.  SURGEON:  Gaynelle Arabian, MD  ASSISTANT:  Alexzandrew L. Perkins, PA-C  ANESTHESIA:  Spinal.  ESTIMATED BLOOD LOSS:  250.  DRAIN:  Hemovac x1.  COMPLICATIONS:  None.  CONDITION:  Stable to recovery.  BRIEF CLINICAL NOTE:  Ms. Malen is an 82 year old female with long history in regard to her right hip.  She had a fall several weeks ago, was unable to walk.  She presented to our office where radiographs showed a loose acetabular component that had completely migrated.  She presents now for acetabular versus total hip revision.  PROCEDURE IN DETAIL:  After successful administration of spinal anesthetic, the patient was placed in the left lateral decubitus position with the right side up and held with the hip positioner.  Right lower extremity was isolated from her perineum with plastic drapes and prepped and draped in the usual sterile fashion.  A short posterolateral incision was made with a 10 blade through subcutaneous tissue to the fascia lata which was incised in-line with the skin incision.  The sciatic nerve was palpated and protected.  Pseudocapsule was excised off the femur to reveal the joint.  She had a constrained construct in place, and the head and acetabular component were still intact, but the acetabular component completely migrated.  I was able to dislodge the head from the liner and then removed the femoral head.  I was then able to retract the femur anteriorly to gain acetabular  exposure and easily remove the acetabular component, polyethylene and metal implant without difficulty.  Fortunately, there was not any considerable defect.  The acetabular bone stock looked sufficient for placement of a new porous- coated cup.  I started reaming at 53 up to 57 mm.  A 58 mm Pinnacle acetabular revision component was placed with abduction being about 45 degrees, forward flexion about 20 degrees to match her native anteversion.  The cup was impacted with outstanding purchase.  I felt comfortable without screws, but decided to place 2 dome and 2 peripheral screws in case we needed a constrained liner.  We trialed a 36 mm liner and she had Osteonics stem in place which was in good position and well fixed.  There were 36+ 10 as the largest head ball, but unfortunately it did not provide enough soft-tissue tension, would not have been a stable construct.  I thus decided to go with the constrained system.  The Pinnacle constrained liner was placed in a 52 cup which was a 32 head system.  The liner is impacted.  We then placed the 32+ 5 Osteonics head and placed the locking ring around the neck.  The hip was reduced and then the locking ring was used  to lock the liner in place.  Wound was copiously irrigated with saline solution.  The posterior tissues were reattached to the femur through drill holes with Ethibond suture. Fascia lata was closed over Hemovac drain with a running #1 V-Loc suture.  38 mL of 0.25% Marcaine was then injected into the subcu tissues and the fascia lata.  Subcu was then closed with interrupted 2-0 Vicryl and subcuticular running 4-0 Monocryl.  Drains hooked to suction. Incision cleaned and dried and a bulky sterile dressing applied.  She was awake and transported to recovery in stable condition.  Surgical assistant is a medical necessity for this procedure to do in a safe and expeditious manner.  Assistant was necessary for retraction of vital  neurovascular structures and for proper positioning and retraction of the femoral component and femur for accurate and safe placement of the acetabular component.  Also necessary for reduction of this complex construct.     Gaynelle Arabian, M.D.     FA/MEDQ  D:  07/10/2015  T:  07/11/2015  Job:  [id number]

## 2015-07-11 NOTE — Clinical Social Work Placement (Signed)
   CLINICAL SOCIAL WORK PLACEMENT  NOTE  Date:  07/11/2015  Patient Details  Name: [name] MRN: [id number] Date of Birth: 05/07/1930  Clinical Social Work is seeking post-discharge placement for this patient at the [place] level of care (*CSW will initial, date and re-position this form in  chart as items are completed):  Yes   Patient/family provided with [place] Work Department's list of facilities offering this level of care within the geographic area requested by the patient (or if unable, by the patient's family).  Yes   Patient/family informed of their freedom to choose among providers that offer the needed level of care, that participate in Medicare, Medicaid or managed care program needed by the patient, have an available bed and are willing to accept the patient.  No   Patient/family informed of [place]'s ownership interest in [facility] and [facility], as well as of the fact that they are under no obligation to receive care at these facilities.  PASRR submitted to EDS on 07/11/15     PASRR number received on       Existing PASRR number confirmed on       FL2 transmitted to all facilities in geographic area requested by pt/family on 07/11/15     FL2 transmitted to all facilities within larger geographic area on       Patient informed that his/her managed care company has contracts with or will negotiate with certain facilities, including the following:        Yes   Patient/family informed of bed offers received.  Patient chooses bed at [facility]     Physician recommends and patient chooses bed at      Patient to be transferred to [facility] on  .  Patient to be transferred to facility by       Patient family notified on   of transfer.  Name of family member notified:        PHYSICIAN       Additional Comment:     _______________________________________________ [name], [place] 07/11/2015, 3:42 PM

## 2015-07-12 LAB — BASIC METABOLIC PANEL
Anion gap: 3 — ABNORMAL LOW (ref 5–15)
BUN: 12 mg/dL (ref 6–20)
CALCIUM: 8.8 mg/dL — AB (ref 8.9–10.3)
CO2: 30 mmol/L (ref 22–32)
CREATININE: 0.66 mg/dL (ref 0.44–1.00)
Chloride: 106 mmol/L (ref 101–111)
GFR calc Af Amer: >60 mL/min (ref >60)
GFR calc non Af Amer: >60 mL/min (ref >60)
GLUCOSE: 122 mg/dL — AB (ref 65–99)
Potassium: 3.9 mmol/L (ref 3.5–5.1)
Sodium: 139 mmol/L (ref 135–145)

## 2015-07-12 LAB — CBC
HEMATOCRIT: 32.5 % — AB (ref 36.0–46.0)
Hemoglobin: 11.3 g/dL — ABNORMAL LOW (ref 12.0–15.0)
MCH: 32.9 pg (ref 26.0–34.0)
MCHC: 34.8 g/dL (ref 30.0–36.0)
MCV: 94.8 fL (ref 78.0–100.0)
Platelets: 224 10*3/uL (ref 150–400)
RBC: 3.43 MIL/uL — ABNORMAL LOW (ref 3.87–5.11)
RDW: 14 % (ref 11.5–15.5)
WBC: 10.3 10*3/uL (ref 4.0–10.5)

## 2015-07-12 MED ORDER — HYDROCODONE-ACETAMINOPHEN 7.5-325 MG PO TABS: 1.0000 | ORAL_TABLET | ORAL | Status: DC | PRN

## 2015-07-12 MED ORDER — BISACODYL 10 MG RE SUPP: 10.0000 mg | Freq: Every day | RECTAL | Status: DC | PRN

## 2015-07-12 MED ORDER — FLEET ENEMA 7-19 GM/118ML RE ENEM: 1.0000 | ENEMA | Freq: Once | RECTAL | Status: DC | PRN

## 2015-07-12 MED ORDER — TRAMADOL HCL 50 MG PO TABS: 50.0000 mg | ORAL_TABLET | Freq: Four times a day (QID) | ORAL | Status: DC | PRN

## 2015-07-12 MED ORDER — HYDROCODONE-ACETAMINOPHEN 7.5-325 MG PO TABS
1.0000 | ORAL_TABLET | ORAL | Status: DC | PRN
  Administered 2015-07-12 (×4): 1 via ORAL
  Filled 2015-07-12 (×2): qty 2

## 2015-07-12 MED ORDER — POLYETHYLENE GLYCOL 3350 17 G PO PACK: 17.0000 g | PACK | Freq: Every day | ORAL | Status: AC | PRN

## 2015-07-12 MED ORDER — RIVAROXABAN 10 MG PO TABS: 10.0000 mg | ORAL_TABLET | Freq: Every day | ORAL | Status: DC

## 2015-07-12 MED ORDER — ONDANSETRON HCL 4 MG PO TABS: 4.0000 mg | ORAL_TABLET | Freq: Four times a day (QID) | ORAL | Status: DC | PRN

## 2015-07-12 MED ORDER — ACETAMINOPHEN 325 MG PO TABS: 650.0000 mg | ORAL_TABLET | Freq: Four times a day (QID) | ORAL | Status: DC | PRN

## 2015-07-12 MED ORDER — METHOCARBAMOL 500 MG PO TABS: 500.0000 mg | ORAL_TABLET | Freq: Four times a day (QID) | ORAL | Status: DC | PRN

## 2015-07-12 MED ORDER — METOCLOPRAMIDE HCL 5 MG PO TABS: 5.0000 mg | ORAL_TABLET | Freq: Three times a day (TID) | ORAL | Status: DC | PRN

## 2015-07-12 NOTE — Progress Notes (Signed)
Physical Therapy Treatment Patient Details Name: [name] MRN: [id number] DOB: 10-10-1930 Today's Date: 07/12/2015    History of Present Illness R acetabulum revision due to fall    PT Comments    Pt progressing well with mobility.  Follow Up Recommendations  SNF     Equipment Recommendations  None recommended by PT    Recommendations for Other Services OT consult     Precautions / Restrictions Precautions Precautions: Posterior Hip Precaution Booklet Issued: Yes (comment) Precaution Comments: Pt recalls 2/3 THP without cues Restrictions Weight Bearing Restrictions: No Other Position/Activity Restrictions: WBAT    Mobility  Bed Mobility Overal bed mobility: Needs Assistance Bed Mobility: Sit to Supine       Sit to supine: Min assist   General bed mobility comments: cues for sequence and use of L LE to self assist  Transfers Overall transfer level: Needs assistance Equipment used: Rolling walker (2 wheeled) Transfers: Sit to/from Stand Sit to Stand: Min assist         General transfer comment: cues for THPs, UE/LE placement and safety  Ambulation/Gait Ambulation/Gait assistance: Min assist Ambulation Distance (Feet): 100 Feet Assistive device: Rolling walker (2 wheeled) Gait Pattern/deviations: Step-to pattern;Step-through pattern;Decreased step length - right;Decreased step length - left;Shuffle;Trunk flexed Gait velocity: decr   General Gait Details: cues for posture, position from RW, sequence and to slow pace   Stairs            Wheelchair Mobility    Modified Rankin (Stroke Patients Only)       Balance                                    Cognition Arousal/Alertness: Awake/alert Behavior During Therapy: WFL for tasks assessed/performed Overall Cognitive Status: Within Functional Limits for tasks assessed                      Exercises Total Joint Exercises Ankle Circles/Pumps: AROM;Both;15  reps;Supine Quad Sets: AROM;Both;10 reps;Supine Heel Slides: AAROM;Right;Supine;20 reps Hip ABduction/ADduction: AAROM;Right;Supine;15 reps    General Comments        Pertinent Vitals/Pain Pain Assessment: Faces Faces Pain Scale: Hurts even more Pain Location: R LE Pain Descriptors / Indicators: Aching;Sore Pain Intervention(s): Limited activity within patient's tolerance;Monitored during session;Premedicated before session;Ice applied    Home Living                      Prior Function            PT Goals (current goals can now be found in the care plan section) Acute Rehab PT Goals Patient Stated Goal: go to rehab then get back to ALF PT Goal Formulation: With patient Potential to Achieve Goals: Good Progress towards PT goals: Progressing toward goals    Frequency  7X/week    PT Plan Current plan remains appropriate    Co-evaluation             End of Session Equipment Utilized During Treatment: Gait belt Activity Tolerance: Patient tolerated treatment well Patient left: in bed;with call bell/phone within reach;with bed alarm set     Time: [id number] PT Time Calculation (min) (ACUTE ONLY): 33 min  Charges:  $Gait Training: 8-22 mins $Therapeutic Exercise: 8-22 mins                    G Codes:      Arti Trang  07/12/2015, 12:26 PM

## 2015-07-12 NOTE — Progress Notes (Addendum)
   Subjective: 2 Days Post-Op Procedure(s) (LRB): RIGHT ACETABULAR VS TOTAL HIP REVISION (Right) Patient reports pain as mild.   Patient seen in rounds with Dr. Wynelle Link. Patient is well, but has had some minor complaints of pain in the hip, requiring pain medications but much better since the surgery. Patient is ready to go to [facility]  Objective: Vital signs in last 24 hours: Temp:  [97.6 F (36.4 C)-100 F (37.8 C)] 98.9 F (37.2 C) (05/26 0612) Pulse Rate:  [99-109] 99 (05/26 0612) Resp:  [16] 16 (05/26 0612) BP: (91-137)/(45-85) 137/58 mmHg (05/26 0612) SpO2:  [93 %-100 %] 99 % (05/26 0612) Weight:  [76.204 kg (168 lb)] 76.204 kg (168 lb) (05/25 0900)  Intake/Output from previous day:  Intake/Output Summary (Last 24 hours) at 07/12/15 0830 Last data filed at 07/12/15 [id number]  Gross per 24 hour  Intake 1535.75 ml  Output   2000 ml  Net -464.25 ml   Labs:  Recent Labs  07/11/15 0342 07/12/15 0358  HGB 11.9* 11.3*    Recent Labs  07/11/15 0342 07/12/15 0358  WBC 8.8 10.3  RBC 3.72* 3.43*  HCT 35.5* 32.5*  PLT 245 224    Recent Labs  07/11/15 0342 07/12/15 0358  NA 135 139  K 4.5 3.9  CL 102 106  CO2 26 30  BUN 7 12  CREATININE 0.58 0.66  GLUCOSE 170* 122*  CALCIUM 9.0 8.8*   No results for input(s): LABPT, INR in the last 72 hours.  EXAM: General - Patient is Alert, Appropriate and Oriented Extremity - Neurovascular intact Sensation intact distally Dorsiflexion/Plantar flexion intact Incision - clean, dry, no drainage Motor Function - intact, moving foot and toes well on exam.   Assessment/Plan: 2 Days Post-Op Procedure(s) (LRB): RIGHT ACETABULAR VS TOTAL HIP REVISION (Right) Procedure(s) (LRB): RIGHT ACETABULAR VS TOTAL HIP REVISION (Right) Past Medical History  Diagnosis Date  . Asthma   . Depression   . Hypertension   . Arthritis   . Diverticulosis of colon   . History of colon polyps   . Insomnia   . Breast cancer ([place]) 2006      right w/lumpectomy  . Endometrial hyperplasia 01/1996    fibroids, adenomyosis  . Spinal stenosis 2001  . Osteopenia 12/08    hip  . Vitamin D deficiency   . Diabetes ([place]) 2007  . Frequent UTI    Active Problems:   Failed total hip arthroplasty ([place])  Estimated body mass index is 26.31 kg/(m^2) as calculated from the following:   Height as of this encounter: 5\' 7"  (1.702 m).   Weight as of this encounter: 76.204 kg (168 lb). Up with therapy Discharge to SNF Diet - Cardiac diet and Diabetic diet Follow up - in 2 weeks Activity - WBAT Disposition - Skilled nursing facility Condition Upon Discharge - Stable D/C Meds - See DC Summary DVT Prophylaxis - Xarelto  Arlee Muslim, PA-C Orthopaedic Surgery 07/12/2015, 8:30 AM

## 2015-07-12 NOTE — Clinical Social Work Placement (Signed)
   CLINICAL SOCIAL WORK PLACEMENT  NOTE  Date:  07/12/2015  Patient Details  Name: [name] MRN: [id number] Date of Birth: 1930/12/22  Clinical Social Work is seeking post-discharge placement for this patient at the [place] level of care (*CSW will initial, date and re-position this form in  chart as items are completed):  Yes   Patient/family provided with [place] Work Department's list of facilities offering this level of care within the geographic area requested by the patient (or if unable, by the patient's family).  Yes   Patient/family informed of their freedom to choose among providers that offer the needed level of care, that participate in Medicare, Medicaid or managed care program needed by the patient, have an available bed and are willing to accept the patient.  No   Patient/family informed of [place]'s ownership interest in [facility] and [facility], as well as of the fact that they are under no obligation to receive care at these facilities.  PASRR submitted to EDS on 07/11/15     PASRR number received on       Existing PASRR number confirmed on       FL2 transmitted to all facilities in geographic area requested by pt/family on 07/11/15     FL2 transmitted to all facilities within larger geographic area on       Patient informed that his/her managed care company has contracts with or will negotiate with certain facilities, including the following:        Yes   Patient/family informed of bed offers received.  Patient chooses bed at [facility]     Physician recommends and patient chooses bed at      Patient to be transferred to [facility] on 07/12/15.  Patient to be transferred to facility by [facility]     Patient family notified on 07/12/15 of transfer.  Name of family member notified:  DAUGHTER     PHYSICIAN       Additional Comment: Pt / daughter are in agreement with  d/c to [facility] [place] today. PT approved transport by car. D/C summary sent to SNF for view prior to d/c. Scripts included in d/c packet. D/C packet provided to pt. # for report provided to nsg.   _______________________________________________ [name], [place]  [phone] 07/12/2015, 2:16 PM

## 2015-07-12 NOTE — Discharge Summary (Signed)
Physician Discharge Summary   Patient ID: [name] MRN: [id number] DOB/AGE: 1930/08/03 82 y.o.  Admit date: 07/10/2015 Discharge date: 07-12-2015  Primary Diagnosis:  Failed right total hip arthroplasty.  Admission Diagnoses:  Past Medical History  Diagnosis Date  . Asthma   . Depression   . Hypertension   . Arthritis   . Diverticulosis of colon   . History of colon polyps   . Insomnia   . Breast cancer ([place]) 2006    right w/lumpectomy  . Endometrial hyperplasia 01/1996    fibroids, adenomyosis  . Spinal stenosis 2001  . Osteopenia 12/08    hip  . Vitamin D deficiency   . Diabetes ([place]) 2007  . Frequent UTI    Discharge Diagnoses:   Active Problems:   Failed total hip arthroplasty ([place])  Estimated body mass index is 26.31 kg/(m^2) as calculated from the following:   Height as of this encounter: '5\' 7"'  (1.702 m).   Weight as of this encounter: 76.204 kg (168 lb).  Procedure(s) (LRB): RIGHT ACETABULAR VS TOTAL HIP REVISION (Right)   Consults: None  HPI: Ms. Frankum is an 82 year old female with long history in regard to her right hip. She had a fall several weeks ago, was unable to walk. She presented to our office where radiographs showed a loose acetabular component that had completely migrated. She presents now for acetabular versus total hip revision.  Laboratory Data: Admission on 07/10/2015  Component Date Value Ref Range Status  . Color, Urine 07/10/2015 YELLOW  YELLOW Final  . APPearance 07/10/2015 CLEAR  CLEAR Final  . Specific Gravity, Urine 07/10/2015 1.007  1.005 - 1.030 Final  . pH 07/10/2015 7.5  5.0 - 8.0 Final  . Glucose, UA 07/10/2015 NEGATIVE  NEGATIVE mg/dL Final  . Hgb urine dipstick 07/10/2015 NEGATIVE  NEGATIVE Final  . Bilirubin Urine 07/10/2015 NEGATIVE  NEGATIVE Final  . Ketones, ur 07/10/2015 NEGATIVE  NEGATIVE mg/dL Final  . Protein, ur 07/10/2015 NEGATIVE  NEGATIVE mg/dL Final  . Nitrite 07/10/2015 NEGATIVE  NEGATIVE Final  .  Leukocytes, UA 07/10/2015 NEGATIVE  NEGATIVE Final   MICROSCOPIC NOT DONE ON URINES WITH NEGATIVE PROTEIN, BLOOD, LEUKOCYTES, NITRITE, OR GLUCOSE <1000 mg/dL.  . WBC 07/11/2015 8.8  4.0 - 10.5 K/uL Final  . RBC 07/11/2015 3.72* 3.87 - 5.11 MIL/uL Final  . Hemoglobin 07/11/2015 11.9* 12.0 - 15.0 g/dL Final  . HCT 07/11/2015 35.5* 36.0 - 46.0 % Final  . MCV 07/11/2015 95.4  78.0 - 100.0 fL Final  . MCH 07/11/2015 32.0  26.0 - 34.0 pg Final  . MCHC 07/11/2015 33.5  30.0 - 36.0 g/dL Final  . RDW 07/11/2015 13.8  11.5 - 15.5 % Final  . Platelets 07/11/2015 245  150 - 400 K/uL Final  . Sodium 07/11/2015 135  135 - 145 mmol/L Final  . Potassium 07/11/2015 4.5  3.5 - 5.1 mmol/L Final  . Chloride 07/11/2015 102  101 - 111 mmol/L Final  . CO2 07/11/2015 26  22 - 32 mmol/L Final  . Glucose, Bld 07/11/2015 170* 65 - 99 mg/dL Final  . BUN 07/11/2015 7  6 - 20 mg/dL Final  . Creatinine, Ser 07/11/2015 0.58  0.44 - 1.00 mg/dL Final  . Calcium 07/11/2015 9.0  8.9 - 10.3 mg/dL Final  . GFR calc non Af Amer 07/11/2015 >60  >60 mL/min Final  . GFR calc Af Amer 07/11/2015 >60  >60 mL/min Final   Comment: (NOTE) The eGFR has been calculated using the CKD  EPI equation. This calculation has not been validated in all clinical situations. eGFR's persistently <60 mL/min signify possible Chronic Kidney Disease.   . Anion gap 07/11/2015 7  5 - 15 Final  . WBC 07/12/2015 10.3  4.0 - 10.5 K/uL Final  . RBC 07/12/2015 3.43* 3.87 - 5.11 MIL/uL Final  . Hemoglobin 07/12/2015 11.3* 12.0 - 15.0 g/dL Final  . HCT 07/12/2015 32.5* 36.0 - 46.0 % Final  . MCV 07/12/2015 94.8  78.0 - 100.0 fL Final  . MCH 07/12/2015 32.9  26.0 - 34.0 pg Final  . MCHC 07/12/2015 34.8  30.0 - 36.0 g/dL Final  . RDW 07/12/2015 14.0  11.5 - 15.5 % Final  . Platelets 07/12/2015 224  150 - 400 K/uL Final  . Sodium 07/12/2015 139  135 - 145 mmol/L Final  . Potassium 07/12/2015 3.9  3.5 - 5.1 mmol/L Final  . Chloride 07/12/2015 106  101 -  111 mmol/L Final  . CO2 07/12/2015 30  22 - 32 mmol/L Final  . Glucose, Bld 07/12/2015 122* 65 - 99 mg/dL Final  . BUN 07/12/2015 12  6 - 20 mg/dL Final  . Creatinine, Ser 07/12/2015 0.66  0.44 - 1.00 mg/dL Final  . Calcium 07/12/2015 8.8* 8.9 - 10.3 mg/dL Final  . GFR calc non Af Amer 07/12/2015 >60  >60 mL/min Final  . GFR calc Af Amer 07/12/2015 >60  >60 mL/min Final   Comment: (NOTE) The eGFR has been calculated using the CKD EPI equation. This calculation has not been validated in all clinical situations. eGFR's persistently <60 mL/min signify possible Chronic Kidney Disease.   Georgiann Hahn gap 07/12/2015 3* 5 - 15 Final  Hospital Outpatient Visit on 07/04/2015  Component Date Value Ref Range Status  . Hgb A1c MFr Bld 07/04/2015 5.9* 4.8 - 5.6 % Final   Comment: (NOTE)         Pre-diabetes: 5.7 - 6.4         Diabetes: >6.4         Glycemic control for adults with diabetes: <7.0   . Mean Plasma Glucose 07/04/2015 123   Final   Comment: (NOTE) Performed At: [facility] [place], [place] [id number] Lindon Romp MD SM:[id number]   . MRSA, PCR 07/04/2015 NEGATIVE  NEGATIVE Final  . Staphylococcus aureus 07/04/2015 NEGATIVE  NEGATIVE Final   Comment:        The Xpert SA Assay (FDA approved for NASAL specimens in patients over 38 years of age), is one component of a comprehensive surveillance program.  Test performance has been validated by [facility] for patients greater than or equal to 72 year old. It is not intended to diagnose infection nor to guide or monitor treatment.   . WBC 07/04/2015 6.1  4.0 - 10.5 K/uL Final  . RBC 07/04/2015 4.27  3.87 - 5.11 MIL/uL Final  . Hemoglobin 07/04/2015 13.5  12.0 - 15.0 g/dL Final  . HCT 07/04/2015 40.7  36.0 - 46.0 % Final  . MCV 07/04/2015 95.3  78.0 - 100.0 fL Final  . MCH 07/04/2015 31.6  26.0 - 34.0 pg Final  . MCHC 07/04/2015 33.2  30.0 - 36.0 g/dL Final  . RDW 07/04/2015 13.8  11.5 - 15.5 % Final    . Platelets 07/04/2015 271  150 - 400 K/uL Final  . Neutrophils Relative % 07/04/2015 61   Final  . Neutro Abs 07/04/2015 3.8  1.7 - 7.7 K/uL Final  . Lymphocytes Relative 07/04/2015 23  Final  . Lymphs Abs 07/04/2015 1.4  0.7 - 4.0 K/uL Final  . Monocytes Relative 07/04/2015 12   Final  . Monocytes Absolute 07/04/2015 0.7  0.1 - 1.0 K/uL Final  . Eosinophils Relative 07/04/2015 3   Final  . Eosinophils Absolute 07/04/2015 0.2  0.0 - 0.7 K/uL Final  . Basophils Relative 07/04/2015 1   Final  . Basophils Absolute 07/04/2015 0.0  0.0 - 0.1 K/uL Final  . Sodium 07/04/2015 137  135 - 145 mmol/L Final  . Potassium 07/04/2015 5.2* 3.5 - 5.1 mmol/L Final  . Chloride 07/04/2015 101  101 - 111 mmol/L Final  . CO2 07/04/2015 28  22 - 32 mmol/L Final  . Glucose, Bld 07/04/2015 95  65 - 99 mg/dL Final  . BUN 07/04/2015 15  6 - 20 mg/dL Final  . Creatinine, Ser 07/04/2015 0.68  0.44 - 1.00 mg/dL Final  . Calcium 07/04/2015 9.7  8.9 - 10.3 mg/dL Final  . Total Protein 07/04/2015 6.9  6.5 - 8.1 g/dL Final  . Albumin 07/04/2015 4.3  3.5 - 5.0 g/dL Final  . AST 07/04/2015 25  15 - 41 U/L Final  . ALT 07/04/2015 20  14 - 54 U/L Final  . Alkaline Phosphatase 07/04/2015 161* 38 - 126 U/L Final  . Total Bilirubin 07/04/2015 0.4  0.3 - 1.2 mg/dL Final  . GFR calc non Af Amer 07/04/2015 >60  >60 mL/min Final  . GFR calc Af Amer 07/04/2015 >60  >60 mL/min Final   Comment: (NOTE) The eGFR has been calculated using the CKD EPI equation. This calculation has not been validated in all clinical situations. eGFR's persistently <60 mL/min signify possible Chronic Kidney Disease.   . Anion gap 07/04/2015 8  5 - 15 Final  . Prothrombin Time 07/04/2015 13.7  11.6 - 15.2 seconds Final  . INR 07/04/2015 1.07  0.00 - 1.49 Final  . aPTT 07/04/2015 31  24 - 37 seconds Final  . ABO/RH(D) 07/04/2015 B NEG   Final  . Antibody Screen 07/04/2015 NEG   Final  . Sample Expiration 07/04/2015 07/13/2015   Final  . Extend  sample reason 07/04/2015 NO TRANSFUSIONS OR PREGNANCY IN THE PAST 3 MONTHS   Final     X-Rays:Dg Chest 2 View  07/04/2015  CLINICAL DATA:  Preoperative right hip replacement. EXAM: CHEST  2 VIEW COMPARISON:  02/11/2015 FINDINGS: Cardiomediastinal silhouette is normal. Mediastinal contours appear intact. There is no evidence of focal airspace consolidation, pleural effusion or pneumothorax. Osseous structures are without acute abnormality. Soft tissues are grossly normal. Partially visualized lower cervical spine fusion hardware. IMPRESSION: No active cardiopulmonary disease. Electronically Signed   By: Fidela Salisbury M.D.   On: 07/04/2015 19:10   Dg Pelvis Portable  07/10/2015  CLINICAL DATA:  Revision of the right hip replacement. EXAM: PORTABLE PELVIS 1-2 VIEWS COMPARISON:  Partial visualization on lumbar spine radiographs 02/21/2015 FINDINGS: Hip revision on the right, now with several screws extending into the iliac bone. Degree of protrusio again noted. No discernible operative complication on this 1 projection. IMPRESSION: Revision of right total hip. Electronically Signed   By: Nelson Chimes M.D.   On: 07/10/2015 19:29    EKG: Orders placed or performed during the hospital encounter of 07/04/15  . EKG 12-Lead  . EKG 12-Lead     Hospital Course: Patient was admitted to [facility] and taken to the OR and underwent the above state procedure without complications.  Patient tolerated the procedure well and  was later transferred to the recovery room and then to the orthopaedic floor for postoperative care.  They were given PO and IV analgesics for pain control following their surgery.  They were given 24 hours of postoperative antibiotics of  Anti-infectives    Start     Dose/Rate Route Frequency Ordered Stop   07/11/15 0600  ceFAZolin (ANCEF) IVPB 2g/100 mL premix     2 g 200 mL/hr over 30 Minutes Intravenous On call to O.R. 07/10/15 1403 07/10/15 1703   07/10/15 2300  ceFAZolin  (ANCEF) IVPB 2g/100 mL premix     2 g 200 mL/hr over 30 Minutes Intravenous Every 6 hours 07/10/15 2042 07/11/15 0921     and started on DVT prophylaxis in the form of Xarelto.   PT and OT were ordered for total hip protocol.  The patient was allowed to be WBAT with therapy. Discharge planning was consulted to help with postop disposition and equipment needs.  Social worker consulted to assist with placement of the patient.  Patient had a decent night on the evening of surgery.  They started to get up OOB with therapy on day one and walked a few feet.  Hemovac drain was pulled without difficulty.  The knee immobilizer was removed and discontinued.  Continued to work with therapy into day two.  Dressing was changed on day two and the incision was healing well. Patient was seen in rounds and was ready to go to the SNF of choice.     Diet: Cardiac diet and Diabetic diet Activity:WBAT No bending hip over 90 degrees- A "L" Angle Do not cross legs Do not let foot roll inward When turning these patients a pillow should be placed between the patient's legs to prevent crossing. Patients should have the affected knee fully extended when trying to sit or stand from all surfaces to prevent excessive hip flexion. When ambulating and turning toward the affected side the affected leg should have the toes turned out prior to moving the walker and the rest of patient's body as to prevent internal rotation/ turning in of the leg. Abduction pillows are the most effective way to prevent a patient from not crossing legs or turning toes in at rest. If an abduction pillow is not ordered placing a regular pillow length wise between the patient's legs is also an effective reminder. It is imperative that these precautions be maintained so that the surgical hip does not dislocate. Follow-up:in 2 weeks Disposition - Skilled nursing facility Discharged Condition: improving DVT Prophylaxis - Xarelto  Discharge Instructions     Call MD / Call 911    Complete by:  As directed   If you experience chest pain or shortness of breath, CALL 911 and be transported to the hospital emergency room.  If you develope a fever above 101 F, pus (white drainage) or increased drainage or redness at the wound, or calf pain, call your surgeon's office.     Change dressing    Complete by:  As directed   You may change your dressing dressing daily with sterile 4 x 4 inch gauze dressing and paper tape.  Do not submerge the incision under water.     Constipation Prevention    Complete by:  As directed   Drink plenty of fluids.  Prune juice may be helpful.  You may use a stool softener, such as Colace (over the counter) 100 mg twice a day.  Use MiraLax (over the counter) for constipation as needed.  Diet - low sodium heart healthy    Complete by:  As directed      Diet Carb Modified    Complete by:  As directed      Discharge instructions    Complete by:  As directed   Pick up stool softner and laxative for home use following surgery while on pain medications. Do not submerge incision under water. Please use good hand washing techniques while changing dressing each day. May shower starting three days after surgery. Please use a clean towel to pat the incision dry following showers. Continue to use ice for pain and swelling after surgery. Do not use any lotions or creams on the incision until instructed by your surgeon. Hip precautions.  Total Hip Protocol.  Take Xarelto for two and a half more weeks, then discontinue Xarelto. Once the patient has completed the Xarelto, they may resume the 325 mg Aspirin daily.  Postoperative Constipation Protocol  Constipation - defined medically as fewer than three stools per week and severe constipation as less than one stool per week.  One of the most common issues patients have following surgery is constipation.  Even if you have a regular bowel pattern at home, your normal regimen is  likely to be disrupted due to multiple reasons following surgery.  Combination of anesthesia, postoperative narcotics, change in appetite and fluid intake all can affect your bowels.  In order to avoid complications following surgery, here are some recommendations in order to help you during your recovery period.  Colace (docusate) - Pick up an over-the-counter form of Colace or another stool softener and take twice a day as long as you are requiring postoperative pain medications.  Take with a full glass of water daily.  If you experience loose stools or diarrhea, hold the colace until you stool forms back up.  If your symptoms do not get better within 1 week or if they get worse, check with your doctor.  Dulcolax (bisacodyl) - Pick up over-the-counter and take as directed by the product packaging as needed to assist with the movement of your bowels.  Take with a full glass of water.  Use this product as needed if not relieved by Colace only.   MiraLax (polyethylene glycol) - Pick up over-the-counter to have on hand.  MiraLax is a solution that will increase the amount of water in your bowels to assist with bowel movements.  Take as directed and can mix with a glass of water, juice, soda, coffee, or tea.  Take if you go more than two days without a movement. Do not use MiraLax more than once per day. Call your doctor if you are still constipated or irregular after using this medication for 7 days in a row.  If you continue to have problems with postoperative constipation, please contact the office for further assistance and recommendations.  If you experience "the worst abdominal pain ever" or develop nausea or vomiting, please contact the office immediatly for further recommendations for treatment.  When discharged from the skilled rehab facility, please have the facility set up the patient's [place] prior to being released.  Please make sure this gets set up prior to release in  order to avoid any lapse of therapy following the rehab stay.  Also provide the patient with their medications at time of release from the facility to include their pain medication, the muscle relaxants, and their blood thinner medication.  If the patient is still at the rehab facility  at time of follow up appointment, please also assist the patient in arranging follow up appointment in our office and any transportation needs. ICE to the affected knee or hip every three hours for 30 minutes at a time and then as needed for pain and swelling.     Do not sit on low chairs, stoools or toilet seats, as it may be difficult to get up from low surfaces    Complete by:  As directed      Driving restrictions    Complete by:  As directed   No driving until released by the physician.     Follow the hip precautions as taught in Physical Therapy    Complete by:  As directed      Increase activity slowly as tolerated    Complete by:  As directed      Lifting restrictions    Complete by:  As directed   No lifting until released by the physician.     Patient may shower    Complete by:  As directed   You may shower without a dressing once there is no drainage.  Do not wash over the wound.  If drainage remains, do not shower until drainage stops.     TED hose    Complete by:  As directed   Use stockings (TED hose) for 3 weeks on both leg(s).  You may remove them at night for sleeping.     Weight bearing as tolerated    Complete by:  As directed   Laterality:  right  Extremity:  Lower            Medication List    STOP taking these medications        Alpha Lipoic Acid 200 MG Caps     aspirin EC 325 MG tablet     azithromycin 250 MG tablet  Commonly known as:  ZITHROMAX     calcium carbonate 1250 (500 Ca) MG tablet  Commonly known as:  OS-CAL - dosed in mg of elemental calcium     HYDROcodone-acetaminophen 5-325 MG tablet  Commonly known as:  NORCO/VICODIN  Replaced by:   HYDROcodone-acetaminophen 7.5-325 MG tablet     predniSONE 10 MG tablet  Commonly known as:  DELTASONE     PROBIOTIC DAILY PO     Vitamin D3 2000 units Tabs      TAKE these medications        acetaminophen 325 MG tablet  Commonly known as:  TYLENOL  Take 2 tablets (650 mg total) by mouth every 6 (six) hours as needed for mild pain (or Fever >/= 101).     bisacodyl 10 MG suppository  Commonly known as:  DULCOLAX  Place 1 suppository (10 mg total) rectally daily as needed for moderate constipation.     buPROPion 300 MG 24 hr tablet  Commonly known as:  WELLBUTRIN XL  Take 300 mg by mouth every morning.     clonazePAM 1 MG tablet  Commonly known as:  KLONOPIN  Take 1 tablet (1 mg total) by mouth daily.     DERMEND BRUISE FORMULA Crea  Apply 1 application topically daily. Apply to legs for 6 months  Started 02/14     diazepam 10 MG tablet  Commonly known as:  VALIUM  Take 10 mg by mouth every 6 (six) hours as needed for anxiety.     docusate sodium 100 MG capsule  Commonly known as:  COLACE  Take 100 mg by  mouth daily.     escitalopram 10 MG tablet  Commonly known as:  LEXAPRO  Take 10 mg by mouth daily.     fluticasone 50 MCG/ACT nasal spray  Commonly known as:  FLONASE  Place 1 spray into both nostrils 2 (two) times daily.     gabapentin 100 MG capsule  Commonly known as:  NEURONTIN  Take 100 mg by mouth 2 (two) times daily.     HYDROcodone-acetaminophen 7.5-325 MG tablet  Commonly known as:  NORCO  Take 1-2 tablets by mouth every 4 (four) hours as needed for moderate pain.     Lotion Base Lotn  Apply 1 application topically at bedtime. *Jergens Ultra Healing Lotion*     methocarbamol 500 MG tablet  Commonly known as:  ROBAXIN  Take 1 tablet (500 mg total) by mouth every 6 (six) hours as needed for muscle spasms.     metoCLOPramide 5 MG tablet  Commonly known as:  REGLAN  Take 1 tablet (5 mg total) by mouth every 8 (eight) hours as needed for nausea (if  ondansetron (ZOFRAN) ineffective.).     mometasone-formoterol 100-5 MCG/ACT Aero  Commonly known as:  DULERA  Inhale 2 puffs into the lungs 2 (two) times daily.     montelukast 10 MG tablet  Commonly known as:  SINGULAIR  Take 10 mg by mouth at bedtime.     olmesartan-hydrochlorothiazide 20-12.5 MG tablet  Commonly known as:  BENICAR HCT  Take 1 tablet by mouth daily.     ondansetron 4 MG tablet  Commonly known as:  ZOFRAN  Take 1 tablet (4 mg total) by mouth every 6 (six) hours as needed for nausea.     polyethylene glycol packet  Commonly known as:  MIRALAX / GLYCOLAX  Take 17 g by mouth daily as needed for mild constipation.     PROAIR HFA 108 (90 Base) MCG/ACT inhaler  Generic drug:  albuterol  Inhale 2 puffs into the lungs every 4 (four) hours as needed for shortness of breath.     rivaroxaban 10 MG Tabs tablet  Commonly known as:  XARELTO  Take 1 tablet (10 mg total) by mouth daily with breakfast. Take Xarelto for two and a half more weeks, then discontinue Xarelto. Once the patient has completed the Xarelto, they may resume the 325 mg Aspirin daily.     sodium phosphate 7-19 GM/118ML Enem  Place 133 mLs (1 enema total) rectally once as needed for severe constipation.     temazepam 30 MG capsule  Commonly known as:  RESTORIL  Take 1 capsule (30 mg total) by mouth at bedtime as needed.     traMADol 50 MG tablet  Commonly known as:  ULTRAM  Take 1 tablet (50 mg total) by mouth 4 (four) times daily as needed (for neuropathy).           Follow-up Information    Follow up with Gearlean Alf, MD. Schedule an appointment as soon as possible for a visit on 07/23/2015.   Specialty:  Orthopedic Surgery   Why:  Call office ASAP at [phone] to setup appointment on Tuesday 6.6.2017 with Dr. Wynelle Link.   Contact information:   [address] [place] [address] [phone]       Signed: Arlee Muslim, PA-C Orthopaedic Surgery 07/12/2015, 8:41  AM

## 2015-07-16 ENCOUNTER — Other Ambulatory Visit: Payer: Self-pay | Admitting: *Deleted

## 2015-07-17 ENCOUNTER — Ambulatory Visit: Payer: Medicare Other | Admitting: Oncology

## 2015-07-17 ENCOUNTER — Other Ambulatory Visit: Payer: Medicare Other

## 2015-07-17 ENCOUNTER — Telehealth: Payer: Self-pay | Admitting: Oncology

## 2015-07-17 NOTE — Telephone Encounter (Signed)
spoke w/ pt confirmed 7/13 apt

## 2015-08-28 ENCOUNTER — Other Ambulatory Visit: Payer: Self-pay | Admitting: *Deleted

## 2015-08-28 DIAGNOSIS — C50911 Malignant neoplasm of unspecified site of right female breast: Secondary | ICD-10-CM

## 2015-08-29 ENCOUNTER — Other Ambulatory Visit (HOSPITAL_BASED_OUTPATIENT_CLINIC_OR_DEPARTMENT_OTHER): Payer: Medicare Other

## 2015-08-29 ENCOUNTER — Ambulatory Visit (HOSPITAL_BASED_OUTPATIENT_CLINIC_OR_DEPARTMENT_OTHER): Payer: Medicare Other | Admitting: Oncology

## 2015-08-29 ENCOUNTER — Telehealth: Payer: Self-pay | Admitting: Oncology

## 2015-08-29 VITALS — BP 131/82 | HR 90 | Temp 98.9°F | Resp 20 | Ht 67.0 in | Wt 176.8 lb

## 2015-08-29 DIAGNOSIS — Z853 Personal history of malignant neoplasm of breast: Secondary | ICD-10-CM

## 2015-08-29 DIAGNOSIS — C50911 Malignant neoplasm of unspecified site of right female breast: Secondary | ICD-10-CM

## 2015-08-29 LAB — COMPREHENSIVE METABOLIC PANEL
ALT: 15 U/L (ref 0–55)
ANION GAP: 8 meq/L (ref 3–11)
AST: 19 U/L (ref 5–34)
Albumin: 3.6 g/dL (ref 3.5–5.0)
Alkaline Phosphatase: 136 U/L (ref 40–150)
BUN: 12 mg/dL (ref 7.0–26.0)
CALCIUM: 9.4 mg/dL (ref 8.4–10.4)
CO2: 26 meq/L (ref 22–29)
Chloride: 105 mEq/L (ref 98–109)
Creatinine: 0.7 mg/dL (ref 0.6–1.1)
EGFR: 80 mL/min/{1.73_m2} — AB (ref >90)
Glucose: 97 mg/dl (ref 70–140)
Potassium: 4.4 mEq/L (ref 3.5–5.1)
Sodium: 139 mEq/L (ref 136–145)
TOTAL PROTEIN: 6.6 g/dL (ref 6.4–8.3)
Total Bilirubin: 0.5 mg/dL (ref 0.20–1.20)

## 2015-08-29 LAB — CBC WITH DIFFERENTIAL/PLATELET
BASO%: 1.1 % (ref 0.0–2.0)
Basophils Absolute: 0.1 10*3/uL (ref 0.0–0.1)
EOS ABS: 0.2 10*3/uL (ref 0.0–0.5)
EOS%: 3.4 % (ref 0.0–7.0)
HCT: 35.3 % (ref 34.8–46.6)
HGB: 11.6 g/dL (ref 11.6–15.9)
LYMPH%: 11.7 % — AB (ref 14.0–49.7)
MCH: 32.7 pg (ref 25.1–34.0)
MCHC: 32.9 g/dL (ref 31.5–36.0)
MCV: 99.5 fL (ref 79.5–101.0)
MONO#: 0.6 10*3/uL (ref 0.1–0.9)
MONO%: 12 % (ref 0.0–14.0)
NEUT%: 71.8 % (ref 38.4–76.8)
NEUTROS ABS: 3.4 10*3/uL (ref 1.5–6.5)
PLATELETS: 308 10*3/uL (ref 145–400)
RBC: 3.55 10*6/uL — AB (ref 3.70–5.45)
RDW: 14.9 % — ABNORMAL HIGH (ref 11.2–14.5)
WBC: 4.8 10*3/uL (ref 3.9–10.3)
lymph#: 0.6 10*3/uL — ABNORMAL LOW (ref 0.9–3.3)

## 2015-08-29 MED ORDER — GABAPENTIN 300 MG PO CAPS: 300.0000 mg | ORAL_CAPSULE | Freq: Three times a day (TID) | ORAL | Status: DC

## 2015-08-29 NOTE — Telephone Encounter (Signed)
appt made and avs printed. [facility] appt 7/19 at 130 pm

## 2015-08-29 NOTE — Progress Notes (Signed)
ID: [name]   DOB: 09-Feb-1931  MR#: [id number]  PJK#:[id number]  PCP: Sheela Stack, MD GYN: SU:  OTHER MD:  BREAST CANCER HISTORY: From the original intake note:  [name] had a routine mammogram on 03-30-07 at [facility] which showed a possible mass at approximately 9:00 in the right breast.  On 04-05-03 she had spot compression views confirming a 1.5 cm. irregular mass at 9:00 and by ultrasound there really was no abnormality.  The mass was biopsied the same day and the final pathology ([id number]) showed an invasive mammary carcinoma that was estrogen and progesterone receptor positive, HER-2/neu negative.   On 04-13-03 [name] had bilateral breast MRI's which on the right showed only that single enhancing mass.  The left was fine.  With this information, she was referred to Dr. Osborn Coho and, after appropriate discussion, she proceeded to right lumpectomy and sentinel lymph node biopsy on 04-26-03.  The final pathology there showed a 1.4 cm. infiltrating ductal carcinoma with some lobular features, no evidence of lymphovascular invasion, negative margins, Grade 2 with zero of two lymph nodes involved.   Her subsequent treatment is as detailed below  INTERVAL HISTORY: [name] returns today for followup of her history of breast cancer. She is in a wheelchair today. She tells me at her senior residence she uses a walker, but when she is "out" she does not want to hang around and does not like to walk in area she is not familiar with.  She had been at [facility] but she tells me she was not getting enough attention there and also it became too expensive so she is now at [facility]. Even though she feels she is getting better service there there are still problems, and specifically the chief issue is that by the time she calls for help getting to the bathroom she doesn't get the help on time.  REVIEW OF SYSTEMS: [name] has pain in her back next hips and other joints. These are  not more intense or persistent than before. She had a mild sinus infection recently which is clearing. She is short of breath with activity. She has had occasionally productive cough. She has stress urinary incontinence. A detailed review of systems today was otherwise stable  PAST MEDICAL HISTORY: Past Medical History  Diagnosis Date  . Asthma   . Depression   . Hypertension   . Arthritis   . Diverticulosis of colon   . History of colon polyps   . Insomnia   . Breast cancer ([place]) 2006    right w/lumpectomy  . Endometrial hyperplasia 01/1996    fibroids, adenomyosis  . Spinal stenosis 2001  . Osteopenia 12/08    hip  . Vitamin D deficiency   . Diabetes ([place]) 2007  . Frequent UTI   Significant for remote tobacco abuse, the patient quitting more than 15 years ago.  She has mild COPD/asthma and some osteoarthritis. She has had some problems following a cervical laminectomy which was contaminated by staph.  She is status post right total hip replacement, status post total abdominal hysterectomy with bilateral salpingo-oophorectomy in 1998, status post cholecystectomy and status post tonsillectomy and adenoidectomy.  PAST SURGICAL HISTORY: Past Surgical History  Procedure Laterality Date  . Cervical fusion    . Cervical laminectomy  1975    x4  . Lumbar laminectomy  10/06    with spinal stenosis  . Cholecystectomy  11/00    lap chole  . Total hip arthroplasty Right 8/00  . Breast  lumpectomy Right 03/2003  . Manipulation of hip for dislocation    . Tonsillectomy and adenoidectomy    . Carpal tunnel release    . Tubal ligation Bilateral 1968  . Rotator cuff repair Right 11/96  . Hysteroscopy  10/97    [name], postmenopausal bleeding  . Total abdominal hysterectomy  12/97    endo hyperplasia, fibroids, adenomyosis  . Bilateral salpingoophorectomy  12/97  . Total knee arthroplasty Left 06/2003  . Total knee arthroplasty Right 10/06  . Total hip revision Right 07/10/2015     Procedure: RIGHT ACETABULAR VS TOTAL HIP REVISION;  Surgeon: Gaynelle Arabian, MD;  Location: [facility];  Service: Orthopedics;  Laterality: Right;    FAMILY HISTORY Family History  Problem Relation Age of Onset  . Diabetes Father   . Colon cancer Father 44  . Heart failure Father   . Diabetes Brother   . Diabetes Brother   . Arthritis Mother   . Thyroid disease Mother   . Osteoporosis Mother   . Osteoporosis Maternal Aunt   . Osteoporosis Maternal Grandmother   The patient's father died at the age of 17.  He had colon cancer but did not die from that.  The patient's mother also was 24 when she died.  The patient has two brothers.  There is no history of breast cancer in the family.  GYNECOLOGIC HISTORY: She had three live births, change of life occurred around 53 and she took hormone replacement until this diagnosis was made.   SOCIAL HISTORY: She is a Investment banker, operational.  Her husband, Mikki Santee, is a retired Museum/gallery exhibitions officer.  The patient's three children are Si Raider who is an esthetician (does depilation and similar procedures), a son [name] who lives in [place] with two children, and a daughter Jana Half who lives in [place].  The patient has three grandchildren.    ADVANCED DIRECTIVES: In place  HEALTH MAINTENANCE: Social History  Substance Use Topics  . Smoking status: Former Smoker    Quit date: 04/24/1990  . Smokeless tobacco: Never Used  . Alcohol Use: 2.4 oz/week    4 Glasses of wine per week     Comment: none in 6 months     Colonoscopy:  PAP:  Bone density:  Lipid panel:  Allergies  Allergen Reactions  . Oxycodone Hcl [Oxycodone Hcl] Rash  . Morphine Sulfate Other (See Comments)    REACTION: very anxious  . Sulfamethoxazole Other (See Comments)    REACTION: unspecified    Current Outpatient Prescriptions  Medication Sig Dispense Refill  . acetaminophen (TYLENOL) 325 MG tablet Take 2 tablets (650 mg total) by mouth every 6 (six) hours as needed for  mild pain (or Fever >/= 101). 20 tablet 0  . albuterol (PROAIR HFA) 108 (90 BASE) MCG/ACT inhaler Inhale 2 puffs into the lungs every 4 (four) hours as needed for shortness of breath.     Marland Kitchen buPROPion (WELLBUTRIN XL) 300 MG 24 hr tablet Take 300 mg by mouth every morning.     . clonazePAM (KLONOPIN) 1 MG tablet Take 1 tablet (1 mg total) by mouth daily. (Patient taking differently: Take 1 mg by mouth daily as needed (for sleep). ) 30 tablet 5  . docusate sodium (COLACE) 100 MG capsule Take 100 mg by mouth daily.    . Emollient (DERMEND BRUISE FORMULA) CREA Apply 1 application topically daily. Apply to legs for 6 months  Started 02/14    . escitalopram (LEXAPRO) 10 MG tablet Take 10 mg by  mouth daily.     . fluticasone (FLONASE) 50 MCG/ACT nasal spray Place 1 spray into both nostrils 2 (two) times daily.    Marland Kitchen gabapentin (NEURONTIN) 100 MG capsule Take 100-300 mg by mouth 2 (two) times daily.    Marland Kitchen gabapentin (NEURONTIN) 300 MG capsule Take 1 capsule (300 mg total) by mouth 3 (three) times daily. 30 capsule 6  . HYDROcodone-acetaminophen (NORCO) 7.5-325 MG tablet Take 1-2 tablets by mouth every 4 (four) hours as needed for moderate pain. 90 tablet 0  . Lotion Base LOTN Apply 1 application topically at bedtime. *Jergens Ultra Healing Lotion*    . methocarbamol (ROBAXIN) 500 MG tablet Take 1 tablet (500 mg total) by mouth every 6 (six) hours as needed for muscle spasms. 90 tablet 0  . mometasone-formoterol (DULERA) 100-5 MCG/ACT AERO Inhale 2 puffs into the lungs 2 (two) times daily.    . montelukast (SINGULAIR) 10 MG tablet Take 10 mg by mouth at bedtime.      Marland Kitchen olmesartan-hydrochlorothiazide (BENICAR HCT) 20-12.5 MG per tablet Take 1 tablet by mouth daily.      . polyethylene glycol (MIRALAX / GLYCOLAX) packet Take 17 g by mouth daily as needed for mild constipation. 14 each 0  . rivaroxaban (XARELTO) 10 MG TABS tablet Take 1 tablet (10 mg total) by mouth daily with breakfast. Take Xarelto for two and  a half more weeks, then discontinue Xarelto. Once the patient has completed the Xarelto, they may resume the 325 mg Aspirin daily. 19 tablet 0  . sodium phosphate (FLEET) 7-19 GM/118ML ENEM Place 133 mLs (1 enema total) rectally once as needed for severe constipation. 2 Bottle 0  . temazepam (RESTORIL) 30 MG capsule Take 1 capsule (30 mg total) by mouth at bedtime as needed. (Patient not taking: Reported on 07/03/2015) 30 capsule 5   No current facility-administered medications for this visit.    OBJECTIVE: Elderly white woman examined in a wheelchair    Filed Vitals:   08/29/15 1049  BP: 131/82  Pulse: 90  Temp: 98.9 F (37.2 English)  Resp: 20     Body mass index is 27.68 kg/(m^2).    ECOG FS: 1  Sclerae unicteric, EOMs intact Oropharynx clear and slightly dry No cervical or supraclavicular adenopathy Lungs no rales or rhonchi Heart regular rate and rhythm Abd soft, nontender, positive bowel sounds MSK no focal spinal tenderness, no upper extremity lymphedema Neuro: nonfocal, well oriented, appropriate affect Breasts: The right breast is status post lumpectomy followed by MammoSite radiation. There is no evidence of local recurrence. The right axilla is benign. The left breast is unremarkable.   LAB RESULTS: Lab Results  Component Value Date   WBC 4.8 08/29/2015   NEUTROABS 3.4 08/29/2015   HGB 11.6 08/29/2015   HCT 35.3 08/29/2015   MCV 99.5 08/29/2015   PLT 308 08/29/2015      Chemistry      Component Value Date/Time   NA 139 08/29/2015 1032   NA 139 07/12/2015 0358   K 4.4 08/29/2015 1032   K 3.9 07/12/2015 0358   CL 106 07/12/2015 0358   CL 97* 04/27/2012 1442   CO2 26 08/29/2015 1032   CO2 30 07/12/2015 0358   BUN 12.0 08/29/2015 1032   BUN 12 07/12/2015 0358   CREATININE 0.7 08/29/2015 1032   CREATININE 0.66 07/12/2015 0358      Component Value Date/Time   CALCIUM 9.4 08/29/2015 1032   CALCIUM 8.8* 07/12/2015 0358   ALKPHOS 136 08/29/2015 1032  ALKPHOS  161* 07/04/2015 1430   AST 19 08/29/2015 1032   AST 25 07/04/2015 1430   ALT 15 08/29/2015 1032   ALT 20 07/04/2015 1430   BILITOT 0.50 08/29/2015 1032   BILITOT 0.4 07/04/2015 1430       Lab Results  Component Value Date   LABCA2 13 07/24/2010    No components found for: [id number]  No results for input(s): INR in the last 168 hours.  Urinalysis    Component Value Date/Time   COLORURINE YELLOW 07/10/2015 [place] 07/10/2015 1715   LABSPEC 1.007 07/10/2015 1715   PHURINE 7.5 07/10/2015 1715   GLUCOSEU NEGATIVE 07/10/2015 1715   HGBUR NEGATIVE 07/10/2015 1715   BILIRUBINUR NEGATIVE 07/10/2015 1715   BILIRUBINUR n 09/25/2010 1525   KETONESUR NEGATIVE 07/10/2015 1715   PROTEINUR NEGATIVE 07/10/2015 1715   PROTEINUR n 09/25/2010 1525   UROBILINOGEN 0.2 09/25/2010 1525   UROBILINOGEN 0.2 01/04/2007 0900   NITRITE NEGATIVE 07/10/2015 1715   NITRITE n 09/25/2010 1525   LEUKOCYTESUR NEGATIVE 07/10/2015 1715    STUDIES: CLINICAL DATA: Revision of the right hip replacement.  EXAM: PORTABLE PELVIS 1-2 VIEWS  COMPARISON: Partial visualization on lumbar spine radiographs 02/21/2015  FINDINGS: Hip revision on the right, now with several screws extending into the iliac bone. Degree of protrusio again noted. No discernible operative complication on this 1 projection.  IMPRESSION: Revision of right total hip.   Electronically Signed  By: Nelson Chimes M.D.  On: 07/10/2015 19:29  ASSESSMENT: 81 y.o. [place] woman status post right lumpectomy and sentinel lymph node dissection March 2005 for a T1c N0, stage 1A invasive ductal carcinoma, grade 2,  estrogen and progesterone receptor positive, HER-2 negative  (a) status post MammoSite radiotherapy  (b) rceived adjuvant aromatase inhibitors (initially exemestane, later letrozole) between April of 2005 and June of 2011.  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 As  far as breast cancer is concerned, she is now 13 years out from definitive surgery with no evidence of disease recurrence. This is very favorable.  At her request she will continue to be seen here in a yearly basis. She understands there is no evidence of cancer at present and that from that point of view she has an excellent prognosis.  With all the changes she is undergoing she has fallen behind on her mammography. I have entered those orders for her to have the test done next week.  She will see me again in one year. She knows to call for any problems that may develop before that visit.   Mahrukh Seguin English    08/29/2015

## 2015-09-24 ENCOUNTER — Encounter: Payer: Self-pay | Admitting: Oncology

## 2015-10-02 ENCOUNTER — Other Ambulatory Visit: Payer: Self-pay | Admitting: *Deleted

## 2015-10-02 DIAGNOSIS — C50911 Malignant neoplasm of unspecified site of right female breast: Secondary | ICD-10-CM

## 2015-10-03 ENCOUNTER — Ambulatory Visit: Payer: Medicare Other | Admitting: Oncology

## 2015-10-03 ENCOUNTER — Other Ambulatory Visit: Payer: Medicare Other

## 2015-10-03 NOTE — Progress Notes (Signed)
[name] did no show today but I believe this appointment was made in per. She was supposed to see me in one year not one month. I am rescheduling her for August of next year

## 2015-11-14 ENCOUNTER — Telehealth: Payer: Self-pay | Admitting: *Deleted

## 2015-11-14 DIAGNOSIS — C50111 Malignant neoplasm of central portion of right female breast: Secondary | ICD-10-CM

## 2015-11-14 MED ORDER — GABAPENTIN 300 MG PO CAPS: 300.0000 mg | ORAL_CAPSULE | Freq: Three times a day (TID) | ORAL | 3 refills | Status: DC

## 2015-11-14 NOTE — Telephone Encounter (Signed)
Call from [place] in reference to faxed [place] refill request sent on 11-12-2015.  she need an increase in quantity and refill authorization.  Provider's nurse confirmed facsimile receipt.  Authorized refill.  Next F/u will be scheduled for a date in July 2018.

## 2015-11-27 ENCOUNTER — Encounter (HOSPITAL_COMMUNITY): Payer: Medicare Other

## 2015-11-28 ENCOUNTER — Emergency Department (HOSPITAL_COMMUNITY): Payer: Medicare Other

## 2015-11-28 ENCOUNTER — Emergency Department (HOSPITAL_COMMUNITY)
Admission: EM | Admit: 2015-11-28 | Discharge: 2015-11-29 | Disposition: A | Discharge status: Home / Self Care | Payer: Medicare Other | Attending: Emergency Medicine | Admitting: Emergency Medicine

## 2015-11-28 ENCOUNTER — Encounter (HOSPITAL_COMMUNITY): Payer: Medicare Other

## 2015-11-28 ENCOUNTER — Encounter (HOSPITAL_COMMUNITY): Payer: Self-pay | Admitting: Emergency Medicine

## 2015-11-28 DIAGNOSIS — Y92009 Unspecified place in unspecified non-institutional (private) residence as the place of occurrence of the external cause: Secondary | ICD-10-CM | POA: Insufficient documentation

## 2015-11-28 DIAGNOSIS — E119 Type 2 diabetes mellitus without complications: Secondary | ICD-10-CM | POA: Diagnosis not present

## 2015-11-28 DIAGNOSIS — J449 Chronic obstructive pulmonary disease, unspecified: Secondary | ICD-10-CM | POA: Insufficient documentation

## 2015-11-28 DIAGNOSIS — Z79899 Other long term (current) drug therapy: Secondary | ICD-10-CM | POA: Insufficient documentation

## 2015-11-28 DIAGNOSIS — Z87891 Personal history of nicotine dependence: Secondary | ICD-10-CM | POA: Insufficient documentation

## 2015-11-28 DIAGNOSIS — I10 Essential (primary) hypertension: Secondary | ICD-10-CM | POA: Diagnosis not present

## 2015-11-28 DIAGNOSIS — S0101XA Laceration without foreign body of scalp, initial encounter: Secondary | ICD-10-CM

## 2015-11-28 DIAGNOSIS — J45909 Unspecified asthma, uncomplicated: Secondary | ICD-10-CM | POA: Diagnosis not present

## 2015-11-28 DIAGNOSIS — N390 Urinary tract infection, site not specified: Secondary | ICD-10-CM

## 2015-11-28 DIAGNOSIS — S0990XA Unspecified injury of head, initial encounter: Secondary | ICD-10-CM | POA: Diagnosis present

## 2015-11-28 DIAGNOSIS — Y999 Unspecified external cause status: Secondary | ICD-10-CM | POA: Insufficient documentation

## 2015-11-28 DIAGNOSIS — Y9301 Activity, walking, marching and hiking: Secondary | ICD-10-CM | POA: Insufficient documentation

## 2015-11-28 DIAGNOSIS — W01190A Fall on same level from slipping, tripping and stumbling with subsequent striking against furniture, initial encounter: Secondary | ICD-10-CM | POA: Diagnosis not present

## 2015-11-28 DIAGNOSIS — W19XXXA Unspecified fall, initial encounter: Secondary | ICD-10-CM

## 2015-11-28 DIAGNOSIS — Z853 Personal history of malignant neoplasm of breast: Secondary | ICD-10-CM | POA: Diagnosis not present

## 2015-11-28 HISTORY — DX: Chronic obstructive pulmonary disease, unspecified: J44.9

## 2015-11-28 LAB — URINE MICROSCOPIC-ADD ON: RBC / HPF: NONE SEEN RBC/hpf (ref 0–5)

## 2015-11-28 LAB — URINALYSIS, ROUTINE W REFLEX MICROSCOPIC
Bilirubin Urine: NEGATIVE
Glucose, UA: NEGATIVE mg/dL
KETONES UR: NEGATIVE mg/dL
NITRITE: NEGATIVE
PROTEIN: NEGATIVE mg/dL
Specific Gravity, Urine: 1.012 (ref 1.005–1.030)
pH: 7 (ref 5.0–8.0)

## 2015-11-28 MED ORDER — LIDOCAINE-EPINEPHRINE-TETRACAINE (LET) SOLUTION
3.0000 mL | Freq: Once | NASAL | Status: AC
  Administered 2015-11-28: 3 mL via TOPICAL
  Filled 2015-11-28: qty 3

## 2015-11-28 MED ORDER — PROPYLENE GLYCOL-GLYCERIN 0.6-0.6 % OP SOLN: 1.0000 [drp] | Freq: Two times a day (BID) | OPHTHALMIC | 0 refills | Status: AC

## 2015-11-28 MED ORDER — CEPHALEXIN 500 MG PO CAPS: 500.0000 mg | ORAL_CAPSULE | Freq: Three times a day (TID) | ORAL | 0 refills | Status: DC

## 2015-11-28 MED ORDER — CEPHALEXIN 500 MG PO CAPS
500.0000 mg | ORAL_CAPSULE | Freq: Three times a day (TID) | ORAL | 0 refills | Status: DC
Start: 2015-11-28 — End: 2016-05-08

## 2015-11-28 NOTE — ED Provider Notes (Signed)
[facility] DEPT Provider Note   CSN: [id number] Arrival date & time: 11/28/15  2033  By signing my name below, I, Royce Macadamia, attest that this documentation has been prepared under the direction and in the presence of  Domenic Moras, PA-C. Electronically Signed: Royce Macadamia, ED Scribe. 11/28/15. 8:56 PM.  History   Chief Complaint Chief Complaint  Patient presents with  . Fall  . Head Laceration   The history is provided by the patient and medical records. No language interpreter was used.     HPI Comments:  [name] is a 82 y.o. female who presents to the Emergency Department s/p fall 30 minutes ago complaining of a laceration to the occiput of her scalp. Pt was using her walker when she "lost concentration" while standing still which caused her to loose her balance and fall backwards hitting her head on a bookshelf.  She reports that her pain is a 3/10.  Pt used to fall frequently, but hasn't fallen in couple months.  She denies LOC, dysuria, hematuria, increased frequency of urination, difficulty urinating, confusion, nausea, chest pain, trouble breathing and taking blood thinners.  Tetanus UTD. She is allergic to morphine and sulfamethoxazole.   Pt also complains of stiff neck for a month and a half.  She is seeing a neurologist for this complaint.    Past Medical History:  Diagnosis Date  . Arthritis   . Asthma   . Breast cancer ([place]) 2006   right w/lumpectomy  . COPD (chronic obstructive pulmonary disease) ([place])   . Depression   . Diabetes ([place]) 2007  . Diverticulosis of colon   . Endometrial hyperplasia 01/1996   fibroids, adenomyosis  . Frequent UTI   . History of colon polyps   . Hypertension   . Insomnia   . Osteopenia 12/08   hip  . Spinal stenosis 2001  . Vitamin D deficiency     Patient Active Problem List   Diagnosis Date Noted  . 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 Breast cancer, right breast ([place]) 07/18/2014  .  CONCUSSION WITH LOC OF UNSPECIFIED DURATION 07/19/2009  . OTH SYMPTOMS INVOLVING RESPIRATORY SYSTEM&CHEST 11/12/2008  . UNSPECIFIED DISORDER OF ANKLE AND FOOT JOINT 05/11/2008  . CERUMEN IMPACTION, BILATERAL 05/04/2008  . DYSMETABOLIC SYNDROME X [id number]  . UNS ADVRS EFF UNS RX MEDICINAL&BIOLOGICAL SBSTNC 08/12/2007  . Other and unspecified hyperlipidemia 04/01/2007  . INSOMNIA, PERSISTENT 09/09/2006  . DEPRESSION 09/09/2006  . HYPERTENSION 09/09/2006  . Asthma 09/09/2006  . DIVERTICULOSIS, COLON 09/09/2006  . OSTEOARTHRITIS 09/09/2006  . STENOSIS, LUMBAR SPINE 09/09/2006  . COLONIC POLYPS, HX OF 09/09/2006    Past Surgical History:  Procedure Laterality Date  . BILATERAL SALPINGOOPHORECTOMY  12/97  . BREAST LUMPECTOMY Right 03/2003  . CARPAL TUNNEL RELEASE    . CERVICAL FUSION    . CERVICAL LAMINECTOMY  1975   x4  . CHOLECYSTECTOMY  11/00   lap chole  . HYSTEROSCOPY  10/97   D&C, postmenopausal bleeding  . LUMBAR LAMINECTOMY  10/06   with spinal stenosis  . manipulation of hip for dislocation    . ROTATOR CUFF REPAIR Right 11/96  . TONSILLECTOMY AND ADENOIDECTOMY    . TOTAL ABDOMINAL HYSTERECTOMY  12/97   endo hyperplasia, fibroids, adenomyosis  . TOTAL HIP ARTHROPLASTY Right 8/00  . TOTAL HIP REVISION Right 07/10/2015   Procedure: RIGHT ACETABULAR VS TOTAL HIP REVISION;  Surgeon: Gaynelle Arabian, MD;  Location: [facility] ORS;  Service: Orthopedics;  Laterality: Right;  .  TOTAL KNEE ARTHROPLASTY Left 06/2003  . TOTAL KNEE ARTHROPLASTY Right 10/06  . TUBAL LIGATION Bilateral 1968    OB History    Gravida Para Term Preterm AB Living   3 3 3          SAB TAB Ectopic Multiple Live Births                   Home Medications    Prior to Admission medications   Medication Sig Start Date End Date Taking? Authorizing Provider  acetaminophen (TYLENOL) 325 MG tablet Take 2 tablets (650 mg total) by mouth every 6 (six) hours as needed for mild pain (or Fever >/= 101). 07/12/15   Arlee Muslim, PA-C  albuterol (PROAIR HFA) 108 (90 BASE) MCG/ACT inhaler Inhale 2 puffs into the lungs every 4 (four) hours as needed for shortness of breath.     Historical Provider, MD  buPROPion (WELLBUTRIN XL) 300 MG 24 hr tablet Take 300 mg by mouth every morning.     Historical Provider, MD  clonazePAM (KLONOPIN) 1 MG tablet Take 1 tablet (1 mg total) by mouth daily. Patient taking differently: Take 1 mg by mouth daily as needed (for sleep).  06/03/10   Ricard Dillon, MD  docusate sodium (COLACE) 100 MG capsule Take 100 mg by mouth daily.    Historical Provider, MD  Emollient (DERMEND BRUISE FORMULA) CREA Apply 1 application topically daily. Apply to legs for 6 months  Started 02/14    Historical Provider, MD  escitalopram (LEXAPRO) 10 MG tablet Take 10 mg by mouth daily.  04/20/11   Historical Provider, MD  fluticasone (FLONASE) 50 MCG/ACT nasal spray Place 1 spray into both nostrils 2 (two) times daily.    Historical Provider, MD  gabapentin (NEURONTIN) 100 MG capsule Take 100-300 mg by mouth 2 (two) times daily.    Historical Provider, MD  gabapentin (NEURONTIN) 300 MG capsule Take 1 capsule (300 mg total) by mouth 3 (three) times daily. 11/14/15   Chauncey Cruel, MD  HYDROcodone-acetaminophen (NORCO) 7.5-325 MG tablet Take 1-2 tablets by mouth every 4 (four) hours as needed for moderate pain. 07/12/15   Arlee Muslim, PA-C  Lotion Base LOTN Apply 1 application topically at bedtime. *Jergens Ultra Healing Lotion*    Historical Provider, MD  methocarbamol (ROBAXIN) 500 MG tablet Take 1 tablet (500 mg total) by mouth every 6 (six) hours as needed for muscle spasms. 07/12/15   Arlee Muslim, PA-C  mometasone-formoterol (DULERA) 100-5 MCG/ACT AERO Inhale 2 puffs into the lungs 2 (two) times daily.    Historical Provider, MD  montelukast (SINGULAIR) 10 MG tablet Take 10 mg by mouth at bedtime.      Historical Provider, MD  olmesartan-hydrochlorothiazide (BENICAR HCT) 20-12.5 MG per tablet Take 1 tablet  by mouth daily.      Historical Provider, MD  polyethylene glycol (MIRALAX / GLYCOLAX) packet Take 17 g by mouth daily as needed for mild constipation. 07/12/15   Arlee Muslim, PA-C  rivaroxaban (XARELTO) 10 MG TABS tablet Take 1 tablet (10 mg total) by mouth daily with breakfast. Take Xarelto for two and a half more weeks, then discontinue Xarelto. Once the patient has completed the Xarelto, they may resume the 325 mg Aspirin daily. 07/12/15   Arlee Muslim, PA-C  sodium phosphate (FLEET) 7-19 GM/118ML ENEM Place 133 mLs (1 enema total) rectally once as needed for severe constipation. 07/12/15   Arlee Muslim, PA-C  temazepam (RESTORIL) 30 MG capsule Take 1 capsule (30 mg  total) by mouth at bedtime as needed. Patient not taking: Reported on 07/03/2015 06/03/10   Ricard Dillon, MD    Family History Family History  Problem Relation Age of Onset  . Diabetes Father   . Colon cancer Father 31  . Heart failure Father   . Arthritis Mother   . Thyroid disease Mother   . Osteoporosis Mother   . Diabetes Brother   . Diabetes Brother   . Osteoporosis Maternal Aunt   . Osteoporosis Maternal Grandmother     Social History Social History  Substance Use Topics  . Smoking status: Former Smoker    Quit date: 04/24/1990  . Smokeless tobacco: Never Used  . Alcohol use 2.4 oz/week    4 Glasses of wine per week     Comment: occ     Allergies   Oxycodone hcl [oxycodone hcl]; Morphine sulfate; and Sulfamethoxazole   Review of Systems Review of Systems  Respiratory: Negative for shortness of breath.   Cardiovascular: Negative for chest pain.  Gastrointestinal: Negative for nausea.  Genitourinary: Negative for difficulty urinating, dysuria, frequency and hematuria.  Musculoskeletal: Positive for neck stiffness.  Skin: Positive for wound.  Neurological: Negative for syncope.  Psychiatric/Behavioral: Negative for confusion.  All other systems reviewed and are negative.    Physical Exam Updated  Vital Signs BP 124/72 (BP Location: Left Arm)   Pulse 70   Temp 97.9 F (36.6 C) (Oral)   Resp 19   SpO2 95%   Physical Exam  Constitutional: She is oriented to person, place, and time. She appears well-developed and well-nourished. No distress.  HENT:  Head: Normocephalic and atraumatic.    No hemotympanum. No battle signs  Eyes: Conjunctivae are normal.  Cardiovascular: Normal rate.   Pulmonary/Chest: Effort normal.  Musculoskeletal:  No cervical spine tenderness.    Neurological: She is alert and oriented to person, place, and time.  Skin: Skin is warm and dry.  1cm laceration to the occiput of the scalp.  Not actively bleeding. Mild tenderness to palpation.  Psychiatric: She has a normal mood and affect.  Nursing note and vitals reviewed.   ED Treatments / Results    DIAGNOSTIC STUDIES:  Oxygen Saturation is 95% on RA, NML by my interpretation.    COORDINATION OF CARE:  8:54 PM Discussed treatment plan with pt at bedside and pt agreed to plan.  Labs (all labs ordered are listed, but only abnormal results are displayed) Labs Reviewed  URINALYSIS, ROUTINE W REFLEX MICROSCOPIC (NOT AT [facility]) - Abnormal; Notable for the following:       Result Value   APPearance TURBID (*)    Hgb urine dipstick MODERATE (*)    Leukocytes, UA LARGE (*)    All other components within normal limits  URINE MICROSCOPIC-ADD ON - Abnormal; Notable for the following:    Squamous Epithelial / LPF 0-5 (*)    Bacteria, UA FEW (*)    All other components within normal limits    EKG  EKG Interpretation None      Date: 11/28/2015  Rate: 80  Rhythm: normal sinus rhythm  QRS Axis: normal  Intervals: normal  ST/T Wave abnormalities: normal  Conduction Disutrbances: none  Narrative Interpretation:   Old EKG Reviewed: No significant changes noted     Radiology Ct Head Wo Contrast  Result Date: 11/28/2015 CLINICAL DATA:  Trip and fall using walker with laceration to back of head.  No loss of consciousness. EXAM: CT HEAD WITHOUT CONTRAST CT CERVICAL SPINE  WITHOUT CONTRAST TECHNIQUE: Multidetector CT imaging of the head and cervical spine was performed following the standard protocol without intravenous contrast. Multiplanar CT image reconstructions of the cervical spine were also generated. COMPARISON:  Head CT 07/19/2009 FINDINGS: CT HEAD FINDINGS Brain: Progressive atrophy and chronic small vessel ischemia. No intracranial hemorrhage, mass effect or midline shift. No CT findings of acute ischemia. No subdural or extra-axial fluid collection. Vascular: Atherosclerosis of skullbase vasculature without hyperdense vessel. Skull: No fracture.  Left parietal scalp hematoma. Sinuses/Orbits: No acute finding. Other: None. CT CERVICAL SPINE FINDINGS Alignment: Straightening of normal lordosis. Minimal anterolisthesis of C7 on T1 appears degenerative. Skull base and vertebrae: Anterior fusion C4 through C7 with interbody spacers. Posterior fusion with cerclage wires at same levels. The dens is intact with degenerative change of the atlantodental articulation. No acute fracture. Skullbase is intact. Soft tissues and spinal canal: No prevertebral fluid or swelling. No visible canal hematoma. Disc levels: Disc space narrowing at C3-C4, advanced. Disc space narrowing and CT C3 and C7-T1. Postsurgical change in the midcervical spine with intact hardware. Upper chest: No acute abnormality. Other: None. IMPRESSION: 1. Left posterior scalp hematoma without calvarial fracture or acute intracranial abnormality. 2. Degenerative and postsurgical change in the cervical spine without acute fracture or subluxation. Electronically Signed   By: Jeb Levering M.D.   On: 11/28/2015 22:18   Ct Cervical Spine Wo Contrast  Result Date: 11/28/2015 CLINICAL DATA:  Trip and fall using walker with laceration to back of head. No loss of consciousness. EXAM: CT HEAD WITHOUT CONTRAST CT CERVICAL SPINE WITHOUT CONTRAST  TECHNIQUE: Multidetector CT imaging of the head and cervical spine was performed following the standard protocol without intravenous contrast. Multiplanar CT image reconstructions of the cervical spine were also generated. COMPARISON:  Head CT 07/19/2009 FINDINGS: CT HEAD FINDINGS Brain: Progressive atrophy and chronic small vessel ischemia. No intracranial hemorrhage, mass effect or midline shift. No CT findings of acute ischemia. No subdural or extra-axial fluid collection. Vascular: Atherosclerosis of skullbase vasculature without hyperdense vessel. Skull: No fracture.  Left parietal scalp hematoma. Sinuses/Orbits: No acute finding. Other: None. CT CERVICAL SPINE FINDINGS Alignment: Straightening of normal lordosis. Minimal anterolisthesis of C7 on T1 appears degenerative. Skull base and vertebrae: Anterior fusion C4 through C7 with interbody spacers. Posterior fusion with cerclage wires at same levels. The dens is intact with degenerative change of the atlantodental articulation. No acute fracture. Skullbase is intact. Soft tissues and spinal canal: No prevertebral fluid or swelling. No visible canal hematoma. Disc levels: Disc space narrowing at C3-C4, advanced. Disc space narrowing and CT C3 and C7-T1. Postsurgical change in the midcervical spine with intact hardware. Upper chest: No acute abnormality. Other: None. IMPRESSION: 1. Left posterior scalp hematoma without calvarial fracture or acute intracranial abnormality. 2. Degenerative and postsurgical change in the cervical spine without acute fracture or subluxation. Electronically Signed   By: Jeb Levering M.D.   On: 11/28/2015 22:18    Procedures Procedures (including critical care time)  LACERATION REPAIR Performed by: Domenic Moras Authorized by: Domenic Moras Consent: Verbal consent obtained. Risks and benefits: risks, benefits and alternatives were discussed Consent given by: patient Patient identity confirmed: provided demographic  data Prepped and Draped in normal sterile fashion Wound explored  Laceration Location: posterior scalp  Laceration Length: 1cm  No Foreign Bodies seen or palpated  Anesthesia: local infiltration  Local anesthetic: LET  Anesthetic total: 3 ml  Irrigation method: syringe Amount of cleaning: standard  Skin closure: surgical staples  Number of staples: 2  Technique: surgical staples  Patient tolerance: Patient tolerated the procedure well with no immediate complications.   Medications Ordered in ED Medications  lidocaine-EPINEPHrine-tetracaine (LET) solution (not administered)     Initial Impression / Assessment and Plan / ED Course  I have reviewed the triage vital signs and the nursing notes.  Pertinent labs & imaging results that were available during my care of the patient were reviewed by me and considered in my medical decision making (see chart for details).  Clinical Course    BP 124/72 (BP Location: Left Arm)   Pulse 70   Temp 97.9 F (36.6 C) (Oral)   Resp 19   SpO2 95%    Final Clinical Impressions(s) / ED Diagnoses   Final diagnoses:  Laceration of scalp without foreign body, initial encounter  Fall in home, initial encounter  Urinary tract infection without hematuria, site unspecified    New Prescriptions New Prescriptions   CEPHALEXIN (KEFLEX) 500 MG CAPSULE    Take 1 capsule (500 mg total) by mouth 3 (three) times daily.   PROPYLENE GLYCOL-GLYCERIN (SOOTHE) 0.6-0.6 % SOLN    Apply 1 drop to eye 2 (two) times daily.   I personally performed the services described in this documentation, which was scribed in my presence. The recorded information has been reviewed and is accurate.     9:57 PM Pt had a mechanical fall, suffered a laceration to the occiput of her scalp. She is A&Ox3.  Scalp lac were cleansed.  Surgical staples applied.  Pt tolerates well.  Care discussed with Dr. Johnney Killian.    10:49 PM EKG without acute ischemic changes.  Ct  of head and cervical spine is unremarkable.  UA with evidence of UTI.  Pt admits to having some burning on urination.  Therefore, will treat with keflex.  Pt also requesting for soothing eyedrops, will prescribed.  Pt stable for discharge.  Able to ambulate.     Domenic Moras, PA-C 11/28/15 2252    Charlesetta Shanks, MD 11/30/15 [phone]

## 2015-11-28 NOTE — ED Notes (Signed)
Patient transported to CT 

## 2015-11-28 NOTE — ED Notes (Signed)
[facility] notified of need for transport back to [facility]

## 2015-11-28 NOTE — ED Triage Notes (Signed)
Pt brought in by EMS from the [facility] after she tripped using her walker and fell backward  Pt has a small laceration noted to the left side of the back of her head  Pt denies LOC or any other injury  Pt is alert and oriented x3  Bleeding is controlled

## 2015-11-28 NOTE — Discharge Instructions (Signed)
Please take tylenol at home as needed for pain. Take Keflex antibiotic as treatment for urinary tract infection. Keep wound dry for the first 18 hrs then you can wash and shampoo hair as usual.  Follow up with your doctor in 1 week for staples removal.

## 2015-12-04 ENCOUNTER — Other Ambulatory Visit (HOSPITAL_COMMUNITY): Payer: Self-pay | Admitting: *Deleted

## 2015-12-05 ENCOUNTER — Ambulatory Visit (HOSPITAL_COMMUNITY)
Admission: RE | Admit: 2015-12-05 | Discharge: 2015-12-05 | Disposition: A | Discharge status: Home / Self Care | Payer: Medicare Other | Source: Ambulatory Visit | Attending: Endocrinology | Admitting: Endocrinology

## 2015-12-05 DIAGNOSIS — M810 Age-related osteoporosis without current pathological fracture: Secondary | ICD-10-CM | POA: Insufficient documentation

## 2015-12-05 MED ORDER — DENOSUMAB 60 MG/ML ~~LOC~~ SOLN
60.0000 mg | Freq: Once | SUBCUTANEOUS | Status: AC
  Administered 2015-12-05: 60 mg via SUBCUTANEOUS
  Filled 2015-12-05: qty 1

## 2015-12-05 NOTE — Discharge Instructions (Signed)
Denosumab injection  What is this medicine?  DENOSUMAB (den oh sue mab) slows bone breakdown. Prolia is used to treat osteoporosis in women after menopause and in men. Xgeva is used to prevent bone fractures and other bone problems caused by cancer bone metastases. Xgeva is also used to treat giant cell tumor of the bone.  This medicine may be used for other purposes; ask your health care provider or pharmacist if you have questions.  What should I tell my health care provider before I take this medicine?  They need to know if you have any of these conditions:  -dental disease  -eczema  -infection or history of infections  -kidney disease or on dialysis  -low blood calcium or vitamin D  -malabsorption syndrome  -scheduled to have surgery or tooth extraction  -taking medicine that contains denosumab  -thyroid or parathyroid disease  -an unusual reaction to denosumab, other medicines, foods, dyes, or preservatives  -pregnant or trying to get pregnant  -breast-feeding  How should I use this medicine?  This medicine is for injection under the skin. It is given by a health care professional in a hospital or clinic setting.  If you are getting Prolia, a special MedGuide will be given to you by the pharmacist with each prescription and refill. Be sure to read this information carefully each time.  For Prolia, talk to your pediatrician regarding the use of this medicine in children. Special care may be needed. For Xgeva, talk to your pediatrician regarding the use of this medicine in children. While this drug may be prescribed for children as young as 13 years for selected conditions, precautions do apply.  Overdosage: If you think you have taken too much of this medicine contact a poison control center or emergency room at once.  NOTE: This medicine is only for you. Do not share this medicine with others.  What if I miss a dose?  It is important not to miss your dose. Call your doctor or health care professional if you are  unable to keep an appointment.  What may interact with this medicine?  Do not take this medicine with any of the following medications:  -other medicines containing denosumab  This medicine may also interact with the following medications:  -medicines that suppress the immune system  -medicines that treat cancer  -steroid medicines like prednisone or cortisone  This list may not describe all possible interactions. Give your health care provider a list of all the medicines, herbs, non-prescription drugs, or dietary supplements you use. Also tell them if you smoke, drink alcohol, or use illegal drugs. Some items may interact with your medicine.  What should I watch for while using this medicine?  Visit your doctor or health care professional for regular checks on your progress. Your doctor or health care professional may order blood tests and other tests to see how you are doing.  Call your doctor or health care professional if you get a cold or other infection while receiving this medicine. Do not treat yourself. This medicine may decrease your body's ability to fight infection.  You should make sure you get enough calcium and vitamin D while you are taking this medicine, unless your doctor tells you not to. Discuss the foods you eat and the vitamins you take with your health care professional.  See your dentist regularly. Brush and floss your teeth as directed. Before you have any dental work done, tell your dentist you are receiving this medicine.  Do   not become pregnant while taking this medicine or for 5 months after stopping it. Women should inform their doctor if they wish to become pregnant or think they might be pregnant. There is a potential for serious side effects to an unborn child. Talk to your health care professional or pharmacist for more information.  What side effects may I notice from receiving this medicine?  Side effects that you should report to your doctor or health care professional as soon as  possible:  -allergic reactions like skin rash, itching or hives, swelling of the face, lips, or tongue  -breathing problems  -chest pain  -fast, irregular heartbeat  -feeling faint or lightheaded, falls  -fever, chills, or any other sign of infection  -muscle spasms, tightening, or twitches  -numbness or tingling  -skin blisters or bumps, or is dry, peels, or red  -slow healing or unexplained pain in the mouth or jaw  -unusual bleeding or bruising  Side effects that usually do not require medical attention (Report these to your doctor or health care professional if they continue or are bothersome.):  -muscle pain  -stomach upset, gas  This list may not describe all possible side effects. Call your doctor for medical advice about side effects. You may report side effects to FDA at 1-800-FDA-1088.  Where should I keep my medicine?  This medicine is only given in a clinic, doctor's office, or other health care setting and will not be stored at home.  NOTE: This sheet is a summary. It may not cover all possible information. If you have questions about this medicine, talk to your doctor, pharmacist, or health care provider.      2016, [employer]vier/[employer] Standard. (2011-08-03 12:37:47)

## 2016-05-08 ENCOUNTER — Emergency Department (HOSPITAL_COMMUNITY): Payer: Medicare Other

## 2016-05-08 ENCOUNTER — Inpatient Hospital Stay (HOSPITAL_COMMUNITY)
Admission: EM | Admit: 2016-05-08 | Discharge: 2016-05-10 | DRG: 871 | Disposition: A | Discharge status: Home Health | Payer: Medicare Other | Attending: Internal Medicine | Admitting: Internal Medicine

## 2016-05-08 ENCOUNTER — Encounter (HOSPITAL_COMMUNITY): Payer: Self-pay | Admitting: *Deleted

## 2016-05-08 DIAGNOSIS — J45909 Unspecified asthma, uncomplicated: Secondary | ICD-10-CM | POA: Diagnosis not present

## 2016-05-08 DIAGNOSIS — F329 Major depressive disorder, single episode, unspecified: Secondary | ICD-10-CM | POA: Diagnosis present

## 2016-05-08 DIAGNOSIS — F419 Anxiety disorder, unspecified: Secondary | ICD-10-CM | POA: Diagnosis present

## 2016-05-08 DIAGNOSIS — Z888 Allergy status to other drugs, medicaments and biological substances status: Secondary | ICD-10-CM

## 2016-05-08 DIAGNOSIS — F32A Depression, unspecified: Secondary | ICD-10-CM | POA: Diagnosis present

## 2016-05-08 DIAGNOSIS — J440 Chronic obstructive pulmonary disease with acute lower respiratory infection: Secondary | ICD-10-CM | POA: Diagnosis present

## 2016-05-08 DIAGNOSIS — G8929 Other chronic pain: Secondary | ICD-10-CM | POA: Diagnosis present

## 2016-05-08 DIAGNOSIS — A419 Sepsis, unspecified organism: Principal | ICD-10-CM

## 2016-05-08 DIAGNOSIS — R6510 Systemic inflammatory response syndrome (SIRS) of non-infectious origin without acute organ dysfunction: Secondary | ICD-10-CM | POA: Diagnosis present

## 2016-05-08 DIAGNOSIS — Z7982 Long term (current) use of aspirin: Secondary | ICD-10-CM

## 2016-05-08 DIAGNOSIS — Z96653 Presence of artificial knee joint, bilateral: Secondary | ICD-10-CM | POA: Diagnosis present

## 2016-05-08 DIAGNOSIS — I10 Essential (primary) hypertension: Secondary | ICD-10-CM | POA: Diagnosis present

## 2016-05-08 DIAGNOSIS — Z96641 Presence of right artificial hip joint: Secondary | ICD-10-CM | POA: Diagnosis present

## 2016-05-08 DIAGNOSIS — Z86010 Personal history of colonic polyps: Secondary | ICD-10-CM

## 2016-05-08 DIAGNOSIS — J9601 Acute respiratory failure with hypoxia: Secondary | ICD-10-CM

## 2016-05-08 DIAGNOSIS — J189 Pneumonia, unspecified organism: Secondary | ICD-10-CM | POA: Diagnosis present

## 2016-05-08 DIAGNOSIS — M542 Cervicalgia: Secondary | ICD-10-CM | POA: Diagnosis present

## 2016-05-08 DIAGNOSIS — Z87891 Personal history of nicotine dependence: Secondary | ICD-10-CM

## 2016-05-08 DIAGNOSIS — Z882 Allergy status to sulfonamides status: Secondary | ICD-10-CM

## 2016-05-08 DIAGNOSIS — J45901 Unspecified asthma with (acute) exacerbation: Secondary | ICD-10-CM | POA: Diagnosis present

## 2016-05-08 DIAGNOSIS — M8580 Other specified disorders of bone density and structure, unspecified site: Secondary | ICD-10-CM | POA: Diagnosis present

## 2016-05-08 DIAGNOSIS — Z885 Allergy status to narcotic agent status: Secondary | ICD-10-CM

## 2016-05-08 DIAGNOSIS — Z7901 Long term (current) use of anticoagulants: Secondary | ICD-10-CM

## 2016-05-08 DIAGNOSIS — R0981 Nasal congestion: Secondary | ICD-10-CM | POA: Diagnosis present

## 2016-05-08 DIAGNOSIS — I959 Hypotension, unspecified: Secondary | ICD-10-CM | POA: Diagnosis present

## 2016-05-08 DIAGNOSIS — E119 Type 2 diabetes mellitus without complications: Secondary | ICD-10-CM | POA: Diagnosis present

## 2016-05-08 DIAGNOSIS — J181 Lobar pneumonia, unspecified organism: Secondary | ICD-10-CM | POA: Diagnosis not present

## 2016-05-08 DIAGNOSIS — Z79899 Other long term (current) drug therapy: Secondary | ICD-10-CM

## 2016-05-08 DIAGNOSIS — G4700 Insomnia, unspecified: Secondary | ICD-10-CM | POA: Diagnosis present

## 2016-05-08 DIAGNOSIS — Z853 Personal history of malignant neoplasm of breast: Secondary | ICD-10-CM

## 2016-05-08 DIAGNOSIS — R296 Repeated falls: Secondary | ICD-10-CM | POA: Diagnosis present

## 2016-05-08 LAB — URINALYSIS, ROUTINE W REFLEX MICROSCOPIC
BILIRUBIN URINE: NEGATIVE
GLUCOSE, UA: NEGATIVE mg/dL
KETONES UR: NEGATIVE mg/dL
Nitrite: NEGATIVE
PH: 7 (ref 5.0–8.0)
Protein, ur: NEGATIVE mg/dL
Specific Gravity, Urine: <1.005 — ABNORMAL LOW (ref 1.005–1.030)

## 2016-05-08 LAB — CBC WITH DIFFERENTIAL/PLATELET
BASOS ABS: 0 10*3/uL (ref 0.0–0.1)
BASOS PCT: 0 %
EOS PCT: 0 %
Eosinophils Absolute: 0 10*3/uL (ref 0.0–0.7)
HEMATOCRIT: 40.3 % (ref 36.0–46.0)
Hemoglobin: 13.4 g/dL (ref 12.0–15.0)
Lymphocytes Relative: 7 %
Lymphs Abs: 0.8 10*3/uL (ref 0.7–4.0)
MCH: 32.8 pg (ref 26.0–34.0)
MCHC: 33.3 g/dL (ref 30.0–36.0)
MCV: 98.5 fL (ref 78.0–100.0)
MONO ABS: 0.7 10*3/uL (ref 0.1–1.0)
MONOS PCT: 7 %
Neutro Abs: 9 10*3/uL — ABNORMAL HIGH (ref 1.7–7.7)
Neutrophils Relative %: 86 %
PLATELETS: 171 10*3/uL (ref 150–400)
RBC: 4.09 MIL/uL (ref 3.87–5.11)
RDW: 14.2 % (ref 11.5–15.5)
WBC: 10.5 10*3/uL (ref 4.0–10.5)

## 2016-05-08 LAB — COMPREHENSIVE METABOLIC PANEL
ALBUMIN: 3.4 g/dL — AB (ref 3.5–5.0)
ALK PHOS: 70 U/L (ref 38–126)
ALT: 17 U/L (ref 14–54)
AST: 25 U/L (ref 15–41)
Anion gap: 8 (ref 5–15)
BILIRUBIN TOTAL: 1 mg/dL (ref 0.3–1.2)
BUN: 16 mg/dL (ref 6–20)
CALCIUM: 9.2 mg/dL (ref 8.9–10.3)
CO2: 29 mmol/L (ref 22–32)
Chloride: 101 mmol/L (ref 101–111)
Creatinine, Ser: 0.74 mg/dL (ref 0.44–1.00)
GFR calc Af Amer: >60 mL/min (ref >60)
GFR calc non Af Amer: >60 mL/min (ref >60)
GLUCOSE: 110 mg/dL — AB (ref 65–99)
Potassium: 4.4 mmol/L (ref 3.5–5.1)
SODIUM: 138 mmol/L (ref 135–145)
TOTAL PROTEIN: 6.9 g/dL (ref 6.5–8.1)

## 2016-05-08 LAB — INFLUENZA PANEL BY PCR (TYPE A & B)
INFLBPCR: NEGATIVE
Influenza A By PCR: NEGATIVE

## 2016-05-08 LAB — URINALYSIS, MICROSCOPIC (REFLEX)

## 2016-05-08 LAB — I-STAT CG4 LACTIC ACID, ED
LACTIC ACID, VENOUS: 2.05 mmol/L — AB (ref 0.5–1.9)
Lactic Acid, Venous: 1.93 mmol/L (ref 0.5–1.9)

## 2016-05-08 MED ORDER — SODIUM CHLORIDE 0.9 % IV BOLUS (SEPSIS)
1000.0000 mL | Freq: Once | INTRAVENOUS | Status: AC
  Administered 2016-05-08: 1000 mL via INTRAVENOUS

## 2016-05-08 MED ORDER — DEXTROSE 5 % IV SOLN
1.0000 g | Freq: Once | INTRAVENOUS | Status: AC
  Administered 2016-05-08: 1 g via INTRAVENOUS
  Filled 2016-05-08: qty 10

## 2016-05-08 MED ORDER — AZITHROMYCIN 500 MG IV SOLR
500.0000 mg | Freq: Once | INTRAVENOUS | Status: AC
  Administered 2016-05-08: 500 mg via INTRAVENOUS
  Filled 2016-05-08: qty 500

## 2016-05-08 MED ORDER — ALBUTEROL SULFATE HFA 108 (90 BASE) MCG/ACT IN AERS
INHALATION_SPRAY | RESPIRATORY_TRACT | Status: AC
  Filled 2016-05-08: qty 6.7

## 2016-05-08 MED ORDER — ACETAMINOPHEN 325 MG PO TABS
650.0000 mg | ORAL_TABLET | Freq: Once | ORAL | Status: AC
  Administered 2016-05-08: 650 mg via ORAL
  Filled 2016-05-08: qty 2

## 2016-05-08 MED ORDER — SODIUM CHLORIDE 0.9 % IV BOLUS (SEPSIS)
500.0000 mL | Freq: Once | INTRAVENOUS | Status: AC
  Administered 2016-05-08: 500 mL via INTRAVENOUS

## 2016-05-08 MED ORDER — HYDROCODONE-ACETAMINOPHEN 5-325 MG PO TABS
1.0000 | ORAL_TABLET | Freq: Four times a day (QID) | ORAL | Status: DC | PRN
  Administered 2016-05-08 – 2016-05-10 (×4): 1 via ORAL
  Filled 2016-05-08 (×4): qty 1

## 2016-05-08 MED ORDER — ALBUTEROL SULFATE HFA 108 (90 BASE) MCG/ACT IN AERS
2.0000 | INHALATION_SPRAY | RESPIRATORY_TRACT | Status: DC | PRN
  Administered 2016-05-08: 2 via RESPIRATORY_TRACT

## 2016-05-08 NOTE — ED Notes (Signed)
Hospital bed ordered.

## 2016-05-08 NOTE — ED Notes (Signed)
Incontinence care given again.  Continues to void large amounts incontinently.

## 2016-05-08 NOTE — ED Notes (Signed)
Incontinence care given at this time.  Repositioned for comfort.

## 2016-05-08 NOTE — ED Notes (Signed)
Assisted to use bedpan and given incontinence care as needed.  Incontinent of large amount of urine.  Bed in lowest position with call bell within reach.  Encouraged to call for assistance as needed.

## 2016-05-08 NOTE — ED Notes (Signed)
Attempted report at this time.  Nurse occupied with a sick patient to try back in a few minutes.

## 2016-05-08 NOTE — ED Triage Notes (Signed)
c/o productive cough x 2 weeks with greenish yellow sputum, today has increased sob and wheezing. Able to speak in complete sentences. Warm to touch , c/o chest pain with inspiration.

## 2016-05-08 NOTE — ED Provider Notes (Signed)
[facility] DEPT Provider Note   CSN: [id number] Arrival date & time: 05/08/16  1013     History   Chief Complaint Chief Complaint  Patient presents with  . Shortness of Breath  . Cough    HPI [name] is a 83 y.o. female.  She presents for evaluation of fever cough nasal congestion and postnasal drip, for several days.  She feels weak.  She lives independently at a nursing care facility.  No other recent illnesses.  She denies chest pain back pain focal weakness or paresthesia.  There are no other known modifying factors.  HPI  Past Medical History:  Diagnosis Date  . Arthritis   . Asthma   . Breast cancer ([place]) 2006   right w/lumpectomy  . COPD (chronic obstructive pulmonary disease) ([place])   . Depression   . Diabetes ([place]) 2007  . Diverticulosis of colon   . Endometrial hyperplasia 01/1996   fibroids, adenomyosis  . Frequent UTI   . History of colon polyps   . Hypertension   . Insomnia   . Osteopenia 12/08   hip  . Spinal stenosis 2001  . Vitamin D deficiency     Patient Active Problem List   Diagnosis Date Noted  . Failed total hip arthroplasty ([place]) 07/10/2015  . Breast cancer, right breast ([place]) 07/18/2014  . CONCUSSION WITH LOC OF UNSPECIFIED DURATION 07/19/2009  . OTH SYMPTOMS INVOLVING RESPIRATORY SYSTEM&CHEST 11/12/2008  . UNSPECIFIED DISORDER OF ANKLE AND FOOT JOINT 05/11/2008  . CERUMEN IMPACTION, BILATERAL 05/04/2008  . DYSMETABOLIC SYNDROME X 95/62/1308  . UNS ADVRS EFF UNS RX MEDICINAL&BIOLOGICAL SBSTNC 08/12/2007  . Other and unspecified hyperlipidemia 04/01/2007  . INSOMNIA, PERSISTENT 09/09/2006  . DEPRESSION 09/09/2006  . HYPERTENSION 09/09/2006  . Asthma 09/09/2006  . DIVERTICULOSIS, COLON 09/09/2006  . OSTEOARTHRITIS 09/09/2006  . STENOSIS, LUMBAR SPINE 09/09/2006  . COLONIC POLYPS, HX OF 09/09/2006    Past Surgical History:  Procedure Laterality Date  . BILATERAL SALPINGOOPHORECTOMY  12/97  . BREAST LUMPECTOMY Right  03/2003  . CARPAL TUNNEL RELEASE    . CERVICAL FUSION    . CERVICAL LAMINECTOMY  1975   x4  . CHOLECYSTECTOMY  11/00   lap chole  . HYSTEROSCOPY  10/97   D&C, postmenopausal bleeding  . LUMBAR LAMINECTOMY  10/06   with spinal stenosis  . manipulation of hip for dislocation    . ROTATOR CUFF REPAIR Right 11/96  . TONSILLECTOMY AND ADENOIDECTOMY    . TOTAL ABDOMINAL HYSTERECTOMY  12/97   endo hyperplasia, fibroids, adenomyosis  . TOTAL HIP ARTHROPLASTY Right 8/00  . TOTAL HIP REVISION Right 07/10/2015   Procedure: RIGHT ACETABULAR VS TOTAL HIP REVISION;  Surgeon: Gaynelle Arabian, MD;  Location: [facility];  Service: Orthopedics;  Laterality: Right;  . TOTAL KNEE ARTHROPLASTY Left 06/2003  . TOTAL KNEE ARTHROPLASTY Right 10/06  . TUBAL LIGATION Bilateral 1968    OB History    Gravida Para Term Preterm AB Living   3 3 3          SAB TAB Ectopic Multiple Live Births                   Home Medications    Prior to Admission medications   Medication Sig Start Date End Date Taking? Authorizing Provider  acetaminophen (TYLENOL) 325 MG tablet Take 2 tablets (650 mg total) by mouth every 6 (six) hours as needed for mild pain (or Fever >/= 101). 07/12/15  Yes Alexzandrew L Perkins, PA-C  acetaminophen (  TYLENOL) 500 MG tablet Take 1,000 mg by mouth every 8 (eight) hours as needed for mild pain.   Yes Historical Provider, MD  albuterol (PROAIR HFA) 108 (90 BASE) MCG/ACT inhaler Inhale 2 puffs into the lungs every 4 (four) hours as needed for shortness of breath.    Yes Historical Provider, MD  aspirin EC 81 MG tablet Take 81 mg by mouth daily.   Yes Historical Provider, MD  buPROPion (WELLBUTRIN XL) 300 MG 24 hr tablet Take 300 mg by mouth every morning.    Yes Historical Provider, MD  Calcium Carb-Cholecalciferol (CALCIUM+D3) 600-800 MG-UNIT TABS Take 1 tablet by mouth daily.   Yes Historical Provider, MD  Cholecalciferol (VITAMIN D) 2000 units tablet Take 2,000 Units by mouth daily.   Yes  Historical Provider, MD  clonazePAM (KLONOPIN) 1 MG tablet Take 1 tablet (1 mg total) by mouth daily. Patient taking differently: Take 1 mg by mouth at bedtime as needed (insomnia).  06/03/10  Yes Ricard Dillon, MD  docusate sodium (COLACE) 100 MG capsule Take 100 mg by mouth daily.   Yes Historical Provider, MD  escitalopram (LEXAPRO) 10 MG tablet Take 10 mg by mouth daily.  04/20/11  Yes Historical Provider, MD  fluticasone (FLONASE) 50 MCG/ACT nasal spray Place 1 spray into both nostrils 2 (two) times daily.   Yes Historical Provider, MD  gabapentin (NEURONTIN) 300 MG capsule Take 1 capsule (300 mg total) by mouth 3 (three) times daily. 11/14/15  Yes Chauncey Cruel, MD  guaiFENesin (MUCINEX) 600 MG 12 hr tablet Take 600 mg by mouth 2 (two) times daily as needed for cough or to loosen phlegm.   Yes Historical Provider, MD  Homeopathic Products ([facility]) CREA Apply 1 application topically as needed (muscle pain/stiffness).   Yes Historical Provider, MD  HYDROcodone-acetaminophen (NORCO/VICODIN) 5-325 MG tablet Take 1 tablet by mouth every 6 (six) hours as needed for moderate pain.   Yes Historical Provider, MD  Ibuprofen (ADVIL) 200 MG CAPS Take 400 mg by mouth every 12 (twelve) hours as needed (pain).   Yes Historical Provider, MD  methocarbamol (ROBAXIN) 500 MG tablet Take 1 tablet (500 mg total) by mouth every 6 (six) hours as needed for muscle spasms. 07/12/15  Yes Alexzandrew L Perkins, PA-C  metoCLOPramide (REGLAN) 5 MG tablet Take 5 mg by mouth every 8 (eight) hours as needed for nausea.   Yes Historical Provider, MD  mometasone-formoterol (DULERA) 100-5 MCG/ACT AERO Inhale 2 puffs into the lungs 2 (two) times daily.   Yes Historical Provider, MD  montelukast (SINGULAIR) 10 MG tablet Take 10 mg by mouth at bedtime.     Yes Historical Provider, MD  olmesartan-hydrochlorothiazide (BENICAR HCT) 20-12.5 MG per tablet Take 1 tablet by mouth daily.     Yes Historical Provider, MD    ondansetron (ZOFRAN) 4 MG tablet Take 4 mg by mouth every 8 (eight) hours as needed for nausea or vomiting.   Yes Historical Provider, MD  polyethylene glycol (MIRALAX / GLYCOLAX) packet Take 17 g by mouth daily as needed for mild constipation. 07/12/15  Yes Alexzandrew L Perkins, PA-C  Propylene Glycol-Glycerin (SOOTHE) 0.6-0.6 % SOLN Apply 1 drop to eye 2 (two) times daily. 11/28/15  Yes Domenic Moras, PA-C  sodium phosphate (FLEET) 7-19 GM/118ML ENEM Place 133 mLs (1 enema total) rectally once as needed for severe constipation. 07/12/15  Yes Alexzandrew L Perkins, PA-C  traMADol (ULTRAM) 50 MG tablet Take 50 mg by mouth 3 (three) times daily.   Yes  Historical Provider, MD  HYDROcodone-acetaminophen (NORCO) 7.5-325 MG tablet Take 1-2 tablets by mouth every 4 (four) hours as needed for moderate pain. Patient not taking: Reported on 05/08/2016 07/12/15   Alexzandrew L Dara Lords, PA-C  rivaroxaban (XARELTO) 10 MG TABS tablet Take 1 tablet (10 mg total) by mouth daily with breakfast. Take Xarelto for two and a half more weeks, then discontinue Xarelto. Once the patient has completed the Xarelto, they may resume the 325 mg Aspirin daily. Patient not taking: Reported on 05/08/2016 07/12/15   Alexzandrew L Perkins, PA-C  temazepam (RESTORIL) 30 MG capsule Take 1 capsule (30 mg total) by mouth at bedtime as needed. Patient not taking: Reported on 07/03/2015 06/03/10   Ricard Dillon, MD    Family History Family History  Problem Relation Age of Onset  . Diabetes Father   . Colon cancer Father 44  . Heart failure Father   . Arthritis Mother   . Thyroid disease Mother   . Osteoporosis Mother   . Diabetes Brother   . Diabetes Brother   . Osteoporosis Maternal Aunt   . Osteoporosis Maternal Grandmother     Social History Social History  Substance Use Topics  . Smoking status: Former Smoker    Quit date: 04/24/1990  . Smokeless tobacco: Never Used  . Alcohol use 2.4 oz/week    4 Glasses of wine per week      Comment: occ     Allergies   Oxycodone hcl [oxycodone hcl]; Morphine sulfate; and Sulfamethoxazole   Review of Systems Review of Systems  All other systems reviewed and are negative.    Physical Exam Updated Vital Signs BP (!) 88/52   Pulse 96   Temp 100.2 F (37.9 C) (Rectal)   Resp 19   Ht 5\' 6"  (1.676 m)   Wt 185 lb (83.9 kg)   SpO2 98%   BMI 29.86 kg/m   Physical Exam  Constitutional: She is oriented to person, place, and time. She appears well-developed.  Elderly, frail  HENT:  Head: Normocephalic and atraumatic.  Eyes: EOM are normal. Pupils are equal, round, and reactive to light.  Conjunctival injection bilaterally.  Without discharge.  Neck: Normal range of motion and phonation normal. Neck supple.  Cardiovascular: Normal rate and regular rhythm.   Pulmonary/Chest: Effort normal. No respiratory distress. She exhibits no tenderness.  Generalized rhonchi and wheezes.  Abdominal: Soft. She exhibits no distension. There is no tenderness. There is no guarding.  Musculoskeletal: Normal range of motion. She exhibits edema (2+ lower legs bilaterally). She exhibits no deformity.  Neurological: She is alert and oriented to person, place, and time. She exhibits normal muscle tone.  Skin: Skin is warm and dry.  Psychiatric: She has a normal mood and affect. Her behavior is normal. Judgment and thought content normal.  Nursing note and vitals reviewed.    ED Treatments / Results  Labs (all labs ordered are listed, but only abnormal results are displayed) Labs Reviewed  COMPREHENSIVE METABOLIC PANEL - Abnormal; Notable for the following:       Result Value   Glucose, Bld 110 (*)    Albumin 3.4 (*)    All other components within normal limits  CBC WITH DIFFERENTIAL/PLATELET - Abnormal; Notable for the following:    Neutro Abs 9.0 (*)    All other components within normal limits  I-STAT CG4 LACTIC ACID, ED - Abnormal; Notable for the following:    Lactic  Acid, Venous 1.93 (*)    All other  components within normal limits  I-STAT CG4 LACTIC ACID, ED - Abnormal; Notable for the following:    Lactic Acid, Venous 2.05 (*)    All other components within normal limits  CULTURE, BLOOD (ROUTINE X 2)  CULTURE, BLOOD (ROUTINE X 2)  URINE CULTURE  INFLUENZA PANEL BY PCR (TYPE A & B)  URINALYSIS, ROUTINE W REFLEX MICROSCOPIC    EKG  EKG Interpretation None       Radiology Dg Chest Port 1 View  Result Date: 05/08/2016 CLINICAL DATA:  Productive cough for 2 weeks. EXAM: PORTABLE CHEST 1 VIEW COMPARISON:  07/04/2015 FINDINGS: Heart is normal size. Patchy density at the left base, atelectasis versus developing infiltrate. No confluent opacity on the right. No effusions or acute bony abnormality. IMPRESSION: Patchy density at the left base, atelectasis versus developing infiltrate. Electronically Signed   By: Rolm Baptise M.D.   On: 05/08/2016 11:14    Procedures Procedures (including critical care time)  Medications Ordered in ED Medications  sodium chloride 0.9 % bolus 1,000 mL (0 mLs Intravenous Stopped 05/08/16 1210)    And  sodium chloride 0.9 % bolus 1,000 mL (0 mLs Intravenous Stopped 05/08/16 1210)    And  sodium chloride 0.9 % bolus 1,000 mL (0 mLs Intravenous Stopped 05/08/16 1422)  cefTRIAXone (ROCEPHIN) 1 g in dextrose 5 % 50 mL IVPB (0 g Intravenous Stopped 05/08/16 1154)  azithromycin (ZITHROMAX) 500 mg in dextrose 5 % 250 mL IVPB (0 mg Intravenous Stopped 05/08/16 1238)  acetaminophen (TYLENOL) tablet 650 mg (650 mg Oral Given 05/08/16 1049)     Initial Impression / Assessment and Plan / ED Course  I have reviewed the triage vital signs and the nursing notes.  Pertinent labs & imaging results that were available during my care of the patient were reviewed by me and considered in my medical decision making (see chart for details).  Clinical Course as of May 09 1639  Fri May 08, 2016  1040 Initial evaluation is consistent with  sepsis.  Will evaluate lactate, initiate empiric treatment, and diagnostic evaluations.  High probability of influenza.  Isolation begun.  [EW]    Clinical Course User Index [EW] Daleen Bo, MD    Medications  sodium chloride 0.9 % bolus 1,000 mL (0 mLs Intravenous Stopped 05/08/16 1210)    And  sodium chloride 0.9 % bolus 1,000 mL (0 mLs Intravenous Stopped 05/08/16 1210)    And  sodium chloride 0.9 % bolus 1,000 mL (0 mLs Intravenous Stopped 05/08/16 1422)  cefTRIAXone (ROCEPHIN) 1 g in dextrose 5 % 50 mL IVPB (0 g Intravenous Stopped 05/08/16 1154)  azithromycin (ZITHROMAX) 500 mg in dextrose 5 % 250 mL IVPB (0 mg Intravenous Stopped 05/08/16 1238)  acetaminophen (TYLENOL) tablet 650 mg (650 mg Oral Given 05/08/16 1049)    Patient Vitals for the past 24 hrs:  BP Temp Temp src Pulse Resp SpO2 Height Weight  05/08/16 1600 (!) 88/52 - - 96 19 98 % - -  05/08/16 1509 (!) 97/56 - - 95 13 97 % - -  05/08/16 1445 (!) 101/46 - - 94 20 98 % - -  05/08/16 1430 (!) 100/53 - - 95 20 98 % - -  05/08/16 1400 (!) 98/51 - - 97 19 98 % - -  05/08/16 1345 (!) 106/52 - - 100 19 97 % - -  05/08/16 1330 (!) 101/59 - - 100 (!) 22 98 % - -  05/08/16 1309 100/61 - - (!) 103 (!) 23  99 % - -  05/08/16 1307 - 100.2 F (37.9 C) Rectal - - - - -  05/08/16 1115 (!) 115/57 - - (!) 114 (!) 27 96 % - -  05/08/16 1035 (!) 142/79 (!) 103.1 F (39.5 C) Oral (!) 120 (!) 22 90 % 5\' 6"  (1.676 m) 185 lb (83.9 kg)    4:29 PM Reevaluation with update and discussion. After initial assessment and treatment, an updated evaluation reveals patient still feels uncomfortable.  Blood pressure is persistently soft.  Lactate has trended normal.  Findings discussed with the patient and she agrees with admission.Daleen Bo L    4:39 PM-Consult complete with hospitalist. Patient case explained and discussed.  She agrees to admit patient for further evaluation and treatment. Call ended at 9: 29  CRITICAL CARE Performed by:  Daleen Bo L Total critical care time: 35 minutes Critical care time was exclusive of separately billable procedures and treating other patients. Critical care was necessary to treat or prevent imminent or life-threatening deterioration. Critical care was time spent personally by me on the following activities: development of treatment plan with patient and/or surrogate as well as nursing, discussions with consultants, evaluation of patient's response to treatment, examination of patient, obtaining history from patient or surrogate, ordering and performing treatments and interventions, ordering and review of laboratory studies, ordering and review of radiographic studies, pulse oximetry and re-evaluation of patient's condition.   Final Clinical Impressions(s) / ED Diagnoses   Final diagnoses:  Community acquired pneumonia of left lower lobe of lung ([place])  Sepsis, due to unspecified organism ([place])  Acute hypoxemic respiratory failure ([place])    Acute respiratory illness, with sepsis and pneumonia.  Severe sepsis metabolic instability or impending vascular collapse.  Mild soft blood pressure, which will require additional treatment.  Oxygenation normal with supplementation, nasal cannula.  He was hypoxic initially on room air.  No respiratory distress.  No indication for advanced respiratory support.  Nursing Notes Reviewed/ Care Coordinated Applicable Imaging Reviewed Interpretation of Laboratory Data incorporated into ED treatment   Plan: Admit  New Prescriptions New Prescriptions   No medications on file     Daleen Bo, MD 05/08/16 1650

## 2016-05-08 NOTE — H&P (Signed)
[facility] History and Physical  [name] WCH:[id number] DOB: [birth year] DOA: 05/08/2016  PCP: Sheela Stack, MD  Patient coming from: [place], assisted-living facility  Chief Complaint: Shortness of breath  HPI: [name] is a 83 y.o. female with a medical history of asthma, COPD, depression and anxiety, hypertension, chronic neck pain, who presented to the emergency department with complaints of shortness of breath. Patient states this has been ongoing for approximately 2 weeks. She has also had postnasal drip and nasal congestion for the last several days. She has also had fever. Patient does complain of feeling weak recently. She has only had a dry cough. Nothing seems to make her symptoms better or worse. Currently she denies any chest pain, abdominal pain, nausea or vomiting, diarrhea constipation, problems urinating, dizziness or headache. Patient denies any recent travel or ill contacts. Of note, she has been complaining of falling lately. She currently resides in assisted living facility.  ED Course: Found to have hypotension, pneumonia.  Patient placed on IV antibiotics and given IVF. BP improving. TRH called for admission.   Review of Systems:  All other systems reviewed and are negative.   Past Medical History:  Diagnosis Date  . Arthritis   . Asthma   . Breast cancer ([place]) 2006   right w/lumpectomy  . COPD (chronic obstructive pulmonary disease) ([place])   . Depression   . Diabetes ([place]) 2007  . Diverticulosis of colon   . Endometrial hyperplasia 01/1996   fibroids, adenomyosis  . Frequent UTI   . History of colon polyps   . Hypertension   . Insomnia   . Osteopenia 12/08   hip  . Spinal stenosis 2001  . Vitamin D deficiency     Past Surgical History:  Procedure Laterality Date  . BILATERAL SALPINGOOPHORECTOMY  12/97  . BREAST LUMPECTOMY Right 03/2003  . CARPAL TUNNEL RELEASE    . CERVICAL FUSION    . CERVICAL LAMINECTOMY  1975   x4    . CHOLECYSTECTOMY  11/00   lap chole  . HYSTEROSCOPY  10/97   D&C, postmenopausal bleeding  . LUMBAR LAMINECTOMY  10/06   with spinal stenosis  . manipulation of hip for dislocation    . ROTATOR CUFF REPAIR Right 11/96  . TONSILLECTOMY AND ADENOIDECTOMY    . TOTAL ABDOMINAL HYSTERECTOMY  12/97   endo hyperplasia, fibroids, adenomyosis  . TOTAL HIP ARTHROPLASTY Right 8/00  . TOTAL HIP REVISION Right 07/10/2015   Procedure: RIGHT ACETABULAR VS TOTAL HIP REVISION;  Surgeon: Gaynelle Arabian, MD;  Location: [facility];  Service: Orthopedics;  Laterality: Right;  . TOTAL KNEE ARTHROPLASTY Left 06/2003  . TOTAL KNEE ARTHROPLASTY Right 10/06  . TUBAL LIGATION Bilateral 1968    Social History:  reports that she quit smoking about 26 years ago. She has never used smokeless tobacco. She reports that she drinks about 2.4 oz of alcohol per week . She reports that she does not use drugs.  Allergies  Allergen Reactions  . Oxycodone Hcl [Oxycodone Hcl] Rash  . Morphine Sulfate Other (See Comments)    REACTION: very anxious  . Sulfamethoxazole Other (See Comments)    REACTION: unspecified    Family History  Problem Relation Age of Onset  . Diabetes Father   . Colon cancer Father 72  . Heart failure Father   . Arthritis Mother   . Thyroid disease Mother   . Osteoporosis Mother   . Diabetes Brother   . Diabetes Brother   .  Osteoporosis Maternal Aunt   . Osteoporosis Maternal Grandmother     Prior to Admission medications   Medication Sig Start Date End Date Taking? Authorizing Provider  acetaminophen (TYLENOL) 325 MG tablet Take 2 tablets (650 mg total) by mouth every 6 (six) hours as needed for mild pain (or Fever >/= 101). 07/12/15  Yes Alexzandrew L Perkins, PA-C  acetaminophen (TYLENOL) 500 MG tablet Take 1,000 mg by mouth every 8 (eight) hours as needed for mild pain.   Yes Historical Provider, MD  albuterol (PROAIR HFA) 108 (90 BASE) MCG/ACT inhaler Inhale 2 puffs into the lungs every 4  (four) hours as needed for shortness of breath.    Yes Historical Provider, MD  aspirin EC 81 MG tablet Take 81 mg by mouth daily.   Yes Historical Provider, MD  buPROPion (WELLBUTRIN XL) 300 MG 24 hr tablet Take 300 mg by mouth every morning.    Yes Historical Provider, MD  Calcium Carb-Cholecalciferol (CALCIUM+D3) 600-800 MG-UNIT TABS Take 1 tablet by mouth daily.   Yes Historical Provider, MD  Cholecalciferol (VITAMIN D) 2000 units tablet Take 2,000 Units by mouth daily.   Yes Historical Provider, MD  clonazePAM (KLONOPIN) 1 MG tablet Take 1 tablet (1 mg total) by mouth daily. Patient taking differently: Take 1 mg by mouth at bedtime as needed (insomnia).  06/03/10  Yes Ricard Dillon, MD  docusate sodium (COLACE) 100 MG capsule Take 100 mg by mouth daily.   Yes Historical Provider, MD  escitalopram (LEXAPRO) 10 MG tablet Take 10 mg by mouth daily.  04/20/11  Yes Historical Provider, MD  fluticasone (FLONASE) 50 MCG/ACT nasal spray Place 1 spray into both nostrils 2 (two) times daily.   Yes Historical Provider, MD  gabapentin (NEURONTIN) 300 MG capsule Take 1 capsule (300 mg total) by mouth 3 (three) times daily. 11/14/15  Yes Chauncey Cruel, MD  guaiFENesin (MUCINEX) 600 MG 12 hr tablet Take 600 mg by mouth 2 (two) times daily as needed for cough or to loosen phlegm.   Yes Historical Provider, MD  Homeopathic Products ([facility]) CREA Apply 1 application topically as needed (muscle pain/stiffness).   Yes Historical Provider, MD  HYDROcodone-acetaminophen (NORCO/VICODIN) 5-325 MG tablet Take 1 tablet by mouth every 6 (six) hours as needed for moderate pain.   Yes Historical Provider, MD  Ibuprofen (ADVIL) 200 MG CAPS Take 400 mg by mouth every 12 (twelve) hours as needed (pain).   Yes Historical Provider, MD  methocarbamol (ROBAXIN) 500 MG tablet Take 1 tablet (500 mg total) by mouth every 6 (six) hours as needed for muscle spasms. 07/12/15  Yes Alexzandrew L Perkins, PA-C    mometasone-formoterol (DULERA) 100-5 MCG/ACT AERO Inhale 2 puffs into the lungs 2 (two) times daily.   Yes Historical Provider, MD  montelukast (SINGULAIR) 10 MG tablet Take 10 mg by mouth at bedtime.     Yes Historical Provider, MD  olmesartan-hydrochlorothiazide (BENICAR HCT) 20-12.5 MG per tablet Take 1 tablet by mouth daily.     Yes Historical Provider, MD  ondansetron (ZOFRAN) 4 MG tablet Take 4 mg by mouth every 8 (eight) hours as needed for nausea or vomiting.   Yes Historical Provider, MD  polyethylene glycol (MIRALAX / GLYCOLAX) packet Take 17 g by mouth daily as needed for mild constipation. 07/12/15  Yes Alexzandrew L Perkins, PA-C  Propylene Glycol-Glycerin (SOOTHE) 0.6-0.6 % SOLN Apply 1 drop to eye 2 (two) times daily. 11/28/15  Yes Domenic Moras, PA-C  sodium phosphate (FLEET) 7-19 GM/118ML  ENEM Place 133 mLs (1 enema total) rectally once as needed for severe constipation. 07/12/15  Yes Alexzandrew L Perkins, PA-C  traMADol (ULTRAM) 50 MG tablet Take 50 mg by mouth 3 (three) times daily.   Yes Historical Provider, MD    Physical Exam: Vitals:   05/08/16 1600 05/08/16 1700  BP: (!) 88/52 104/61  Pulse: 96 96  Resp: 19 17  Temp:       General: Well developed, well nourished, NAD, appears stated age  HEENT: NCAT, PERRLA, EOMI, Anicteic Sclera, mucous membranes moist.   Neck: Supple, no JVD, no masses  Cardiovascular: S1 S2 auscultated, no rubs, murmurs or gallops. Tachycardia   Respiratory: Diminished breath sounds, generalized rhonchi and wheezing.  Abdomen: Soft, nontender, nondistended, + bowel sounds  Extremities: warm dry without cyanosis clubbing. LE edema B/L, several wounds noted.  Neuro: AAOx3, cranial nerves grossly intact. Strength 5/5 in patient's upper and lower extremities bilaterally  Skin: Without rashes exudates or nodules  Psych: Normal affect and demeanor with intact judgement and insight  Labs on Admission: I have personally reviewed following labs  and imaging studies CBC:  Recent Labs Lab 05/08/16 1100  WBC 10.5  NEUTROABS 9.0*  HGB 13.4  HCT 40.3  MCV 98.5  PLT 616   Basic Metabolic Panel:  Recent Labs Lab 05/08/16 1100  NA 138  K 4.4  CL 101  CO2 29  GLUCOSE 110*  BUN 16  CREATININE 0.74  CALCIUM 9.2   GFR: Estimated Creatinine Clearance: 57.1 mL/min (by C-G formula based on SCr of 0.74 mg/dL). Liver Function Tests:  Recent Labs Lab 05/08/16 1100  AST 25  ALT 17  ALKPHOS 70  BILITOT 1.0  PROT 6.9  ALBUMIN 3.4*   No results for input(s): LIPASE, AMYLASE in the last 168 hours. No results for input(s): AMMONIA in the last 168 hours. Coagulation Profile: No results for input(s): INR, PROTIME in the last 168 hours. Cardiac Enzymes: No results for input(s): CKTOTAL, CKMB, CKMBINDEX, TROPONINI in the last 168 hours. BNP (last 3 results) No results for input(s): PROBNP in the last 8760 hours. HbA1C: No results for input(s): HGBA1C in the last 72 hours. CBG: No results for input(s): GLUCAP in the last 168 hours. Lipid Profile: No results for input(s): CHOL, HDL, LDLCALC, TRIG, CHOLHDL, LDLDIRECT in the last 72 hours. Thyroid Function Tests: No results for input(s): TSH, T4TOTAL, FREET4, T3FREE, THYROIDAB in the last 72 hours. Anemia Panel: No results for input(s): VITAMINB12, FOLATE, FERRITIN, TIBC, IRON, RETICCTPCT in the last 72 hours. Urine analysis:    Component Value Date/Time   COLORURINE YELLOW 11/28/2015 2040   APPEARANCEUR TURBID (A) 11/28/2015 2040   LABSPEC 1.012 11/28/2015 2040   PHURINE 7.0 11/28/2015 2040   GLUCOSEU NEGATIVE 11/28/2015 2040   HGBUR MODERATE (A) 11/28/2015 2040   BILIRUBINUR NEGATIVE 11/28/2015 2040   BILIRUBINUR n 09/25/2010 1525   KETONESUR NEGATIVE 11/28/2015 2040   PROTEINUR NEGATIVE 11/28/2015 2040   UROBILINOGEN 0.2 09/25/2010 1525   UROBILINOGEN 0.2 01/04/2007 0900   NITRITE NEGATIVE 11/28/2015 2040   LEUKOCYTESUR LARGE (A) 11/28/2015 2040   Sepsis  Labs: @LABRCNTIP (procalcitonin:4,lacticidven:4) )No results found for this or any previous visit (from the past 240 hour(s)).   Radiological Exams on Admission: Dg Chest Port 1 View  Result Date: 05/08/2016 CLINICAL DATA:  Productive cough for 2 weeks. EXAM: PORTABLE CHEST 1 VIEW COMPARISON:  07/04/2015 FINDINGS: Heart is normal size. Patchy density at the left base, atelectasis versus developing infiltrate. No confluent opacity on the  right. No effusions or acute bony abnormality. IMPRESSION: Patchy density at the left base, atelectasis versus developing infiltrate. Electronically Signed   By: Rolm Baptise M.D.   On: 05/08/2016 11:14    EKG: None  Assessment/Plan Sepsis secondary to community acquired pneumonia -Upon admission, patient was noted to be febrile, tachycardic, tachypneic, with mild hypotension (responding to IVF) -CXR patchy density in left lung base -Lactic acid mildly elevated 2.05 -Will use sepsis and pneumonia protocol  -Will place on ceftriaxone and azithromycin, IVF  -pending blood and sputum culture, strep pneumonia urine antigen -Influenza PCR negative  Dyspnea secondary to pneumonia -continue supplemental oxygen to maintain O2 sats >90%  Hypotension -Possibly secondary to sepsis and medications, volume depletion. -Patient has been taking Benicar HCTZ. Patient states that her BP normally runs low and she is only supposed to take this medication if her BP >132/90.  However she has been receiving it daily at the ALF. -BP responding to IVF  Falls -Patient has had several falls lately. ?if this is due to hypotension -Will consult PT and OT  Lower extremity wounds -Will consult wound care  Asthma/COPD -Currently no wheezing -Continue home medications, Albuterol, Dulera  Chronic neck pain -Continue hydrocodone, robaxin, PRN and gabapentin   Depression/Anxiety -Continue wellbutrin, lexapro, klonopin  Nasal congestion -Continue flonase  DVT prophylaxis:  Lovenox  Code Status:Full  Family Communication: Daughter at bedside. Admission, patients condition and plan of care including tests being ordered have been discussed with the patient and daughter who indicate understanding and agree with the plan and Code Status.  Disposition Plan: Discharge back to ALF  Consults called: None   Admission status: Inpatient. Patient currently needs IVF for hypotension and IV antibiotics for sepsis/pneumonia.   Time spent: 65 minutes  Evelyna Folker D.O. [facility] Pager [phone]  If 7PM-7AM, please contact night-coverage [email or web address] Password [facility] 05/08/2016, 5:42 PM

## 2016-05-09 DIAGNOSIS — J189 Pneumonia, unspecified organism: Secondary | ICD-10-CM

## 2016-05-09 LAB — BASIC METABOLIC PANEL
Anion gap: 8 (ref 5–15)
BUN: 9 mg/dL (ref 6–20)
CHLORIDE: 107 mmol/L (ref 101–111)
CO2: 25 mmol/L (ref 22–32)
CREATININE: 0.61 mg/dL (ref 0.44–1.00)
Calcium: 8.6 mg/dL — ABNORMAL LOW (ref 8.9–10.3)
GFR calc Af Amer: >60 mL/min (ref >60)
GFR calc non Af Amer: >60 mL/min (ref >60)
Glucose, Bld: 100 mg/dL — ABNORMAL HIGH (ref 65–99)
Potassium: 4 mmol/L (ref 3.5–5.1)
Sodium: 140 mmol/L (ref 135–145)

## 2016-05-09 LAB — COMPREHENSIVE METABOLIC PANEL
ALBUMIN: 3.2 g/dL — AB (ref 3.5–5.0)
ALT: 16 U/L (ref 14–54)
AST: 19 U/L (ref 15–41)
Alkaline Phosphatase: 52 U/L (ref 38–126)
Anion gap: 8 (ref 5–15)
BUN: 10 mg/dL (ref 6–20)
CHLORIDE: 106 mmol/L (ref 101–111)
CO2: 26 mmol/L (ref 22–32)
CREATININE: 0.62 mg/dL (ref 0.44–1.00)
Calcium: 8.7 mg/dL — ABNORMAL LOW (ref 8.9–10.3)
GFR calc Af Amer: >60 mL/min (ref >60)
Glucose, Bld: 97 mg/dL (ref 65–99)
POTASSIUM: 4.1 mmol/L (ref 3.5–5.1)
Sodium: 140 mmol/L (ref 135–145)
TOTAL PROTEIN: 5.5 g/dL — AB (ref 6.5–8.1)
Total Bilirubin: 0.8 mg/dL (ref 0.3–1.2)

## 2016-05-09 LAB — CBC WITH DIFFERENTIAL/PLATELET
BASOS ABS: 0 10*3/uL (ref 0.0–0.1)
BASOS PCT: 0 %
EOS PCT: 1 %
Eosinophils Absolute: 0.1 10*3/uL (ref 0.0–0.7)
HEMATOCRIT: 35.7 % — AB (ref 36.0–46.0)
Hemoglobin: 11.8 g/dL — ABNORMAL LOW (ref 12.0–15.0)
LYMPHS PCT: 8 %
Lymphs Abs: 1 10*3/uL (ref 0.7–4.0)
MCH: 32.3 pg (ref 26.0–34.0)
MCHC: 33.1 g/dL (ref 30.0–36.0)
MCV: 97.8 fL (ref 78.0–100.0)
Monocytes Absolute: 0.9 10*3/uL (ref 0.1–1.0)
Monocytes Relative: 8 %
NEUTROS ABS: 9.4 10*3/uL — AB (ref 1.7–7.7)
Neutrophils Relative %: 83 %
PLATELETS: 162 10*3/uL (ref 150–400)
RBC: 3.65 MIL/uL — AB (ref 3.87–5.11)
RDW: 13.9 % (ref 11.5–15.5)
WBC: 11.3 10*3/uL — AB (ref 4.0–10.5)

## 2016-05-09 LAB — PROTIME-INR
INR: 1.11
PROTHROMBIN TIME: 14.4 s (ref 11.4–15.2)

## 2016-05-09 LAB — PROCALCITONIN: PROCALCITONIN: 0.31 ng/mL

## 2016-05-09 LAB — LACTIC ACID, PLASMA
LACTIC ACID, VENOUS: 1.1 mmol/L (ref 0.5–1.9)
LACTIC ACID, VENOUS: 0.7 mmol/L (ref 0.5–1.9)

## 2016-05-09 LAB — MRSA PCR SCREENING: MRSA by PCR: POSITIVE — AB

## 2016-05-09 LAB — STREP PNEUMONIAE URINARY ANTIGEN: Strep Pneumo Urinary Antigen: NEGATIVE

## 2016-05-09 LAB — APTT: aPTT: 32 seconds (ref 24–36)

## 2016-05-09 MED ORDER — ARNICARE ARNICA EX CREA: 1.0000 "application " | TOPICAL_CREAM | CUTANEOUS | Status: DC | PRN

## 2016-05-09 MED ORDER — CALCIUM CARB-CHOLECALCIFEROL 600-800 MG-UNIT PO TABS: 1.0000 | ORAL_TABLET | Freq: Every day | ORAL | Status: DC

## 2016-05-09 MED ORDER — IBUPROFEN 400 MG PO TABS: 400.0000 mg | ORAL_TABLET | Freq: Two times a day (BID) | ORAL | Status: DC | PRN

## 2016-05-09 MED ORDER — GUAIFENESIN ER 600 MG PO TB12: 600.0000 mg | ORAL_TABLET | Freq: Two times a day (BID) | ORAL | Status: DC | PRN

## 2016-05-09 MED ORDER — BUPROPION HCL ER (XL) 150 MG PO TB24
300.0000 mg | ORAL_TABLET | Freq: Every day | ORAL | Status: DC
  Administered 2016-05-09 – 2016-05-10 (×2): 300 mg via ORAL
  Filled 2016-05-09 (×2): qty 2

## 2016-05-09 MED ORDER — POLYVINYL ALCOHOL 1.4 % OP SOLN
1.0000 [drp] | Freq: Two times a day (BID) | OPHTHALMIC | Status: DC
  Filled 2016-05-09: qty 15

## 2016-05-09 MED ORDER — GABAPENTIN 300 MG PO CAPS
300.0000 mg | ORAL_CAPSULE | Freq: Three times a day (TID) | ORAL | Status: DC
  Administered 2016-05-09 – 2016-05-10 (×3): 300 mg via ORAL
  Filled 2016-05-09 (×3): qty 1

## 2016-05-09 MED ORDER — ENOXAPARIN SODIUM 40 MG/0.4ML ~~LOC~~ SOLN
40.0000 mg | SUBCUTANEOUS | Status: DC
  Administered 2016-05-09: 40 mg via SUBCUTANEOUS
  Filled 2016-05-09 (×2): qty 0.4

## 2016-05-09 MED ORDER — CALCIUM CARBONATE-VITAMIN D 500-200 MG-UNIT PO TABS
1.0000 | ORAL_TABLET | Freq: Every day | ORAL | Status: DC
  Administered 2016-05-09 – 2016-05-10 (×2): 1 via ORAL
  Filled 2016-05-09 (×2): qty 1

## 2016-05-09 MED ORDER — DOCUSATE SODIUM 100 MG PO CAPS
100.0000 mg | ORAL_CAPSULE | Freq: Every day | ORAL | Status: DC
Start: 2016-05-09 — End: 2016-05-10
  Filled 2016-05-09 (×2): qty 1

## 2016-05-09 MED ORDER — FLEET ENEMA 7-19 GM/118ML RE ENEM
1.0000 | ENEMA | Freq: Once | RECTAL | Status: DC | PRN
  Filled 2016-05-09: qty 1

## 2016-05-09 MED ORDER — ESCITALOPRAM OXALATE 10 MG PO TABS
10.0000 mg | ORAL_TABLET | Freq: Every day | ORAL | Status: DC
  Administered 2016-05-09 – 2016-05-10 (×2): 10 mg via ORAL
  Filled 2016-05-09 (×2): qty 1

## 2016-05-09 MED ORDER — SODIUM CHLORIDE 0.9 % IV SOLN
INTRAVENOUS | Status: DC
  Administered 2016-05-09: 01:00:00 via INTRAVENOUS

## 2016-05-09 MED ORDER — MOMETASONE FURO-FORMOTEROL FUM 100-5 MCG/ACT IN AERO
2.0000 | INHALATION_SPRAY | Freq: Two times a day (BID) | RESPIRATORY_TRACT | Status: DC
  Administered 2016-05-09 – 2016-05-10 (×3): 2 via RESPIRATORY_TRACT
  Filled 2016-05-09: qty 8.8

## 2016-05-09 MED ORDER — DEXTROSE 5 % IV SOLN
500.0000 mg | INTRAVENOUS | Status: DC
  Administered 2016-05-09 – 2016-05-10 (×2): 500 mg via INTRAVENOUS
  Filled 2016-05-09 (×2): qty 500

## 2016-05-09 MED ORDER — MUPIROCIN 2 % EX OINT
1.0000 "application " | TOPICAL_OINTMENT | Freq: Two times a day (BID) | CUTANEOUS | Status: DC
  Administered 2016-05-09 – 2016-05-10 (×2): 1 via NASAL
  Filled 2016-05-09: qty 22

## 2016-05-09 MED ORDER — ALBUTEROL SULFATE (2.5 MG/3ML) 0.083% IN NEBU: 2.5000 mg | INHALATION_SOLUTION | RESPIRATORY_TRACT | Status: DC | PRN

## 2016-05-09 MED ORDER — METHOCARBAMOL 500 MG PO TABS
500.0000 mg | ORAL_TABLET | Freq: Four times a day (QID) | ORAL | Status: DC | PRN
  Administered 2016-05-10: 500 mg via ORAL
  Filled 2016-05-09: qty 1

## 2016-05-09 MED ORDER — CLONAZEPAM 1 MG PO TABS
1.0000 mg | ORAL_TABLET | Freq: Every evening | ORAL | Status: DC | PRN
  Administered 2016-05-09 (×2): 1 mg via ORAL
  Filled 2016-05-09 (×2): qty 1

## 2016-05-09 MED ORDER — POLYETHYLENE GLYCOL 3350 17 G PO PACK: 17.0000 g | PACK | Freq: Every day | ORAL | Status: DC | PRN

## 2016-05-09 MED ORDER — MONTELUKAST SODIUM 10 MG PO TABS
10.0000 mg | ORAL_TABLET | Freq: Every day | ORAL | Status: DC
  Administered 2016-05-09 (×2): 10 mg via ORAL
  Filled 2016-05-09 (×2): qty 1

## 2016-05-09 MED ORDER — CHLORHEXIDINE GLUCONATE CLOTH 2 % EX PADS
6.0000 | MEDICATED_PAD | Freq: Every day | CUTANEOUS | Status: DC
  Administered 2016-05-10: 6 via TOPICAL

## 2016-05-09 MED ORDER — ONDANSETRON HCL 4 MG PO TABS: 4.0000 mg | ORAL_TABLET | Freq: Three times a day (TID) | ORAL | Status: DC | PRN

## 2016-05-09 MED ORDER — CEFTRIAXONE SODIUM 1 G IJ SOLR
1.0000 g | INTRAMUSCULAR | Status: DC
  Administered 2016-05-09 – 2016-05-10 (×2): 1 g via INTRAVENOUS
  Filled 2016-05-09 (×2): qty 10

## 2016-05-09 MED ORDER — PROPYLENE GLYCOL-GLYCERIN 0.6-0.6 % OP SOLN: 1.0000 [drp] | Freq: Two times a day (BID) | OPHTHALMIC | Status: DC

## 2016-05-09 MED ORDER — ASPIRIN EC 81 MG PO TBEC
81.0000 mg | DELAYED_RELEASE_TABLET | Freq: Every day | ORAL | Status: DC
  Administered 2016-05-09 – 2016-05-10 (×2): 81 mg via ORAL
  Filled 2016-05-09 (×2): qty 1

## 2016-05-09 MED ORDER — FLUTICASONE PROPIONATE 50 MCG/ACT NA SUSP
1.0000 | Freq: Two times a day (BID) | NASAL | Status: DC
  Administered 2016-05-09: 1 via NASAL
  Filled 2016-05-09: qty 16

## 2016-05-09 MED ORDER — VITAMIN D 1000 UNITS PO TABS
2000.0000 [IU] | ORAL_TABLET | Freq: Every day | ORAL | Status: DC
  Administered 2016-05-09 – 2016-05-10 (×2): 2000 [IU] via ORAL
  Filled 2016-05-09 (×2): qty 2

## 2016-05-09 NOTE — ED Notes (Signed)
Calls out reporting "I am having an asthma attack".  Inhaler given per order.  Breathing easy and nonlabored.  O2 sats noted to be 97%.  Bed in lowest position with call bell within reach.  Encouraged to call for assistance as needed.

## 2016-05-09 NOTE — Progress Notes (Signed)
PROGRESS NOTE    [name]  HKV:[id number] DOB: 08/25/1930 DOA: 05/08/2016 PCP: Sheela Stack, MD   Chief Complaint  Patient presents with  . Shortness of Breath  . Cough     Brief Narrative:  HPI on 05/08/2016  [name] is a 83 y.o. female with a medical history of asthma, COPD, depression and anxiety, hypertension, chronic neck pain, who presented to the emergency department with complaints of shortness of breath. Patient states this has been ongoing for approximately 2 weeks. She has also had postnasal drip and nasal congestion for the last several days. She has also had fever. Patient does complain of feeling weak recently. She has only had a dry cough. Nothing seems to make her symptoms better or worse. Currently she denies any chest pain, abdominal pain, nausea or vomiting, diarrhea constipation, problems urinating, dizziness or headache. Patient denies any recent travel or ill contacts. Of note, she has been complaining of falling lately. She currently resides in assisted living facility. Assessment & Plan   Sepsis secondary to community acquired pneumonia -Upon admission, patient was noted to be febrile, tachycardic, tachypneic, with mild hypotension (responding to IVF) -CXR patchy density in left lung base -Lactic acid mildly elevated 2.05 upon admission, has resolved, currently 0.7 -Sepsis and pneumonia protocol utilized on admission -Continue ceftriaxone and azithromycin -pending blood and sputum culture, strep pneumonia urine antigen -Influenza PCR negative  Dyspnea secondary to pneumonia -continue supplemental oxygen to maintain O2 sats >90%  Hypotension -Possibly secondary to sepsis and medications, volume depletion. -Patient has been taking Benicar HCTZ. Patient states that her BP normally runs low and she is only supposed to take this medication if her BP >132/90.  However she has been receiving it daily at the ALF. -BP responding to IVF and has  improved, now 129/63  Falls -Patient has had several falls lately. ?if this is due to hypotension -PT and OT consulted for assessment  Lower extremity wounds -Wound care consulted  Asthma/COPD -Currently no wheezing -Continue home medications, Albuterol, Dulera  Chronic neck pain -Continue hydrocodone, robaxin, PRN and gabapentin   Depression/Anxiety -Continue wellbutrin, lexapro, klonopin  Nasal congestion -Continue flonase  DVT Prophylaxis  Lovenox  Code Status: Full  Family Communication: None at bedside  Disposition Plan: Admitted. Possible discharge within 24-48 hours pending improvement in breathing/respiratory status.  Consultants None  Procedures  None  Antibiotics   Anti-infectives    Start     Dose/Rate Route Frequency Ordered Stop   05/09/16 1000  cefTRIAXone (ROCEPHIN) 1 g in dextrose 5 % 50 mL IVPB     1 g 100 mL/hr over 30 Minutes Intravenous Every 24 hours 05/09/16 0043 05/15/16 0959   05/09/16 0800  azithromycin (ZITHROMAX) 500 mg in dextrose 5 % 250 mL IVPB     500 mg 250 mL/hr over 60 Minutes Intravenous Every 24 hours 05/09/16 0043 05/15/16 0759   05/08/16 1045  cefTRIAXone (ROCEPHIN) 1 g in dextrose 5 % 50 mL IVPB     1 g 100 mL/hr over 30 Minutes Intravenous  Once 05/08/16 1037 05/08/16 1154   05/08/16 1045  azithromycin (ZITHROMAX) 500 mg in dextrose 5 % 250 mL IVPB     500 mg 250 mL/hr over 60 Minutes Intravenous  Once 05/08/16 1037 05/08/16 1238      Subjective:   [name] seen and examined today.  Patient is feeling mildly better, but continues to cough and have shortness of breath. Feels breathing is not back to her baseline.  Complains of neck pain. Denies chest pain, abdominal pain, nausea, vomiting.   Objective:   Vitals:   05/09/16 0158 05/09/16 0638 05/09/16 0858 05/09/16 0947  BP: (!) 105/54 (!) 113/51 129/63   Pulse: 70 83 91   Resp: 14 14 18    Temp: 99.5 F (37.5 C) 98.7 F (37.1 C) 98.3 F (36.8 C)     TempSrc: Oral Oral Oral   SpO2: 96% 94% 96% 93%  Weight: 93 kg (205 lb 1.6 oz)     Height:        Intake/Output Summary (Last 24 hours) at 05/09/16 1111 Last data filed at 05/09/16 1045  Gross per 24 hour  Intake          6894.59 ml  Output              200 ml  Net          6694.59 ml   Filed Weights   05/08/16 1035 05/09/16 0158  Weight: 83.9 kg (185 lb) 93 kg (205 lb 1.6 oz)    Exam  General: Well developed, well nourished, NAD, appears stated age  HEENT: NCAT, mucous membranes moist.   Cardiovascular: S1 S2 auscultated, no rubs, murmurs or gallops. Regular rate and rhythm.  Respiratory: Diminished breath sounds, occ cough  Abdomen: Soft, nontender, nondistended, + bowel sounds  Extremities: warm dry without cyanosis clubbing. +LE edema B/L, wounds noted  Neuro: AAOx3, nonfocal  Psych: Appropriate mood and affect   Data Reviewed: I have personally reviewed following labs and imaging studies  CBC:  Recent Labs Lab 05/08/16 1100 05/09/16 0121  WBC 10.5 11.3*  NEUTROABS 9.0* 9.4*  HGB 13.4 11.8*  HCT 40.3 35.7*  MCV 98.5 97.8  PLT 171 244   Basic Metabolic Panel:  Recent Labs Lab 05/08/16 1100 05/09/16 0121  NA 138 140  140  K 4.4 4.1  4.0  CL 101 106  107  CO2 29 26  25   GLUCOSE 110* 97  100*  BUN 16 10  9   CREATININE 0.74 0.62  0.61  CALCIUM 9.2 8.7*  8.6*   GFR: Estimated Creatinine Clearance: 60.2 mL/min (by C-G formula based on SCr of 0.62 mg/dL). Liver Function Tests:  Recent Labs Lab 05/08/16 1100 05/09/16 0121  AST 25 19  ALT 17 16  ALKPHOS 70 52  BILITOT 1.0 0.8  PROT 6.9 5.5*  ALBUMIN 3.4* 3.2*   No results for input(s): LIPASE, AMYLASE in the last 168 hours. No results for input(s): AMMONIA in the last 168 hours. Coagulation Profile:  Recent Labs Lab 05/09/16 0121  INR 1.11   Cardiac Enzymes: No results for input(s): CKTOTAL, CKMB, CKMBINDEX, TROPONINI in the last 168 hours. BNP (last 3 results) No  results for input(s): PROBNP in the last 8760 hours. HbA1C: No results for input(s): HGBA1C in the last 72 hours. CBG: No results for input(s): GLUCAP in the last 168 hours. Lipid Profile: No results for input(s): CHOL, HDL, LDLCALC, TRIG, CHOLHDL, LDLDIRECT in the last 72 hours. Thyroid Function Tests: No results for input(s): TSH, T4TOTAL, FREET4, T3FREE, THYROIDAB in the last 72 hours. Anemia Panel: No results for input(s): VITAMINB12, FOLATE, FERRITIN, TIBC, IRON, RETICCTPCT in the last 72 hours. Urine analysis:    Component Value Date/Time   COLORURINE YELLOW 05/08/2016 1929   APPEARANCEUR CLEAR 05/08/2016 1929   LABSPEC <1.005 (L) 05/08/2016 1929   PHURINE 7.0 05/08/2016 1929   GLUCOSEU NEGATIVE 05/08/2016 1929   HGBUR TRACE (A) 05/08/2016 [place]  NEGATIVE 05/08/2016 1929   BILIRUBINUR n 09/25/2010 [place] 05/08/2016 1929   PROTEINUR NEGATIVE 05/08/2016 1929   UROBILINOGEN 0.2 09/25/2010 1525   UROBILINOGEN 0.2 01/04/2007 0900   NITRITE NEGATIVE 05/08/2016 1929   LEUKOCYTESUR SMALL (A) 05/08/2016 1929   Sepsis Labs: @LABRCNTIP (procalcitonin:4,lacticidven:4)  ) Recent Results (from the past 240 hour(s))  MRSA PCR Screening     Status: Abnormal   Collection Time: 05/09/16  5:35 AM  Result Value Ref Range Status   MRSA by PCR POSITIVE (A) NEGATIVE Final    Comment:        The GeneXpert MRSA Assay (FDA approved for NASAL specimens only), is one component of a comprehensive MRSA colonization surveillance program. It is not intended to diagnose MRSA infection nor to guide or monitor treatment for MRSA infections. RESULT CALLED TO, READ BACK BY AND VERIFIED WITH: T.[facility] RN AT [id number] 05/09/16 BY A.DAVIS       Radiology Studies: Dg Chest Port 1 View  Result Date: 05/08/2016 CLINICAL DATA:  Productive cough for 2 weeks. EXAM: PORTABLE CHEST 1 VIEW COMPARISON:  07/04/2015 FINDINGS: Heart is normal size. Patchy density at the left base,  atelectasis versus developing infiltrate. No confluent opacity on the right. No effusions or acute bony abnormality. IMPRESSION: Patchy density at the left base, atelectasis versus developing infiltrate. Electronically Signed   By: Rolm Baptise M.D.   On: 05/08/2016 11:14     Scheduled Meds: . albuterol      . aspirin EC  81 mg Oral Daily  . azithromycin  500 mg Intravenous Q24H  . buPROPion  300 mg Oral Daily  . calcium-vitamin D  1 tablet Oral Q breakfast  . cefTRIAXone (ROCEPHIN)  IV  1 g Intravenous Q24H  . cholecalciferol  2,000 Units Oral Daily  . docusate sodium  100 mg Oral Daily  . enoxaparin (LOVENOX) injection  40 mg Subcutaneous Q24H  . escitalopram  10 mg Oral Daily  . fluticasone  1 spray Each Nare BID  . gabapentin  300 mg Oral TID  . mometasone-formoterol  2 puff Inhalation BID  . montelukast  10 mg Oral QHS  . polyvinyl alcohol  1 drop Both Eyes BID   Continuous Infusions: . sodium chloride 75 mL/hr at 05/09/16 0108     LOS: 1 day   Time Spent in minutes   30 minutes  Lakeya Mulka D.O. on 05/09/2016 at 11:11 AM  Between 7am to 7pm - Pager - [phone]  After 7pm go to [email or web address] - password [email or web address]  And look for the night coverage person covering for me after hours  [facility] Office  [phone]

## 2016-05-09 NOTE — Evaluation (Signed)
Physical Therapy Evaluation Patient Details Name: [name] MRN: [id number] DOB: [birth year] Today's Date: 05/09/2016   History of Present Illness   CARIANN KINNAMON is a 83 y.o. female with a medical history of asthma, COPD, depression and anxiety, hypertension, chronic neck pain, who presented to the emergency department with complaints of shortness of breath.  CXR shows PNA.  Clinical Impression  Pt admitted with/for SOB, imaging showing PNA.  Pt currently limited functionally due to the problems listed below.  (see problems list.)  Pt will benefit from PT to maximize function and safety to be able to get home safely with available assist.     Follow Up Recommendations Home health PT    Equipment Recommendations  None recommended by PT    Recommendations for Other Services       Precautions / Restrictions Precautions Precautions: Fall      Mobility  Bed Mobility Overal bed mobility: Needs Assistance Bed Mobility: Supine to Sit     Supine to sit: Mod assist     General bed mobility comments: pt struggled and needed extra time to get to EOB  Transfers Overall transfer level: Needs assistance Equipment used: Rolling walker (2 wheeled) Transfers: Sit to/from [facility] Sit to Stand: Min assist Stand pivot transfers: Min assist       General transfer comment: cues for hand placement and technique.  Assist coming forward.  Ambulation/Gait Ambulation/Gait assistance: Min assist Ambulation Distance (Feet): 15 Feet Assistive device: Rolling walker (2 wheeled) Gait Pattern/deviations: Step-through pattern   Gait velocity interpretation: Below normal speed for age/gender General Gait Details: generally unsteady with short steps, narrowed or scissored stance at times  Stairs            Wheelchair Mobility    Modified Rankin (Stroke Patients Only)       Balance Overall balance assessment: Needs assistance Sitting-balance support: Feet  supported;Single extremity supported;No upper extremity supported Sitting balance-Leahy Scale: Fair     Standing balance support: Bilateral upper extremity supported;Single extremity supported Standing balance-Leahy Scale: Poor Standing balance comment: needs support  and/or AD                             Pertinent Vitals/Pain Pain Assessment: Faces Faces Pain Scale: Hurts a little bit Pain Location: vague Pain Descriptors / Indicators: Grimacing Pain Intervention(s): Limited activity within patient's tolerance;Monitored during session    Home Living Family/patient expects to be discharged to:: Assisted living Living Arrangements: Spouse/significant other Available Help at Discharge:  (limited help for bath) Type of Home: Apartment Home Access: Elevator     Home Layout: One level Home Equipment: Wheelchair - [employer] - 2 wheels Additional Comments: [facility]    Prior Function Level of Independence: Needs assistance   Gait / Transfers Assistance Needed: self propels her w/c  ADL's / Homemaking Assistance Needed: can sponge bathe and toilet from the w/c        Hand Dominance        Extremity/Trunk Assessment   Upper Extremity Assessment Upper Extremity Assessment: Generalized weakness    Lower Extremity Assessment Lower Extremity Assessment: Generalized weakness (minimally ambulatory)    Cervical / Trunk Assessment Cervical / Trunk Assessment: Kyphotic  Communication   Communication: No difficulties  Cognition Arousal/Alertness: Awake/alert Behavior During Therapy: WFL for tasks assessed/performed Overall Cognitive Status: Within Functional Limits for tasks assessed  General Comments General comments (skin integrity, edema, etc.): Sats during gait/mobility 93% at 110's bpm and dyspnea with trouble exhaling    Exercises     Assessment/Plan    PT Assessment Patient needs  continued PT services  PT Problem List Decreased strength;Decreased activity tolerance;Decreased balance;Decreased mobility;Cardiopulmonary status limiting activity       PT Treatment Interventions Gait training;Functional mobility training;Therapeutic activities;Balance training;Patient/family education    PT Goals (Current goals can be found in the Care Plan section)  Acute Rehab PT Goals Patient Stated Goal: get stronger, continue HHPT PT Goal Formulation: With patient Time For Goal Achievement: 05/23/16 Potential to Achieve Goals: Fair    Frequency Min 3X/week   Barriers to discharge        Co-evaluation               End of Session   Activity Tolerance: Patient tolerated treatment well;Patient limited by fatigue Patient left: in bed;with call bell/phone within reach;with bed alarm set Nurse Communication: Mobility status PT Visit Diagnosis: Unsteadiness on feet (R26.81);Other abnormalities of gait and mobility (R26.89);Repeated falls (R29.6);Muscle weakness (generalized) (M62.81)    Time: 4163-8453 PT Time Calculation (min) (ACUTE ONLY): 32 min   Charges:   PT Evaluation $PT Eval Moderate Complexity: 1 Procedure PT Treatments $Therapeutic Activity: 8-22 mins   PT G CodesTessie Fass Yordy Matton 05/09/2016, 5:49 PM 05/09/2016  Donnella Sham, [place] [phone]  (pager)

## 2016-05-10 LAB — BASIC METABOLIC PANEL
Anion gap: 7 (ref 5–15)
BUN: 8 mg/dL (ref 6–20)
CHLORIDE: 107 mmol/L (ref 101–111)
CO2: 25 mmol/L (ref 22–32)
CREATININE: 0.54 mg/dL (ref 0.44–1.00)
Calcium: 8.2 mg/dL — ABNORMAL LOW (ref 8.9–10.3)
GFR calc Af Amer: >60 mL/min (ref >60)
GFR calc non Af Amer: >60 mL/min (ref >60)
GLUCOSE: 93 mg/dL (ref 65–99)
POTASSIUM: 3.5 mmol/L (ref 3.5–5.1)
SODIUM: 139 mmol/L (ref 135–145)

## 2016-05-10 LAB — URINE CULTURE

## 2016-05-10 LAB — CBC
HCT: 35.3 % — ABNORMAL LOW (ref 36.0–46.0)
HEMOGLOBIN: 11.1 g/dL — AB (ref 12.0–15.0)
MCH: 30.7 pg (ref 26.0–34.0)
MCHC: 31.4 g/dL (ref 30.0–36.0)
MCV: 97.5 fL (ref 78.0–100.0)
PLATELETS: 164 10*3/uL (ref 150–400)
RBC: 3.62 MIL/uL — ABNORMAL LOW (ref 3.87–5.11)
RDW: 13.9 % (ref 11.5–15.5)
WBC: 6 10*3/uL (ref 4.0–10.5)

## 2016-05-10 LAB — EXPECTORATED SPUTUM ASSESSMENT W GRAM STAIN, RFLX TO RESP C

## 2016-05-10 LAB — HIV ANTIBODY (ROUTINE TESTING W REFLEX): HIV SCREEN 4TH GENERATION: NONREACTIVE

## 2016-05-10 LAB — EXPECTORATED SPUTUM ASSESSMENT W REFEX TO RESP CULTURE

## 2016-05-10 MED ORDER — SALINE SPRAY 0.65 % NA SOLN
1.0000 | NASAL | Status: DC | PRN
  Filled 2016-05-10: qty 44

## 2016-05-10 MED ORDER — CLONAZEPAM 1 MG PO TABS: 1.0000 mg | ORAL_TABLET | Freq: Every evening | ORAL | 0 refills | Status: DC | PRN

## 2016-05-10 MED ORDER — AZITHROMYCIN 500 MG PO TABS: 500.0000 mg | ORAL_TABLET | Freq: Every day | ORAL | 0 refills | Status: DC

## 2016-05-10 MED ORDER — MUPIROCIN 2 % EX OINT: 1.0000 "application " | TOPICAL_OINTMENT | Freq: Two times a day (BID) | CUTANEOUS | 0 refills | Status: DC

## 2016-05-10 MED ORDER — MENTHOL 3 MG MT LOZG
1.0000 | LOZENGE | OROMUCOSAL | Status: DC | PRN
  Administered 2016-05-10: 3 mg via ORAL
  Filled 2016-05-10: qty 9

## 2016-05-10 MED ORDER — HYDROCODONE-ACETAMINOPHEN 5-325 MG PO TABS: 1.0000 | ORAL_TABLET | Freq: Four times a day (QID) | ORAL | 0 refills | Status: DC | PRN

## 2016-05-10 MED ORDER — CEFUROXIME AXETIL 250 MG PO TABS
250.0000 mg | ORAL_TABLET | Freq: Two times a day (BID) | ORAL | 0 refills | Status: DC
Start: 2016-05-10 — End: 2016-07-11

## 2016-05-10 NOTE — Clinical Social Work Placement (Signed)
   CLINICAL SOCIAL WORK PLACEMENT  NOTE  Date:  05/10/2016  Patient Details  Name: [name] MRN: [id number] Date of Birth: 11/17/1930  Clinical Social Work is seeking post-discharge placement for this patient at the [place] level of care (*CSW will initial, date and re-position this form in  chart as items are completed):  Yes   Patient/family provided with [place] Work Department's list of facilities offering this level of care within the geographic area requested by the patient (or if unable, by the patient's family).  Yes   Patient/family informed of their freedom to choose among providers that offer the needed level of care, that participate in Medicare, Medicaid or managed care program needed by the patient, have an available bed and are willing to accept the patient.  Yes   Patient/family informed of [place] [facility]'s ownership interest in [facility] and [facility], as well as of the fact that they are under no obligation to receive care at these facilities.  PASRR submitted to EDS on       PASRR number received on       Existing PASRR number confirmed on 05/10/16     FL2 transmitted to all facilities in geographic area requested by pt/family on       FL2 transmitted to all facilities within larger geographic area on       Patient informed that his/her managed care company has contracts with or will negotiate with certain facilities, including the following:            Patient/family informed of bed offers received.  Patient chooses bed at  (Pt from [facility])     Physician recommends and patient chooses bed at      Patient to be transferred to  [facility]) on 05/10/16.  Patient to be transferred to facility by  [facility])     Patient family notified on 05/10/16 of transfer.  Name of family member notified:  Dtr @ bedside     PHYSICIAN       Additional Comment:     _______________________________________________ Serafina Mitchell, [place] 05/10/2016, 2:07 PM

## 2016-05-10 NOTE — Clinical Social Work Note (Signed)
Clinical Social Worker facilitated patient discharge including contacting patient family and facility to confirm patient discharge plans.  Clinical information faxed to facility and family agreeable with plan.  CSW arranged ambulance transport via [facility] to [facility]. RN to call report prior to discharge.  Clinical Social Worker will sign off for now as social work intervention is no longer needed. Please consult [place] again if new need arises.  Sofi Bryars B. Joline Maxcy Clinical Social Work Dept Weekend Social Worker [phone] 2:05 PM

## 2016-05-10 NOTE — Discharge Summary (Signed)
Physician Discharge Summary  [name] KDT:[id number] DOB: 1930/05/04 DOA: 05/08/2016  PCP: Sheela Stack, MD  Admit date: 05/08/2016 Discharge date: 05/10/2016  Time spent: 45 minutes  Recommendations for Outpatient Follow-up:  Patient will be discharged to [facility] with home health physical therapy.  Patient will need to follow up with primary care provider within one week of discharge and discuss blood pressure management and other medications.Patient should continue medications as prescribed.  Patient should follow a heart healthy.  Discharge Diagnoses:  Sepsis secondary to community acquired pneumonia Dyspnea secondary to pneumonia Hypotension Falls Lower extremity wounds Asthma/COPD Chronic neck pain Depression/Anxiety Nasal congestion  Discharge Condition: Stable  Diet recommendation: heart healthy  Filed Weights   05/08/16 1035 05/09/16 0158 05/09/16 2006  Weight: 83.9 kg (185 lb) 93 kg (205 lb 1.6 oz) 93.4 kg (205 lb 14.6 oz)    History of present illness:  On 05/08/2016  [name] a 83 y.o.femalewith a medical history of asthma, COPD, depression and anxiety, hypertension, chronic neck pain, who presented to the emergency department with complaints of shortness of breath. Patient states this has been ongoing for approximately 2 weeks. She has also had postnasal drip and nasal congestion for the last several days. She hasalso had fever. Patient does complain of feeling weak recently. She has only had a dry cough. Nothing seems to make her symptoms better or worse. Currently she denies any chest pain, abdominal pain, nausea or vomiting, diarrhea constipation, problems urinating, dizziness or headache. Patient denies any recent travel or ill contacts. Of note, she has been complaining of falling lately. She currently resides in assisted living facility.  Hospital Course:  Sepsis secondary to community acquired pneumonia -Upon  admission, patient was noted to be febrile, tachycardic, tachypneic, with mild hypotension (responding to IVF) -CXR patchy density in left lung base -Lactic acid mildly elevated 2.05 upon admission, has resolved, currently 0.7 -Sepsis and pneumonia protocol utilized on admission -Was placed on ceftriaxone and azithromycin, will discharge with azithromycin and ceftin -pending sputum culture -strep pneumonia urine antigen negative -Blood cultures show no growth to date -Influenza PCR negative -Patient progressed and improved quickly. Currently afebrile, no leukocytosis, tachycardia/tachypnea resolved  Dyspnea secondary to pneumonia -Resolved, currently O2 sats 98% on room air  Hypotension -Possibly secondary to sepsis and medications, volume depletion. -Patient has been taking Benicar HCTZ. Patient states that her BP normally runs low and she is only supposed to take this medication if her BP >132/90. However she has been receiving it daily at the ALF. -BP responding to IVF and has improved, now 120/81 -Will hold benicar at discharge. Patient will need to follow up with PCP for medication recommendations. This was discussed with the daughter and patient.  Falls -Patient has had several falls lately. ?if this is due to hypotension -PT consulted and recommended HH   Lower extremity wounds -Wound care consulted  Asthma/COPD -Currently no wheezing -Continue home medications, Albuterol, Dulera  Chronic neck pain -Continue hydrocodone, robaxin, PRN and gabapentin   Depression/Anxiety -Continue wellbutrin, lexapro, klonopin  Nasal congestion -Continue flonase  Consultants None  Procedures  None  Discharge Exam: Vitals:   05/10/16 0432 05/10/16 1000  BP: 124/66 120/81  Pulse: 85 91  Resp: 18 20  Temp: 98.7 F (37.1 C) 98.8 F (37.1 C)   Feels breathing is better, but continues to cough. Denies chest pain, abdominal pain, nausea or vomiting. Continues to have  neck pain.  Exam  General: Well developed, well nourished,  NAD, appears stated age  39: NCAT, mucous membranes moist.   Cardiovascular: S1 S2 auscultated, RRR  Respiratory: Diminished but clear breath sounds, occ cough  Abdomen: Soft, nontender, nondistended, + bowel sounds  Extremities: warm dry without cyanosis clubbing. +trace LE edema B/L, wounds noted  Neuro: AAOx3, nonfocal  Discharge Instructions Discharge Instructions    Discharge instructions    Complete by:  As directed    Patient will be discharged to [facility] with home health physical therapy.  Patient will need to follow up with primary care provider within one week of discharge and discuss blood pressure management and other medications.Patient should continue medications as prescribed.  Patient should follow a heart healthy.     Current Discharge Medication List    START taking these medications   Details  azithromycin (ZITHROMAX) 500 MG tablet Take 1 tablet (500 mg total) by mouth daily. Qty: 5 tablet, Refills: 0    cefUROXime (CEFTIN) 250 MG tablet Take 1 tablet (250 mg total) by mouth 2 (two) times daily with a meal. Qty: 10 tablet, Refills: 0    mupirocin ointment (BACTROBAN) 2 % Place 1 application into the nose 2 (two) times daily. Use through 05/14/2016 Qty: 22 g, Refills: 0      CONTINUE these medications which have CHANGED   Details  clonazePAM (KLONOPIN) 1 MG tablet Take 1 tablet (1 mg total) by mouth at bedtime as needed (insomnia). Qty: 10 tablet, Refills: 0   Associated Diagnoses: Insomnia    HYDROcodone-acetaminophen (NORCO/VICODIN) 5-325 MG tablet Take 1 tablet by mouth every 6 (six) hours as needed for moderate pain. Qty: 10 tablet, Refills: 0      CONTINUE these medications which have NOT CHANGED   Details  !! acetaminophen (TYLENOL) 325 MG tablet Take 2 tablets (650 mg total) by mouth every 6 (six) hours as needed for mild pain (or Fever >/= 101). Qty: 20  tablet, Refills: 0    !! acetaminophen (TYLENOL) 500 MG tablet Take 1,000 mg by mouth every 8 (eight) hours as needed for mild pain.    albuterol (PROAIR HFA) 108 (90 BASE) MCG/ACT inhaler Inhale 2 puffs into the lungs every 4 (four) hours as needed for shortness of breath.     aspirin EC 81 MG tablet Take 81 mg by mouth daily.    buPROPion (WELLBUTRIN XL) 300 MG 24 hr tablet Take 300 mg by mouth every morning.     Calcium Carb-Cholecalciferol (CALCIUM+D3) 600-800 MG-UNIT TABS Take 1 tablet by mouth daily.    Cholecalciferol (VITAMIN D) 2000 units tablet Take 2,000 Units by mouth daily.    docusate sodium (COLACE) 100 MG capsule Take 100 mg by mouth daily.    escitalopram (LEXAPRO) 10 MG tablet Take 10 mg by mouth daily.     fluticasone (FLONASE) 50 MCG/ACT nasal spray Place 1 spray into both nostrils 2 (two) times daily.    gabapentin (NEURONTIN) 300 MG capsule Take 1 capsule (300 mg total) by mouth 3 (three) times daily. Qty: 270 capsule, Refills: 3   Associated Diagnoses: Malignant neoplasm of central portion of right female breast (HCC)    guaiFENesin (MUCINEX) 600 MG 12 hr tablet Take 600 mg by mouth 2 (two) times daily as needed for cough or to loosen phlegm.    Homeopathic Products ([facility]) CREA Apply 1 application topically as needed (muscle pain/stiffness).    Ibuprofen (ADVIL) 200 MG CAPS Take 400 mg by mouth every 12 (twelve) hours as needed (pain).  methocarbamol (ROBAXIN) 500 MG tablet Take 1 tablet (500 mg total) by mouth every 6 (six) hours as needed for muscle spasms. Qty: 90 tablet, Refills: 0    mometasone-formoterol (DULERA) 100-5 MCG/ACT AERO Inhale 2 puffs into the lungs 2 (two) times daily.    montelukast (SINGULAIR) 10 MG tablet Take 10 mg by mouth at bedtime.      ondansetron (ZOFRAN) 4 MG tablet Take 4 mg by mouth every 8 (eight) hours as needed for nausea or vomiting.    polyethylene glycol (MIRALAX / GLYCOLAX) packet Take 17 g by mouth  daily as needed for mild constipation. Qty: 14 each, Refills: 0    Propylene Glycol-Glycerin (SOOTHE) 0.6-0.6 % SOLN Apply 1 drop to eye 2 (two) times daily. Qty: 1 each, Refills: 0    sodium phosphate (FLEET) 7-19 GM/118ML ENEM Place 133 mLs (1 enema total) rectally once as needed for severe constipation. Qty: 2 Bottle, Refills: 0     !! - Potential duplicate medications found. Please discuss with provider.    STOP taking these medications     olmesartan-hydrochlorothiazide (BENICAR HCT) 20-12.5 MG per tablet      traMADol (ULTRAM) 50 MG tablet        Allergies  Allergen Reactions  . Oxycodone Hcl [Oxycodone Hcl] Rash  . Morphine Sulfate Other (See Comments)    REACTION: very anxious  . Sulfamethoxazole Other (See Comments)    REACTION: unspecified   Follow-up Information    Sheela Stack, MD. Schedule an appointment as soon as possible for a visit in 1 week(s).   Specialty:  Endocrinology Why:  Hospital follow up Contact information: [address] [place] [address] [phone]            The results of significant diagnostics from this hospitalization (including imaging, microbiology, ancillary and laboratory) are listed below for reference.    Significant Diagnostic Studies: Dg Chest Port 1 View  Result Date: 05/08/2016 CLINICAL DATA:  Productive cough for 2 weeks. EXAM: PORTABLE CHEST 1 VIEW COMPARISON:  07/04/2015 FINDINGS: Heart is normal size. Patchy density at the left base, atelectasis versus developing infiltrate. No confluent opacity on the right. No effusions or acute bony abnormality. IMPRESSION: Patchy density at the left base, atelectasis versus developing infiltrate. Electronically Signed   By: Rolm Baptise M.D.   On: 05/08/2016 11:14    Microbiology: Recent Results (from the past 240 hour(s))  Blood Culture (routine x 2)     Status: None (Preliminary result)   Collection Time: 05/08/16 11:00 AM  Result Value Ref Range Status    Specimen Description BLOOD LEFT HAND  Final   Special Requests IN PEDIATRIC BOTTLE  2CC  Final   Culture NO GROWTH 1 DAY  Final   Report Status PENDING  Incomplete  Blood Culture (routine x 2)     Status: None (Preliminary result)   Collection Time: 05/08/16 11:08 AM  Result Value Ref Range Status   Specimen Description BLOOD LEFT ANTECUBITAL  Final   Special Requests BOTTLES DRAWN AEROBIC AND ANAEROBIC  5CC  Final   Culture NO GROWTH 1 DAY  Final   Report Status PENDING  Incomplete  Urine culture     Status: Abnormal   Collection Time: 05/08/16  7:29 PM  Result Value Ref Range Status   Specimen Description URINE, CLEAN CATCH  Final   Special Requests NONE  Final   Culture MULTIPLE SPECIES PRESENT, SUGGEST RECOLLECTION (A)  Final   Report Status 05/10/2016 FINAL  Final  MRSA  PCR Screening     Status: Abnormal   Collection Time: 05/09/16  5:35 AM  Result Value Ref Range Status   MRSA by PCR POSITIVE (A) NEGATIVE Final    Comment:        The GeneXpert MRSA Assay (FDA approved for NASAL specimens only), is one component of a comprehensive MRSA colonization surveillance program. It is not intended to diagnose MRSA infection nor to guide or monitor treatment for MRSA infections. RESULT CALLED TO, READ BACK BY AND VERIFIED WITH: T.[facility] RN AT 1610 05/09/16 BY A.DAVIS   Culture, sputum-assessment     Status: None (Preliminary result)   Collection Time: 05/10/16  7:29 AM  Result Value Ref Range Status   Specimen Description SPUTUM  Final   Special Requests NONE  Final   Sputum evaluation THIS SPECIMEN IS ACCEPTABLE FOR SPUTUM CULTURE  Final   Report Status PENDING  Incomplete     Labs: Basic Metabolic Panel:  Recent Labs Lab 05/08/16 1100 05/09/16 0121 05/10/16 0520  NA 138 140  140 139  K 4.4 4.1  4.0 3.5  CL 101 106  107 107  CO2 29 26  25 25   GLUCOSE 110* 97  100* 93  BUN 16 10  9 8   CREATININE 0.74 0.62  0.61 0.54  CALCIUM 9.2 8.7*  8.6* 8.2*   Liver  Function Tests:  Recent Labs Lab 05/08/16 1100 05/09/16 0121  AST 25 19  ALT 17 16  ALKPHOS 70 52  BILITOT 1.0 0.8  PROT 6.9 5.5*  ALBUMIN 3.4* 3.2*   No results for input(s): LIPASE, AMYLASE in the last 168 hours. No results for input(s): AMMONIA in the last 168 hours. CBC:  Recent Labs Lab 05/08/16 1100 05/09/16 0121 05/10/16 0520  WBC 10.5 11.3* 6.0  NEUTROABS 9.0* 9.4*  --   HGB 13.4 11.8* 11.1*  HCT 40.3 35.7* 35.3*  MCV 98.5 97.8 97.5  PLT 171 162 164   Cardiac Enzymes: No results for input(s): CKTOTAL, CKMB, CKMBINDEX, TROPONINI in the last 168 hours. BNP: BNP (last 3 results) No results for input(s): BNP in the last 8760 hours.  ProBNP (last 3 results) No results for input(s): PROBNP in the last 8760 hours.  CBG: No results for input(s): GLUCAP in the last 168 hours.     SignedCristal Ford  [facility] 05/10/2016, 10:17 AM

## 2016-05-10 NOTE — Progress Notes (Signed)
Pt prepared for d/c to ALF. IV d/c'd. Skin intact except as charted in most recent assessments. Vitals are stable. Pt to be transported by ambulance service.  Jillyn Ledger, MBA, BSN, RN

## 2016-05-10 NOTE — Discharge Instructions (Signed)
Community-Acquired Pneumonia, Adult °Pneumonia is an infection of the lungs. One type of pneumonia can happen while a person is in a hospital. A different type can happen when a person is not in a hospital (community-acquired pneumonia). It is easy for this kind to spread from person to person. It can spread to you if you breathe near an infected person who coughs or sneezes. Some symptoms include: °· A dry cough. °· A wet (productive) cough. °· Fever. °· Sweating. °· Chest pain. °Follow these instructions at home: °· Take over-the-counter and prescription medicines only as told by your doctor. °¨ Only take cough medicine if you are losing sleep. °¨ If you were prescribed an antibiotic medicine, take it as told by your doctor. Do not stop taking the antibiotic even if you start to feel better. °· Sleep with your head and neck raised (elevated). You can do this by putting a few pillows under your head, or you can sleep in a recliner. °· Do not use tobacco products. These include cigarettes, chewing tobacco, and e-cigarettes. If you need help quitting, ask your doctor. °· Drink enough water to keep your pee (urine) clear or pale yellow. °A shot (vaccine) can help prevent pneumonia. Shots are often suggested for: °· People older than 81 years of age. °· People older than 81 years of age: °¨ Who are having cancer treatment. °¨ Who have long-term (chronic) lung disease. °¨ Who have problems with their body's defense system (immune system). °You may also prevent pneumonia if you take these actions: °· Get the flu (influenza) shot every year. °· Go to the dentist as often as told. °· Wash your hands often. If soap and water are not available, use hand sanitizer. °Contact a doctor if: °· You have a fever. °· You lose sleep because your cough medicine does not help. °Get help right away if: °· You are short of breath and it gets worse. °· You have more chest pain. °· Your sickness gets worse. This is very serious if: °¨ You  are an older adult. °¨ Your body's defense system is weak. °· You cough up blood. °This information is not intended to replace advice given to you by your health care provider. Make sure you discuss any questions you have with your health care provider. °Document Released: 07/22/2007 Document Revised: 07/11/2015 Document Reviewed: 05/30/2014 °Elsevier Interactive Patient Education © 2017 [employer] ° °

## 2016-05-10 NOTE — NC FL2 (Signed)
[place] LEVEL OF CARE SCREENING TOOL     IDENTIFICATION  Patient Name: [name] Birthdate: 12-09-1930 Sex: female Admission Date (Current Location): 05/08/2016  [facility] and [place] Number:  Herbalist and Address:  The . [facility], [place] [address], [place], [place] [address]      Provider Number: [id number]  Attending Physician Name and Address:  Cristal Ford, DO  Relative Name and Phone Number:       Current Level of Care: Hospital Recommended Level of Care: [place] Prior Approval Number:    Date Approved/Denied:   PASRR Number: [id number]  Discharge Plan: Other (Comment) ([facility])    Current Diagnoses: Patient Active Problem List   Diagnosis Date Noted  . Sepsis ([place]) 05/08/2016  . Community acquired pneumonia 05/08/2016  . Failed total hip arthroplasty ([place]) 07/10/2015  . Breast cancer, right breast ([place]) 07/18/2014  . CONCUSSION WITH LOC OF UNSPECIFIED DURATION 07/19/2009  . OTH SYMPTOMS INVOLVING RESPIRATORY SYSTEM&CHEST 11/12/2008  . UNSPECIFIED DISORDER OF ANKLE AND FOOT JOINT 05/11/2008  . CERUMEN IMPACTION, BILATERAL 05/04/2008  . DYSMETABOLIC SYNDROME X 38/25/0539  . UNS ADVRS EFF UNS RX MEDICINAL&BIOLOGICAL SBSTNC 08/12/2007  . Other and unspecified hyperlipidemia 04/01/2007  . INSOMNIA, PERSISTENT 09/09/2006  . Depression 09/09/2006  . Essential hypertension 09/09/2006  . Asthma 09/09/2006  . DIVERTICULOSIS, COLON 09/09/2006  . OSTEOARTHRITIS 09/09/2006  . STENOSIS, LUMBAR SPINE 09/09/2006  . COLONIC POLYPS, HX OF 09/09/2006    Orientation RESPIRATION BLADDER Height & Weight     Self, Time, Situation, Place  Normal Continent Weight: 205 lb 14.6 oz (93.4 kg) Height:  5\' 6"  (167.6 cm)  BEHAVIORAL SYMPTOMS/MOOD NEUROLOGICAL BOWEL NUTRITION STATUS      Continent (S) Diet (See DC Summary)  AMBULATORY STATUS COMMUNICATION OF NEEDS Skin   Limited Assist Verbally  Normal                       Personal Care Assistance Level of Assistance  Bathing Bathing Assistance: Limited assistance         Functional Limitations Info  Sight, Hearing, Speech Sight Info: Adequate Hearing Info: Adequate Speech Info: Adequate    SPECIAL CARE FACTORS FREQUENCY  PT (By licensed PT), OT (By licensed OT)     PT Frequency: 5x wk OT Frequency: 5x wk            Contractures Contractures Info: Not present    Additional Factors Info  Code Status, Allergies Code Status Info: Full Allergies Info: Oxycodone Hcl Oxycodone Hcl, Morphine Sulfate, Sulfamethoxazole           Discharge Medications: Please see discharge summary for a list of discharge medications.  Current Discharge Medication List        START taking these medications   Details  azithromycin (ZITHROMAX) 500 MG tablet Take 1 tablet (500 mg total) by mouth daily. Qty: 5 tablet, Refills: 0    cefUROXime (CEFTIN) 250 MG tablet Take 1 tablet (250 mg total) by mouth 2 (two) times daily with a meal. Qty: 10 tablet, Refills: 0    mupirocin ointment (BACTROBAN) 2 % Place 1 application into the nose 2 (two) times daily. Use through 05/14/2016 Qty: 22 g, Refills: 0          CONTINUE these medications which have CHANGED   Details  clonazePAM (KLONOPIN) 1 MG tablet Take 1 tablet (1 mg total) by mouth at bedtime as needed (insomnia). Qty: 10 tablet,  Refills: 0   Associated Diagnoses: Insomnia    HYDROcodone-acetaminophen (NORCO/VICODIN) 5-325 MG tablet Take 1 tablet by mouth every 6 (six) hours as needed for moderate pain. Qty: 10 tablet, Refills: 0          CONTINUE these medications which have NOT CHANGED   Details  !! acetaminophen (TYLENOL) 325 MG tablet Take 2 tablets (650 mg total) by mouth every 6 (six) hours as needed for mild pain (or Fever >/= 101). Qty: 20 tablet, Refills: 0    !! acetaminophen (TYLENOL) 500 MG tablet Take 1,000 mg by mouth every 8 (eight)  hours as needed for mild pain.    albuterol (PROAIR HFA) 108 (90 BASE) MCG/ACT inhaler Inhale 2 puffs into the lungs every 4 (four) hours as needed for shortness of breath.     aspirin EC 81 MG tablet Take 81 mg by mouth daily.    buPROPion (WELLBUTRIN XL) 300 MG 24 hr tablet Take 300 mg by mouth every morning.     Calcium Carb-Cholecalciferol (CALCIUM+D3) 600-800 MG-UNIT TABS Take 1 tablet by mouth daily.    Cholecalciferol (VITAMIN D) 2000 units tablet Take 2,000 Units by mouth daily.    docusate sodium (COLACE) 100 MG capsule Take 100 mg by mouth daily.    escitalopram (LEXAPRO) 10 MG tablet Take 10 mg by mouth daily.     fluticasone (FLONASE) 50 MCG/ACT nasal spray Place 1 spray into both nostrils 2 (two) times daily.    gabapentin (NEURONTIN) 300 MG capsule Take 1 capsule (300 mg total) by mouth 3 (three) times daily. Qty: 270 capsule, Refills: 3   Associated Diagnoses: Malignant neoplasm of central portion of right female breast (HCC)    guaiFENesin (MUCINEX) 600 MG 12 hr tablet Take 600 mg by mouth 2 (two) times daily as needed for cough or to loosen phlegm.    Homeopathic Products (ARNICARE ARNICA) CREA Apply 1 application topically as needed (muscle pain/stiffness).    Ibuprofen (ADVIL) 200 MG CAPS Take 400 mg by mouth every 12 (twelve) hours as needed (pain).    methocarbamol (ROBAXIN) 500 MG tablet Take 1 tablet (500 mg total) by mouth every 6 (six) hours as needed for muscle spasms. Qty: 90 tablet, Refills: 0    mometasone-formoterol (DULERA) 100-5 MCG/ACT AERO Inhale 2 puffs into the lungs 2 (two) times daily.    montelukast (SINGULAIR) 10 MG tablet Take 10 mg by mouth at bedtime.      ondansetron (ZOFRAN) 4 MG tablet Take 4 mg by mouth every 8 (eight) hours as needed for nausea or vomiting.    polyethylene glycol (MIRALAX / GLYCOLAX) packet Take 17 g by mouth daily as needed for mild constipation. Qty: 14 each, Refills: 0    Propylene  Glycol-Glycerin (SOOTHE) 0.6-0.6 % SOLN Apply 1 drop to eye 2 (two) times daily. Qty: 1 each, Refills: 0    sodium phosphate (FLEET) 7-19 GM/118ML ENEM Place 133 mLs (1 enema total) rectally once as needed for severe constipation. Qty: 2 Bottle, Refills: 0     !! - Potential duplicate medications found. Please discuss with provider.       STOP taking these medications     olmesartan-hydrochlorothiazide (BENICAR HCT) 20-12.5 MG per tablet      traMADol (ULTRAM) 50 MG tablet        Relevant Imaging Results:  Relevant Lab Results:   Additional Information [phone] Siara Gorder B, LCSWA

## 2016-05-12 LAB — CULTURE, RESPIRATORY: CULTURE: NORMAL

## 2016-05-12 LAB — CULTURE, RESPIRATORY W GRAM STAIN

## 2016-05-13 LAB — CULTURE, BLOOD (ROUTINE X 2)
Culture: NO GROWTH
Culture: NO GROWTH

## 2016-07-07 ENCOUNTER — Encounter (HOSPITAL_COMMUNITY): Payer: Self-pay

## 2016-07-07 ENCOUNTER — Inpatient Hospital Stay (HOSPITAL_COMMUNITY)
Admission: EM | Admit: 2016-07-07 | Discharge: 2016-07-11 | DRG: 194 | Payer: Medicare Other | Attending: Internal Medicine | Admitting: Internal Medicine

## 2016-07-07 ENCOUNTER — Emergency Department (HOSPITAL_COMMUNITY): Payer: Medicare Other

## 2016-07-07 DIAGNOSIS — R509 Fever, unspecified: Secondary | ICD-10-CM

## 2016-07-07 DIAGNOSIS — Z87440 Personal history of urinary (tract) infections: Secondary | ICD-10-CM

## 2016-07-07 DIAGNOSIS — M4800 Spinal stenosis, site unspecified: Secondary | ICD-10-CM | POA: Diagnosis present

## 2016-07-07 DIAGNOSIS — Z7952 Long term (current) use of systemic steroids: Secondary | ICD-10-CM | POA: Diagnosis not present

## 2016-07-07 DIAGNOSIS — M8580 Other specified disorders of bone density and structure, unspecified site: Secondary | ICD-10-CM | POA: Diagnosis present

## 2016-07-07 DIAGNOSIS — Z66 Do not resuscitate: Secondary | ICD-10-CM | POA: Diagnosis present

## 2016-07-07 DIAGNOSIS — Z96641 Presence of right artificial hip joint: Secondary | ICD-10-CM | POA: Diagnosis present

## 2016-07-07 DIAGNOSIS — F329 Major depressive disorder, single episode, unspecified: Secondary | ICD-10-CM | POA: Diagnosis present

## 2016-07-07 DIAGNOSIS — J45901 Unspecified asthma with (acute) exacerbation: Secondary | ICD-10-CM | POA: Diagnosis present

## 2016-07-07 DIAGNOSIS — L97919 Non-pressure chronic ulcer of unspecified part of right lower leg with unspecified severity: Secondary | ICD-10-CM | POA: Diagnosis present

## 2016-07-07 DIAGNOSIS — E876 Hypokalemia: Secondary | ICD-10-CM | POA: Diagnosis present

## 2016-07-07 DIAGNOSIS — Z792 Long term (current) use of antibiotics: Secondary | ICD-10-CM | POA: Diagnosis not present

## 2016-07-07 DIAGNOSIS — G4700 Insomnia, unspecified: Secondary | ICD-10-CM | POA: Diagnosis present

## 2016-07-07 DIAGNOSIS — Z79899 Other long term (current) drug therapy: Secondary | ICD-10-CM | POA: Diagnosis not present

## 2016-07-07 DIAGNOSIS — E11622 Type 2 diabetes mellitus with other skin ulcer: Secondary | ICD-10-CM | POA: Diagnosis present

## 2016-07-07 DIAGNOSIS — Z9109 Other allergy status, other than to drugs and biological substances: Secondary | ICD-10-CM

## 2016-07-07 DIAGNOSIS — J189 Pneumonia, unspecified organism: Secondary | ICD-10-CM | POA: Diagnosis present

## 2016-07-07 DIAGNOSIS — Z882 Allergy status to sulfonamides status: Secondary | ICD-10-CM | POA: Diagnosis not present

## 2016-07-07 DIAGNOSIS — J440 Chronic obstructive pulmonary disease with acute lower respiratory infection: Secondary | ICD-10-CM | POA: Diagnosis present

## 2016-07-07 DIAGNOSIS — F32A Depression, unspecified: Secondary | ICD-10-CM | POA: Diagnosis present

## 2016-07-07 DIAGNOSIS — C50911 Malignant neoplasm of unspecified site of right female breast: Secondary | ICD-10-CM | POA: Diagnosis present

## 2016-07-07 DIAGNOSIS — R6510 Systemic inflammatory response syndrome (SIRS) of non-infectious origin without acute organ dysfunction: Secondary | ICD-10-CM | POA: Diagnosis present

## 2016-07-07 DIAGNOSIS — I10 Essential (primary) hypertension: Secondary | ICD-10-CM | POA: Diagnosis present

## 2016-07-07 DIAGNOSIS — Z7982 Long term (current) use of aspirin: Secondary | ICD-10-CM

## 2016-07-07 DIAGNOSIS — Z853 Personal history of malignant neoplasm of breast: Secondary | ICD-10-CM | POA: Diagnosis not present

## 2016-07-07 LAB — COMPREHENSIVE METABOLIC PANEL
ALK PHOS: 67 U/L (ref 38–126)
ALT: 14 U/L (ref 14–54)
AST: 16 U/L (ref 15–41)
Albumin: 3.5 g/dL (ref 3.5–5.0)
Anion gap: 9 (ref 5–15)
BILIRUBIN TOTAL: 1 mg/dL (ref 0.3–1.2)
BUN: 10 mg/dL (ref 6–20)
CALCIUM: 8.8 mg/dL — AB (ref 8.9–10.3)
CO2: 25 mmol/L (ref 22–32)
Chloride: 99 mmol/L — ABNORMAL LOW (ref 101–111)
Creatinine, Ser: 0.53 mg/dL (ref 0.44–1.00)
Glucose, Bld: 112 mg/dL — ABNORMAL HIGH (ref 65–99)
Potassium: 3.3 mmol/L — ABNORMAL LOW (ref 3.5–5.1)
Sodium: 133 mmol/L — ABNORMAL LOW (ref 135–145)
Total Protein: 6.6 g/dL (ref 6.5–8.1)

## 2016-07-07 LAB — CREATININE, SERUM
Creatinine, Ser: 0.56 mg/dL (ref 0.44–1.00)
GFR calc Af Amer: >60 mL/min (ref >60)
GFR calc non Af Amer: >60 mL/min (ref >60)

## 2016-07-07 LAB — GASTROINTESTINAL PANEL BY PCR, STOOL (REPLACES STOOL CULTURE)

## 2016-07-07 LAB — CBC WITH DIFFERENTIAL/PLATELET
Basophils Absolute: 0 10*3/uL (ref 0.0–0.1)
Basophils Relative: 0 %
EOS ABS: 0 10*3/uL (ref 0.0–0.7)
EOS PCT: 0 %
HCT: 40.7 % (ref 36.0–46.0)
Hemoglobin: 13.9 g/dL (ref 12.0–15.0)
LYMPHS ABS: 0.8 10*3/uL (ref 0.7–4.0)
Lymphocytes Relative: 5 %
MCH: 32.3 pg (ref 26.0–34.0)
MCHC: 34.2 g/dL (ref 30.0–36.0)
MCV: 94.7 fL (ref 78.0–100.0)
Monocytes Absolute: 1.8 10*3/uL — ABNORMAL HIGH (ref 0.1–1.0)
Monocytes Relative: 11 %
Neutro Abs: 14.2 10*3/uL — ABNORMAL HIGH (ref 1.7–7.7)
Neutrophils Relative %: 84 %
Platelets: 187 10*3/uL (ref 150–400)
RBC: 4.3 MIL/uL (ref 3.87–5.11)
RDW: 13.4 % (ref 11.5–15.5)
WBC: 16.9 10*3/uL — ABNORMAL HIGH (ref 4.0–10.5)

## 2016-07-07 LAB — CBC
HEMATOCRIT: 36.3 % (ref 36.0–46.0)
Hemoglobin: 12 g/dL (ref 12.0–15.0)
MCH: 31.7 pg (ref 26.0–34.0)
MCHC: 33.1 g/dL (ref 30.0–36.0)
MCV: 96 fL (ref 78.0–100.0)
PLATELETS: 153 10*3/uL (ref 150–400)
RBC: 3.78 MIL/uL — ABNORMAL LOW (ref 3.87–5.11)
RDW: 13.4 % (ref 11.5–15.5)
WBC: 14.5 10*3/uL — AB (ref 4.0–10.5)

## 2016-07-07 LAB — PROTIME-INR
INR: 1.18
Prothrombin Time: 15 seconds (ref 11.4–15.2)

## 2016-07-07 LAB — MRSA PCR SCREENING: MRSA by PCR: POSITIVE — AB

## 2016-07-07 LAB — I-STAT TROPONIN, ED: Troponin i, poc: 0.01 ng/mL (ref 0.00–0.08)

## 2016-07-07 LAB — C DIFFICILE QUICK SCREEN W PCR REFLEX
C DIFFICILE (CDIFF) TOXIN: NEGATIVE
C Diff antigen: NEGATIVE
C Diff interpretation: NOT DETECTED

## 2016-07-07 LAB — BRAIN NATRIURETIC PEPTIDE: B Natriuretic Peptide: 103.1 pg/mL — ABNORMAL HIGH (ref 0.0–100.0)

## 2016-07-07 LAB — I-STAT CG4 LACTIC ACID, ED: LACTIC ACID, VENOUS: 0.71 mmol/L (ref 0.5–1.9)

## 2016-07-07 MED ORDER — ORAL CARE MOUTH RINSE
15.0000 mL | Freq: Two times a day (BID) | OROMUCOSAL | Status: DC
  Administered 2016-07-07 – 2016-07-11 (×5): 15 mL via OROMUCOSAL

## 2016-07-07 MED ORDER — ACETAMINOPHEN 325 MG PO TABS
650.0000 mg | ORAL_TABLET | Freq: Once | ORAL | Status: AC
  Administered 2016-07-07: 650 mg via ORAL
  Filled 2016-07-07: qty 2

## 2016-07-07 MED ORDER — METHOCARBAMOL 500 MG PO TABS
500.0000 mg | ORAL_TABLET | Freq: Four times a day (QID) | ORAL | Status: DC | PRN
  Administered 2016-07-08 – 2016-07-10 (×5): 500 mg via ORAL
  Filled 2016-07-07 (×5): qty 1

## 2016-07-07 MED ORDER — CHLORHEXIDINE GLUCONATE 0.12 % MT SOLN
15.0000 mL | Freq: Two times a day (BID) | OROMUCOSAL | Status: DC
  Administered 2016-07-07 – 2016-07-11 (×8): 15 mL via OROMUCOSAL
  Filled 2016-07-07 (×7): qty 15

## 2016-07-07 MED ORDER — ONDANSETRON HCL 4 MG/2ML IJ SOLN: 4.0000 mg | Freq: Four times a day (QID) | INTRAMUSCULAR | Status: DC | PRN

## 2016-07-07 MED ORDER — VANCOMYCIN HCL IN DEXTROSE 750-5 MG/150ML-% IV SOLN
750.0000 mg | Freq: Two times a day (BID) | INTRAVENOUS | Status: DC
  Administered 2016-07-07 – 2016-07-08 (×3): 750 mg via INTRAVENOUS
  Filled 2016-07-07 (×4): qty 150

## 2016-07-07 MED ORDER — PIPERACILLIN-TAZOBACTAM 3.375 G IVPB 30 MIN
3.3750 g | Freq: Once | INTRAVENOUS | Status: AC
  Administered 2016-07-07: 3.375 g via INTRAVENOUS
  Filled 2016-07-07: qty 50

## 2016-07-07 MED ORDER — BUDESONIDE 0.25 MG/2ML IN SUSP
0.2500 mg | Freq: Two times a day (BID) | RESPIRATORY_TRACT | Status: DC
  Administered 2016-07-07 – 2016-07-10 (×5): 0.25 mg via RESPIRATORY_TRACT
  Filled 2016-07-07 (×7): qty 2

## 2016-07-07 MED ORDER — ENOXAPARIN SODIUM 40 MG/0.4ML ~~LOC~~ SOLN
40.0000 mg | SUBCUTANEOUS | Status: DC
  Administered 2016-07-07 – 2016-07-10 (×4): 40 mg via SUBCUTANEOUS
  Filled 2016-07-07 (×4): qty 0.4

## 2016-07-07 MED ORDER — BUPROPION HCL ER (XL) 150 MG PO TB24
300.0000 mg | ORAL_TABLET | Freq: Every day | ORAL | Status: DC
  Administered 2016-07-07 – 2016-07-11 (×5): 300 mg via ORAL
  Filled 2016-07-07 (×5): qty 2

## 2016-07-07 MED ORDER — ASPIRIN EC 325 MG PO TBEC
325.0000 mg | DELAYED_RELEASE_TABLET | Freq: Every day | ORAL | Status: DC
  Administered 2016-07-07 – 2016-07-11 (×5): 325 mg via ORAL
  Filled 2016-07-07 (×5): qty 1

## 2016-07-07 MED ORDER — POTASSIUM CHLORIDE CRYS ER 20 MEQ PO TBCR
40.0000 meq | EXTENDED_RELEASE_TABLET | Freq: Once | ORAL | Status: AC
  Administered 2016-07-07: 40 meq via ORAL
  Filled 2016-07-07: qty 2

## 2016-07-07 MED ORDER — ACETAMINOPHEN 325 MG PO TABS
650.0000 mg | ORAL_TABLET | Freq: Four times a day (QID) | ORAL | Status: DC | PRN
Start: 2016-07-07 — End: 2016-07-11
  Administered 2016-07-08 – 2016-07-09 (×2): 650 mg via ORAL
  Filled 2016-07-07 (×2): qty 2

## 2016-07-07 MED ORDER — CLONAZEPAM 0.5 MG PO TABS
1.0000 mg | ORAL_TABLET | Freq: Every evening | ORAL | Status: DC | PRN
  Administered 2016-07-08 – 2016-07-10 (×3): 1 mg via ORAL
  Filled 2016-07-07 (×3): qty 2

## 2016-07-07 MED ORDER — METHYLPREDNISOLONE SODIUM SUCC 125 MG IJ SOLR
60.0000 mg | Freq: Three times a day (TID) | INTRAMUSCULAR | Status: DC
  Administered 2016-07-07 – 2016-07-08 (×3): 60 mg via INTRAVENOUS
  Filled 2016-07-07 (×3): qty 2

## 2016-07-07 MED ORDER — SODIUM CHLORIDE 0.9 % IV SOLN
INTRAVENOUS | Status: DC
Start: 2016-07-07 — End: 2016-07-09
  Administered 2016-07-07 – 2016-07-08 (×2): via INTRAVENOUS

## 2016-07-07 MED ORDER — MONTELUKAST SODIUM 10 MG PO TABS
10.0000 mg | ORAL_TABLET | Freq: Every day | ORAL | Status: DC
  Administered 2016-07-07 – 2016-07-10 (×4): 10 mg via ORAL
  Filled 2016-07-07 (×4): qty 1

## 2016-07-07 MED ORDER — GABAPENTIN 300 MG PO CAPS
300.0000 mg | ORAL_CAPSULE | Freq: Three times a day (TID) | ORAL | Status: DC
  Administered 2016-07-07 – 2016-07-11 (×12): 300 mg via ORAL
  Filled 2016-07-07 (×12): qty 1

## 2016-07-07 MED ORDER — VANCOMYCIN HCL IN DEXTROSE 1-5 GM/200ML-% IV SOLN
1000.0000 mg | Freq: Once | INTRAVENOUS | Status: AC
  Administered 2016-07-07: 1000 mg via INTRAVENOUS
  Filled 2016-07-07: qty 200

## 2016-07-07 MED ORDER — HYDROCHLOROTHIAZIDE 12.5 MG PO CAPS
12.5000 mg | ORAL_CAPSULE | Freq: Every day | ORAL | Status: DC
  Administered 2016-07-07 – 2016-07-08 (×2): 12.5 mg via ORAL
  Filled 2016-07-07 (×2): qty 1

## 2016-07-07 MED ORDER — ESCITALOPRAM OXALATE 10 MG PO TABS
10.0000 mg | ORAL_TABLET | Freq: Every day | ORAL | Status: DC
  Administered 2016-07-07 – 2016-07-11 (×5): 10 mg via ORAL
  Filled 2016-07-07 (×5): qty 1

## 2016-07-07 MED ORDER — IPRATROPIUM-ALBUTEROL 0.5-2.5 (3) MG/3ML IN SOLN
3.0000 mL | Freq: Two times a day (BID) | RESPIRATORY_TRACT | Status: DC
  Administered 2016-07-08 – 2016-07-10 (×4): 3 mL via RESPIRATORY_TRACT
  Filled 2016-07-07 (×7): qty 3

## 2016-07-07 MED ORDER — POLYVINYL ALCOHOL 1.4 % OP SOLN
1.0000 [drp] | OPHTHALMIC | Status: DC | PRN
  Filled 2016-07-07 (×2): qty 15

## 2016-07-07 MED ORDER — MENTHOL 3 MG MT LOZG: 1.0000 | LOZENGE | OROMUCOSAL | Status: DC | PRN

## 2016-07-07 MED ORDER — LIP MEDEX EX OINT
TOPICAL_OINTMENT | CUTANEOUS | Status: AC
  Administered 2016-07-07: 14:00:00
  Filled 2016-07-07: qty 7

## 2016-07-07 MED ORDER — FLUTICASONE PROPIONATE 50 MCG/ACT NA SUSP
1.0000 | Freq: Two times a day (BID) | NASAL | Status: DC
  Administered 2016-07-08 – 2016-07-11 (×7): 1 via NASAL
  Filled 2016-07-07: qty 16

## 2016-07-07 MED ORDER — DOCUSATE SODIUM 100 MG PO CAPS
100.0000 mg | ORAL_CAPSULE | Freq: Every day | ORAL | Status: DC
  Administered 2016-07-09 – 2016-07-11 (×3): 100 mg via ORAL
  Filled 2016-07-07 (×4): qty 1

## 2016-07-07 MED ORDER — HYDROCODONE-ACETAMINOPHEN 5-325 MG PO TABS
1.0000 | ORAL_TABLET | Freq: Four times a day (QID) | ORAL | Status: DC | PRN
  Administered 2016-07-07: 1 via ORAL
  Filled 2016-07-07: qty 1

## 2016-07-07 MED ORDER — BENZONATATE 100 MG PO CAPS: 200.0000 mg | ORAL_CAPSULE | Freq: Three times a day (TID) | ORAL | Status: DC | PRN

## 2016-07-07 MED ORDER — IPRATROPIUM-ALBUTEROL 0.5-2.5 (3) MG/3ML IN SOLN
3.0000 mL | Freq: Four times a day (QID) | RESPIRATORY_TRACT | Status: DC
  Administered 2016-07-07: 3 mL via RESPIRATORY_TRACT
  Filled 2016-07-07: qty 3

## 2016-07-07 MED ORDER — SODIUM CHLORIDE 0.9 % IV BOLUS (SEPSIS)
2000.0000 mL | Freq: Once | INTRAVENOUS | Status: AC
  Administered 2016-07-07: 2000 mL via INTRAVENOUS

## 2016-07-07 MED ORDER — GUAIFENESIN ER 600 MG PO TB12
1200.0000 mg | ORAL_TABLET | Freq: Two times a day (BID) | ORAL | Status: DC
  Administered 2016-07-07 – 2016-07-11 (×8): 1200 mg via ORAL
  Filled 2016-07-07 (×8): qty 2

## 2016-07-07 MED ORDER — PROPYLENE GLYCOL-GLYCERIN 0.6-0.6 % OP SOLN: 1.0000 [drp] | Freq: Two times a day (BID) | OPHTHALMIC | Status: DC

## 2016-07-07 MED ORDER — VANCOMYCIN HCL 10 G IV SOLR
1250.0000 mg | INTRAVENOUS | Status: DC
  Filled 2016-07-07: qty 1250

## 2016-07-07 MED ORDER — POLYETHYLENE GLYCOL 3350 17 G PO PACK: 17.0000 g | PACK | Freq: Every day | ORAL | Status: DC | PRN

## 2016-07-07 MED ORDER — CALCIUM CARBONATE-VITAMIN D 500-200 MG-UNIT PO TABS
1.0000 | ORAL_TABLET | Freq: Every day | ORAL | Status: DC
  Administered 2016-07-08 – 2016-07-11 (×4): 1 via ORAL
  Filled 2016-07-07 (×4): qty 1

## 2016-07-07 MED ORDER — DEXTROSE 5 % IV SOLN
1.0000 g | Freq: Three times a day (TID) | INTRAVENOUS | Status: DC
  Administered 2016-07-07 – 2016-07-09 (×6): 1 g via INTRAVENOUS
  Filled 2016-07-07 (×7): qty 1

## 2016-07-07 MED ORDER — SODIUM CHLORIDE 0.9 % IV BOLUS (SEPSIS)
1000.0000 mL | Freq: Once | INTRAVENOUS | Status: AC
  Administered 2016-07-07: 1000 mL via INTRAVENOUS

## 2016-07-07 MED ORDER — VITAMIN D 1000 UNITS PO TABS
2000.0000 [IU] | ORAL_TABLET | Freq: Every day | ORAL | Status: DC
  Administered 2016-07-07 – 2016-07-11 (×5): 2000 [IU] via ORAL
  Filled 2016-07-07 (×5): qty 2

## 2016-07-07 MED ORDER — CALCIUM CARB-CHOLECALCIFEROL 600-800 MG-UNIT PO TABS: 1.0000 | ORAL_TABLET | Freq: Every day | ORAL | Status: DC

## 2016-07-07 NOTE — ED Provider Notes (Signed)
[place] DEPT Provider Note   CSN: [id number] Arrival date & time: 07/07/16  0750     History   Chief Complaint Chief Complaint  Patient presents with  . Fever    HPI BRINDA FOCHT is a 83 y.o. female.  Patient complains of cough and fever. Patient was treated with Levaquin for the last 10 days for possible pneumonia.   The history is provided by the patient. No language interpreter was used.  Fever   This is a new problem. The current episode started 12 to 24 hours ago. The problem occurs constantly. The problem has not changed since onset.The maximum temperature noted was 102 to 102.9 F. Associated symptoms include cough. Pertinent negatives include no chest pain, no diarrhea, no vomiting, no congestion and no headaches. Treatments tried: Levaquin. The treatment provided mild relief.  Cough  This is a recurrent problem. The current episode started more than 2 days ago. The problem occurs constantly. The problem has not changed since onset.The cough is non-productive. The maximum temperature recorded prior to her arrival was 102 to 102.9 F. The fever has been present for less than 1 day. Pertinent negatives include no chest pain and no headaches. The treatment provided mild relief. She is not a smoker. Her past medical history is significant for pneumonia.    Past Medical History:  Diagnosis Date  . Arthritis   . Asthma   . Breast cancer ([place]) 2006   right w/lumpectomy  . COPD (chronic obstructive pulmonary disease) ([place])   . Depression   . Diabetes ([place]) 2007  . Diverticulosis of colon   . Endometrial hyperplasia 01/1996   fibroids, adenomyosis  . Frequent UTI   . History of colon polyps   . Hypertension   . Insomnia   . Osteopenia 12/08   hip  . Spinal stenosis 2001  . Vitamin D deficiency     Patient Active Problem List   Diagnosis Date Noted  . Sepsis ([place]) 05/08/2016  . Community acquired pneumonia 05/08/2016  . Failed total hip arthroplasty ([place])  07/10/2015  . Breast cancer, right breast ([place]) 07/18/2014  . CONCUSSION WITH LOC OF UNSPECIFIED DURATION 07/19/2009  . OTH SYMPTOMS INVOLVING RESPIRATORY SYSTEM&CHEST 11/12/2008  . UNSPECIFIED DISORDER OF ANKLE AND FOOT JOINT 05/11/2008  . CERUMEN IMPACTION, BILATERAL 05/04/2008  . DYSMETABOLIC SYNDROME X 10/62/6948  . UNS ADVRS EFF UNS RX MEDICINAL&BIOLOGICAL SBSTNC 08/12/2007  . Other and unspecified hyperlipidemia 04/01/2007  . INSOMNIA, PERSISTENT 09/09/2006  . Depression 09/09/2006  . Essential hypertension 09/09/2006  . Asthma 09/09/2006  . DIVERTICULOSIS, COLON 09/09/2006  . OSTEOARTHRITIS 09/09/2006  . STENOSIS, LUMBAR SPINE 09/09/2006  . COLONIC POLYPS, HX OF 09/09/2006    Past Surgical History:  Procedure Laterality Date  . BILATERAL SALPINGOOPHORECTOMY  12/97  . BREAST LUMPECTOMY Right 03/2003  . CARPAL TUNNEL RELEASE    . CERVICAL FUSION    . CERVICAL LAMINECTOMY  1975   x4  . CHOLECYSTECTOMY  11/00   lap chole  . HYSTEROSCOPY  10/97   D&C, postmenopausal bleeding  . LUMBAR LAMINECTOMY  10/06   with spinal stenosis  . manipulation of hip for dislocation    . ROTATOR CUFF REPAIR Right 11/96  . TONSILLECTOMY AND ADENOIDECTOMY    . TOTAL ABDOMINAL HYSTERECTOMY  12/97   endo hyperplasia, fibroids, adenomyosis  . TOTAL HIP ARTHROPLASTY Right 8/00  . TOTAL HIP REVISION Right 07/10/2015   Procedure: RIGHT ACETABULAR VS TOTAL HIP REVISION;  Surgeon: Gaynelle Arabian, MD;  Location: [facility] ORS;  Service: Orthopedics;  Laterality: Right;  . TOTAL KNEE ARTHROPLASTY Left 06/2003  . TOTAL KNEE ARTHROPLASTY Right 10/06  . TUBAL LIGATION Bilateral 1968    OB History    Gravida Para Term Preterm AB Living   3 3 3          SAB TAB Ectopic Multiple Live Births                   Home Medications    Prior to Admission medications   Medication Sig Start Date End Date Taking? Authorizing Provider  acetaminophen (TYLENOL) 325 MG tablet Take 2 tablets (650 mg total) by mouth  every 6 (six) hours as needed for mild pain (or Fever >/= 101). 07/12/15  Yes Perkins, Alexzandrew L, PA-C  acetaminophen (TYLENOL) 500 MG tablet Take 1,000 mg by mouth every 8 (eight) hours as needed for mild pain.   Yes [provider]  albuterol (PROAIR HFA) 108 (90 BASE) MCG/ACT inhaler Inhale 2 puffs into the lungs every 4 (four) hours as needed for shortness of breath.    Yes [provider]  albuterol (PROVENTIL) (2.5 MG/3ML) 0.083% nebulizer solution Take 2.5 mg by nebulization every 4 (four) hours as needed for wheezing or shortness of breath.   Yes [provider]  aspirin EC 325 MG tablet Take 325 mg by mouth daily.    Yes [provider]  buPROPion (WELLBUTRIN XL) 300 MG 24 hr tablet Take 300 mg by mouth every morning.    Yes [provider]  Calcium Carb-Cholecalciferol (CALCIUM+D3) 600-800 MG-UNIT TABS Take 1 tablet by mouth daily.   Yes [provider]  Cholecalciferol (VITAMIN D) 2000 units tablet Take 2,000 Units by mouth daily.   Yes [provider]  clonazePAM (KLONOPIN) 1 MG tablet Take 1 tablet (1 mg total) by mouth at bedtime as needed (insomnia). 05/10/16  Yes Mikhail, Velta Addison, DO  docusate sodium (COLACE) 100 MG capsule Take 100 mg by mouth daily.   Yes [provider]  escitalopram (LEXAPRO) 10 MG tablet Take 10 mg by mouth daily.  04/20/11  Yes [provider]  fluticasone (FLONASE) 50 MCG/ACT nasal spray Place 1 spray into both nostrils 2 (two) times daily.   Yes [provider]  gabapentin (NEURONTIN) 300 MG capsule Take 1 capsule (300 mg total) by mouth 3 (three) times daily. 11/14/15  Yes Magrinat, Virgie Dad, MD  guaiFENesin (MUCINEX) 600 MG 12 hr tablet Take 600 mg by mouth 2 (two) times daily as needed for cough or to loosen phlegm.   Yes [provider]  guaiFENesin-dextromethorphan (ROBITUSSIN DM) 100-10 MG/5ML syrup Take 15 mLs by mouth every 4 (four) hours as needed for  cough.   Yes [provider]  Homeopathic Products (ARNICARE ARNICA) CREA Apply 1 application topically as needed (muscle pain/stiffness).   Yes [provider]  hydrochlorothiazide (MICROZIDE) 12.5 MG capsule Take 12.5 mg by mouth daily.   Yes [provider]  HYDROcodone-acetaminophen (NORCO/VICODIN) 5-325 MG tablet Take 1 tablet by mouth every 6 (six) hours as needed for moderate pain. 05/10/16  Yes Mikhail, Velta Addison, DO  Ibuprofen (ADVIL) 200 MG CAPS Take 400 mg by mouth every 12 (twelve) hours as needed (pain).   Yes [provider]  menthol-cetylpyridinium (CEPACOL) 3 MG lozenge Take 1 lozenge by mouth every 4 (four) hours as needed for sore throat.   Yes [provider]  methocarbamol (ROBAXIN) 500 MG tablet Take 1 tablet (500 mg total) by mouth every 6 (six)  hours as needed for muscle spasms. 07/12/15  Yes Perkins, Alexzandrew L, PA-C  mometasone-formoterol (DULERA) 200-5 MCG/ACT AERO Inhale 2 puffs into the lungs 2 (two) times daily.   Yes [provider]  montelukast (SINGULAIR) 10 MG tablet Take 10 mg by mouth at bedtime.     Yes [provider]  ondansetron (ZOFRAN) 4 MG tablet Take 4 mg by mouth every 8 (eight) hours as needed for nausea or vomiting.   Yes [provider]  polyethylene glycol (MIRALAX / GLYCOLAX) packet Take 17 g by mouth daily as needed for mild constipation. 07/12/15  Yes Perkins, Alexzandrew L, PA-C  Propylene Glycol-Glycerin (SOOTHE) 0.6-0.6 % SOLN Apply 1 drop to eye 2 (two) times daily. 11/28/15  Yes Domenic Moras, PA-C  sodium phosphate (FLEET) 7-19 GM/118ML ENEM Place 133 mLs (1 enema total) rectally once as needed for severe constipation. 07/12/15  Yes Perkins, Alexzandrew L, PA-C  azithromycin (ZITHROMAX) 500 MG tablet Take 1 tablet (500 mg total) by mouth daily. Patient not taking: Reported on 07/07/2016 05/10/16   Cristal Ford, DO  cefUROXime (CEFTIN) 250 MG tablet Take 1 tablet (250 mg total)  by mouth 2 (two) times daily with a meal. Patient not taking: Reported on 07/07/2016 05/10/16   Cristal Ford, DO  levofloxacin (LEVAQUIN) 500 MG tablet Take 500 mg by mouth daily.    [provider]  mupirocin ointment (BACTROBAN) 2 % Place 1 application into the nose 2 (two) times daily. Use through 05/14/2016 Patient not taking: Reported on 07/07/2016 05/10/16   Cristal Ford, DO    Family History Family History  Problem Relation Age of Onset  . Diabetes Father   . Colon cancer Father 60  . Heart failure Father   . Arthritis Mother   . Thyroid disease Mother   . Osteoporosis Mother   . Diabetes Brother   . Diabetes Brother   . Osteoporosis Maternal Aunt   . Osteoporosis Maternal Grandmother     Social History Social History  Substance Use Topics  . Smoking status: Former Smoker    Quit date: 04/24/1990  . Smokeless tobacco: Never Used  . Alcohol use 2.4 oz/week    4 Glasses of wine per week     Comment: occ     Allergies   Oxycodone hcl [oxycodone hcl]; Morphine sulfate; and Sulfamethoxazole   Review of Systems Review of Systems  Constitutional: Positive for fever. Negative for appetite change and fatigue.  HENT: Negative for congestion, ear discharge and sinus pressure.   Eyes: Negative for discharge.  Respiratory: Positive for cough.   Cardiovascular: Negative for chest pain.  Gastrointestinal: Negative for abdominal pain, diarrhea and vomiting.  Genitourinary: Negative for frequency and hematuria.  Musculoskeletal: Negative for back pain.  Skin: Negative for rash.  Neurological: Negative for seizures and headaches.  Psychiatric/Behavioral: Negative for hallucinations.     Physical Exam Updated Vital Signs BP 126/68 (BP Location: Left Arm)   Pulse 79   Temp 99.9 F (37.7 C) (Oral)   Resp 20   SpO2 97%   Physical Exam  Constitutional: She is oriented to person, place, and time. She appears well-developed.  HENT:  Head: Normocephalic.    Eyes: Conjunctivae and EOM are normal. No scleral icterus.  Neck: Neck supple. No thyromegaly present.  Cardiovascular: Normal rate and regular rhythm.  Exam reveals no gallop and no friction rub.   No murmur heard. Pulmonary/Chest: No stridor. She has no wheezes. She has no rales. She exhibits no tenderness.  Abdominal:  She exhibits no distension. There is no tenderness. There is no rebound.  Musculoskeletal: Normal range of motion. She exhibits no edema.  Lymphadenopathy:    She has no cervical adenopathy.  Neurological: She is oriented to person, place, and time. She exhibits normal muscle tone. Coordination normal.  Skin: No rash noted. No erythema.  Psychiatric: She has a normal mood and affect. Her behavior is normal.     ED Treatments / Results  Labs (all labs ordered are listed, but only abnormal results are displayed) Labs Reviewed  COMPREHENSIVE METABOLIC PANEL - Abnormal; Notable for the following:       Result Value   Sodium 133 (*)    Potassium 3.3 (*)    Chloride 99 (*)    Glucose, Bld 112 (*)    Calcium 8.8 (*)    All other components within normal limits  CBC WITH DIFFERENTIAL/PLATELET - Abnormal; Notable for the following:    WBC 16.9 (*)    Neutro Abs 14.2 (*)    Monocytes Absolute 1.8 (*)    All other components within normal limits  BRAIN NATRIURETIC PEPTIDE - Abnormal; Notable for the following:    B Natriuretic Peptide 103.1 (*)    All other components within normal limits  CULTURE, BLOOD (ROUTINE X 2)  CULTURE, BLOOD (ROUTINE X 2)  PROTIME-INR  URINALYSIS, ROUTINE W REFLEX MICROSCOPIC  I-STAT CG4 LACTIC ACID, ED  Randolm Idol, ED    EKG  EKG Interpretation None       Radiology Dg Chest 2 View  Result Date: 07/07/2016 CLINICAL DATA:  History recent pneumonia. Suspected fever. Not feeling well. EXAM: CHEST  2 VIEW COMPARISON:  05/08/2016 FINDINGS: The heart size and mediastinal contours are within normal limits. Both lungs are clear. The  visualized skeletal structures are unremarkable. IMPRESSION: No active cardiopulmonary disease. Electronically Signed   By: Kathreen Devoid   On: 07/07/2016 08:37    Procedures Procedures (including critical care time)  Medications Ordered in ED Medications  vancomycin (VANCOCIN) IVPB 1000 mg/200 mL premix (1,000 mg Intravenous New Bag/Given 07/07/16 0936)  sodium chloride 0.9 % bolus 1,000 mL (not administered)  acetaminophen (TYLENOL) tablet 650 mg (650 mg Oral Given 07/07/16 0849)  sodium chloride 0.9 % bolus 2,000 mL (2,000 mLs Intravenous New Bag/Given 07/07/16 0850)  piperacillin-tazobactam (ZOSYN) IVPB 3.375 g (0 g Intravenous Stopped 07/07/16 0927)     Initial Impression / Assessment and Plan / ED Course  I have reviewed the triage vital signs and the nursing notes.  Pertinent labs & imaging results that were available during my care of the patient were reviewed by me and considered in my medical decision making (see chart for details).     Patient with febrile illness. Possible pneumonia not seen on chest x-ray. She is treated with sepsis protocol and will be admitted by medicine  Final Clinical Impressions(s) / ED Diagnoses   Final diagnoses:  None    New Prescriptions New Prescriptions   No medications on file     Milton Ferguson, MD 07/07/16 1004

## 2016-07-07 NOTE — H&P (Addendum)
HISTORY AND PHYSICAL       PATIENT DETAILS Name: [name] Age: 83 y.o. Sex: female Date of Birth: 04/01/1930 Admit Date: 07/07/2016 OVZ:CHYIF, Annie Main, MD   Patient coming from: Assisted living facility   CHIEF COMPLAINT:  Fever, cough, wheezing, generalized weakness for the past 7-10 days  HPI: [name] is a 83 y.o. female with medical history significant of bronchial asthma, hypertension, prior history of breast cancer, chronic right leg ulcer-who presented to the ED for evaluation of the above-noted complaints. Per patient, approximately 10 days back, she started having low-grade fever, cough and was just not feeling right. Apparently she was seen by her primary care practitioner, a chest x-ray was done (was negative for pneumonia) and was prescribed levofloxacin-however her symptoms did not improve. She claims that the cough is usually productive with clear phlegm, there is no history of hemoptysis. She claims that she feels weak and just does not feel like her usual self-when asked if she has myalgias and arthralgias-she denies this. Because of continued fever, cough, and weakness, she was transferred to the emergency room today for further evaluation and treatment.  Apparently her husband has had similar complaints/issues as well-and apparently has been told he has pneumonia but this was not seen on x-ray. Her husband is with her in the same room at the assisted living facility  Per patient-she does not have shortness of breath at rest, but with minimal activity she does get short of breath. She does claim that she can hear herself wheezing, and she's got some "rattling" in her chest.  She denies any chest pain, abdominal pain, nausea, vomiting or diarrhea. She also denies any headache or neck pain.  Note-she does acknowledge loose stools-but she just has one bowel movement daily   ED Course:  In the emergency room she was found to have a fever of 102.47F,  she was found to have significant leukocytosis of 16,000-a chest x-ray did not show pneumonia-she was given vancomycin and Zosyn, the hospitalist service was asked to admit this patient for further evaluation and treatment.  Note: Lives at: ALF Mobility: Wheelchair  Chronic Indwelling Foley:no   REVIEW OF SYSTEMS:  Constitutional:   No  weight loss, night sweats  HEENT:    No headaches, Dysphagia,Tooth/dental problems,Sore throat  Cardio-vascular: No chest pain,Orthopnea, PND,lower extremity edema, anasarca, palpitations  GI:  No heartburn, indigestion, abdominal pain, nausea, vomiting, diarrhea, melena or hematochezia  Resp: No hemoptysis,plueritic chest pain.   Skin:  No rash or lesions.  GU:  No dysuria, change in color of urine, no urgency or frequency.  No flank pain.  Musculoskeletal: No joint pain or swelling.  No decreased range of motion.   Endocrine: No heat intolerance, no cold intolerance, no polyuria  Psych: No memory loss.   ALLERGIES:   Allergies  Allergen Reactions  . Oxycodone Hcl [Oxycodone Hcl] Rash  . Morphine Sulfate Other (See Comments)    REACTION: very anxious  . Sulfamethoxazole Other (See Comments)    REACTION: unspecified    PAST MEDICAL HISTORY: Past Medical History:  Diagnosis Date  . Arthritis   . Asthma   . Breast cancer ([place]) 2006   right w/lumpectomy  . COPD (chronic obstructive pulmonary disease) ([place])   . Depression   . Diabetes ([place]) 2007  . Diverticulosis of colon   . Endometrial hyperplasia 01/1996   fibroids, adenomyosis  . Frequent UTI   . History of colon polyps   .  Hypertension   . Insomnia   . Osteopenia 12/08   hip  . Spinal stenosis 2001  . Vitamin D deficiency     PAST SURGICAL HISTORY: Past Surgical History:  Procedure Laterality Date  . BILATERAL SALPINGOOPHORECTOMY  12/97  . BREAST LUMPECTOMY Right 03/2003  . CARPAL TUNNEL RELEASE    . CERVICAL FUSION    . CERVICAL LAMINECTOMY  1975   x4    . CHOLECYSTECTOMY  11/00   lap chole  . HYSTEROSCOPY  10/97   D&C, postmenopausal bleeding  . LUMBAR LAMINECTOMY  10/06   with spinal stenosis  . manipulation of hip for dislocation    . ROTATOR CUFF REPAIR Right 11/96  . TONSILLECTOMY AND ADENOIDECTOMY    . TOTAL ABDOMINAL HYSTERECTOMY  12/97   endo hyperplasia, fibroids, adenomyosis  . TOTAL HIP ARTHROPLASTY Right 8/00  . TOTAL HIP REVISION Right 07/10/2015   Procedure: RIGHT ACETABULAR VS TOTAL HIP REVISION;  Surgeon: Gaynelle Arabian, MD;  Location: [facility];  Service: Orthopedics;  Laterality: Right;  . TOTAL KNEE ARTHROPLASTY Left 06/2003  . TOTAL KNEE ARTHROPLASTY Right 10/06  . TUBAL LIGATION Bilateral 1968    MEDICATIONS AT HOME: Prior to Admission medications   Medication Sig Start Date End Date Taking? Authorizing Provider  acetaminophen (TYLENOL) 325 MG tablet Take 2 tablets (650 mg total) by mouth every 6 (six) hours as needed for mild pain (or Fever >/= 101). 07/12/15  Yes Perkins, Alexzandrew L, PA-C  acetaminophen (TYLENOL) 500 MG tablet Take 1,000 mg by mouth every 8 (eight) hours as needed for mild pain.   Yes [provider]  albuterol (PROAIR HFA) 108 (90 BASE) MCG/ACT inhaler Inhale 2 puffs into the lungs every 4 (four) hours as needed for shortness of breath.    Yes [provider]  albuterol (PROVENTIL) (2.5 MG/3ML) 0.083% nebulizer solution Take 2.5 mg by nebulization every 4 (four) hours as needed for wheezing or shortness of breath.   Yes [provider]  aspirin EC 325 MG tablet Take 325 mg by mouth daily.    Yes [provider]  buPROPion (WELLBUTRIN XL) 300 MG 24 hr tablet Take 300 mg by mouth every morning.    Yes [provider]  Calcium Carb-Cholecalciferol (CALCIUM+D3) 600-800 MG-UNIT TABS Take 1 tablet by mouth daily.   Yes [provider]  Cholecalciferol (VITAMIN D) 2000 units tablet Take 2,000 Units by mouth daily.   Yes [provider]   clonazePAM (KLONOPIN) 1 MG tablet Take 1 tablet (1 mg total) by mouth at bedtime as needed (insomnia). 05/10/16  Yes Mikhail, Velta Addison, DO  docusate sodium (COLACE) 100 MG capsule Take 100 mg by mouth daily.   Yes [provider]  escitalopram (LEXAPRO) 10 MG tablet Take 10 mg by mouth daily.  04/20/11  Yes [provider]  fluticasone (FLONASE) 50 MCG/ACT nasal spray Place 1 spray into both nostrils 2 (two) times daily.   Yes [provider]  gabapentin (NEURONTIN) 300 MG capsule Take 1 capsule (300 mg total) by mouth 3 (three) times daily. 11/14/15  Yes Magrinat, Virgie Dad, MD  guaiFENesin (MUCINEX) 600 MG 12 hr tablet Take 600 mg by mouth 2 (two) times daily as needed for cough or to loosen phlegm.   Yes [provider]  guaiFENesin-dextromethorphan (ROBITUSSIN DM) 100-10 MG/5ML syrup Take 15 mLs by mouth every 4 (four) hours as needed for cough.   Yes [provider]  Homeopathic Products (ARNICARE ARNICA) CREA Apply 1 application topically  as needed (muscle pain/stiffness).   Yes [provider]  hydrochlorothiazide (MICROZIDE) 12.5 MG capsule Take 12.5 mg by mouth daily.   Yes [provider]  HYDROcodone-acetaminophen (NORCO/VICODIN) 5-325 MG tablet Take 1 tablet by mouth every 6 (six) hours as needed for moderate pain. 05/10/16  Yes Mikhail, Velta Addison, DO  Ibuprofen (ADVIL) 200 MG CAPS Take 400 mg by mouth every 12 (twelve) hours as needed (pain).   Yes [provider]  menthol-cetylpyridinium (CEPACOL) 3 MG lozenge Take 1 lozenge by mouth every 4 (four) hours as needed for sore throat.   Yes [provider]  methocarbamol (ROBAXIN) 500 MG tablet Take 1 tablet (500 mg total) by mouth every 6 (six) hours as needed for muscle spasms. 07/12/15  Yes Perkins, Alexzandrew L, PA-C  mometasone-formoterol (DULERA) 200-5 MCG/ACT AERO Inhale 2 puffs into the lungs 2 (two) times daily.   Yes [provider]  montelukast  (SINGULAIR) 10 MG tablet Take 10 mg by mouth at bedtime.     Yes [provider]  ondansetron (ZOFRAN) 4 MG tablet Take 4 mg by mouth every 8 (eight) hours as needed for nausea or vomiting.   Yes [provider]  polyethylene glycol (MIRALAX / GLYCOLAX) packet Take 17 g by mouth daily as needed for mild constipation. 07/12/15  Yes Perkins, Alexzandrew L, PA-C  Propylene Glycol-Glycerin (SOOTHE) 0.6-0.6 % SOLN Apply 1 drop to eye 2 (two) times daily. 11/28/15  Yes Domenic Moras, PA-C  sodium phosphate (FLEET) 7-19 GM/118ML ENEM Place 133 mLs (1 enema total) rectally once as needed for severe constipation. 07/12/15  Yes Perkins, Alexzandrew L, PA-C  azithromycin (ZITHROMAX) 500 MG tablet Take 1 tablet (500 mg total) by mouth daily. Patient not taking: Reported on 07/07/2016 05/10/16   Cristal Ford, DO  cefUROXime (CEFTIN) 250 MG tablet Take 1 tablet (250 mg total) by mouth 2 (two) times daily with a meal. Patient not taking: Reported on 07/07/2016 05/10/16   Cristal Ford, DO  levofloxacin (LEVAQUIN) 500 MG tablet Take 500 mg by mouth daily.    [provider]  mupirocin ointment (BACTROBAN) 2 % Place 1 application into the nose 2 (two) times daily. Use through 05/14/2016 Patient not taking: Reported on 07/07/2016 05/10/16   Cristal Ford, DO    FAMILY HISTORY: Family History  Problem Relation Age of Onset  . Diabetes Father   . Colon cancer Father 67  . Heart failure Father   . Arthritis Mother   . Thyroid disease Mother   . Osteoporosis Mother   . Diabetes Brother   . Diabetes Brother   . Osteoporosis Maternal Aunt   . Osteoporosis Maternal Grandmother      SOCIAL HISTORY:  reports that she quit smoking about 26 years ago. She has never used smokeless tobacco. She reports that she drinks about 2.4 oz of alcohol per week . She reports that she does not use drugs.  PHYSICAL EXAM: Blood pressure 126/68, pulse 79, temperature 99.9 F (37.7 C), temperature  source Oral, resp. rate 20, SpO2 97 %.  General appearance :Awake, alert, not in any distress. Speech slow but clear, appears chronically sick appearing.  Eyes:, pupils equally reactive to light and accomodation,no scleral icterus. HEENT: Atraumatic and Normocephalic Neck: supple, no JVD. No cervical lymphadenopathy.  Resp:Good air entry bilaterally, diffuse wheezing heard all over CVS: S1 S2 regular GI: Bowel sounds present, Non tender and not distended with no gaurding, rigidity or rebound.No organomegaly Extremities: B/L Lower Ext shows no edema, both legs  are warm to touch. She does have a compressive wraps on the right lower extremity Neurology:  speech clear,Non focal, sensation is grossly intact. Psychiatric: Normal judgment and insight. Alert and oriented x 3.  Musculoskeletal:No digital cyanosis Skin:No Rash, warm and dry  LABS ON ADMISSION:  I have personally reviewed following labs and imaging studies  CBC:  Recent Labs Lab 07/07/16 0844  WBC 16.9*  NEUTROABS 14.2*  HGB 13.9  HCT 40.7  MCV 94.7  PLT 440    Basic Metabolic Panel:  Recent Labs Lab 07/07/16 0844  NA 133*  K 3.3*  CL 99*  CO2 25  GLUCOSE 112*  BUN 10  CREATININE 0.53  CALCIUM 8.8*    GFR: CrCl cannot be calculated (Unknown ideal weight.).  Liver Function Tests:  Recent Labs Lab 07/07/16 0844  AST 16  ALT 14  ALKPHOS 67  BILITOT 1.0  PROT 6.6  ALBUMIN 3.5   No results for input(s): LIPASE, AMYLASE in the last 168 hours. No results for input(s): AMMONIA in the last 168 hours.  Coagulation Profile:  Recent Labs Lab 07/07/16 0844  INR 1.18    Cardiac Enzymes: No results for input(s): CKTOTAL, CKMB, CKMBINDEX, TROPONINI in the last 168 hours.  BNP (last 3 results) No results for input(s): PROBNP in the last 8760 hours.  HbA1C: No results for input(s): HGBA1C in the last 72 hours.  CBG: No results for input(s): GLUCAP in the last 168 hours.  Lipid Profile: No  results for input(s): CHOL, HDL, LDLCALC, TRIG, CHOLHDL, LDLDIRECT in the last 72 hours.  Thyroid Function Tests: No results for input(s): TSH, T4TOTAL, FREET4, T3FREE, THYROIDAB in the last 72 hours.  Anemia Panel: No results for input(s): VITAMINB12, FOLATE, FERRITIN, TIBC, IRON, RETICCTPCT in the last 72 hours.  Urine analysis:    Component Value Date/Time   COLORURINE YELLOW 05/08/2016 1929   APPEARANCEUR CLEAR 05/08/2016 1929   LABSPEC <1.005 (L) 05/08/2016 1929   PHURINE 7.0 05/08/2016 1929   GLUCOSEU NEGATIVE 05/08/2016 1929   HGBUR TRACE (A) 05/08/2016 1929   BILIRUBINUR NEGATIVE 05/08/2016 1929   BILIRUBINUR n 09/25/2010 1525   KETONESUR NEGATIVE 05/08/2016 1929   PROTEINUR NEGATIVE 05/08/2016 1929   UROBILINOGEN 0.2 09/25/2010 1525   UROBILINOGEN 0.2 01/04/2007 0900   NITRITE NEGATIVE 05/08/2016 1929   LEUKOCYTESUR SMALL (A) 05/08/2016 1929    Sepsis Labs: Lactic Acid, Venous    Component Value Date/Time   LATICACIDVEN 0.71 07/07/2016 0848     Microbiology: No results found for this or any previous visit (from the past 240 hour(s)).    RADIOLOGIC STUDIES ON ADMISSION: Dg Chest 2 View  Result Date: 07/07/2016 CLINICAL DATA:  History recent pneumonia. Suspected fever. Not feeling well. EXAM: CHEST  2 VIEW COMPARISON:  05/08/2016 FINDINGS: The heart size and mediastinal contours are within normal limits. Both lungs are clear. The visualized skeletal structures are unremarkable. IMPRESSION: No active cardiopulmonary disease. Electronically Signed   By: Kathreen Devoid   On: 07/07/2016 08:37    I have personally reviewed images of chest xray   EKG:  Personally reviewed. NSR  ASSESSMENT AND PLAN: Systemic inflammatory response syndrome due to HCAP: Admit to the medical floor, start vancomycin and cefepime, obtain blood, sputum cultures. Although she does not have a pneumonia/consolidation seen on x-ray, her symptoms are highly suggestive of a respiratory tract  infection. Furthermore, husband has had similar symptoms-we'll also check a respiratory virus panel. Will provide supportive measures-Mucinex, and other antitussive treatment. We will follow her  clinical course closely.  Note-although she complains of loose stools-and these are just once a day-she does not have multiple frequent loose stools at this time. Plans are just to monitor without initiating workup   Addendum 11 AM: Informed by RN in the emergency room that patient is at multiple episodes of loose and profuse watery stools. She has had prior antibiotic exposure  last week, and also in March. Since her chest x-ray does not show pneumonia-and she continues to be febrile-we will check stool studies including C. difficile.  Asthma exacerbation: She is wheezing and does acknowledge short of breath with minimal activity. I suspect probable pneumonia has triggered reactive airway disease. We will start steroids, bronchodilators and other supportive measures. Monitor closely and follow clinical course  Hypokalemia: Replete and recheck  History of hypertension: Resume usual antihypertensives, follow BP trend and adjust accordingly  Depression: Continue usual antidepressants-her mood currently seems to be stable. She is awake, alert and answers all my questions appropriately.  History of right breast cancer: Followed closely by oncology in the outpatient setting. Currently just on observation, apparently has completed a course of antiestrogen therapy.  History of insomnia: Continue lorazepam as needed.  History of right leg ulceration: Has compression wraps in place-have ordered wound care evaluation.  Goals of care: Mostly wheelchair-bound elderly female-presenting with probable pneumonia and asthma exacerbation. She does not want to be resuscitated ("please do not code me"). Hopefully she will improve with supportive measures and empiric antimicrobial therapy, if she deteriorates, active care  will need to be involved.  Further plan will depend as patient's clinical course evolves and further radiologic and laboratory data become available. Patient will be monitored closely.  Above noted plan was discussed with patient face to face at bedside,she was in agreement.   CONSULTS: None  DVT Prophylaxis: Prophylactic Lovenox   Code Status: Full Code  Disposition Plan:  Discharge back  To ALF vs SNF possibly in 2-3 days, depending on clinical course  Admission status: Inpatient  going to  medical floor  The medical decision making on this patient was of high complexity and the patient is at high risk for clinical deterioration, therefore this is a level 3 visit.  Total time spent  55 minutes.Greater than 50% of this time was spent in counseling, explanation of diagnosis, planning of further management, and coordination of care.  Oren Binet [facility] Pager [phone]  If 7PM-7AM, please contact night-coverage [email or web address] Password [email or web address] 07/07/2016, 10:15 AM

## 2016-07-07 NOTE — ED Triage Notes (Signed)
Per EMS, pt is coming in from [facility] assisted leaving after staff called and stated pt was running a fever and didn't feel well. Pt was diagnosed with pneumonia approx. 11 days ago and was prescribed levaquin. Pt started feeling worse last night and spiked a fever. Per EMS, pt has a productive cough with brown sputum and has diminished sounds in the left lower lobe with no wheezing. Initial O2 sats 91% and pt was started on 4L. Pt is AO x4 per EMS.

## 2016-07-07 NOTE — Progress Notes (Addendum)
Pharmacy Antibiotic Note  MENUCHA DICESARE is a 83 y.o. female admitted on 07/07/2016 with possible HCAP. Pharmacy has been consulted for vancomycin dosing; cefepime per MD  Plan:  Vancomycin 1000 mg IV now, then 750 mg IV q12 hr; goal trough 15-20 mcg/mL  Measure vancomycin trough levels at steady state as indicated  Cefepime 1 g IV q8 hr per MD, dosing appropriate for indication  SCr ordered for 5/24  Recent MRSA PCR positive but given partial decontamination regimen at discharge. Consider rechecking, especially CXR not convincing for bacterial PNA.   Weight: 204 lb 12.9 oz (92.9 kg)  Temp (24hrs), Avg:100.7 F (38.2 C), Min:99.7 F (37.6 C), Max:102.4 F (39.1 C)   Recent Labs Lab 07/07/16 0844 07/07/16 0848  WBC 16.9*  --   CREATININE 0.53  --   LATICACIDVEN  --  0.71    Estimated Creatinine Clearance: 60.1 mL/min (by C-G formula based on SCr of 0.53 mg/dL).    Allergies  Allergen Reactions  . Oxycodone Hcl [Oxycodone Hcl] Rash  . Morphine Sulfate Other (See Comments)    REACTION: very anxious  . Sulfamethoxazole Other (See Comments)    REACTION: unspecified    Antimicrobials this admission: Vancomycin 5/22  >>  Cefepime 5/22 (MD) >>   Dose adjustments this admission: ---  Microbiology results: 5/22 BCx: sent 5/22 Resp PCR 5/22 GI PCR 5/22 Cdiff: neg/neg  5/22 Sputum: sent  3/24 MRSA PCR: positive - given bactroban as OP, but looks like order for CHG was cancelled  Thank you for allowing pharmacy to be a part of this patient's care.  Reuel Boom, PharmD, BCPS Pager: [phone] 07/07/2016, 1:07 PM

## 2016-07-07 NOTE — ED Notes (Signed)
Bed: WA21 Expected date:  Expected time:  Means of arrival:  Comments: EMS-PNA

## 2016-07-07 NOTE — ED Notes (Addendum)
Pt. Had a In and Out that was performed with no urine return. In and Out was performed by Georgeanne Nim (NT) and assisted by Gwyndolyn Saxon (NT). Nurse aware.

## 2016-07-07 NOTE — Care Management Note (Signed)
Case Management Note  Patient Details  Name: [name] MRN: [id number] Date of Birth: 09/14/1930  Subjective/Objective:                   83 y.o. female with medical history significant of bronchial asthma, hypertension, prior history of breast cancer, chronic right leg ulcer-who presented to the ED for evaluation of the above-noted complaints. Per patient, approximately 10 days back, she started having low-grade fever, cough and was just not feeling right. Apparently she was seen by her primary care practitioner, a chest x-ray was done (was negative for pneumonia) and was prescribed levofloxacin-however her symptoms did not improve. She claims that the cough is usually productive with clear phlegm, there is no history of hemoptysis. She claims that she feels weak and just does not feel like her usual self-when asked if she has myalgias and arthralgias-she denies this. Because of continued fever, cough, and weakness, she was transferred to the emergency room today for further evaluation and treatment.  Action/Plan: Will follow for cm needs  Expected Discharge Date:   (unknown)               Expected Discharge Plan:  Assisted Living / Rest Home  In-House Referral:  Clinical Social Work  Discharge planning Services  CM Consult  Post Acute Care Choice:    Choice offered to:     DME Arranged:    DME Agency:     HH Arranged:    [place] Agency:     Status of Service:  In process, will continue to follow  If discussed at Long Length of Stay Meetings, dates discussed:    Additional Comments:  Leeroy Cha, RN 07/07/2016, 3:09 PM

## 2016-07-07 NOTE — Consult Note (Addendum)
[facility] Nurse wound consult note Reason for Consult: Chronic nonhealing wound to right anterior lower leg, extends to medial lower leg.  Has HH for wound care at this time.  Wound type:Chronic nonhealing wounds to right leg.  Pressure Injury POA:N/A Measurement: 6 cm x 4 cm x (+) 0.1 cm hypergranulation  Wound WVT:VNRWC red and hypergranulated. No signs of infection present.  Drainage (amount, consistency, odor) Minimal serosanguinous Periwound:Intact chronic skin changes Dressing procedure/placement/frequency:Cleanse right leg with NS and pat gently dry.  Will implement silver hydrofiber dressing for absorption and to combat hypergranulation.  Change twice weekly. Tuesday and Friday.  Apply Aquacel Ag to nonintact wound. Cover with 4x4 gauze, kerlix and tape.  Will not follow at this time.  Please re-consult if needed.  Domenic Moras RN BSN [place] Pager [phone]

## 2016-07-07 NOTE — Clinical Social Work Note (Signed)
Clinical Social Work Assessment  Patient Details  Name: [name] MRN: [id number] Date of Birth: 24-Feb-1930  Date of referral:  07/07/16               Reason for consult:  Discharge Planning                Permission sought to share information with:  Chartered certified accountant granted to share information::  Yes, Verbal Permission Granted  Name::        Agency::     Relationship::     Contact Information:     Housing/Transportation Living arrangements for the past 2 months:  [place] [employer] Executive Officer ) Source of Information:  Adult Children Rod Holler) Patient Interpreter Needed:  None Criminal Activity/Legal Involvement Pertinent to Current Situation/Hospitalization:    Significant Relationships:  Adult Children, Spouse Lives with:  Spouse, Facility Resident Do you feel safe going back to the place where you live?  Yes Need for family participation in patient care:  Yes (Comment)  Care giving concerns:  Patient was unable to participate in assessment.    Social Worker assessment / plan:  CSW spoke with patients daughter, Rod Holler, regarding discharge plans once stable. Patient is a current resident at [facility] assisted living. Daughter states patient should return to [facility] once stable. Daughter did not voice any concerns. Patient was sleeping and was confused when attempting to wake- unable to participate in assessment.   Employment status:  Retired Nurse, adult PT Recommendations:  Not assessed at this time Information / Referral to community resources:     Patient/Family's Response to care:  Patients daughter appreciated CSW.   Patient/Family's Understanding of and Emotional Response to Diagnosis, Current Treatment, and Prognosis: Patient and family aware of current treatment and prognosis.   Emotional Assessment Appearance:  Appears stated age Attitude/Demeanor/Rapport:  Unable to Assess Affect  (typically observed):  Unable to Assess Orientation:    Alcohol / Substance use:    Psych involvement (Current and /or in the community):  No (Comment)  Discharge Needs  Concerns to be addressed:    Readmission within the last 30 days:  No Current discharge risk:  None Barriers to Discharge:  No Barriers Identified   Weston Anna, LCSW 07/07/2016, 3:01 PM

## 2016-07-08 DIAGNOSIS — J189 Pneumonia, unspecified organism: Principal | ICD-10-CM

## 2016-07-08 DIAGNOSIS — L97919 Non-pressure chronic ulcer of unspecified part of right lower leg with unspecified severity: Secondary | ICD-10-CM

## 2016-07-08 LAB — BASIC METABOLIC PANEL
ANION GAP: 6 (ref 5–15)
BUN: 13 mg/dL (ref 6–20)
CO2: 24 mmol/L (ref 22–32)
Calcium: 8.9 mg/dL (ref 8.9–10.3)
Chloride: 108 mmol/L (ref 101–111)
Creatinine, Ser: 0.48 mg/dL (ref 0.44–1.00)
GFR calc Af Amer: >60 mL/min (ref >60)
GFR calc non Af Amer: >60 mL/min (ref >60)
GLUCOSE: 156 mg/dL — AB (ref 65–99)
POTASSIUM: 3.6 mmol/L (ref 3.5–5.1)
Sodium: 138 mmol/L (ref 135–145)

## 2016-07-08 LAB — HIV ANTIBODY (ROUTINE TESTING W REFLEX): HIV SCREEN 4TH GENERATION: NONREACTIVE

## 2016-07-08 LAB — RESPIRATORY PANEL BY PCR
ADENOVIRUS-RVPPCR: NOT DETECTED
Bordetella pertussis: NOT DETECTED
CORONAVIRUS 229E-RVPPCR: NOT DETECTED
CORONAVIRUS NL63-RVPPCR: NOT DETECTED
CORONAVIRUS OC43-RVPPCR: NOT DETECTED
Chlamydophila pneumoniae: NOT DETECTED
Coronavirus HKU1: NOT DETECTED
Influenza A: NOT DETECTED
Influenza B: NOT DETECTED
METAPNEUMOVIRUS-RVPPCR: NOT DETECTED
MYCOPLASMA PNEUMONIAE-RVPPCR: NOT DETECTED
PARAINFLUENZA VIRUS 1-RVPPCR: NOT DETECTED
PARAINFLUENZA VIRUS 2-RVPPCR: NOT DETECTED
PARAINFLUENZA VIRUS 4-RVPPCR: NOT DETECTED
Parainfluenza Virus 3: NOT DETECTED
Respiratory Syncytial Virus: NOT DETECTED
Rhinovirus / Enterovirus: NOT DETECTED

## 2016-07-08 MED ORDER — HYDROCODONE-ACETAMINOPHEN 5-325 MG PO TABS
1.0000 | ORAL_TABLET | Freq: Four times a day (QID) | ORAL | Status: DC | PRN
  Administered 2016-07-08 – 2016-07-11 (×7): 2 via ORAL
  Filled 2016-07-08 (×7): qty 2

## 2016-07-08 MED ORDER — METHYLPREDNISOLONE SODIUM SUCC 40 MG IJ SOLR
40.0000 mg | Freq: Two times a day (BID) | INTRAMUSCULAR | Status: DC
  Administered 2016-07-08: 40 mg via INTRAVENOUS
  Filled 2016-07-08: qty 1

## 2016-07-08 NOTE — Progress Notes (Signed)
PROGRESS NOTE    [name]  HWE:[id number] DOB: [birth year] DOA: 07/07/2016 PCP: Reynold Bowen, MD   Brief Narrative: [name] is a 83 y.o. female with medical history significant of bronchial asthma, hypertension, prior history of breast cancer, chronic right leg ulcer-who presented to the ED for evaluation of the above-noted complaints. Per patient, approximately 10 days back, she started having low-grade fever, cough and was just not feeling right. Apparently she was seen by her primary care practitioner, a chest x-ray was done (was negative for pneumonia) and was prescribed levofloxacin-however her symptoms did not improve. She claims that the cough is usually productive with clear phlegm, there is no history of hemoptysis. She claims that she feels weak and just does not feel like her usual self-when asked if she has myalgias and arthralgias-she denies this. Because of continued fever, cough, and weakness, she was transferred to the emergency room today for further evaluation and treatment.  Apparently her husband has had similar complaints/issues as well-and apparently has been told he has pneumonia but this was not seen on x-ray. Her husband is with her in the same room at the assisted living facility   Assessment & Plan:   Active Problems:   INSOMNIA, PERSISTENT   Depression   Essential hypertension   Breast cancer, right breast (HCC)   SIRS (systemic inflammatory response syndrome) (HCC)   HCAP (healthcare-associated pneumonia)   Chronic ulcer of right leg (HCC)   SIRS, PNA; treating for Healthcare associated.  Continue with vancomycin and cefepime.  Repeat chest x ray in am. Chronic wound no sign of infection.  c diff negative.  Respiratory panel negative,  Follow blood cultures.   Asthma exacerbation;  On IV steroids, nebulizer.  taper steroids.   Hypokalemia; resolved.    HTN; hold HCTZ SBP in the 120 range.   Depression;  Continue with lexapro,  Wellbutrin/   History of right leg ulceration;  History of right breast cancer: DVT prophylaxis: Lovenox.  Code Status: DNR Family Communication:care discussed with patient.  Disposition Plan: probably ALF    Consultants:   none   Procedures:  none   Antimicrobials:  Vancomycin and cefepime 5-22   Subjective: She is worry about IV site, feels arm is cold, asking for IV site to be reviewed.  Report productive cough, brownish sputum.    Objective: Vitals:   07/07/16 1451 07/07/16 1618 07/07/16 2005 07/08/16 0515  BP:   127/68 127/84  Pulse:   84 70  Resp:   18 16  Temp:   97.7 F (36.5 C) 97.7 F (36.5 C)  TempSrc:   Oral Oral  SpO2: 100% 99% 96% 100%  Weight:        Intake/Output Summary (Last 24 hours) at 07/08/16 1319 Last data filed at 07/08/16 1004  Gross per 24 hour  Intake           1542.5 ml  Output                0 ml  Net           1542.5 ml   Filed Weights   07/07/16 1245  Weight: 92.9 kg (204 lb 12.9 oz)    Examination:  General exam: Appears calm and comfortable  Respiratory system:  Respiratory effort normal. No significant wheezing  Cardiovascular system: S1 & S2 heard, RRR. No JVD, murmurs, rubs, gallops or clicks. No pedal edema. Gastrointestinal system: Abdomen is nondistended, soft and nontender. No organomegaly or masses felt.  Normal bowel sounds heard. Central nervous system: Alert and oriented. No focal neurological deficits. Extremities: Symmetric 5 x 5 power. Skin: clean dressing right leg.     Data Reviewed: I have personally reviewed following labs and imaging studies  CBC:  Recent Labs Lab 07/07/16 0844 07/07/16 1310  WBC 16.9* 14.5*  NEUTROABS 14.2*  --   HGB 13.9 12.0  HCT 40.7 36.3  MCV 94.7 96.0  PLT 187 035   Basic Metabolic Panel:  Recent Labs Lab 07/07/16 0844 07/07/16 1310 07/08/16 0829  NA 133*  --  138  K 3.3*  --  3.6  CL 99*  --  108  CO2 25  --  24  GLUCOSE 112*  --  156*  BUN 10  --   13  CREATININE 0.53 0.56 0.48  CALCIUM 8.8*  --  8.9   GFR: Estimated Creatinine Clearance: 60.1 mL/min (by C-G formula based on SCr of 0.48 mg/dL). Liver Function Tests:  Recent Labs Lab 07/07/16 0844  AST 16  ALT 14  ALKPHOS 67  BILITOT 1.0  PROT 6.6  ALBUMIN 3.5   No results for input(s): LIPASE, AMYLASE in the last 168 hours. No results for input(s): AMMONIA in the last 168 hours. Coagulation Profile:  Recent Labs Lab 07/07/16 0844  INR 1.18   Cardiac Enzymes: No results for input(s): CKTOTAL, CKMB, CKMBINDEX, TROPONINI in the last 168 hours. BNP (last 3 results) No results for input(s): PROBNP in the last 8760 hours. HbA1C: No results for input(s): HGBA1C in the last 72 hours. CBG: No results for input(s): GLUCAP in the last 168 hours. Lipid Profile: No results for input(s): CHOL, HDL, LDLCALC, TRIG, CHOLHDL, LDLDIRECT in the last 72 hours. Thyroid Function Tests: No results for input(s): TSH, T4TOTAL, FREET4, T3FREE, THYROIDAB in the last 72 hours. Anemia Panel: No results for input(s): VITAMINB12, FOLATE, FERRITIN, TIBC, IRON, RETICCTPCT in the last 72 hours. Sepsis Labs:  Recent Labs Lab 07/07/16 0848  LATICACIDVEN 0.71    Recent Results (from the past 240 hour(s))  Culture, blood (Routine x 2)     Status: None (Preliminary result)   Collection Time: 07/07/16  8:44 AM  Result Value Ref Range Status   Specimen Description BLOOD RIGHT ANTECUBITAL  Final   Special Requests   Final    BOTTLES DRAWN AEROBIC AND ANAEROBIC Blood Culture adequate volume   Culture   Final    NO GROWTH 1 DAY Performed at [facility], [address] [place], [place] [address]    Report Status PENDING  Incomplete  Culture, blood (Routine x 2)     Status: None (Preliminary result)   Collection Time: 07/07/16  8:44 AM  Result Value Ref Range Status   Specimen Description BLOOD RIGHT HAND  Final   Special Requests   Final    BOTTLES DRAWN AEROBIC AND ANAEROBIC Blood  Culture adequate volume   Culture   Final    NO GROWTH 1 DAY Performed at [facility], [place] [address] [place], [place] [address]    Report Status PENDING  Incomplete  Gastrointestinal Panel by PCR , Stool     Status: None   Collection Time: 07/07/16 10:59 AM  Result Value Ref Range Status   Campylobacter species NOT DETECTED NOT DETECTED Final   Plesimonas shigelloides NOT DETECTED NOT DETECTED Final   Salmonella species NOT DETECTED NOT DETECTED Final   Yersinia enterocolitica NOT DETECTED NOT DETECTED Final   Vibrio species NOT DETECTED NOT DETECTED  Final   Vibrio cholerae NOT DETECTED NOT DETECTED Final   Enteroaggregative E coli (EAEC) NOT DETECTED NOT DETECTED Final   Enteropathogenic E coli (EPEC) NOT DETECTED NOT DETECTED Final   Enterotoxigenic E coli (ETEC) NOT DETECTED NOT DETECTED Final   Shiga like toxin producing E coli (STEC) NOT DETECTED NOT DETECTED Final   Shigella/Enteroinvasive E coli (EIEC) NOT DETECTED NOT DETECTED Final   Cryptosporidium NOT DETECTED NOT DETECTED Final   Cyclospora cayetanensis NOT DETECTED NOT DETECTED Final   Entamoeba histolytica NOT DETECTED NOT DETECTED Final   Giardia lamblia NOT DETECTED NOT DETECTED Final   Adenovirus F40/41 NOT DETECTED NOT DETECTED Final   Astrovirus NOT DETECTED NOT DETECTED Final   Norovirus GI/GII NOT DETECTED NOT DETECTED Final   Rotavirus A NOT DETECTED NOT DETECTED Final   Sapovirus (I, II, IV, and V) NOT DETECTED NOT DETECTED Final  C difficile quick scan w PCR reflex     Status: None   Collection Time: 07/07/16 10:59 AM  Result Value Ref Range Status   C Diff antigen NEGATIVE NEGATIVE Final   C Diff toxin NEGATIVE NEGATIVE Final   C Diff interpretation No C. difficile detected.  Final  Respiratory Panel by PCR     Status: None   Collection Time: 07/07/16  3:40 PM  Result Value Ref Range Status   Adenovirus NOT DETECTED NOT DETECTED Final   Coronavirus 229E NOT DETECTED NOT DETECTED Final    Coronavirus HKU1 NOT DETECTED NOT DETECTED Final   Coronavirus NL63 NOT DETECTED NOT DETECTED Final   Coronavirus OC43 NOT DETECTED NOT DETECTED Final   Metapneumovirus NOT DETECTED NOT DETECTED Final   Rhinovirus / Enterovirus NOT DETECTED NOT DETECTED Final   Influenza A NOT DETECTED NOT DETECTED Final   Influenza B NOT DETECTED NOT DETECTED Final   Parainfluenza Virus 1 NOT DETECTED NOT DETECTED Final   Parainfluenza Virus 2 NOT DETECTED NOT DETECTED Final   Parainfluenza Virus 3 NOT DETECTED NOT DETECTED Final   Parainfluenza Virus 4 NOT DETECTED NOT DETECTED Final   Respiratory Syncytial Virus NOT DETECTED NOT DETECTED Final   Bordetella pertussis NOT DETECTED NOT DETECTED Final   Chlamydophila pneumoniae NOT DETECTED NOT DETECTED Final   Mycoplasma pneumoniae NOT DETECTED NOT DETECTED Final    Comment: Performed at [facility], [place] [address], [place], [place] [address]  MRSA PCR Screening     Status: Abnormal   Collection Time: 07/07/16  4:06 PM  Result Value Ref Range Status   MRSA by PCR POSITIVE (A) NEGATIVE Final    Comment:        The GeneXpert MRSA Assay (FDA approved for NASAL specimens only), is one component of a comprehensive MRSA colonization surveillance program. It is not intended to diagnose MRSA infection nor to guide or monitor treatment for MRSA infections. RESULT CALLED TO, READ BACK BY AND VERIFIED WITH: A.HEAVNER,RN 2006 07/07/16 W.SHEA          Radiology Studies: Dg Chest 2 View  Result Date: 07/07/2016 CLINICAL DATA:  History recent pneumonia. Suspected fever. Not feeling well. EXAM: CHEST  2 VIEW COMPARISON:  05/08/2016 FINDINGS: The heart size and mediastinal contours are within normal limits. Both lungs are clear. The visualized skeletal structures are unremarkable. IMPRESSION: No active cardiopulmonary disease. Electronically Signed   By: Kathreen Devoid   On: 07/07/2016 08:37        Scheduled Meds: . aspirin EC  325 mg Oral Daily    . budesonide (PULMICORT) nebulizer  solution  0.25 mg Nebulization BID  . buPROPion  300 mg Oral Daily  . calcium-vitamin D  1 tablet Oral Q breakfast  . chlorhexidine  15 mL Mouth Rinse BID  . cholecalciferol  2,000 Units Oral Daily  . docusate sodium  100 mg Oral Daily  . enoxaparin (LOVENOX) injection  40 mg Subcutaneous Q24H  . escitalopram  10 mg Oral Daily  . fluticasone  1 spray Each Nare BID  . gabapentin  300 mg Oral TID  . guaiFENesin  1,200 mg Oral BID  . ipratropium-albuterol  3 mL Nebulization BID  . mouth rinse  15 mL Mouth Rinse q12n4p  . methylPREDNISolone (SOLU-MEDROL) injection  60 mg Intravenous Q8H  . montelukast  10 mg Oral QHS   Continuous Infusions: . sodium chloride 75 mL/hr at 07/08/16 0126  . ceFEPime (MAXIPIME) IV Stopped (07/08/16 0545)  . vancomycin Stopped (07/08/16 1051)     LOS: 1 day    Time spent: 35 minutes     Elmarie Shiley, MD [facility] Pager [phone]  If 7PM-7AM, please contact night-coverage [email or web address] Password [email or web address] 07/08/2016, 1:19 PM

## 2016-07-09 ENCOUNTER — Inpatient Hospital Stay (HOSPITAL_COMMUNITY): Payer: Medicare Other

## 2016-07-09 DIAGNOSIS — R6510 Systemic inflammatory response syndrome (SIRS) of non-infectious origin without acute organ dysfunction: Secondary | ICD-10-CM

## 2016-07-09 LAB — BASIC METABOLIC PANEL
Anion gap: 6 (ref 5–15)
BUN: 17 mg/dL (ref 6–20)
CHLORIDE: 107 mmol/L (ref 101–111)
CO2: 26 mmol/L (ref 22–32)
CREATININE: 0.58 mg/dL (ref 0.44–1.00)
Calcium: 8.9 mg/dL (ref 8.9–10.3)
GFR calc non Af Amer: >60 mL/min (ref >60)
Glucose, Bld: 114 mg/dL — ABNORMAL HIGH (ref 65–99)
Potassium: 3.8 mmol/L (ref 3.5–5.1)
Sodium: 139 mmol/L (ref 135–145)

## 2016-07-09 LAB — CBC
HEMATOCRIT: 34.6 % — AB (ref 36.0–46.0)
HEMOGLOBIN: 11.5 g/dL — AB (ref 12.0–15.0)
MCH: 31.8 pg (ref 26.0–34.0)
MCHC: 33.2 g/dL (ref 30.0–36.0)
MCV: 95.6 fL (ref 78.0–100.0)
Platelets: 189 10*3/uL (ref 150–400)
RBC: 3.62 MIL/uL — ABNORMAL LOW (ref 3.87–5.11)
RDW: 13.5 % (ref 11.5–15.5)
WBC: 13.1 10*3/uL — ABNORMAL HIGH (ref 4.0–10.5)

## 2016-07-09 MED ORDER — CHLORHEXIDINE GLUCONATE CLOTH 2 % EX PADS: 6.0000 | MEDICATED_PAD | Freq: Every day | CUTANEOUS | Status: DC

## 2016-07-09 MED ORDER — PREDNISONE 20 MG PO TABS
40.0000 mg | ORAL_TABLET | Freq: Every day | ORAL | Status: DC
  Administered 2016-07-09 – 2016-07-10 (×2): 40 mg via ORAL
  Filled 2016-07-09 (×2): qty 2

## 2016-07-09 MED ORDER — CHLORHEXIDINE GLUCONATE CLOTH 2 % EX PADS
6.0000 | MEDICATED_PAD | Freq: Every day | CUTANEOUS | Status: DC
  Administered 2016-07-09 – 2016-07-10 (×2): 6 via TOPICAL

## 2016-07-09 MED ORDER — MUPIROCIN 2 % EX OINT: 1.0000 "application " | TOPICAL_OINTMENT | Freq: Two times a day (BID) | CUTANEOUS | Status: DC

## 2016-07-09 MED ORDER — AMOXICILLIN-POT CLAVULANATE 875-125 MG PO TABS
1.0000 | ORAL_TABLET | Freq: Two times a day (BID) | ORAL | Status: DC
  Administered 2016-07-09 – 2016-07-11 (×5): 1 via ORAL
  Filled 2016-07-09 (×5): qty 1

## 2016-07-09 MED ORDER — MUPIROCIN 2 % EX OINT
1.0000 "application " | TOPICAL_OINTMENT | Freq: Two times a day (BID) | CUTANEOUS | Status: DC
  Administered 2016-07-09 – 2016-07-11 (×5): 1 via NASAL
  Filled 2016-07-09 (×2): qty 22

## 2016-07-09 NOTE — Progress Notes (Signed)
PROGRESS NOTE    [name]  JXB:[id number] DOB: 1930/05/02 DOA: 07/07/2016 PCP: Reynold Bowen, MD   Brief Narrative: [name] is a 83 y.o. female with medical history significant of bronchial asthma, hypertension, prior history of breast cancer, chronic right leg ulcer-who presented to the ED for evaluation of the above-noted complaints. Per patient, approximately 10 days back, she started having low-grade fever, cough and was just not feeling right. Apparently she was seen by her primary care practitioner, a chest x-ray was done (was negative for pneumonia) and was prescribed levofloxacin-however her symptoms did not improve. She claims that the cough is usually productive with clear phlegm, there is no history of hemoptysis. She claims that she feels weak and just does not feel like her usual self-when asked if she has myalgias and arthralgias-she denies this. Because of continued fever, cough, and weakness, she was transferred to the emergency room today for further evaluation and treatment.  Apparently her husband has had similar complaints/issues as well-and apparently has been told he has pneumonia but this was not seen on x-ray. Her husband is with her in the same room at the assisted living facility   Assessment & Plan:   Active Problems:   INSOMNIA, PERSISTENT   Depression   Essential hypertension   Breast cancer, right breast (HCC)   SIRS (systemic inflammatory response syndrome) (HCC)   HCAP (healthcare-associated pneumonia)   Chronic ulcer of right leg (HCC)   SIRS, Bronchitis PNA ?  Received vancomycin and cefepime for 2 days.   chest x ray, pleural effusion, no infiltrates.  Chronic wound no sign of infection.  c diff negative.  Respiratory panel negative,   blood cultures no growth to date.  Afebrile, WBC trending down. Change antibiotics to Augmentin. Observed overnight.    Asthma exacerbation;  Nebulizer.  taper steroids. Change to oral.    Hypokalemia; resolved.    HTN; hold HCTZ SBP in the 120 range.   Depression;  Continue with lexapro, Wellbutrin/   History of right leg ulceration;  History of right breast cancer: DVT prophylaxis: Lovenox.  Code Status: DNR Family Communication:care discussed with patient.  Disposition Plan: probably ALF in 24 hours.    Consultants:   none   Procedures:  none   Antimicrobials:  Vancomycin and cefepime 5-22   Subjective: Feeling better, cough improved. Would like to see PT    Objective: Vitals:   07/08/16 2122 07/09/16 0540 07/09/16 0950 07/09/16 1018  BP: 132/71 135/78 (!) 122/54   Pulse: 93 72 89   Resp: 20 20    Temp: 97.8 F (36.6 C) 98.2 F (36.8 C) 98.4 F (36.9 C)   TempSrc: Oral Oral Oral   SpO2: 99% 97% 99% 95%  Weight:        Intake/Output Summary (Last 24 hours) at 07/09/16 1347 Last data filed at 07/09/16 1300  Gross per 24 hour  Intake             1325 ml  Output                0 ml  Net             1325 ml   Filed Weights   07/07/16 1245  Weight: 92.9 kg (204 lb 12.9 oz)    Examination:  General exam: NAD Respiratory system:  CTA Cardiovascular system s1, s 2 RRR Gastrointestinal system: Abdomen is nondistended, soft and nontender. No organomegaly or masses felt. Normal bowel sounds heard. Central  nervous system: Alert and oriented. No focal neurological deficits. Extremities: Symmetric 5 x 5 power. Skin: clean dressing right leg.     Data Reviewed: I have personally reviewed following labs and imaging studies  CBC:  Recent Labs Lab 07/07/16 0844 07/07/16 1310 07/09/16 0535  WBC 16.9* 14.5* 13.1*  NEUTROABS 14.2*  --   --   HGB 13.9 12.0 11.5*  HCT 40.7 36.3 34.6*  MCV 94.7 96.0 95.6  PLT 187 153 540   Basic Metabolic Panel:  Recent Labs Lab 07/07/16 0844 07/07/16 1310 07/08/16 0829 07/09/16 0535  NA 133*  --  138 139  K 3.3*  --  3.6 3.8  CL 99*  --  108 107  CO2 25  --  24 26  GLUCOSE 112*  --   156* 114*  BUN 10  --  13 17  CREATININE 0.53 0.56 0.48 0.58  CALCIUM 8.8*  --  8.9 8.9   GFR: Estimated Creatinine Clearance: 60.1 mL/min (by C-G formula based on SCr of 0.58 mg/dL). Liver Function Tests:  Recent Labs Lab 07/07/16 0844  AST 16  ALT 14  ALKPHOS 67  BILITOT 1.0  PROT 6.6  ALBUMIN 3.5   No results for input(s): LIPASE, AMYLASE in the last 168 hours. No results for input(s): AMMONIA in the last 168 hours. Coagulation Profile:  Recent Labs Lab 07/07/16 0844  INR 1.18   Cardiac Enzymes: No results for input(s): CKTOTAL, CKMB, CKMBINDEX, TROPONINI in the last 168 hours. BNP (last 3 results) No results for input(s): PROBNP in the last 8760 hours. HbA1C: No results for input(s): HGBA1C in the last 72 hours. CBG: No results for input(s): GLUCAP in the last 168 hours. Lipid Profile: No results for input(s): CHOL, HDL, LDLCALC, TRIG, CHOLHDL, LDLDIRECT in the last 72 hours. Thyroid Function Tests: No results for input(s): TSH, T4TOTAL, FREET4, T3FREE, THYROIDAB in the last 72 hours. Anemia Panel: No results for input(s): VITAMINB12, FOLATE, FERRITIN, TIBC, IRON, RETICCTPCT in the last 72 hours. Sepsis Labs:  Recent Labs Lab 07/07/16 0848  LATICACIDVEN 0.71    Recent Results (from the past 240 hour(s))  Culture, blood (Routine x 2)     Status: None (Preliminary result)   Collection Time: 07/07/16  8:44 AM  Result Value Ref Range Status   Specimen Description BLOOD RIGHT ANTECUBITAL  Final   Special Requests   Final    BOTTLES DRAWN AEROBIC AND ANAEROBIC Blood Culture adequate volume   Culture   Final    NO GROWTH 1 DAY Performed at [facility], [address] [place], [place] [address]    Report Status PENDING  Incomplete  Culture, blood (Routine x 2)     Status: None (Preliminary result)   Collection Time: 07/07/16  8:44 AM  Result Value Ref Range Status   Specimen Description BLOOD RIGHT HAND  Final   Special Requests   Final     BOTTLES DRAWN AEROBIC AND ANAEROBIC Blood Culture adequate volume   Culture   Final    NO GROWTH 1 DAY Performed at [facility], [place] [address], [place], [place] [address]    Report Status PENDING  Incomplete  Gastrointestinal Panel by PCR , Stool     Status: None   Collection Time: 07/07/16 10:59 AM  Result Value Ref Range Status   Campylobacter species NOT DETECTED NOT DETECTED Final   Plesimonas shigelloides NOT DETECTED NOT DETECTED Final   Salmonella species NOT DETECTED NOT DETECTED Final  Yersinia enterocolitica NOT DETECTED NOT DETECTED Final   Vibrio species NOT DETECTED NOT DETECTED Final   Vibrio cholerae NOT DETECTED NOT DETECTED Final   Enteroaggregative E coli (EAEC) NOT DETECTED NOT DETECTED Final   Enteropathogenic E coli (EPEC) NOT DETECTED NOT DETECTED Final   Enterotoxigenic E coli (ETEC) NOT DETECTED NOT DETECTED Final   Shiga like toxin producing E coli (STEC) NOT DETECTED NOT DETECTED Final   Shigella/Enteroinvasive E coli (EIEC) NOT DETECTED NOT DETECTED Final   Cryptosporidium NOT DETECTED NOT DETECTED Final   Cyclospora cayetanensis NOT DETECTED NOT DETECTED Final   Entamoeba histolytica NOT DETECTED NOT DETECTED Final   Giardia lamblia NOT DETECTED NOT DETECTED Final   Adenovirus F40/41 NOT DETECTED NOT DETECTED Final   Astrovirus NOT DETECTED NOT DETECTED Final   Norovirus GI/GII NOT DETECTED NOT DETECTED Final   Rotavirus A NOT DETECTED NOT DETECTED Final   Sapovirus (I, II, IV, and V) NOT DETECTED NOT DETECTED Final  C difficile quick scan w PCR reflex     Status: None   Collection Time: 07/07/16 10:59 AM  Result Value Ref Range Status   C Diff antigen NEGATIVE NEGATIVE Final   C Diff toxin NEGATIVE NEGATIVE Final   C Diff interpretation No C. difficile detected.  Final  Respiratory Panel by PCR     Status: None   Collection Time: 07/07/16  3:40 PM  Result Value Ref Range Status   Adenovirus NOT DETECTED NOT DETECTED Final   Coronavirus 229E  NOT DETECTED NOT DETECTED Final   Coronavirus HKU1 NOT DETECTED NOT DETECTED Final   Coronavirus NL63 NOT DETECTED NOT DETECTED Final   Coronavirus OC43 NOT DETECTED NOT DETECTED Final   Metapneumovirus NOT DETECTED NOT DETECTED Final   Rhinovirus / Enterovirus NOT DETECTED NOT DETECTED Final   Influenza A NOT DETECTED NOT DETECTED Final   Influenza B NOT DETECTED NOT DETECTED Final   Parainfluenza Virus 1 NOT DETECTED NOT DETECTED Final   Parainfluenza Virus 2 NOT DETECTED NOT DETECTED Final   Parainfluenza Virus 3 NOT DETECTED NOT DETECTED Final   Parainfluenza Virus 4 NOT DETECTED NOT DETECTED Final   Respiratory Syncytial Virus NOT DETECTED NOT DETECTED Final   Bordetella pertussis NOT DETECTED NOT DETECTED Final   Chlamydophila pneumoniae NOT DETECTED NOT DETECTED Final   Mycoplasma pneumoniae NOT DETECTED NOT DETECTED Final    Comment: Performed at [facility], [place] [address], [place], [place] [address]  MRSA PCR Screening     Status: Abnormal   Collection Time: 07/07/16  4:06 PM  Result Value Ref Range Status   MRSA by PCR POSITIVE (A) NEGATIVE Final    Comment:        The GeneXpert MRSA Assay (FDA approved for NASAL specimens only), is one component of a comprehensive MRSA colonization surveillance program. It is not intended to diagnose MRSA infection nor to guide or monitor treatment for MRSA infections. RESULT CALLED TO, READ BACK BY AND VERIFIED WITH: A.HEAVNER,RN 2006 07/07/16 W.SHEA          Radiology Studies: Dg Chest 2 View  Result Date: 07/09/2016 CLINICAL DATA:  Fever and shortness of Breath EXAM: CHEST  2 VIEW COMPARISON:  07/07/2016 FINDINGS: Cardiac shadow is within normal limits. Small bilateral pleural effusions are noted. No focal infiltrate is noted. No acute bony abnormality is seen. Postsurgical changes in the cervical spine are noted. IMPRESSION: Small bilateral pleural effusions without focal infiltrate Electronically Signed   By: Linus Mako.D.  On: 07/09/2016 09:16        Scheduled Meds: . amoxicillin-clavulanate  1 tablet Oral Q12H  . aspirin EC  325 mg Oral Daily  . budesonide (PULMICORT) nebulizer solution  0.25 mg Nebulization BID  . buPROPion  300 mg Oral Daily  . calcium-vitamin D  1 tablet Oral Q breakfast  . chlorhexidine  15 mL Mouth Rinse BID  . Chlorhexidine Gluconate Cloth  6 each Topical Q0600  . cholecalciferol  2,000 Units Oral Daily  . docusate sodium  100 mg Oral Daily  . enoxaparin (LOVENOX) injection  40 mg Subcutaneous Q24H  . escitalopram  10 mg Oral Daily  . fluticasone  1 spray Each Nare BID  . gabapentin  300 mg Oral TID  . guaiFENesin  1,200 mg Oral BID  . ipratropium-albuterol  3 mL Nebulization BID  . mouth rinse  15 mL Mouth Rinse q12n4p  . montelukast  10 mg Oral QHS  . mupirocin ointment  1 application Nasal BID  . predniSONE  40 mg Oral Q breakfast   Continuous Infusions:    LOS: 2 days    Time spent: 35 minutes     Elmarie Shiley, MD [facility] Pager [phone]  If 7PM-7AM, please contact night-coverage [email or web address] Password [email or web address] 07/09/2016, 1:47 PM

## 2016-07-09 NOTE — Progress Notes (Signed)
Report from [place], Therapist, sports. Care assumed for pt at this time. Pt resting in bed. Assessment unchanged from previous RN. Pt denies pain/discomfort. No c/o at present. Will monitor.

## 2016-07-09 NOTE — Evaluation (Addendum)
Physical Therapy Evaluation Patient Details Name: [name] MRN: [id number] DOB: 1930/04/07 Today's Date: 07/09/2016   History of Present Illness  [name] is a 83 y.o. female with medical history significant of bronchial asthma, hypertension, breast cancer, chronic right leg ulcer-who presented to the ED 07/07/16 with progressive weakness, wheezing, fever. in hospital in 3/18 for pneumonia.  Clinical Impression  The patient is very anxious about  Transfers with RW. Patient is not clear as to the amount of assistance that she required PTA.  Patient  States that she will not go to a SNF. She will require assistance for all transfers.Pt admitted with above diagnosis. Pt currently with functional limitations due to the deficits listed below (see PT Problem List).  Pt will benefit from skilled PT to increase their independence and safety with mobility to allow discharge to the venue listed below.       Follow Up Recommendations SNF (pt tates that she will not go to SNF)    Equipment Recommendations  None recommended by PT    Recommendations for Other Services       Precautions / Restrictions Precautions Precautions: Fall Precaution Comments: chronic right  leg wound  incontinence     Mobility  Bed Mobility Overal bed mobility: Needs Assistance Bed Mobility: Supine to Sit;Sit to Supine     Supine to sit: Mod assist Sit to supine: Mod assist   General bed mobility comments: assist with the trunk to sit up and  assist with legs onto the bed.  Transfers Overall transfer level: Needs assistance Equipment used: Rolling walker (2 wheeled) Transfers: Sit to/from [facility] Sit to Stand: Mod assist;+2 physical assistance;+2 safety/equipment Stand pivot transfers: Mod assist;+2 physical assistance;+2 safety/equipment       General transfer comment: 2 assist to power up from bed and BSC.Marland Kitchen Pivot steps to [facility]  and then back to bed. patient is anxious about  standing and moving to the [facility]. Patient reassured and encouraged.  Ambulation/Gait                Stairs            Wheelchair Mobility    Modified Rankin (Stroke Patients Only)       Balance Overall balance assessment: Needs assistance Sitting-balance support: Feet supported;Bilateral upper extremity supported Sitting balance-Leahy Scale: Fair     Standing balance support: During functional activity;Bilateral upper extremity supported Standing balance-Leahy Scale: Poor                               Pertinent Vitals/Pain Pain Assessment: No/denies pain    Home Living Family/patient expects to be discharged to:: Assisted living               Home Equipment: Wheelchair - [employer] - 2 wheels Additional Comments: [facility]    Prior Function Level of Independence: Needs assistance   Gait / Transfers Assistance Needed: self propels her w/c - patient  not clear as to when she last could  go to meals via W/c  ADL's / Homemaking Assistance Needed: has CNA's who  assist, unclear how recnetly that she has required more assistance        Hand Dominance        Extremity/Trunk Assessment   Upper Extremity Assessment Upper Extremity Assessment:  (able to reach over head and assist pulling with arms to slide up in the bed.)    Lower Extremity  Assessment Lower Extremity Assessment: RLE deficits/detail RLE Deficits / Details: dressing in place on lower leg    Cervical / Trunk Assessment Cervical / Trunk Assessment: Normal  Communication   Communication: No difficulties  Cognition Arousal/Alertness: Awake/alert Behavior During Therapy: Anxious Overall Cognitive Status: Within Functional Limits for tasks assessed                                        General Comments      Exercises     Assessment/Plan    PT Assessment Patient needs continued PT services  PT Problem List Decreased strength;Decreased  activity tolerance;Decreased mobility;Decreased knowledge of use of DME;Decreased safety awareness;Decreased knowledge of precautions       PT Treatment Interventions DME instruction;Gait training;Functional mobility training;Therapeutic activities;Patient/family education    PT Goals (Current goals can be found in the Care Plan section)  Acute Rehab PT Goals Patient Stated Goal: to walk PT Goal Formulation: With patient Time For Goal Achievement: 07/23/16 Potential to Achieve Goals: Fair    Frequency Min 3X/week   Barriers to discharge Decreased caregiver support      Co-evaluation               AM-PAC PT "6 Clicks" Daily Activity  Outcome Measure Difficulty turning over in bed (including adjusting bedclothes, sheets and blankets)?: A Lot Difficulty moving from lying on back to sitting on the side of the bed? : A Lot Difficulty sitting down on and standing up from a chair with arms (e.g., wheelchair, bedside commode, etc,.)?: A Lot Help needed moving to and from a bed to chair (including a wheelchair)?: A Lot Help needed walking in hospital room?: A Lot Help needed climbing 3-5 steps with a railing? : Total 6 Click Score: 11    End of Session   Activity Tolerance: Other (comment) (limited by  being anxious) Patient left: in bed;with call bell/phone within reach;with bed alarm set;with nursing/sitter in room Nurse Communication: Mobility status PT Visit Diagnosis: Difficulty in walking, not elsewhere classified (R26.2)    Time: 4827-0786 PT Time Calculation (min) (ACUTE ONLY): 27 min   Charges:   PT Evaluation $PT Eval Moderate Complexity: 1 Procedure PT Treatments $Therapeutic Activity: 8-22 mins   PT G CodesTresa Endo PT [phone]   Claretha Cooper 07/09/2016, 4:44 PM

## 2016-07-10 LAB — URINALYSIS, ROUTINE W REFLEX MICROSCOPIC
BACTERIA UA: NONE SEEN
BILIRUBIN URINE: NEGATIVE
Glucose, UA: NEGATIVE mg/dL
Hgb urine dipstick: NEGATIVE
Ketones, ur: NEGATIVE mg/dL
NITRITE: NEGATIVE
PH: 5 (ref 5.0–8.0)
Protein, ur: NEGATIVE mg/dL
SPECIFIC GRAVITY, URINE: 1.013 (ref 1.005–1.030)

## 2016-07-10 LAB — STREP PNEUMONIAE URINARY ANTIGEN: STREP PNEUMO URINARY ANTIGEN: NEGATIVE

## 2016-07-10 MED ORDER — POLYETHYLENE GLYCOL 3350 17 G PO PACK
17.0000 g | PACK | Freq: Every day | ORAL | Status: DC
  Administered 2016-07-10 – 2016-07-11 (×2): 17 g via ORAL
  Filled 2016-07-10 (×2): qty 1

## 2016-07-10 MED ORDER — POTASSIUM CHLORIDE CRYS ER 20 MEQ PO TBCR
40.0000 meq | EXTENDED_RELEASE_TABLET | Freq: Once | ORAL | Status: AC
  Administered 2016-07-10: 40 meq via ORAL
  Filled 2016-07-10: qty 2

## 2016-07-10 MED ORDER — METHYLPREDNISOLONE SODIUM SUCC 40 MG IJ SOLR
40.0000 mg | Freq: Two times a day (BID) | INTRAMUSCULAR | Status: DC
  Administered 2016-07-10 – 2016-07-11 (×3): 40 mg via INTRAVENOUS
  Filled 2016-07-10 (×3): qty 1

## 2016-07-10 MED ORDER — FUROSEMIDE 10 MG/ML IJ SOLN
40.0000 mg | Freq: Once | INTRAMUSCULAR | Status: AC
  Administered 2016-07-10: 40 mg via INTRAVENOUS
  Filled 2016-07-10: qty 4

## 2016-07-10 NOTE — Progress Notes (Addendum)
CSW following for disposition/ DC planning. Pt from [facility]. PT eval recommends pt would benefit from SNF for rehab following DC however pt declines SNF.  CSW contacted [facility] to inquire as to assistance available at facility and possibility of pt receiving PT there.  Animator will call back.  Sharren Bridge, MSW, LCSW Clinical Social Work 07/10/2016 [phone]  Received call back from ALF. Blondie states facility can manage pt's needs at DC. Asks that updated FL2 with DC information be sent via HUB when pt ready for DC.  Updated MD.  Plan: Return to [facility] at [place] [facility].

## 2016-07-10 NOTE — Progress Notes (Signed)
Physical Therapy Treatment Patient Details Name: [name] MRN: [id number] DOB: Jul 14, 1930 Today's Date: 07/10/2016    History of Present Illness [name] is a 83 y.o. female with medical history significant of bronchial asthma, hypertension, breast cancer, chronic right leg ulcer-who presented to the ED 07/07/16 with progressive weakness, wheezing, fever. Recent admission to hospital in 3/18 for pneumonia.    PT Comments    Pt assisted OOB to recliner.  Pt reports feeling a little less anxious about mobility today however still present.  Pt wishes to return home however continue to recommend SNF at this time.  Follow Up Recommendations  SNF (pt prefers to return home (ALF))     Equipment Recommendations  None recommended by PT    Recommendations for Other Services       Precautions / Restrictions Precautions Precautions: Fall Precaution Comments: chronic right  leg wound, wears soft c-collar at times (distant hx of cervical surgeries)    Mobility  Bed Mobility Overal bed mobility: Needs Assistance Bed Mobility: Supine to Sit     Supine to sit: Min assist     General bed mobility comments: slight assist for trunk upright, pt encouraged to perform on her own, therapist to assist as needed - presents with slight anxiety with mobility (pt states this is better today)  Transfers Overall transfer level: Needs assistance Equipment used: Rolling walker (2 wheeled) Transfers: Sit to/from [facility] Sit to Stand: Mod assist;+2 safety/equipment Stand pivot transfers: Mod assist;+2 safety/equipment       General transfer comment: initially mod assist to rise, min assist with standing, mod assist with pivoting most likely due to anxiety, verbal cues provided for technique, pt presents with difficulty lifing bil LEs - increased effort and very small steps  Ambulation/Gait                 Stairs            Wheelchair Mobility     Modified Rankin (Stroke Patients Only)       Balance           Standing balance support: During functional activity;Bilateral upper extremity supported Standing balance-Leahy Scale: Poor                              Cognition Arousal/Alertness: Awake/alert Behavior During Therapy: Anxious Overall Cognitive Status: Within Functional Limits for tasks assessed                                        Exercises      General Comments        Pertinent Vitals/Pain Pain Assessment: No/denies pain    Home Living                      Prior Function            PT Goals (current goals can now be found in the care plan section) Progress towards PT goals: Progressing toward goals    Frequency    Min 3X/week      PT Plan Current plan remains appropriate    Co-evaluation              AM-PAC PT "6 Clicks" Daily Activity  Outcome Measure  Difficulty turning over in bed (including adjusting bedclothes, sheets and blankets)?: A Little Difficulty  moving from lying on back to sitting on the side of the bed? : A Lot Difficulty sitting down on and standing up from a chair with arms (e.g., wheelchair, bedside commode, etc,.)?: A Lot Help needed moving to and from a bed to chair (including a wheelchair)?: A Lot Help needed walking in hospital room?: A Lot Help needed climbing 3-5 steps with a railing? : Total 6 Click Score: 12    End of Session Equipment Utilized During Treatment: Gait belt Activity Tolerance: Patient tolerated treatment well Patient left: with call bell/phone within reach;with nursing/sitter in room;in chair Nurse Communication: Mobility status (NT assisted with transfer) PT Visit Diagnosis: Difficulty in walking, not elsewhere classified (R26.2)     Time: 1007-1219 PT Time Calculation (min) (ACUTE ONLY): 16 min  Charges:  $Therapeutic Activity: 8-22 mins                    G Codes:       Carmelia Bake,  PT, DPT 07/10/2016 Pager: [phone]    York Ram E 07/10/2016, 11:15 AM

## 2016-07-10 NOTE — Evaluation (Signed)
Occupational Therapy Evaluation Patient Details Name: [name] MRN: [id number] DOB: [birth year] Today's Date: 07/10/2016    History of Present Illness [name] is a 83 y.o. female with medical history significant of bronchial asthma, hypertension, breast cancer, chronic right leg ulcer-who presented to the ED 07/07/16 with progressive weakness, wheezing, fever. Recent admission to hospital in 3/18 for pneumonia.   Clinical Impression   Dr Truett Mainland was admitted for the above. She states that she has assistance available at ALF for adls/transfers as needed.  She uses her w/c and transfers to commode with elevated toilet seat with rails. She will benefit from continued OT to increase safety and independence with adls and toilet transfers.  Pt was not willing to stand during OT today, and PT notes that she is anxious with standing/transferring. Goals in acute are for min A levels.    Follow Up Recommendations  Home health OT (if ALF can provide needed assistance for mobility/adls)    Equipment Recommendations    none by OT   Recommendations for Other Services       Precautions / Restrictions Precautions Precautions: Fall Precaution Comments: chronic right  leg wound, wears soft c-collar at times (distant hx of cervical surgeries) Restrictions Weight Bearing Restrictions: No      Mobility Bed Mobility Overal bed mobility: Needs Assistance Bed Mobility: Supine to Sit     Supine to sit: Min assist Sit to supine: Mod assist   General bed mobility comments: used rails; light assist to sit up and assist for bil LEs when lying down. Pt was able to use rails and push feet into bed to scoot up in bed with min A  Transfers Overall transfer level: Needs assistance Equipment used: Rolling walker (2 wheeled) Transfers: Sit to/from [facility] Sit to Stand: Mod assist;+2 safety/equipment Stand pivot transfers: Mod assist;+2 safety/equipment       General transfer  comment: not performed by OT at pt's request (performed by PT)    Balance     Sitting balance-Leahy Scale: Fair     Standing balance support: During functional activity;Bilateral upper extremity supported Standing balance-Leahy Scale: Poor                             ADL either performed or assessed with clinical judgement   ADL Overall ADL's : Needs assistance/impaired     Grooming: Set up;Sitting   Upper Body Bathing: Set up;Sitting   Lower Body Bathing: Moderate assistance;Sit to/from stand;+2 for safety/equipment   Upper Body Dressing : Set up;Sitting   Lower Body Dressing: Moderate assistance;Sit to/from stand;With adaptive equipment;+2 for safety/equipment                 General ADL Comments: pt agreeable only to getting to EOB.  States she has been up twice today and did not want to sit on commode as she has just been dribbling since getting lasix.  Pt has AE at home and can call staff for assistance. She has chronic RLE wound and cannot use sock aide for this     Vision         Perception     Praxis      Pertinent Vitals/Pain Pain Assessment: Faces Hurts Even More Neck Repositioned,pt requested pain medication;limited activity;monitored     Hand Dominance     Extremity/Trunk Assessment Upper Extremity Assessment Upper Extremity Assessment: Generalized weakness (has difficulty extending R 4th/5th digits)  Communication Communication Communication: No difficulties   Cognition Arousal/Alertness: Awake/alert Behavior During Therapy: Anxious Overall Cognitive Status: Within Functional Limits for tasks assessed                                     General Comments       Exercises     Shoulder Instructions      Home Living Family/patient expects to be discharged to:: Assisted living                             Home Equipment: Wheelchair - manual;Walker - 2 wheels;Toilet riser    Additional Comments: [facility]      Prior Functioning/Environment Level of Independence: Needs assistance  Gait / Transfers Assistance Needed: self propels her w/c - patient  not clear as to when she last could  go to meals via W/c ADL's / Homemaking Assistance Needed: has CNA's who  assist, unclear how recnetly that she has required more assistance   Comments: pt states she can call staff for assistance        OT Problem List: Decreased strength;Decreased activity tolerance;Decreased knowledge of use of DME or AE;Pain      OT Treatment/Interventions: Self-care/ADL training;DME and/or AE instruction;Patient/family education;Balance training    OT Goals(Current goals can be found in the care plan section) Acute Rehab OT Goals Patient Stated Goal: to walk OT Goal Formulation: With patient Time For Goal Achievement: 07/17/16 Potential to Achieve Goals: Good ADL Goals Pt Will Perform Lower Body Bathing: with min assist;sit to/from stand;with adaptive equipment Pt Will Perform Lower Body Dressing: with min assist;sit to/from stand;with adaptive equipment (pants) Pt Will Transfer to Toilet: with min assist;stand pivot transfer;bedside commode Pt Will Perform Toileting - Clothing Manipulation and hygiene: with min assist;sitting/lateral leans;sit to/from stand  OT Frequency: Min 2X/week   Barriers to D/C:            Co-evaluation              AM-PAC PT "6 Clicks" Daily Activity     Outcome Measure Help from another person eating meals?: None Help from another person taking care of personal grooming?: A Little Help from another person toileting, which includes using toliet, bedpan, or urinal?: A Lot Help from another person bathing (including washing, rinsing, drying)?: A Lot Help from another person to put on and taking off regular upper body clothing?: A Little Help from another person to put on and taking off regular lower body clothing?: A Lot 6 Click Score: 16    End of Session    Activity Tolerance: Patient limited by fatigue;Patient limited by pain Patient left: in bed;with call bell/phone within reach  OT Visit Diagnosis: Muscle weakness (generalized) (M62.81)                Time: 3762-8315 OT Time Calculation (min): 23 min Charges:  OT General Charges $OT Visit: 1 Procedure OT Evaluation $OT Eval Low Complexity: 1 Procedure G-Codes:     [place], OTR/L [phone] 07/10/2016  Kamoria Lucien 07/10/2016, 2:05 PM

## 2016-07-10 NOTE — Progress Notes (Signed)
PROGRESS NOTE    [name]  YFV:[id number] DOB: October 19, 1930 DOA: 07/07/2016 PCP: Reynold Bowen, MD   Brief Narrative: [name] is a 83 y.o. female with medical history significant of bronchial asthma, hypertension, prior history of breast cancer, chronic right leg ulcer-who presented to the ED for evaluation of the above-noted complaints. Per patient, approximately 10 days back, she started having low-grade fever, cough and was just not feeling right. Apparently she was seen by her primary care practitioner, a chest x-ray was done (was negative for pneumonia) and was prescribed levofloxacin-however her symptoms did not improve. She claims that the cough is usually productive with clear phlegm, there is no history of hemoptysis. She claims that she feels weak and just does not feel like her usual self-when asked if she has myalgias and arthralgias-she denies this. Because of continued fever, cough, and weakness, she was transferred to the emergency room today for further evaluation and treatment.  Apparently her husband has had similar complaints/issues as well-and apparently has been told he has pneumonia but this was not seen on x-ray. Her husband is with her in the same room at the assisted living facility   Assessment & Plan:   Active Problems:   INSOMNIA, PERSISTENT   Depression   Essential hypertension   Breast cancer, right breast (HCC)   SIRS (systemic inflammatory response syndrome) (HCC)   HCAP (healthcare-associated pneumonia)   Chronic ulcer of right leg (HCC)   SIRS, Bronchitis  ? Dyspnea.  Received vancomycin and cefepime for 2 days.   chest x ray, pleural effusion, no infiltrates.  Chronic wound no sign of infection.  c diff negative.  Respiratory panel negative,   blood cultures no growth to date.  Afebrile, WBC trending down.  Continue with Augmentin.   Lasix , patient with bilateral crackles,   Asthma exacerbation;  Nebulizer.  Worsening wheezing.  Change back to IV solumedrol.    Hypokalemia; resolved.    HTN; hold HCTZ SBP in the 120 range.   Depression;  Continue with lexapro, Wellbutrin/   History of right leg ulceration; Local Wound care   History of right breast cancer:  DVT prophylaxis: Lovenox.  Code Status: DNR Family Communication:care discussed with patient.  Disposition Plan: probably ALF in 24 hours.    Consultants:   none   Procedures:  none   Antimicrobials:  Vancomycin and cefepime 5-22   Subjective: Having worsening dyspnea today. Coughing more.    Objective: Vitals:   07/09/16 1400 07/09/16 2229 07/10/16 0620 07/10/16 0933  BP: 122/64 128/60 (!) 143/69   Pulse: 91 96 68   Resp:  16 14   Temp: 98.6 F (37 C) 98.2 F (36.8 C) 98.1 F (36.7 C)   TempSrc: Oral Oral Oral   SpO2: 95% 96% 97% 94%  Weight:        Intake/Output Summary (Last 24 hours) at 07/10/16 1019 Last data filed at 07/10/16 1002  Gross per 24 hour  Intake              240 ml  Output                0 ml  Net              240 ml   Filed Weights   07/07/16 1245  Weight: 92.9 kg (204 lb 12.9 oz)    Examination:  General exam: NAD Respiratory system:  Bilateral crackles and wheezing.  Cardiovascular system; S 1, S2 rrr Gastrointestinal  system: soft, NT, ND Central nervous system: Alert and oriented. No focal neurological deficits. Extremities: Symmetric 5 x 5 power. Skin: clean dressing right leg.     Data Reviewed: I have personally reviewed following labs and imaging studies  CBC:  Recent Labs Lab 07/07/16 0844 07/07/16 1310 07/09/16 0535  WBC 16.9* 14.5* 13.1*  NEUTROABS 14.2*  --   --   HGB 13.9 12.0 11.5*  HCT 40.7 36.3 34.6*  MCV 94.7 96.0 95.6  PLT 187 153 973   Basic Metabolic Panel:  Recent Labs Lab 07/07/16 0844 07/07/16 1310 07/08/16 0829 07/09/16 0535  NA 133*  --  138 139  K 3.3*  --  3.6 3.8  CL 99*  --  108 107  CO2 25  --  24 26  GLUCOSE 112*  --  156* 114*  BUN  10  --  13 17  CREATININE 0.53 0.56 0.48 0.58  CALCIUM 8.8*  --  8.9 8.9   GFR: Estimated Creatinine Clearance: 60.1 mL/min (by C-G formula based on SCr of 0.58 mg/dL). Liver Function Tests:  Recent Labs Lab 07/07/16 0844  AST 16  ALT 14  ALKPHOS 67  BILITOT 1.0  PROT 6.6  ALBUMIN 3.5   No results for input(s): LIPASE, AMYLASE in the last 168 hours. No results for input(s): AMMONIA in the last 168 hours. Coagulation Profile:  Recent Labs Lab 07/07/16 0844  INR 1.18   Cardiac Enzymes: No results for input(s): CKTOTAL, CKMB, CKMBINDEX, TROPONINI in the last 168 hours. BNP (last 3 results) No results for input(s): PROBNP in the last 8760 hours. HbA1C: No results for input(s): HGBA1C in the last 72 hours. CBG: No results for input(s): GLUCAP in the last 168 hours. Lipid Profile: No results for input(s): CHOL, HDL, LDLCALC, TRIG, CHOLHDL, LDLDIRECT in the last 72 hours. Thyroid Function Tests: No results for input(s): TSH, T4TOTAL, FREET4, T3FREE, THYROIDAB in the last 72 hours. Anemia Panel: No results for input(s): VITAMINB12, FOLATE, FERRITIN, TIBC, IRON, RETICCTPCT in the last 72 hours. Sepsis Labs:  Recent Labs Lab 07/07/16 0848  LATICACIDVEN 0.71    Recent Results (from the past 240 hour(s))  Culture, blood (Routine x 2)     Status: None (Preliminary result)   Collection Time: 07/07/16  8:44 AM  Result Value Ref Range Status   Specimen Description BLOOD RIGHT ANTECUBITAL  Final   Special Requests   Final    BOTTLES DRAWN AEROBIC AND ANAEROBIC Blood Culture adequate volume   Culture   Final    NO GROWTH 2 DAYS Performed at [facility], [address] [place], [place] [address]    Report Status PENDING  Incomplete  Culture, blood (Routine x 2)     Status: None (Preliminary result)   Collection Time: 07/07/16  8:44 AM  Result Value Ref Range Status   Specimen Description BLOOD RIGHT HAND  Final   Special Requests   Final    BOTTLES DRAWN AEROBIC  AND ANAEROBIC Blood Culture adequate volume   Culture   Final    NO GROWTH 2 DAYS Performed at [facility], [place] [address], [place], [place] [address]    Report Status PENDING  Incomplete  Gastrointestinal Panel by PCR , Stool     Status: None   Collection Time: 07/07/16 10:59 AM  Result Value Ref Range Status   Campylobacter species NOT DETECTED NOT DETECTED Final   Plesimonas shigelloides NOT DETECTED NOT DETECTED Final   Salmonella species NOT DETECTED  NOT DETECTED Final   Yersinia enterocolitica NOT DETECTED NOT DETECTED Final   Vibrio species NOT DETECTED NOT DETECTED Final   Vibrio cholerae NOT DETECTED NOT DETECTED Final   Enteroaggregative E coli (EAEC) NOT DETECTED NOT DETECTED Final   Enteropathogenic E coli (EPEC) NOT DETECTED NOT DETECTED Final   Enterotoxigenic E coli (ETEC) NOT DETECTED NOT DETECTED Final   Shiga like toxin producing E coli (STEC) NOT DETECTED NOT DETECTED Final   Shigella/Enteroinvasive E coli (EIEC) NOT DETECTED NOT DETECTED Final   Cryptosporidium NOT DETECTED NOT DETECTED Final   Cyclospora cayetanensis NOT DETECTED NOT DETECTED Final   Entamoeba histolytica NOT DETECTED NOT DETECTED Final   Giardia lamblia NOT DETECTED NOT DETECTED Final   Adenovirus F40/41 NOT DETECTED NOT DETECTED Final   Astrovirus NOT DETECTED NOT DETECTED Final   Norovirus GI/GII NOT DETECTED NOT DETECTED Final   Rotavirus A NOT DETECTED NOT DETECTED Final   Sapovirus (I, II, IV, and V) NOT DETECTED NOT DETECTED Final  C difficile quick scan w PCR reflex     Status: None   Collection Time: 07/07/16 10:59 AM  Result Value Ref Range Status   C Diff antigen NEGATIVE NEGATIVE Final   C Diff toxin NEGATIVE NEGATIVE Final   C Diff interpretation No C. difficile detected.  Final  Respiratory Panel by PCR     Status: None   Collection Time: 07/07/16  3:40 PM  Result Value Ref Range Status   Adenovirus NOT DETECTED NOT DETECTED Final   Coronavirus 229E NOT DETECTED NOT  DETECTED Final   Coronavirus HKU1 NOT DETECTED NOT DETECTED Final   Coronavirus NL63 NOT DETECTED NOT DETECTED Final   Coronavirus OC43 NOT DETECTED NOT DETECTED Final   Metapneumovirus NOT DETECTED NOT DETECTED Final   Rhinovirus / Enterovirus NOT DETECTED NOT DETECTED Final   Influenza A NOT DETECTED NOT DETECTED Final   Influenza B NOT DETECTED NOT DETECTED Final   Parainfluenza Virus 1 NOT DETECTED NOT DETECTED Final   Parainfluenza Virus 2 NOT DETECTED NOT DETECTED Final   Parainfluenza Virus 3 NOT DETECTED NOT DETECTED Final   Parainfluenza Virus 4 NOT DETECTED NOT DETECTED Final   Respiratory Syncytial Virus NOT DETECTED NOT DETECTED Final   Bordetella pertussis NOT DETECTED NOT DETECTED Final   Chlamydophila pneumoniae NOT DETECTED NOT DETECTED Final   Mycoplasma pneumoniae NOT DETECTED NOT DETECTED Final    Comment: Performed at [facility], [place] [address], [place], [place] [address]  MRSA PCR Screening     Status: Abnormal   Collection Time: 07/07/16  4:06 PM  Result Value Ref Range Status   MRSA by PCR POSITIVE (A) NEGATIVE Final    Comment:        The GeneXpert MRSA Assay (FDA approved for NASAL specimens only), is one component of a comprehensive MRSA colonization surveillance program. It is not intended to diagnose MRSA infection nor to guide or monitor treatment for MRSA infections. RESULT CALLED TO, READ BACK BY AND VERIFIED WITH: A.HEAVNER,RN 2006 07/07/16 W.SHEA          Radiology Studies: Dg Chest 2 View  Result Date: 07/09/2016 CLINICAL DATA:  Fever and shortness of Breath EXAM: CHEST  2 VIEW COMPARISON:  07/07/2016 FINDINGS: Cardiac shadow is within normal limits. Small bilateral pleural effusions are noted. No focal infiltrate is noted. No acute bony abnormality is seen. Postsurgical changes in the cervical spine are noted. IMPRESSION: Small bilateral pleural effusions without focal infiltrate Electronically Signed   By: Elta Guadeloupe  Lukens M.D.   On:  07/09/2016 09:16        Scheduled Meds: . amoxicillin-clavulanate  1 tablet Oral Q12H  . aspirin EC  325 mg Oral Daily  . budesonide (PULMICORT) nebulizer solution  0.25 mg Nebulization BID  . buPROPion  300 mg Oral Daily  . calcium-vitamin D  1 tablet Oral Q breakfast  . chlorhexidine  15 mL Mouth Rinse BID  . Chlorhexidine Gluconate Cloth  6 each Topical Q0600  . cholecalciferol  2,000 Units Oral Daily  . docusate sodium  100 mg Oral Daily  . enoxaparin (LOVENOX) injection  40 mg Subcutaneous Q24H  . escitalopram  10 mg Oral Daily  . fluticasone  1 spray Each Nare BID  . furosemide  40 mg Intravenous Once  . gabapentin  300 mg Oral TID  . guaiFENesin  1,200 mg Oral BID  . ipratropium-albuterol  3 mL Nebulization BID  . mouth rinse  15 mL Mouth Rinse q12n4p  . methylPREDNISolone (SOLU-MEDROL) injection  40 mg Intravenous Q12H  . montelukast  10 mg Oral QHS  . mupirocin ointment  1 application Nasal BID  . polyethylene glycol  17 g Oral Daily  . potassium chloride  40 mEq Oral Once   Continuous Infusions:    LOS: 3 days    Time spent: 35 minutes     Elmarie Shiley, MD [facility] Pager [phone]  If 7PM-7AM, please contact night-coverage [email or web address] Password [email or web address] 07/10/2016, 10:19 AM

## 2016-07-11 MED ORDER — BENZONATATE 200 MG PO CAPS: 200.0000 mg | ORAL_CAPSULE | Freq: Three times a day (TID) | ORAL | 0 refills | Status: DC | PRN

## 2016-07-11 MED ORDER — IPRATROPIUM-ALBUTEROL 0.5-2.5 (3) MG/3ML IN SOLN: 3.0000 mL | Freq: Two times a day (BID) | RESPIRATORY_TRACT | 0 refills | Status: DC

## 2016-07-11 MED ORDER — PREDNISONE 20 MG PO TABS: ORAL_TABLET | ORAL | 0 refills | Status: DC

## 2016-07-11 MED ORDER — AMOXICILLIN-POT CLAVULANATE 875-125 MG PO TABS: 1.0000 | ORAL_TABLET | Freq: Two times a day (BID) | ORAL | 0 refills | Status: DC

## 2016-07-11 NOTE — NC FL2 (Signed)
[place] LEVEL OF CARE SCREENING TOOL     IDENTIFICATION  Patient Name: [name] Birthdate: December 01, 1930 Sex: female Admission Date (Current Location): 07/07/2016  [facility] and [place] Number:  Herbalist and Address:  [facility],  [place] [address], [place]      Provider Number: [id number]  Attending Physician Name and Address:  Elmarie Shiley, MD  Relative Name and Phone Number:       Current Level of Care: Hospital Recommended Level of Care: [place] Prior Approval Number:    Date Approved/Denied:   PASRR Number:    Discharge Plan: Domiciliary (Rest home)    Current Diagnoses: Patient Active Problem List   Diagnosis Date Noted  . HCAP (healthcare-associated pneumonia) 07/07/2016  . Chronic ulcer of right leg ([place]) 07/07/2016  . SIRS (systemic inflammatory response syndrome) ([place]) 05/08/2016  . Community acquired pneumonia 05/08/2016  . Failed total hip arthroplasty ([place]) 07/10/2015  . Breast cancer, right breast ([place]) 07/18/2014  . CONCUSSION WITH LOC OF UNSPECIFIED DURATION 07/19/2009  . OTH SYMPTOMS INVOLVING RESPIRATORY SYSTEM&CHEST 11/12/2008  . UNSPECIFIED DISORDER OF ANKLE AND FOOT JOINT 05/11/2008  . CERUMEN IMPACTION, BILATERAL 05/04/2008  . DYSMETABOLIC SYNDROME X 50/93/2671  . UNS ADVRS EFF UNS RX MEDICINAL&BIOLOGICAL SBSTNC 08/12/2007  . Other and unspecified hyperlipidemia 04/01/2007  . INSOMNIA, PERSISTENT 09/09/2006  . Depression 09/09/2006  . Essential hypertension 09/09/2006  . Asthma, chronic, unspecified asthma severity, with acute exacerbation 09/09/2006  . DIVERTICULOSIS, COLON 09/09/2006  . OSTEOARTHRITIS 09/09/2006  . STENOSIS, LUMBAR SPINE 09/09/2006  . COLONIC POLYPS, HX OF 09/09/2006    Orientation RESPIRATION BLADDER Height & Weight     Self, Time, Situation, Place  Normal Incontinent Weight: 204 lb 12.9 oz (92.9 kg) Height:     BEHAVIORAL SYMPTOMS/MOOD  NEUROLOGICAL BOWEL NUTRITION STATUS      Continent Diet (Heart healthy; thin fluids)  AMBULATORY STATUS COMMUNICATION OF NEEDS Skin   Limited Assist Verbally Other (Comment) (Non-pressure wound; right leg skin tear)                       Personal Care Assistance Level of Assistance  Bathing, Feeding, Dressing Bathing Assistance: Limited assistance Feeding assistance: Independent Dressing Assistance: Limited assistance     Functional Limitations Info  Sight, Hearing, Speech Sight Info: Adequate Hearing Info: Adequate Speech Info: Adequate    SPECIAL CARE FACTORS FREQUENCY  PT (By licensed PT), OT (By licensed OT)     PT Frequency: 3x OT Frequency: 2x            Contractures Contractures Info: Not present    Additional Factors Info  Code Status, Allergies, Isolation Precautions Code Status Info: DNR Allergies Info: Oxycodone Hcl Oxycodone Hcl, Morphine Sulfate, Sulfamethoxazole     Isolation Precautions Info: Contact precautions     Current Medications (07/11/2016):  This is the current hospital active medication list Current Facility-Administered Medications  Medication Dose Route Frequency Provider Last Rate Last Dose  . acetaminophen (TYLENOL) tablet 650 mg  650 mg Oral Q6H PRN Jonetta Osgood, MD   650 mg at 07/09/16 1127  . amoxicillin-clavulanate (AUGMENTIN) 875-125 MG per tablet 1 tablet  1 tablet Oral Q12H Regalado, Belkys A, MD   1 tablet at 07/11/16 1111  . aspirin EC tablet 325 mg  325 mg Oral Daily Jonetta Osgood, MD   325 mg at 07/11/16 1110  . benzonatate (TESSALON) capsule 200 mg  200 mg Oral  TID PRN Jonetta Osgood, MD      . budesonide (PULMICORT) nebulizer solution 0.25 mg  0.25 mg Nebulization BID Jonetta Osgood, MD   0.25 mg at 07/10/16 2021  . buPROPion (WELLBUTRIN XL) 24 hr tablet 300 mg  300 mg Oral Daily Jonetta Osgood, MD   300 mg at 07/11/16 1109  . calcium-vitamin D (OSCAL WITH D) 500-200 MG-UNIT per tablet 1 tablet  1  tablet Oral Q breakfast Jonetta Osgood, MD   1 tablet at 07/11/16 [phone]  . chlorhexidine (PERIDEX) 0.12 % solution 15 mL  15 mL Mouth Rinse BID Jonetta Osgood, MD   15 mL at 07/11/16 1112  . Chlorhexidine Gluconate Cloth 2 % PADS 6 each  6 each Topical Q0600 Regalado, Belkys A, MD   6 each at 07/10/16 0600  . cholecalciferol (VITAMIN D) tablet 2,000 Units  2,000 Units Oral Daily Jonetta Osgood, MD   2,000 Units at 07/11/16 1111  . clonazePAM (KLONOPIN) tablet 1 mg  1 mg Oral QHS PRN Jonetta Osgood, MD   1 mg at 07/10/16 2220  . docusate sodium (COLACE) capsule 100 mg  100 mg Oral Daily Jonetta Osgood, MD   100 mg at 07/11/16 1110  . enoxaparin (LOVENOX) injection 40 mg  40 mg Subcutaneous Q24H Jonetta Osgood, MD   40 mg at 07/10/16 2216  . escitalopram (LEXAPRO) tablet 10 mg  10 mg Oral Daily Jonetta Osgood, MD   10 mg at 07/11/16 1111  . fluticasone (FLONASE) 50 MCG/ACT nasal spray 1 spray  1 spray Each Nare BID Jonetta Osgood, MD   1 spray at 07/11/16 1112  . gabapentin (NEURONTIN) capsule 300 mg  300 mg Oral TID Jonetta Osgood, MD   300 mg at 07/11/16 1109  . guaiFENesin (MUCINEX) 12 hr tablet 1,200 mg  1,200 mg Oral BID Jonetta Osgood, MD   1,200 mg at 07/11/16 1110  . HYDROcodone-acetaminophen (NORCO/VICODIN) 5-325 MG per tablet 1-2 tablet  1-2 tablet Oral Q6H PRN Regalado, Belkys A, MD   2 tablet at 07/11/16 0839  . ipratropium-albuterol (DUONEB) 0.5-2.5 (3) MG/3ML nebulizer solution 3 mL  3 mL Nebulization BID Jonetta Osgood, MD   3 mL at 07/10/16 2021  . MEDLINE mouth rinse  15 mL Mouth Rinse q12n4p Jonetta Osgood, MD   15 mL at 07/11/16 1112  . menthol-cetylpyridinium (CEPACOL) lozenge 3 mg  1 lozenge Oral Q4H PRN Jonetta Osgood, MD      . methocarbamol (ROBAXIN) tablet 500 mg  500 mg Oral Q6H PRN Jonetta Osgood, MD   500 mg at 07/10/16 1633  . methylPREDNISolone sodium succinate (SOLU-MEDROL) 40 mg/mL injection 40 mg  40 mg Intravenous  Q12H Regalado, Belkys A, MD   40 mg at 07/11/16 1107  . montelukast (SINGULAIR) tablet 10 mg  10 mg Oral QHS Jonetta Osgood, MD   10 mg at 07/10/16 2220  . mupirocin ointment (BACTROBAN) 2 % 1 application  1 application Nasal BID Regalado, Belkys A, MD   1 application at 33/00/76 1112  . ondansetron (ZOFRAN) injection 4 mg  4 mg Intravenous Q6H PRN Ghimire, Shanker M, MD      . polyethylene glycol (MIRALAX / GLYCOLAX) packet 17 g  17 g Oral Daily Regalado, Belkys A, MD   17 g at 07/11/16 1108  . polyvinyl alcohol (LIQUIFILM TEARS) 1.4 % ophthalmic solution 1 drop  1 drop Both Eyes PRN Ghimire, [employer]  M, MD         Discharge Medications: Please see discharge summary for a list of discharge medications.  Relevant Imaging Results:  Relevant Lab Results:   Additional Information SSN: [id number]  Truitt Merle, LCSW

## 2016-07-11 NOTE — Progress Notes (Signed)
[facility] is here and pt is leaving for [facility] at this time.

## 2016-07-11 NOTE — Clinical Social Work Note (Addendum)
Pt is ready for discharge today and returning to [facility]F. Pt and daughter-Ruth Gold [phone]) aware and in agreement with discharge plan. CSW confirmed pt's return to ALF with Shameka [phone]) at [facility] and confirmed signed FL-2, discharge summary, and clinicals received via fax. CSW added room and report to treatment team sticky note and made RN Abigail Butts aware. CSW arranged transportation with [facility]. Signed DNR, Rx, and medical necessity transport form on pt chart. CSW signing off as no further social work needs identified.   Oretha Ellis, [place], [place] Work [phone]

## 2016-07-11 NOTE — Progress Notes (Signed)
Writer called [facility] to give report to receiving nurse. Was told [facility] is ALF and the nurse will call me back if she needs any information. Writer left name and number of hospital phone. Pt alert and oriented, and is enthusiastic about returning to [facility].  Per SW, information has been given to [facility] as needed and family is aware of pt returning.

## 2016-07-11 NOTE — Discharge Summary (Signed)
Physician Discharge Summary  [name] KNL:[id number] DOB: 11/07/1930 DOA: 07/07/2016  PCP: Reynold Bowen, MD  Admit date: 07/07/2016 Discharge date: 07/11/2016  Admitted From: ALF  Disposition: ALF  Recommendations for Outpatient Follow-up:  1. Follow up with PCP in 1-2 weeks 2. Please obtain BMP/CBC in one week   Home Health: Yes, PT  Discharge Condition; stable.  CODE STATUS: DNR Diet recommendation: Heart Healthy  Brief/Interim Summary: [name] a 83 y.o.femalewith medical history significant of bronchial asthma, hypertension, prior history of breast cancer, chronic right leg ulcer-who presented to the ED for evaluation of the above-noted complaints. Per patient, approximately 10 days back, she started having low-grade fever, cough and was just not feeling right. Apparently she was seen by her primary care practitioner, a chest x-ray was done (was negative for pneumonia) and was prescribed levofloxacin-however her symptoms did not improve. She claims that the cough is usually productive with clear phlegm, there is no history of hemoptysis. She claims that she feels weak and just does not feel like her usual self-when asked if she has myalgias and arthralgias-she denies this. Because of continued fever, cough, and weakness, she was transferred to the emergency room today for further evaluation and treatment.  Apparently her husband has had similar complaints/issues as well-and apparently has been told he has pneumonia but this was not seen on x-ray. Her husband is with her in the same room at the assisted living facility   Assessment & Plan:   Active Problems:   INSOMNIA, PERSISTENT   Depression   Essential hypertension   Breast cancer, right breast (HCC)   SIRS (systemic inflammatory response syndrome) (HCC)   HCAP (healthcare-associated pneumonia)   Chronic ulcer of right leg (HCC)   SIRS, Bronchitis.  Received vancomycin and cefepime for 2 days.    chest x ray, pleural effusion, no infiltrates.  Chronic wound no sign of infection.  c diff negative.  Respiratory panel negative,   blood cultures no growth to date.  Afebrile, WBC trending down.  Continue with Augmentin.     Asthma exacerbation;  Nebulizer.  Improved. No wheezing on lung exam. Discharge on prednisone taper.    Hypokalemia; resolved.    HTN; resume  HCTZ.   Depression;  Continue with lexapro, Wellbutrin/   History of right leg ulceration; Local Wound care   History of right breast cancer:  Discharge Diagnoses:  Active Problems:   INSOMNIA, PERSISTENT   Depression   Essential hypertension   Breast cancer, right breast (HCC)   SIRS (systemic inflammatory response syndrome) ([place])   HCAP (healthcare-associated pneumonia)   Chronic ulcer of right leg [facility])    Discharge Instructions  Discharge Instructions    Diet - low sodium heart healthy    Complete by:  As directed    Increase activity slowly    Complete by:  As directed      Allergies as of 07/11/2016      Reactions   Oxycodone Hcl [oxycodone Hcl] Rash   Morphine Sulfate Other (See Comments)   REACTION: very anxious   Sulfamethoxazole Other (See Comments)   REACTION: unspecified      Medication List    STOP taking these medications   azithromycin 500 MG tablet Commonly known as:  ZITHROMAX   cefUROXime 250 MG tablet Commonly known as:  CEFTIN   levofloxacin 500 MG tablet Commonly known as:  LEVAQUIN     TAKE these medications   acetaminophen 325 MG tablet Commonly known as:  TYLENOL  Take 2 tablets (650 mg total) by mouth every 6 (six) hours as needed for mild pain (or Fever >/= 101). What changed:  Another medication with the same name was removed. Continue taking this medication, and follow the directions you see here.   ADVIL 200 MG Caps Generic drug:  Ibuprofen Take 400 mg by mouth every 12 (twelve) hours as needed (pain).   amoxicillin-clavulanate 875-125  MG tablet Commonly known as:  AUGMENTIN Take 1 tablet by mouth every 12 (twelve) hours.   ARNICARE ARNICA Crea Apply 1 application topically as needed (muscle pain/stiffness).   aspirin EC 325 MG tablet Take 325 mg by mouth daily.   benzonatate 200 MG capsule Commonly known as:  TESSALON Take 1 capsule (200 mg total) by mouth 3 (three) times daily as needed for cough.   buPROPion 300 MG 24 hr tablet Commonly known as:  WELLBUTRIN XL Take 300 mg by mouth every morning.   CALCIUM+D3 600-800 MG-UNIT Tabs Generic drug:  Calcium Carb-Cholecalciferol Take 1 tablet by mouth daily.   clonazePAM 1 MG tablet Commonly known as:  KLONOPIN Take 1 tablet (1 mg total) by mouth at bedtime as needed (insomnia).   docusate sodium 100 MG capsule Commonly known as:  COLACE Take 100 mg by mouth daily.   escitalopram 10 MG tablet Commonly known as:  LEXAPRO Take 10 mg by mouth daily.   fluticasone 50 MCG/ACT nasal spray Commonly known as:  FLONASE Place 1 spray into both nostrils 2 (two) times daily.   gabapentin 300 MG capsule Commonly known as:  NEURONTIN Take 1 capsule (300 mg total) by mouth 3 (three) times daily.   guaiFENesin 600 MG 12 hr tablet Commonly known as:  MUCINEX Take 600 mg by mouth 2 (two) times daily as needed for cough or to loosen phlegm.   guaiFENesin-dextromethorphan 100-10 MG/5ML syrup Commonly known as:  ROBITUSSIN DM Take 15 mLs by mouth every 4 (four) hours as needed for cough.   hydrochlorothiazide 12.5 MG capsule Commonly known as:  MICROZIDE Take 12.5 mg by mouth daily.   HYDROcodone-acetaminophen 5-325 MG tablet Commonly known as:  NORCO/VICODIN Take 1 tablet by mouth every 6 (six) hours as needed for moderate pain.   ipratropium-albuterol 0.5-2.5 (3) MG/3ML Soln Commonly known as:  DUONEB Take 3 mLs by nebulization 2 (two) times daily.   menthol-cetylpyridinium 3 MG lozenge Commonly known as:  CEPACOL Take 1 lozenge by mouth every 4 (four)  hours as needed for sore throat.   methocarbamol 500 MG tablet Commonly known as:  ROBAXIN Take 1 tablet (500 mg total) by mouth every 6 (six) hours as needed for muscle spasms.   mometasone-formoterol 200-5 MCG/ACT Aero Commonly known as:  DULERA Inhale 2 puffs into the lungs 2 (two) times daily.   montelukast 10 MG tablet Commonly known as:  SINGULAIR Take 10 mg by mouth at bedtime.   mupirocin ointment 2 % Commonly known as:  BACTROBAN Place 1 application into the nose 2 (two) times daily. Use through 05/14/2016   ondansetron 4 MG tablet Commonly known as:  ZOFRAN Take 4 mg by mouth every 8 (eight) hours as needed for nausea or vomiting.   polyethylene glycol packet Commonly known as:  MIRALAX / GLYCOLAX Take 17 g by mouth daily as needed for mild constipation.   predniSONE 20 MG tablet Commonly known as:  DELTASONE Take 2 tablets for 3 days then 1 tablet for 2 days   albuterol (2.5 MG/3ML) 0.083% nebulizer solution Commonly known as:  PROVENTIL Take 2.5 mg by nebulization every 4 (four) hours as needed for wheezing or shortness of breath.   PROAIR HFA 108 (90 Base) MCG/ACT inhaler Generic drug:  albuterol Inhale 2 puffs into the lungs every 4 (four) hours as needed for shortness of breath.   Propylene Glycol-Glycerin 0.6-0.6 % Soln Commonly known as:  SOOTHE Apply 1 drop to eye 2 (two) times daily.   sodium phosphate 7-19 GM/118ML Enem Place 133 mLs (1 enema total) rectally once as needed for severe constipation.   Vitamin D 2000 units tablet Take 2,000 Units by mouth daily.      Contact information for after-discharge care    [facility] .   Specialty:  Assisted Living Facility Contact information: [phone] [address] [address] [phone]             Allergies  Allergen Reactions  . Oxycodone Hcl [Oxycodone Hcl] Rash  . Morphine Sulfate Other (See Comments)    REACTION: very  anxious  . Sulfamethoxazole Other (See Comments)    REACTION: unspecified    Consultations:  none   Procedures/Studies: Dg Chest 2 View  Result Date: 07/09/2016 CLINICAL DATA:  Fever and shortness of Breath EXAM: CHEST  2 VIEW COMPARISON:  07/07/2016 FINDINGS: Cardiac shadow is within normal limits. Small bilateral pleural effusions are noted. No focal infiltrate is noted. No acute bony abnormality is seen. Postsurgical changes in the cervical spine are noted. IMPRESSION: Small bilateral pleural effusions without focal infiltrate Electronically Signed   By: Inez Catalina M.D.   On: 07/09/2016 09:16   Dg Chest 2 View  Result Date: 07/07/2016 CLINICAL DATA:  History recent pneumonia. Suspected fever. Not feeling well. EXAM: CHEST  2 VIEW COMPARISON:  05/08/2016 FINDINGS: The heart size and mediastinal contours are within normal limits. Both lungs are clear. The visualized skeletal structures are unremarkable. IMPRESSION: No active cardiopulmonary disease. Electronically Signed   By: Kathreen Devoid   On: 07/07/2016 08:37      Subjective: Feeling better, breathing better, cough improved.   Discharge Exam: Vitals:   07/10/16 2052 07/11/16 0636  BP: (!) 144/68 (!) 160/76  Pulse: 89 66  Resp: 18 18  Temp: 98.4 F (36.9 C) 97.7 F (36.5 C)   Vitals:   07/10/16 0933 07/10/16 1440 07/10/16 2052 07/11/16 0636  BP:  (!) 144/71 (!) 144/68 (!) 160/76  Pulse:  95 89 66  Resp:  18 18 18   Temp:  98.5 F (36.9 C) 98.4 F (36.9 C) 97.7 F (36.5 C)  TempSrc:  Oral Oral Oral  SpO2: 94% 95% 94% 96%  Weight:        General: Pt is alert, awake, not in acute distress Cardiovascular: RRR, S1/S2 +, no rubs, no gallops Respiratory: CTA bilaterally, no wheezing, no rhonchi Abdominal: Soft, NT, ND, bowel sounds + Extremities: no edema, no cyanosis    The results of significant diagnostics from this hospitalization (including imaging, microbiology, ancillary and laboratory) are listed below  for reference.     Microbiology: Recent Results (from the past 240 hour(s))  Culture, blood (Routine x 2)     Status: None (Preliminary result)   Collection Time: 07/07/16  8:44 AM  Result Value Ref Range Status   Specimen Description BLOOD RIGHT ANTECUBITAL  Final   Special Requests   Final    BOTTLES DRAWN AEROBIC AND ANAEROBIC Blood Culture adequate volume   Culture   Final    NO  GROWTH 4 DAYS Performed at [facility], [place] [address], [place], [place] [address]    Report Status PENDING  Incomplete  Culture, blood (Routine x 2)     Status: None (Preliminary result)   Collection Time: 07/07/16  8:44 AM  Result Value Ref Range Status   Specimen Description BLOOD RIGHT HAND  Final   Special Requests   Final    BOTTLES DRAWN AEROBIC AND ANAEROBIC Blood Culture adequate volume   Culture   Final    NO GROWTH 4 DAYS Performed at [facility], [place] [address], [place], [place] [address]    Report Status PENDING  Incomplete  Gastrointestinal Panel by PCR , Stool     Status: None   Collection Time: 07/07/16 10:59 AM  Result Value Ref Range Status   Campylobacter species NOT DETECTED NOT DETECTED Final   Plesimonas shigelloides NOT DETECTED NOT DETECTED Final   Salmonella species NOT DETECTED NOT DETECTED Final   Yersinia enterocolitica NOT DETECTED NOT DETECTED Final   Vibrio species NOT DETECTED NOT DETECTED Final   Vibrio cholerae NOT DETECTED NOT DETECTED Final   Enteroaggregative E coli (EAEC) NOT DETECTED NOT DETECTED Final   Enteropathogenic E coli (EPEC) NOT DETECTED NOT DETECTED Final   Enterotoxigenic E coli (ETEC) NOT DETECTED NOT DETECTED Final   Shiga like toxin producing E coli (STEC) NOT DETECTED NOT DETECTED Final   Shigella/Enteroinvasive E coli (EIEC) NOT DETECTED NOT DETECTED Final   Cryptosporidium NOT DETECTED NOT DETECTED Final   Cyclospora cayetanensis NOT DETECTED NOT DETECTED Final   Entamoeba histolytica NOT DETECTED NOT DETECTED Final    Giardia lamblia NOT DETECTED NOT DETECTED Final   Adenovirus F40/41 NOT DETECTED NOT DETECTED Final   Astrovirus NOT DETECTED NOT DETECTED Final   Norovirus GI/GII NOT DETECTED NOT DETECTED Final   Rotavirus A NOT DETECTED NOT DETECTED Final   Sapovirus (I, II, IV, and V) NOT DETECTED NOT DETECTED Final  C difficile quick scan w PCR reflex     Status: None   Collection Time: 07/07/16 10:59 AM  Result Value Ref Range Status   C Diff antigen NEGATIVE NEGATIVE Final   C Diff toxin NEGATIVE NEGATIVE Final   C Diff interpretation No C. difficile detected.  Final  Respiratory Panel by PCR     Status: None   Collection Time: 07/07/16  3:40 PM  Result Value Ref Range Status   Adenovirus NOT DETECTED NOT DETECTED Final   Coronavirus 229E NOT DETECTED NOT DETECTED Final   Coronavirus HKU1 NOT DETECTED NOT DETECTED Final   Coronavirus NL63 NOT DETECTED NOT DETECTED Final   Coronavirus OC43 NOT DETECTED NOT DETECTED Final   Metapneumovirus NOT DETECTED NOT DETECTED Final   Rhinovirus / Enterovirus NOT DETECTED NOT DETECTED Final   Influenza A NOT DETECTED NOT DETECTED Final   Influenza B NOT DETECTED NOT DETECTED Final   Parainfluenza Virus 1 NOT DETECTED NOT DETECTED Final   Parainfluenza Virus 2 NOT DETECTED NOT DETECTED Final   Parainfluenza Virus 3 NOT DETECTED NOT DETECTED Final   Parainfluenza Virus 4 NOT DETECTED NOT DETECTED Final   Respiratory Syncytial Virus NOT DETECTED NOT DETECTED Final   Bordetella pertussis NOT DETECTED NOT DETECTED Final   Chlamydophila pneumoniae NOT DETECTED NOT DETECTED Final   Mycoplasma pneumoniae NOT DETECTED NOT DETECTED Final    Comment: Performed at [facility], [place] [address], [place], [place] [address]  MRSA PCR Screening     Status: Abnormal  Collection Time: 07/07/16  4:06 PM  Result Value Ref Range Status   MRSA by PCR POSITIVE (A) NEGATIVE Final    Comment:        The GeneXpert MRSA Assay (FDA approved for NASAL specimens only), is  one component of a comprehensive MRSA colonization surveillance program. It is not intended to diagnose MRSA infection nor to guide or monitor treatment for MRSA infections. RESULT CALLED TO, READ BACK BY AND VERIFIED WITH: A.HEAVNER,RN 2006 07/07/16 W.SHEA      Labs: BNP (last 3 results)  Recent Labs  07/07/16 0844  BNP 686.1*   Basic Metabolic Panel:  Recent Labs Lab 07/07/16 0844 07/07/16 1310 07/08/16 0829 07/09/16 0535  NA 133*  --  138 139  K 3.3*  --  3.6 3.8  CL 99*  --  108 107  CO2 25  --  24 26  GLUCOSE 112*  --  156* 114*  BUN 10  --  13 17  CREATININE 0.53 0.56 0.48 0.58  CALCIUM 8.8*  --  8.9 8.9   Liver Function Tests:  Recent Labs Lab 07/07/16 0844  AST 16  ALT 14  ALKPHOS 67  BILITOT 1.0  PROT 6.6  ALBUMIN 3.5   No results for input(s): LIPASE, AMYLASE in the last 168 hours. No results for input(s): AMMONIA in the last 168 hours. CBC:  Recent Labs Lab 07/07/16 0844 07/07/16 1310 07/09/16 0535  WBC 16.9* 14.5* 13.1*  NEUTROABS 14.2*  --   --   HGB 13.9 12.0 11.5*  HCT 40.7 36.3 34.6*  MCV 94.7 96.0 95.6  PLT 187 153 189   Cardiac Enzymes: No results for input(s): CKTOTAL, CKMB, CKMBINDEX, TROPONINI in the last 168 hours. BNP: Invalid input(s): POCBNP CBG: No results for input(s): GLUCAP in the last 168 hours. D-Dimer No results for input(s): DDIMER in the last 72 hours. Hgb A1c No results for input(s): HGBA1C in the last 72 hours. Lipid Profile No results for input(s): CHOL, HDL, LDLCALC, TRIG, CHOLHDL, LDLDIRECT in the last 72 hours. Thyroid function studies No results for input(s): TSH, T4TOTAL, T3FREE, THYROIDAB in the last 72 hours.  Invalid input(s): FREET3 Anemia work up No results for input(s): VITAMINB12, FOLATE, FERRITIN, TIBC, IRON, RETICCTPCT in the last 72 hours. Urinalysis    Component Value Date/Time   COLORURINE YELLOW 07/10/2016 1107   APPEARANCEUR CLEAR 07/10/2016 1107   LABSPEC 1.013 07/10/2016  1107   PHURINE 5.0 07/10/2016 1107   GLUCOSEU NEGATIVE 07/10/2016 1107   HGBUR NEGATIVE 07/10/2016 1107   BILIRUBINUR NEGATIVE 07/10/2016 1107   BILIRUBINUR n 09/25/2010 1525   KETONESUR NEGATIVE 07/10/2016 1107   PROTEINUR NEGATIVE 07/10/2016 1107   UROBILINOGEN 0.2 09/25/2010 1525   UROBILINOGEN 0.2 01/04/2007 0900   NITRITE NEGATIVE 07/10/2016 1107   LEUKOCYTESUR TRACE (A) 07/10/2016 1107   Sepsis Labs Invalid input(s): PROCALCITONIN,  WBC,  LACTICIDVEN Microbiology Recent Results (from the past 240 hour(s))  Culture, blood (Routine x 2)     Status: None (Preliminary result)   Collection Time: 07/07/16  8:44 AM  Result Value Ref Range Status   Specimen Description BLOOD RIGHT ANTECUBITAL  Final   Special Requests   Final    BOTTLES DRAWN AEROBIC AND ANAEROBIC Blood Culture adequate volume   Culture   Final    NO GROWTH 4 DAYS Performed at [facility], [place] [address], [place], [place] [address]    Report Status PENDING  Incomplete  Culture, blood (Routine x 2)  Status: None (Preliminary result)   Collection Time: 07/07/16  8:44 AM  Result Value Ref Range Status   Specimen Description BLOOD RIGHT HAND  Final   Special Requests   Final    BOTTLES DRAWN AEROBIC AND ANAEROBIC Blood Culture adequate volume   Culture   Final    NO GROWTH 4 DAYS Performed at [facility], [address] [place], [place] [address]    Report Status PENDING  Incomplete  Gastrointestinal Panel by PCR , Stool     Status: None   Collection Time: 07/07/16 10:59 AM  Result Value Ref Range Status   Campylobacter species NOT DETECTED NOT DETECTED Final   Plesimonas shigelloides NOT DETECTED NOT DETECTED Final   Salmonella species NOT DETECTED NOT DETECTED Final   Yersinia enterocolitica NOT DETECTED NOT DETECTED Final   Vibrio species NOT DETECTED NOT DETECTED Final   Vibrio cholerae NOT DETECTED NOT DETECTED Final   Enteroaggregative E coli (EAEC) NOT DETECTED NOT DETECTED Final    Enteropathogenic E coli (EPEC) NOT DETECTED NOT DETECTED Final   Enterotoxigenic E coli (ETEC) NOT DETECTED NOT DETECTED Final   Shiga like toxin producing E coli (STEC) NOT DETECTED NOT DETECTED Final   Shigella/Enteroinvasive E coli (EIEC) NOT DETECTED NOT DETECTED Final   Cryptosporidium NOT DETECTED NOT DETECTED Final   Cyclospora cayetanensis NOT DETECTED NOT DETECTED Final   Entamoeba histolytica NOT DETECTED NOT DETECTED Final   Giardia lamblia NOT DETECTED NOT DETECTED Final   Adenovirus F40/41 NOT DETECTED NOT DETECTED Final   Astrovirus NOT DETECTED NOT DETECTED Final   Norovirus GI/GII NOT DETECTED NOT DETECTED Final   Rotavirus A NOT DETECTED NOT DETECTED Final   Sapovirus (I, II, IV, and V) NOT DETECTED NOT DETECTED Final  C difficile quick scan w PCR reflex     Status: None   Collection Time: 07/07/16 10:59 AM  Result Value Ref Range Status   C Diff antigen NEGATIVE NEGATIVE Final   C Diff toxin NEGATIVE NEGATIVE Final   C Diff interpretation No C. difficile detected.  Final  Respiratory Panel by PCR     Status: None   Collection Time: 07/07/16  3:40 PM  Result Value Ref Range Status   Adenovirus NOT DETECTED NOT DETECTED Final   Coronavirus 229E NOT DETECTED NOT DETECTED Final   Coronavirus HKU1 NOT DETECTED NOT DETECTED Final   Coronavirus NL63 NOT DETECTED NOT DETECTED Final   Coronavirus OC43 NOT DETECTED NOT DETECTED Final   Metapneumovirus NOT DETECTED NOT DETECTED Final   Rhinovirus / Enterovirus NOT DETECTED NOT DETECTED Final   Influenza A NOT DETECTED NOT DETECTED Final   Influenza B NOT DETECTED NOT DETECTED Final   Parainfluenza Virus 1 NOT DETECTED NOT DETECTED Final   Parainfluenza Virus 2 NOT DETECTED NOT DETECTED Final   Parainfluenza Virus 3 NOT DETECTED NOT DETECTED Final   Parainfluenza Virus 4 NOT DETECTED NOT DETECTED Final   Respiratory Syncytial Virus NOT DETECTED NOT DETECTED Final   Bordetella pertussis NOT DETECTED NOT DETECTED Final    Chlamydophila pneumoniae NOT DETECTED NOT DETECTED Final   Mycoplasma pneumoniae NOT DETECTED NOT DETECTED Final    Comment: Performed at [facility], [place] [address] [place], [place] [address]  MRSA PCR Screening     Status: Abnormal   Collection Time: 07/07/16  4:06 PM  Result Value Ref Range Status   MRSA by PCR POSITIVE (A) NEGATIVE Final    Comment:  The GeneXpert MRSA Assay (FDA approved for NASAL specimens only), is one component of a comprehensive MRSA colonization surveillance program. It is not intended to diagnose MRSA infection nor to guide or monitor treatment for MRSA infections. RESULT CALLED TO, READ BACK BY AND VERIFIED WITH: A.HEAVNER,RN 2006 07/07/16 W.SHEA      Time coordinating discharge: Over 30 minutes  SIGNED:   Elmarie Shiley, MD  [facility] 07/11/2016, 11:09 AM Pager [phone]  If 7PM-7AM, please contact night-coverage [email or web address] Password [email or web address]

## 2016-07-12 LAB — CULTURE, BLOOD (ROUTINE X 2)
Culture: NO GROWTH
Culture: NO GROWTH
SPECIAL REQUESTS: ADEQUATE
Special Requests: ADEQUATE

## 2016-07-16 ENCOUNTER — Institutional Professional Consult (permissible substitution): Payer: Medicare Other | Admitting: Pulmonary Disease

## 2016-08-09 ENCOUNTER — Emergency Department (HOSPITAL_COMMUNITY)
Admit: 2016-08-09 | Discharge: 2016-08-09 | Disposition: A | Discharge status: Home / Self Care | Payer: Medicare Other | Attending: Emergency Medicine | Admitting: Emergency Medicine

## 2016-08-09 ENCOUNTER — Encounter (HOSPITAL_COMMUNITY): Payer: Self-pay | Admitting: Emergency Medicine

## 2016-08-09 ENCOUNTER — Inpatient Hospital Stay (HOSPITAL_COMMUNITY)
Admission: EM | Admit: 2016-08-09 | Discharge: 2016-08-12 | DRG: 193 | Disposition: A | Discharge status: Skilled Nursing Facility | Payer: Medicare Other | Attending: Internal Medicine | Admitting: Internal Medicine

## 2016-08-09 ENCOUNTER — Emergency Department (HOSPITAL_COMMUNITY): Payer: Medicare Other

## 2016-08-09 DIAGNOSIS — Z993 Dependence on wheelchair: Secondary | ICD-10-CM

## 2016-08-09 DIAGNOSIS — T84099A Other mechanical complication of unspecified internal joint prosthesis, initial encounter: Secondary | ICD-10-CM | POA: Diagnosis present

## 2016-08-09 DIAGNOSIS — Z87891 Personal history of nicotine dependence: Secondary | ICD-10-CM

## 2016-08-09 DIAGNOSIS — Z8262 Family history of osteoporosis: Secondary | ICD-10-CM | POA: Diagnosis not present

## 2016-08-09 DIAGNOSIS — Y792 Prosthetic and other implants, materials and accessory orthopedic devices associated with adverse incidents: Secondary | ICD-10-CM | POA: Diagnosis present

## 2016-08-09 DIAGNOSIS — J189 Pneumonia, unspecified organism: Principal | ICD-10-CM | POA: Diagnosis present

## 2016-08-09 DIAGNOSIS — Z833 Family history of diabetes mellitus: Secondary | ICD-10-CM

## 2016-08-09 DIAGNOSIS — R4182 Altered mental status, unspecified: Secondary | ICD-10-CM | POA: Diagnosis not present

## 2016-08-09 DIAGNOSIS — T148XXD Other injury of unspecified body region, subsequent encounter: Secondary | ICD-10-CM | POA: Diagnosis present

## 2016-08-09 DIAGNOSIS — Z86010 Personal history of colonic polyps: Secondary | ICD-10-CM

## 2016-08-09 DIAGNOSIS — L97919 Non-pressure chronic ulcer of unspecified part of right lower leg with unspecified severity: Secondary | ICD-10-CM | POA: Diagnosis present

## 2016-08-09 DIAGNOSIS — Z96653 Presence of artificial knee joint, bilateral: Secondary | ICD-10-CM | POA: Diagnosis present

## 2016-08-09 DIAGNOSIS — I360 Nonrheumatic tricuspid (valve) stenosis: Secondary | ICD-10-CM | POA: Diagnosis not present

## 2016-08-09 DIAGNOSIS — I10 Essential (primary) hypertension: Secondary | ICD-10-CM | POA: Diagnosis present

## 2016-08-09 DIAGNOSIS — E11622 Type 2 diabetes mellitus with other skin ulcer: Secondary | ICD-10-CM | POA: Diagnosis present

## 2016-08-09 DIAGNOSIS — Z8261 Family history of arthritis: Secondary | ICD-10-CM

## 2016-08-09 DIAGNOSIS — Z7982 Long term (current) use of aspirin: Secondary | ICD-10-CM

## 2016-08-09 DIAGNOSIS — M7989 Other specified soft tissue disorders: Secondary | ICD-10-CM

## 2016-08-09 DIAGNOSIS — Z9049 Acquired absence of other specified parts of digestive tract: Secondary | ICD-10-CM | POA: Diagnosis not present

## 2016-08-09 DIAGNOSIS — Z8249 Family history of ischemic heart disease and other diseases of the circulatory system: Secondary | ICD-10-CM | POA: Diagnosis not present

## 2016-08-09 DIAGNOSIS — M8580 Other specified disorders of bone density and structure, unspecified site: Secondary | ICD-10-CM | POA: Diagnosis present

## 2016-08-09 DIAGNOSIS — C50911 Malignant neoplasm of unspecified site of right female breast: Secondary | ICD-10-CM | POA: Diagnosis present

## 2016-08-09 DIAGNOSIS — K5730 Diverticulosis of large intestine without perforation or abscess without bleeding: Secondary | ICD-10-CM | POA: Diagnosis present

## 2016-08-09 DIAGNOSIS — Z800 Family history of malignant neoplasm of digestive organs: Secondary | ICD-10-CM | POA: Diagnosis not present

## 2016-08-09 DIAGNOSIS — T84018A Broken internal joint prosthesis, other site, initial encounter: Secondary | ICD-10-CM

## 2016-08-09 DIAGNOSIS — J9601 Acute respiratory failure with hypoxia: Secondary | ICD-10-CM | POA: Diagnosis present

## 2016-08-09 DIAGNOSIS — Y95 Nosocomial condition: Secondary | ICD-10-CM | POA: Diagnosis present

## 2016-08-09 DIAGNOSIS — Z8349 Family history of other endocrine, nutritional and metabolic diseases: Secondary | ICD-10-CM

## 2016-08-09 DIAGNOSIS — Z853 Personal history of malignant neoplasm of breast: Secondary | ICD-10-CM | POA: Diagnosis not present

## 2016-08-09 DIAGNOSIS — Z7951 Long term (current) use of inhaled steroids: Secondary | ICD-10-CM

## 2016-08-09 DIAGNOSIS — Z90710 Acquired absence of both cervix and uterus: Secondary | ICD-10-CM

## 2016-08-09 DIAGNOSIS — E559 Vitamin D deficiency, unspecified: Secondary | ICD-10-CM | POA: Diagnosis present

## 2016-08-09 DIAGNOSIS — Z96649 Presence of unspecified artificial hip joint: Secondary | ICD-10-CM

## 2016-08-09 DIAGNOSIS — G4700 Insomnia, unspecified: Secondary | ICD-10-CM

## 2016-08-09 DIAGNOSIS — R531 Weakness: Secondary | ICD-10-CM

## 2016-08-09 DIAGNOSIS — R0600 Dyspnea, unspecified: Secondary | ICD-10-CM

## 2016-08-09 DIAGNOSIS — J9621 Acute and chronic respiratory failure with hypoxia: Secondary | ICD-10-CM | POA: Diagnosis present

## 2016-08-09 DIAGNOSIS — R1310 Dysphagia, unspecified: Secondary | ICD-10-CM

## 2016-08-09 DIAGNOSIS — Z885 Allergy status to narcotic agent status: Secondary | ICD-10-CM

## 2016-08-09 DIAGNOSIS — Z981 Arthrodesis status: Secondary | ICD-10-CM

## 2016-08-09 DIAGNOSIS — J9600 Acute respiratory failure, unspecified whether with hypoxia or hypercapnia: Secondary | ICD-10-CM | POA: Diagnosis present

## 2016-08-09 DIAGNOSIS — M1990 Unspecified osteoarthritis, unspecified site: Secondary | ICD-10-CM | POA: Diagnosis present

## 2016-08-09 DIAGNOSIS — Z96641 Presence of right artificial hip joint: Secondary | ICD-10-CM | POA: Diagnosis present

## 2016-08-09 DIAGNOSIS — Z882 Allergy status to sulfonamides status: Secondary | ICD-10-CM

## 2016-08-09 LAB — COMPREHENSIVE METABOLIC PANEL
ALBUMIN: 3.9 g/dL (ref 3.5–5.0)
ALK PHOS: 73 U/L (ref 38–126)
ALT: 17 U/L (ref 14–54)
ANION GAP: 9 (ref 5–15)
AST: 22 U/L (ref 15–41)
BUN: 19 mg/dL (ref 6–20)
CALCIUM: 9 mg/dL (ref 8.9–10.3)
CO2: 25 mmol/L (ref 22–32)
Chloride: 105 mmol/L (ref 101–111)
Creatinine, Ser: 0.66 mg/dL (ref 0.44–1.00)
GFR calc Af Amer: >60 mL/min (ref >60)
GFR calc non Af Amer: >60 mL/min (ref >60)
GLUCOSE: 121 mg/dL — AB (ref 65–99)
Potassium: 4.1 mmol/L (ref 3.5–5.1)
SODIUM: 139 mmol/L (ref 135–145)
Total Bilirubin: 0.5 mg/dL (ref 0.3–1.2)
Total Protein: 6.9 g/dL (ref 6.5–8.1)

## 2016-08-09 LAB — URINALYSIS, ROUTINE W REFLEX MICROSCOPIC
Bacteria, UA: NONE SEEN
Bilirubin Urine: NEGATIVE
GLUCOSE, UA: NEGATIVE mg/dL
Hgb urine dipstick: NEGATIVE
Ketones, ur: NEGATIVE mg/dL
Leukocytes, UA: NEGATIVE
Nitrite: NEGATIVE
PROTEIN: 30 mg/dL — AB
SQUAMOUS EPITHELIAL / LPF: NONE SEEN
Specific Gravity, Urine: 1.021 (ref 1.005–1.030)
pH: 6 (ref 5.0–8.0)

## 2016-08-09 LAB — I-STAT CG4 LACTIC ACID, ED
LACTIC ACID, VENOUS: 0.61 mmol/L (ref 0.5–1.9)
LACTIC ACID, VENOUS: 0.5 mmol/L (ref 0.5–1.9)

## 2016-08-09 LAB — PROTIME-INR
INR: 1.07
Prothrombin Time: 13.9 seconds (ref 11.4–15.2)

## 2016-08-09 LAB — CBC
HEMATOCRIT: 39.1 % (ref 36.0–46.0)
Hemoglobin: 13 g/dL (ref 12.0–15.0)
MCH: 32.1 pg (ref 26.0–34.0)
MCHC: 33.2 g/dL (ref 30.0–36.0)
MCV: 96.5 fL (ref 78.0–100.0)
Platelets: 198 10*3/uL (ref 150–400)
RBC: 4.05 MIL/uL (ref 3.87–5.11)
RDW: 14.3 % (ref 11.5–15.5)
WBC: 12.3 10*3/uL — ABNORMAL HIGH (ref 4.0–10.5)

## 2016-08-09 LAB — TROPONIN I: Troponin I: <0.03 ng/mL (ref <0.03)

## 2016-08-09 LAB — TSH: TSH: 0.791 u[IU]/mL (ref 0.350–4.500)

## 2016-08-09 LAB — D-DIMER, QUANTITATIVE (NOT AT ARMC): D DIMER QUANT: 0.47 ug{FEU}/mL (ref 0.00–0.50)

## 2016-08-09 LAB — MRSA PCR SCREENING: MRSA BY PCR: NEGATIVE

## 2016-08-09 MED ORDER — ALBUTEROL SULFATE (2.5 MG/3ML) 0.083% IN NEBU: 2.5000 mg | INHALATION_SOLUTION | RESPIRATORY_TRACT | Status: DC | PRN

## 2016-08-09 MED ORDER — FUROSEMIDE 10 MG/ML IJ SOLN
40.0000 mg | Freq: Four times a day (QID) | INTRAMUSCULAR | Status: AC
  Administered 2016-08-09 (×2): 40 mg via INTRAVENOUS
  Filled 2016-08-09 (×2): qty 4

## 2016-08-09 MED ORDER — IPRATROPIUM-ALBUTEROL 0.5-2.5 (3) MG/3ML IN SOLN
3.0000 mL | RESPIRATORY_TRACT | Status: DC
  Administered 2016-08-09: 3 mL via RESPIRATORY_TRACT

## 2016-08-09 MED ORDER — ARNICARE ARNICA EX CREA: 1.0000 "application " | TOPICAL_CREAM | CUTANEOUS | Status: DC | PRN

## 2016-08-09 MED ORDER — POLYVINYL ALCOHOL 1.4 % OP SOLN
1.0000 [drp] | Freq: Two times a day (BID) | OPHTHALMIC | Status: DC
  Administered 2016-08-10 – 2016-08-12 (×4): 1 [drp] via OPHTHALMIC
  Filled 2016-08-09 (×2): qty 15

## 2016-08-09 MED ORDER — FLEET ENEMA 7-19 GM/118ML RE ENEM
1.0000 | ENEMA | Freq: Once | RECTAL | Status: DC | PRN
Start: 2016-08-09 — End: 2016-08-12

## 2016-08-09 MED ORDER — VANCOMYCIN HCL IN DEXTROSE 750-5 MG/150ML-% IV SOLN
750.0000 mg | Freq: Two times a day (BID) | INTRAVENOUS | Status: DC
  Administered 2016-08-09 – 2016-08-10 (×2): 750 mg via INTRAVENOUS
  Filled 2016-08-09 (×2): qty 150

## 2016-08-09 MED ORDER — CLONAZEPAM 1 MG PO TABS
1.0000 mg | ORAL_TABLET | Freq: Every evening | ORAL | Status: DC | PRN
  Administered 2016-08-09 – 2016-08-11 (×3): 1 mg via ORAL
  Filled 2016-08-09 (×3): qty 1

## 2016-08-09 MED ORDER — LIP MEDEX EX OINT
TOPICAL_OINTMENT | CUTANEOUS | Status: DC | PRN
  Filled 2016-08-09: qty 7

## 2016-08-09 MED ORDER — BUPROPION HCL ER (XL) 300 MG PO TB24
300.0000 mg | ORAL_TABLET | Freq: Every day | ORAL | Status: DC
  Administered 2016-08-09 – 2016-08-12 (×4): 300 mg via ORAL
  Filled 2016-08-09 (×3): qty 1

## 2016-08-09 MED ORDER — GUAIFENESIN ER 600 MG PO TB12
1200.0000 mg | ORAL_TABLET | Freq: Two times a day (BID) | ORAL | Status: DC | PRN
  Administered 2016-08-09 – 2016-08-12 (×3): 1200 mg via ORAL
  Filled 2016-08-09 (×3): qty 2

## 2016-08-09 MED ORDER — ONDANSETRON HCL 4 MG/2ML IJ SOLN: 4.0000 mg | Freq: Four times a day (QID) | INTRAMUSCULAR | Status: DC | PRN

## 2016-08-09 MED ORDER — SODIUM CHLORIDE 0.9% FLUSH
3.0000 mL | Freq: Two times a day (BID) | INTRAVENOUS | Status: DC
  Administered 2016-08-09 – 2016-08-12 (×3): 3 mL via INTRAVENOUS

## 2016-08-09 MED ORDER — ACETAMINOPHEN 650 MG RE SUPP: 650.0000 mg | Freq: Four times a day (QID) | RECTAL | Status: DC | PRN

## 2016-08-09 MED ORDER — ESCITALOPRAM OXALATE 10 MG PO TABS
10.0000 mg | ORAL_TABLET | Freq: Every day | ORAL | Status: DC
  Administered 2016-08-09 – 2016-08-12 (×4): 10 mg via ORAL
  Filled 2016-08-09 (×4): qty 1

## 2016-08-09 MED ORDER — GUAIFENESIN-DM 100-10 MG/5ML PO SYRP: 5.0000 mL | ORAL_SOLUTION | ORAL | Status: DC | PRN

## 2016-08-09 MED ORDER — IOPAMIDOL (ISOVUE-370) INJECTION 76%
INTRAVENOUS | Status: AC
  Filled 2016-08-09: qty 100

## 2016-08-09 MED ORDER — GABAPENTIN 300 MG PO CAPS
300.0000 mg | ORAL_CAPSULE | Freq: Three times a day (TID) | ORAL | Status: DC
  Administered 2016-08-09 – 2016-08-12 (×9): 300 mg via ORAL
  Filled 2016-08-09 (×9): qty 1

## 2016-08-09 MED ORDER — DEXTROSE 5 % IV SOLN
1.0000 g | Freq: Once | INTRAVENOUS | Status: AC
  Administered 2016-08-09: 1 g via INTRAVENOUS
  Filled 2016-08-09: qty 1

## 2016-08-09 MED ORDER — MONTELUKAST SODIUM 10 MG PO TABS
10.0000 mg | ORAL_TABLET | Freq: Every day | ORAL | Status: DC
  Administered 2016-08-09 – 2016-08-11 (×3): 10 mg via ORAL
  Filled 2016-08-09 (×3): qty 1

## 2016-08-09 MED ORDER — HYDROCODONE-ACETAMINOPHEN 7.5-325 MG PO TABS
1.0000 | ORAL_TABLET | Freq: Two times a day (BID) | ORAL | Status: DC | PRN
  Administered 2016-08-09 – 2016-08-10 (×3): 1 via ORAL
  Filled 2016-08-09 (×4): qty 1

## 2016-08-09 MED ORDER — DOCUSATE SODIUM 100 MG PO CAPS
100.0000 mg | ORAL_CAPSULE | Freq: Every day | ORAL | Status: DC
  Administered 2016-08-09 – 2016-08-12 (×3): 100 mg via ORAL
  Filled 2016-08-09 (×3): qty 1

## 2016-08-09 MED ORDER — CALCIUM CARBONATE-VITAMIN D 500-200 MG-UNIT PO TABS
1.0000 | ORAL_TABLET | Freq: Every day | ORAL | Status: DC
  Administered 2016-08-09: 1 via ORAL
  Filled 2016-08-09: qty 1

## 2016-08-09 MED ORDER — IOPAMIDOL (ISOVUE-370) INJECTION 76%
100.0000 mL | Freq: Once | INTRAVENOUS | Status: AC | PRN
  Administered 2016-08-09: 100 mL via INTRAVENOUS

## 2016-08-09 MED ORDER — ACETAMINOPHEN 325 MG PO TABS
650.0000 mg | ORAL_TABLET | Freq: Four times a day (QID) | ORAL | Status: DC | PRN
  Administered 2016-08-11: 650 mg via ORAL
  Filled 2016-08-09: qty 2

## 2016-08-09 MED ORDER — MIRABEGRON ER 25 MG PO TB24
50.0000 mg | ORAL_TABLET | Freq: Every day | ORAL | Status: DC
  Administered 2016-08-09 – 2016-08-12 (×4): 50 mg via ORAL
  Filled 2016-08-09 (×4): qty 2

## 2016-08-09 MED ORDER — ALBUTEROL SULFATE (2.5 MG/3ML) 0.083% IN NEBU: 3.0000 mL | INHALATION_SOLUTION | RESPIRATORY_TRACT | Status: DC | PRN

## 2016-08-09 MED ORDER — VITAMIN D 1000 UNITS PO TABS
2000.0000 [IU] | ORAL_TABLET | Freq: Every day | ORAL | Status: DC
  Administered 2016-08-09 – 2016-08-12 (×4): 2000 [IU] via ORAL
  Filled 2016-08-09 (×3): qty 2

## 2016-08-09 MED ORDER — ONDANSETRON HCL 4 MG PO TABS: 4.0000 mg | ORAL_TABLET | Freq: Three times a day (TID) | ORAL | Status: DC | PRN

## 2016-08-09 MED ORDER — POLYETHYLENE GLYCOL 3350 17 G PO PACK: 17.0000 g | PACK | Freq: Every day | ORAL | Status: DC | PRN

## 2016-08-09 MED ORDER — ACETAMINOPHEN 325 MG PO TABS: 650.0000 mg | ORAL_TABLET | Freq: Four times a day (QID) | ORAL | Status: DC | PRN

## 2016-08-09 MED ORDER — IBUPROFEN 200 MG PO TABS: 400.0000 mg | ORAL_TABLET | Freq: Two times a day (BID) | ORAL | Status: DC | PRN

## 2016-08-09 MED ORDER — DEXTROSE 5 % IV SOLN
1.0000 g | Freq: Three times a day (TID) | INTRAVENOUS | Status: DC
  Administered 2016-08-09 – 2016-08-10 (×3): 1 g via INTRAVENOUS
  Filled 2016-08-09 (×3): qty 1

## 2016-08-09 MED ORDER — HEPARIN SODIUM (PORCINE) 5000 UNIT/ML IJ SOLN
5000.0000 [IU] | Freq: Three times a day (TID) | INTRAMUSCULAR | Status: DC
  Administered 2016-08-09 – 2016-08-12 (×8): 5000 [IU] via SUBCUTANEOUS
  Filled 2016-08-09 (×8): qty 1

## 2016-08-09 MED ORDER — ONDANSETRON HCL 4 MG PO TABS: 4.0000 mg | ORAL_TABLET | Freq: Four times a day (QID) | ORAL | Status: DC | PRN

## 2016-08-09 MED ORDER — GUAIFENESIN-DM 100-10 MG/5ML PO SYRP: 15.0000 mL | ORAL_SOLUTION | ORAL | Status: DC | PRN

## 2016-08-09 MED ORDER — CALCIUM CARBONATE ANTACID 500 MG PO CHEW: 1.0000 | CHEWABLE_TABLET | Freq: Once | ORAL | Status: DC

## 2016-08-09 MED ORDER — ASPIRIN EC 325 MG PO TBEC
325.0000 mg | DELAYED_RELEASE_TABLET | Freq: Every day | ORAL | Status: DC
  Administered 2016-08-09 – 2016-08-12 (×4): 325 mg via ORAL
  Filled 2016-08-09 (×3): qty 1

## 2016-08-09 MED ORDER — FLUTICASONE PROPIONATE 50 MCG/ACT NA SUSP
1.0000 | Freq: Two times a day (BID) | NASAL | Status: DC
  Administered 2016-08-09 – 2016-08-12 (×6): 1 via NASAL
  Filled 2016-08-09: qty 16

## 2016-08-09 MED ORDER — VANCOMYCIN HCL IN DEXTROSE 1-5 GM/200ML-% IV SOLN
1000.0000 mg | Freq: Once | INTRAVENOUS | Status: AC
  Administered 2016-08-09: 1000 mg via INTRAVENOUS
  Filled 2016-08-09: qty 200

## 2016-08-09 MED ORDER — IPRATROPIUM-ALBUTEROL 0.5-2.5 (3) MG/3ML IN SOLN
3.0000 mL | Freq: Four times a day (QID) | RESPIRATORY_TRACT | Status: DC
  Administered 2016-08-09 – 2016-08-10 (×2): 3 mL via RESPIRATORY_TRACT
  Filled 2016-08-09 (×2): qty 3

## 2016-08-09 MED ORDER — ALUM & MAG HYDROXIDE-SIMETH 200-200-20 MG/5ML PO SUSP
15.0000 mL | Freq: Once | ORAL | Status: AC
  Administered 2016-08-09: 15 mL via ORAL
  Filled 2016-08-09: qty 30

## 2016-08-09 MED ORDER — MENTHOL 3 MG MT LOZG: 1.0000 | LOZENGE | OROMUCOSAL | Status: DC | PRN

## 2016-08-09 NOTE — Evaluation (Signed)
Clinical/Bedside Swallow Evaluation Patient Details  Name: [name] MRN: [id number] Date of Birth: 1930/10/20  Today's Date: 08/09/2016 Time: SLP Start Time (ACUTE ONLY): 1545 SLP Stop Time (ACUTE ONLY): 1615 SLP Time Calculation (min) (ACUTE ONLY): 30 min  Past Medical History:  Past Medical History:  Diagnosis Date  . Arthritis   . Asthma   . Breast cancer ([place]) 2006   right w/lumpectomy  . COPD (chronic obstructive pulmonary disease) ([place])   . Depression   . Diabetes ([place]) 2007  . Diverticulosis of colon   . Endometrial hyperplasia 01/1996   fibroids, adenomyosis  . Frequent UTI   . History of colon polyps   . Hypertension   . Insomnia   . Osteopenia 12/08   hip  . Spinal stenosis 2001  . Vitamin D deficiency    Past Surgical History:  Past Surgical History:  Procedure Laterality Date  . BILATERAL SALPINGOOPHORECTOMY  12/97  . BREAST LUMPECTOMY Right 03/2003  . CARPAL TUNNEL RELEASE    . CERVICAL FUSION    . CERVICAL LAMINECTOMY  1975   x4  . CHOLECYSTECTOMY  11/00   lap chole  . HYSTEROSCOPY  10/97   D&C, postmenopausal bleeding  . LUMBAR LAMINECTOMY  10/06   with spinal stenosis  . manipulation of hip for dislocation    . ROTATOR CUFF REPAIR Right 11/96  . TONSILLECTOMY AND ADENOIDECTOMY    . TOTAL ABDOMINAL HYSTERECTOMY  12/97   endo hyperplasia, fibroids, adenomyosis  . TOTAL HIP ARTHROPLASTY Right 8/00  . TOTAL HIP REVISION Right 07/10/2015   Procedure: RIGHT ACETABULAR VS TOTAL HIP REVISION;  Surgeon: Gaynelle Arabian, MD;  Location: [facility];  Service: Orthopedics;  Laterality: Right;  . TOTAL KNEE ARTHROPLASTY Left 06/2003  . TOTAL KNEE ARTHROPLASTY Right 10/06  . TUBAL LIGATION Bilateral 1968   HPI:  Pt is an 83 yo female adm to [facility] with shortness of breath - diagnosed with pneumonia.  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  She also has PMH + for COPD, breast cancer, failed hip arthroplasty, pneumonia diagnosed in March  2018.  Pt admits to h/o dysphagia - pointing to esophagus to indicate sensation of stasis in esophagus- causing her to choke and at times expectorate food with copious secretions.  Son reports this has been ongoing for "years" but worsened recently  - causing pt to cough with every meal.  Pt denies weight loss due to her dysphagia.     Assessment / Plan / Recommendation Clinical Impression  Pt with negative CN exam regarding swallow function.  No immediate signs of aspiration with thin water, pudding nor graham cracker that pt masticated well.  Swallow was overally timely with clear voice.  She did demonstrate cough but did not appear coorelated directly to po intake.  Pt is xerostomic and thus advised her to begin meals with liquids.    Educated pt/son to findings/concerns for esophageal dysphagia symptoms.  Advised to aspiration/reflux precautions and compensations for eating with respiratory deficits.  Suspect pt's deconditioning and chronic medical issues increase her risk of aspiration pneumonias.    Pt admits to h/o dysphagia - pointing to mid-esophagus to indicate sensation of stasis in esophagus- causing her to choke and at times expectorate food with copious secretions.  Pt reports she was given Nexium but this did not aid in her coughing/dysphagia symptoms- thus she stopped taking it approximately 1.5 years ago.    Son reports this has been ongoing for "years" but  worsened recently  - causing pt to cough with every meal- with food more thn drink.  Pt denies weight loss due to her dysphagia.    Recommend continue diet as tolerated with strict aspiration precautions and consider dedicated esophageal evaluation.  As pt with h/o cervical fusion - ? If this may be contributing factor.  SLP Visit Diagnosis: Dysphagia, unspecified (R13.10)    Aspiration Risk  Moderate aspiration risk    Diet Recommendation Regular;Thin liquid   Liquid Administration via: Straw;Cup Medication Administration:  Whole meds with liquid Supervision: Staff to assist with self feeding Compensations: Slow rate;Small sips/bites;Other (Comment) (start meal with liquids) Postural Changes: Remain upright for at least 30 minutes after po intake;Seated upright at 90 degrees    Other  Recommendations Oral Care Recommendations: Oral care BID   Follow up Recommendations None      Frequency and Duration min 2x/week  1 week       Prognosis Prognosis for Safe Diet Advancement: Fair      Swallow Study   General Date of Onset: 08/09/16 HPI: Pt is an 83 yo female adm to [facility] with shortness of breath - diagnosed with pneumonia.  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  She also has PMH + for COPD, breast cancer, failed hip arthroplasty, pneumonia diagnosed in March 2018.  Pt admits to h/o dysphagia - pointing to esophagus to indicate sensation of stasis in esophagus- causing her to choke and at times expectorate food with copious secretions.  Son reports this has been ongoing for "years" but worsened recently  - causing pt to cough with every meal.  Pt denies weight loss due to her dysphagia.   Type of Study: Bedside Swallow Evaluation Diet Prior to this Study: Regular;Thin liquids Temperature Spikes Noted: No Respiratory Status: Nasal cannula History of Recent Intubation: No Behavior/Cognition: Alert;Cooperative;Pleasant mood Oral Cavity Assessment: Dry Oral Care Completed by SLP: No Oral Cavity - Dentition: Adequate natural dentition Vision: Functional for self-feeding Self-Feeding Abilities: Able to feed self Patient Positioning: Upright in bed Baseline Vocal Quality: Low vocal intensity Volitional Cough: Weak Volitional Swallow: Able to elicit    Oral/Motor/Sensory Function Overall Oral Motor/Sensory Function: Generalized oral weakness   Ice Chips Ice chips: Not tested   Thin Liquid Thin Liquid: Impaired Pharyngeal  Phase Impairments: Cough - Delayed    Nectar Thick  Nectar Thick Liquid: Not tested   Honey Thick Honey Thick Liquid: Not tested   Puree Puree: Within functional limits Presentation: Self Fed;Spoon   Solid   GO   Solid: Within functional limits Presentation: [place], [place], [place] [facility] SLP [phone]

## 2016-08-09 NOTE — ED Provider Notes (Signed)
  Physical Exam  BP 137/63   Pulse 99   Temp 98.6 F (37 C) (Oral)   Resp (!) 23   SpO2 97%   Physical Exam  ED Course  Procedures  MDM Care assumed at 7:30 am from Dr. Mariane Masters. Patient here with SOB, cough, chills. WBC 12. CXR showed possible pneumonia vs atelectasis. ? Hypoxia in triage. CT angio chest ordered and pending at sign out.   9:49 AM CT showed no PE. But there is bilateral ground glass opacities suggestive of pneumonia. Lactate nl. I tried to have staff ambulate patient. She became short of breath and pulse O2 dec to 86% on RA. Not on oxygen at home. Will admit for HCAP with hypoxia. She was recently admitted for HCAP so will give vanc/cefepime as per protocol.      Mariah Freeze, MD 08/09/16 [phone]

## 2016-08-09 NOTE — ED Triage Notes (Signed)
Pt comes from [facility] via EMS with complaints of generalized weakness. Facility and pt's husband stated "she is just not her self".  A&O x4. Pt unable to move per baseline. [place] noted in lower lobes. 2 L [place] on arrival due to desaturation. CBG 126. Vitals in route 142/80, RR 22, HR 101. Pt reports she has some trouble voiding and occasionally has incontinence.

## 2016-08-09 NOTE — ED Provider Notes (Signed)
[facility] DEPT Provider Note   CSN: [id number] Arrival date & time: 08/09/16  8250  By signing my name below, I, Levester Fresh, attest that this documentation has been prepared under the direction and in the presence of Varney Biles, MD . Electronically Signed: Levester Fresh, Scribe. 08/09/2016. 4:11 AM.  History   Chief Complaint Chief Complaint  Patient presents with  . Weakness   HPI Comments [name] is a 83 y.o. female with a PMHx significant for bronchial asthma, hypertension, prior history of breast cancer and chronic right leg ulcer, who presents to the Emergency Department with complaints of altered mental status, per care facility and pt's husband.  Here, she endorses generalized fatigue, some confusion and a headache.  No chest pain, abdominal pain or back pain.  No vomiting or diarrhea.  Pt with a hx of asthma, reports some shortness of breath and a productive cough with yellow sputum.  Subjective fever.  Pt denies experiencing any other acute sx. Pt was admitted in May with resp symptoms and she had gotten levaquin prior to that admission. Pt denies any falls. No new numbness, tingling or focal weakness.  [facility] [phone] [facility] - when I first saw the patient and again at 5:20 and no body is picking up.  The history is provided by the patient, medical records, the nursing home and a significant other. No language interpreter was used.    Past Medical History:  Diagnosis Date  . Arthritis   . Asthma   . Breast cancer ([place]) 2006   right w/lumpectomy  . COPD (chronic obstructive pulmonary disease) ([place])   . Depression   . Diabetes ([place]) 2007  . Diverticulosis of colon   . Endometrial hyperplasia 01/1996   fibroids, adenomyosis  . Frequent UTI   . History of colon polyps   . Hypertension   . Insomnia   . Osteopenia 12/08   hip  . Spinal stenosis 2001  . Vitamin D deficiency     Patient Active Problem List   Diagnosis  Date Noted  . HCAP (healthcare-associated pneumonia) 07/07/2016  . Chronic ulcer of right leg ([place]) 07/07/2016  . SIRS (systemic inflammatory response syndrome) ([place]) 05/08/2016  . Community acquired pneumonia 05/08/2016  . Failed total hip arthroplasty ([place]) 07/10/2015  . Breast cancer, right breast ([place]) 07/18/2014  . CONCUSSION WITH LOC OF UNSPECIFIED DURATION 07/19/2009  . OTH SYMPTOMS INVOLVING RESPIRATORY SYSTEM&CHEST 11/12/2008  . UNSPECIFIED DISORDER OF ANKLE AND FOOT JOINT 05/11/2008  . CERUMEN IMPACTION, BILATERAL 05/04/2008  . DYSMETABOLIC SYNDROME X 37/90/2409  . UNS ADVRS EFF UNS RX MEDICINAL&BIOLOGICAL SBSTNC 08/12/2007  . Other and unspecified hyperlipidemia 04/01/2007  . INSOMNIA, PERSISTENT 09/09/2006  . Depression 09/09/2006  . Essential hypertension 09/09/2006  . Asthma, chronic, unspecified asthma severity, with acute exacerbation 09/09/2006  . DIVERTICULOSIS, COLON 09/09/2006  . OSTEOARTHRITIS 09/09/2006  . STENOSIS, LUMBAR SPINE 09/09/2006  . COLONIC POLYPS, HX OF 09/09/2006    Past Surgical History:  Procedure Laterality Date  . BILATERAL SALPINGOOPHORECTOMY  12/97  . BREAST LUMPECTOMY Right 03/2003  . CARPAL TUNNEL RELEASE    . CERVICAL FUSION    . CERVICAL LAMINECTOMY  1975   x4  . CHOLECYSTECTOMY  11/00   lap chole  . HYSTEROSCOPY  10/97   D&C, postmenopausal bleeding  . LUMBAR LAMINECTOMY  10/06   with spinal stenosis  . manipulation of hip for dislocation    . ROTATOR CUFF REPAIR Right 11/96  .  TONSILLECTOMY AND ADENOIDECTOMY    . TOTAL ABDOMINAL HYSTERECTOMY  12/97   endo hyperplasia, fibroids, adenomyosis  . TOTAL HIP ARTHROPLASTY Right 8/00  . TOTAL HIP REVISION Right 07/10/2015   Procedure: RIGHT ACETABULAR VS TOTAL HIP REVISION;  Surgeon: Gaynelle Arabian, MD;  Location: [facility];  Service: Orthopedics;  Laterality: Right;  . TOTAL KNEE ARTHROPLASTY Left 06/2003  . TOTAL KNEE ARTHROPLASTY Right 10/06  . TUBAL LIGATION Bilateral 1968    OB  History    Gravida Para Term Preterm AB Living   3 3 3          SAB TAB Ectopic Multiple Live Births                   Home Medications    Prior to Admission medications   Medication Sig Start Date End Date Taking? Authorizing Provider  acetaminophen (TYLENOL) 500 MG tablet Take 500 mg by mouth every 8 (eight) hours as needed for moderate pain.   Yes [provider]  acetaminophen (TYLENOL) 650 MG CR tablet Take 650 mg by mouth every 12 (twelve) hours as needed for pain.   Yes [provider]  aspirin EC 325 MG tablet Take 325 mg by mouth daily.    Yes [provider]  buPROPion (WELLBUTRIN XL) 300 MG 24 hr tablet Take 300 mg by mouth every morning.    Yes [provider]  Calcium Carb-Cholecalciferol (CALCIUM+D3) 600-800 MG-UNIT TABS Take 1 tablet by mouth daily.   Yes [provider]  Cholecalciferol (VITAMIN D) 2000 units tablet Take 2,000 Units by mouth daily.   Yes [provider]  clonazePAM (KLONOPIN) 1 MG tablet Take 1 tablet (1 mg total) by mouth at bedtime as needed (insomnia). 05/10/16  Yes Mikhail, Velta Addison, DO  docusate sodium (COLACE) 100 MG capsule Take 100 mg by mouth daily.   Yes [provider]  escitalopram (LEXAPRO) 10 MG tablet Take 10 mg by mouth daily.  04/20/11  Yes [provider]  fluticasone (FLONASE) 50 MCG/ACT nasal spray Place 1 spray into both nostrils 2 (two) times daily.   Yes [provider]  gabapentin (NEURONTIN) 300 MG capsule Take 1 capsule (300 mg total) by mouth 3 (three) times daily. 11/14/15  Yes Magrinat, Virgie Dad, MD  HYDROcodone-acetaminophen (NORCO) 7.5-325 MG tablet Take 1 tablet by mouth 2 (two) times daily as needed for moderate pain or severe pain.   Yes [provider]  Ibuprofen (ADVIL) 200 MG CAPS Take 400 mg by mouth every 12 (twelve) hours as needed (pain).   Yes [provider]  mirabegron ER (MYRBETRIQ) 25 MG TB24 tablet Take 50 mg by mouth  daily.   Yes [provider]  montelukast (SINGULAIR) 10 MG tablet Take 10 mg by mouth at bedtime.     Yes [provider]  Propylene Glycol-Glycerin (SOOTHE) 0.6-0.6 % SOLN Apply 1 drop to eye 2 (two) times daily. 11/28/15  Yes Domenic Moras, PA-C  acetaminophen (TYLENOL) 325 MG tablet Take 2 tablets (650 mg total) by mouth every 6 (six) hours as needed for mild pain (or Fever >/= 101). 07/12/15   Perkins, Alexzandrew L, PA-C  albuterol (PROAIR HFA) 108 (90 BASE) MCG/ACT inhaler Inhale 2 puffs into the lungs every 4 (four) hours as needed for shortness of breath.     [provider]  albuterol (PROVENTIL) (2.5 MG/3ML) 0.083% nebulizer solution Take 2.5 mg by nebulization every 4 (four) hours as needed for wheezing or shortness of breath.  [provider]  amoxicillin-clavulanate (AUGMENTIN) 875-125 MG tablet Take 1 tablet by mouth every 12 (twelve) hours. Patient not taking: Reported on 08/09/2016 07/11/16   Regalado, Jerald Kief A, MD  benzonatate (TESSALON) 200 MG capsule Take 1 capsule (200 mg total) by mouth 3 (three) times daily as needed for cough. Patient not taking: Reported on 08/09/2016 07/11/16   Regalado, Jerald Kief A, MD  guaiFENesin (MUCINEX) 600 MG 12 hr tablet Take 600 mg by mouth 2 (two) times daily as needed for cough or to loosen phlegm.    [provider]  guaiFENesin-dextromethorphan (ROBITUSSIN DM) 100-10 MG/5ML syrup Take 15 mLs by mouth every 4 (four) hours as needed for cough.    [provider]  Homeopathic Products ([facility] [place]) CREA Apply 1 application topically as needed (muscle pain/stiffness).    [provider]  HYDROcodone-acetaminophen (NORCO/VICODIN) 5-325 MG tablet Take 1 tablet by mouth every 6 (six) hours as needed for moderate pain. Patient not taking: Reported on 08/09/2016 05/10/16   Cristal Ford, DO  ipratropium-albuterol (DUONEB) 0.5-2.5 (3) MG/3ML SOLN Take 3 mLs by nebulization 2 (two) times  daily. Patient not taking: Reported on 08/09/2016 07/11/16   Regalado, Jerald Kief A, MD  menthol-cetylpyridinium (CEPACOL) 3 MG lozenge Take 1 lozenge by mouth every 4 (four) hours as needed for sore throat.    [provider]  methocarbamol (ROBAXIN) 500 MG tablet Take 1 tablet (500 mg total) by mouth every 6 (six) hours as needed for muscle spasms. 07/12/15   Perkins, Alexzandrew L, PA-C  mupirocin ointment (BACTROBAN) 2 % Place 1 application into the nose 2 (two) times daily. Use through 05/14/2016 Patient not taking: Reported on 07/07/2016 05/10/16   Cristal Ford, DO  ondansetron (ZOFRAN) 4 MG tablet Take 4 mg by mouth every 8 (eight) hours as needed for nausea or vomiting.    [provider]  polyethylene glycol (MIRALAX / GLYCOLAX) packet Take 17 g by mouth daily as needed for mild constipation. 07/12/15   Perkins, Alexzandrew L, PA-C  predniSONE (DELTASONE) 20 MG tablet Take 2 tablets for 3 days then 1 tablet for 2 days Patient not taking: Reported on 08/09/2016 07/11/16   Regalado, Jerald Kief A, MD  sodium phosphate (FLEET) 7-19 GM/118ML ENEM Place 133 mLs (1 enema total) rectally once as needed for severe constipation. 07/12/15   Perkins, Alexzandrew L, PA-C    Family History Family History  Problem Relation Age of Onset  . Diabetes Father   . Colon cancer Father 88  . Heart failure Father   . Arthritis Mother   . Thyroid disease Mother   . Osteoporosis Mother   . Diabetes Brother   . Diabetes Brother   . Osteoporosis Maternal Aunt   . Osteoporosis Maternal Grandmother     Social History Social History  Substance Use Topics  . Smoking status: Former Smoker    Quit date: 04/24/1990  . Smokeless tobacco: Never Used  . Alcohol use 2.4 oz/week    4 Glasses of wine per week     Comment: occ     Allergies   Oxycodone hcl [oxycodone hcl]; Morphine sulfate; and Sulfamethoxazole   Review of Systems Review of Systems  Constitutional: Positive for fatigue and fever  (Subjective).  HENT: Positive for congestion.   Respiratory: Positive for cough and shortness of breath.   Cardiovascular: Negative for chest pain.  Gastrointestinal: Negative for abdominal pain, diarrhea and vomiting.  Musculoskeletal: Negative for back pain.  Neurological: Positive for headaches.  Psychiatric/Behavioral: Positive for confusion.  All other systems reviewed and are negative.  Physical Exam Updated Vital Signs BP 128/69   Pulse 81   Temp 98.6 F (37 C) (Oral)   Resp 20   SpO2 99%   Physical Exam  Constitutional: She is oriented to person, place, and time. She appears well-developed and well-nourished. No distress.  HENT:  Head: Normocephalic and atraumatic.  Eyes: EOM are normal. Pupils are equal, round, and reactive to light.  Neck: Normal range of motion.  Cardiovascular: Normal rate, regular rhythm and normal heart sounds.   Pulmonary/Chest: She has wheezes.  Fine wheezing appreciated over the lower lung fields.  Crackles appreciated on the right side over the lower and mid-line fields.  Abdominal: Soft. There is no tenderness.  Musculoskeletal: Normal range of motion. She exhibits edema and tenderness. She exhibits no deformity.  Bilateral lower extremity swelling L>R, with calf tenderness on the left side.  2+ dorsalis pedis pulses bilaterally. Bilateral distal leg erythema.   Neurological: She is alert and oriented to person, place, and time. No cranial nerve deficit.  Skin: Skin is warm and dry. There is erythema.  Psychiatric: She has a normal mood and affect.  Nursing note and vitals reviewed.  ED Treatments / Results  DIAGNOSTIC STUDIES: Oxygen Saturation is 99% on , adequate.   COORDINATION OF CARE: 3:30 AM Discussed treatment plan with pt at bedside and pt agreed to plan.  Labs (all labs ordered are listed, but only abnormal results are displayed) Labs Reviewed  COMPREHENSIVE METABOLIC PANEL - Abnormal; Notable for the following:        Result Value   Glucose, Bld 121 (*)    All other components within normal limits  CBC - Abnormal; Notable for the following:    WBC 12.3 (*)    All other components within normal limits  URINALYSIS, ROUTINE W REFLEX MICROSCOPIC - Abnormal; Notable for the following:    Protein, ur 30 (*)    All other components within normal limits  URINE CULTURE  TROPONIN I  D-DIMER, QUANTITATIVE (NOT AT [facility])    EKG  EKG Interpretation None       Radiology Dg Chest 2 View  Result Date: 08/09/2016 CLINICAL DATA:  Generalized weakness. Assess for pneumonia. History of RIGHT breast cancer, hypertension and asthma. EXAM: CHEST  2 VIEW COMPARISON:  Chest radiograph Jul 09, 2016 FINDINGS: Cardiomediastinal silhouette is normal. Mildly calcified aortic knob. No pleural effusions or focal consolidations. RIGHT lung base strandy densities. Trachea projects midline and there is no pneumothorax. Soft tissue planes and included osseous structures are non-suspicious. ACDF. IMPRESSION: RIGHT lung base atelectasis. Electronically Signed   By: Elon Alas M.D.   On: 08/09/2016 03:19    Procedures Procedures (including critical care time)  Medications Ordered in ED Medications - No data to display   Initial Impression / Assessment and Plan / ED Course  I have reviewed the triage vital signs and the nursing notes.  Pertinent labs & imaging results that were available during my care of the patient were reviewed by me and considered in my medical decision making (see chart for details).    I personally performed the services described in this documentation, which was scribed in my presence. The recorded information has been reviewed and is accurate.   DDx: Sepsis syndrome ACS syndrome DKA ICH / Stroke CHF exacerbation COPD exacerbation Infection - pneumonia/UTI/Cellulitis PE Dehydration Electrolyte abnormality Tox syndrome Drug overdose Metabolic disorders including thyroid disorders,  adrenal insufficiency Cancer of unknown origin /  paraneoplastic process  Pt comes in with weakness and confusion. Pt is ao x 3 for [place] and there is no focal neuro complains or deficits. Pt denies any fall. [facility] and there was no one who picked up the phone - so I am not getting any further history right now to support CT head. Pt is having LLE swelling - she has had recent admission to the hospital nd is not mobile - [place] ordered. PE is in the ddx as well - as there is wheezing on exam with the dyspnea. Dimer ordered for moderate PE risk factor and WELLS score of 4.5.  Infection also considered. UA is neg. Rectal temp shows no fevers.  Pt will go home after her PE workup nad DVT workup.   Final Clinical Impressions(s) / ED Diagnoses   Final diagnoses:  Weakness  Dyspnea, unspecified type  Leg swelling    New Prescriptions New Prescriptions   No medications on file     Varney Biles, MD 08/09/16 [phone]

## 2016-08-09 NOTE — ED Notes (Signed)
Bed: DG38 Expected date: 08/09/16 Expected time: 12:38 AM Means of arrival:  Comments: 83 year old fever, malaise, and altered. Patient was put on oxygen due to O2 sat 89%

## 2016-08-09 NOTE — Progress Notes (Signed)
Pharmacy Antibiotic Follow-up Note  [name] is a 83 y.o. year-old female admitted on 08/09/2016.  The patient is currently on day 1 of Vancomycin & Cefepime for HCAP.  Assessment/Plan: Vancomycin 1gm x1, then 750mg   IV every 12 hours.  Goal trough 15-20 mcg/mL.  Cefepime 1gm q8  Temp (24hrs), Avg:98.6 F (37 C), Min:98.5 F (36.9 C), Max:98.8 F (37.1 C)   Recent Labs Lab 08/09/16 0114  WBC 12.3*    Recent Labs Lab 08/09/16 0114  CREATININE 0.66   Estimated Creatinine Clearance: 59.7 mL/min (by C-G formula based on SCr of 0.66 mg/dL).    Allergies  Allergen Reactions  . Oxycodone Hcl [Oxycodone Hcl] Rash  . Morphine Sulfate Other (See Comments)    REACTION: very anxious  . Sulfamethoxazole Other (See Comments)    REACTION: unspecified   Antimicrobials this admission: 6/24 Vancomycin >>  6/24 Cefepime >>   Levels/dose changes this admission:  Microbiology results: 6/24 BCx: sent 6/24 UCx: sent   Thank you for allowing pharmacy to be a part of this patient's care.  Minda Ditto PharmD 08/09/2016 12:01 PM

## 2016-08-09 NOTE — ED Notes (Signed)
Patient transported to CT 

## 2016-08-09 NOTE — H&P (Signed)
History and Physical    [name] EUM:[id number] DOB: 11/05/1930 DOA: 08/09/2016  PCP: Reynold Bowen, MD  Patient coming from: [facility]  Chief Complaint: SOB  HPI: [name] is a 83 y.o. female with medical history significant of COPD, history of breast cancer, cervical spinal stenosis. She came to the hospital because of cough and fever. Patient admitted twice in the past 6 months in March and May for pneumonia. Given this time with the same complaints, shortness of breath, cough with minimal sputum production, subjective fevers and feeling very weak. She denies palpitations, denies hemoptysis. Initial evaluation in the ED with CXR was negative so CT angiography was obtained and showed bibasilar pneumonia. Patient desats of 87% on room air.  She is a retired Investment banker, operational, she is wheelchair-bound secondary to failed hip arthroplasty, lives in the [place] with her husband, when I interviewed her son was at bedside.  ED Course:  Vitals: Hypoxia with O2 sats of 87% on room air. Labs: WNL except WBC of 12.3. Imaging: CT scan showed bibasilar nodules suggesting acute infection, no PE. Interventions:  Review of Systems:  Constitutional: negative for anorexia, fevers and sweats Eyes: negative for irritation, redness and visual disturbance Ears, nose, mouth, throat, and face: negative for earaches, epistaxis, nasal congestion and sore throat Respiratory: negative for cough, dyspnea on exertion, sputum and wheezing Cardiovascular: negative for chest pain, dyspnea, lower extremity edema, orthopnea, palpitations and syncope Gastrointestinal: negative for abdominal pain, constipation, diarrhea, melena, nausea and vomiting Genitourinary:negative for dysuria, frequency and hematuria Hematologic/lymphatic: negative for bleeding, easy bruising and lymphadenopathy Musculoskeletal:negative for arthralgias, muscle weakness and stiff joints Neurological: negative  for coordination problems, gait problems, headaches and weakness Endocrine: negative for diabetic symptoms including polydipsia, polyuria and weight loss Allergic/Immunologic: negative for anaphylaxis, hay fever and urticaria  Past Medical History:  Diagnosis Date  . Arthritis   . Asthma   . Breast cancer ([place]) 2006   right w/lumpectomy  . COPD (chronic obstructive pulmonary disease) ([place])   . Depression   . Diabetes ([place]) 2007  . Diverticulosis of colon   . Endometrial hyperplasia 01/1996   fibroids, adenomyosis  . Frequent UTI   . History of colon polyps   . Hypertension   . Insomnia   . Osteopenia 12/08   hip  . Spinal stenosis 2001  . Vitamin D deficiency     Past Surgical History:  Procedure Laterality Date  . BILATERAL SALPINGOOPHORECTOMY  12/97  . BREAST LUMPECTOMY Right 03/2003  . CARPAL TUNNEL RELEASE    . CERVICAL FUSION    . CERVICAL LAMINECTOMY  1975   x4  . CHOLECYSTECTOMY  11/00   lap chole  . HYSTEROSCOPY  10/97   D&C, postmenopausal bleeding  . LUMBAR LAMINECTOMY  10/06   with spinal stenosis  . manipulation of hip for dislocation    . ROTATOR CUFF REPAIR Right 11/96  . TONSILLECTOMY AND ADENOIDECTOMY    . TOTAL ABDOMINAL HYSTERECTOMY  12/97   endo hyperplasia, fibroids, adenomyosis  . TOTAL HIP ARTHROPLASTY Right 8/00  . TOTAL HIP REVISION Right 07/10/2015   Procedure: RIGHT ACETABULAR VS TOTAL HIP REVISION;  Surgeon: Gaynelle Arabian, MD;  Location: [facility];  Service: Orthopedics;  Laterality: Right;  . TOTAL KNEE ARTHROPLASTY Left 06/2003  . TOTAL KNEE ARTHROPLASTY Right 10/06  . TUBAL LIGATION Bilateral 1968     reports that she quit smoking about 26 years ago. She has never used smokeless tobacco. She reports that she  drinks about 2.4 oz of alcohol per week . She reports that she does not use drugs.  Allergies  Allergen Reactions  . Oxycodone Hcl [Oxycodone Hcl] Rash  . Morphine Sulfate Other (See Comments)    REACTION: very anxious  .  Sulfamethoxazole Other (See Comments)    REACTION: unspecified    Family History  Problem Relation Age of Onset  . Diabetes Father   . Colon cancer Father 7  . Heart failure Father   . Arthritis Mother   . Thyroid disease Mother   . Osteoporosis Mother   . Diabetes Brother   . Diabetes Brother   . Osteoporosis Maternal Aunt   . Osteoporosis Maternal Grandmother     Prior to Admission medications   Medication Sig Start Date End Date Taking? Authorizing Provider  acetaminophen (TYLENOL) 500 MG tablet Take 500 mg by mouth every 8 (eight) hours as needed for moderate pain.   Yes [provider]  acetaminophen (TYLENOL) 650 MG CR tablet Take 650 mg by mouth every 12 (twelve) hours as needed for pain.   Yes [provider]  aspirin EC 325 MG tablet Take 325 mg by mouth daily.    Yes [provider]  buPROPion (WELLBUTRIN XL) 300 MG 24 hr tablet Take 300 mg by mouth every morning.    Yes [provider]  Calcium Carb-Cholecalciferol (CALCIUM+D3) 600-800 MG-UNIT TABS Take 1 tablet by mouth daily.   Yes [provider]  Cholecalciferol (VITAMIN D) 2000 units tablet Take 2,000 Units by mouth daily.   Yes [provider]  clonazePAM (KLONOPIN) 1 MG tablet Take 1 tablet (1 mg total) by mouth at bedtime as needed (insomnia). 05/10/16  Yes Mikhail, Velta Addison, DO  docusate sodium (COLACE) 100 MG capsule Take 100 mg by mouth daily.   Yes [provider]  escitalopram (LEXAPRO) 10 MG tablet Take 10 mg by mouth daily.  04/20/11  Yes [provider]  fluticasone (FLONASE) 50 MCG/ACT nasal spray Place 1 spray into both nostrils 2 (two) times daily.   Yes [provider]  gabapentin (NEURONTIN) 300 MG capsule Take 1 capsule (300 mg total) by mouth 3 (three) times daily. 11/14/15  Yes Magrinat, Virgie Dad, MD  HYDROcodone-acetaminophen (NORCO) 7.5-325 MG tablet Take 1 tablet by mouth 2 (two) times daily as needed for moderate pain or  severe pain.   Yes [provider]  Ibuprofen (ADVIL) 200 MG CAPS Take 400 mg by mouth every 12 (twelve) hours as needed (pain).   Yes [provider]  mirabegron ER (MYRBETRIQ) 25 MG TB24 tablet Take 50 mg by mouth daily.   Yes [provider]  montelukast (SINGULAIR) 10 MG tablet Take 10 mg by mouth at bedtime.     Yes [provider]  Propylene Glycol-Glycerin (SOOTHE) 0.6-0.6 % SOLN Apply 1 drop to eye 2 (two) times daily. 11/28/15  Yes Domenic Moras, PA-C  acetaminophen (TYLENOL) 325 MG tablet Take 2 tablets (650 mg total) by mouth every 6 (six) hours as needed for mild pain (or Fever >/= 101). 07/12/15   Perkins, Alexzandrew L, PA-C  albuterol (PROAIR HFA) 108 (90 BASE) MCG/ACT inhaler Inhale 2 puffs into the lungs every 4 (four) hours as needed for shortness of breath.     [provider]  albuterol (PROVENTIL) (2.5 MG/3ML) 0.083% nebulizer solution Take 2.5 mg by nebulization every 4 (four) hours as needed for wheezing or shortness of breath.    [provider]  amoxicillin-clavulanate (AUGMENTIN) 875-125 MG  tablet Take 1 tablet by mouth every 12 (twelve) hours. Patient not taking: Reported on 08/09/2016 07/11/16   Regalado, Jerald Kief A, MD  benzonatate (TESSALON) 200 MG capsule Take 1 capsule (200 mg total) by mouth 3 (three) times daily as needed for cough. Patient not taking: Reported on 08/09/2016 07/11/16   Regalado, Jerald Kief A, MD  guaiFENesin (MUCINEX) 600 MG 12 hr tablet Take 600 mg by mouth 2 (two) times daily as needed for cough or to loosen phlegm.    [provider]  guaiFENesin-dextromethorphan (ROBITUSSIN DM) 100-10 MG/5ML syrup Take 15 mLs by mouth every 4 (four) hours as needed for cough.    [provider]  Homeopathic Products ([facility]) CREA Apply 1 application topically as needed (muscle pain/stiffness).    [provider]  HYDROcodone-acetaminophen (NORCO/VICODIN) 5-325 MG tablet Take 1 tablet by  mouth every 6 (six) hours as needed for moderate pain. Patient not taking: Reported on 08/09/2016 05/10/16   Cristal Ford, DO  ipratropium-albuterol (DUONEB) 0.5-2.5 (3) MG/3ML SOLN Take 3 mLs by nebulization 2 (two) times daily. Patient not taking: Reported on 08/09/2016 07/11/16   Regalado, Jerald Kief A, MD  menthol-cetylpyridinium (CEPACOL) 3 MG lozenge Take 1 lozenge by mouth every 4 (four) hours as needed for sore throat.    [provider]  methocarbamol (ROBAXIN) 500 MG tablet Take 1 tablet (500 mg total) by mouth every 6 (six) hours as needed for muscle spasms. 07/12/15   Perkins, Alexzandrew L, PA-C  mupirocin ointment (BACTROBAN) 2 % Place 1 application into the nose 2 (two) times daily. Use through 05/14/2016 Patient not taking: Reported on 07/07/2016 05/10/16   Cristal Ford, DO  ondansetron (ZOFRAN) 4 MG tablet Take 4 mg by mouth every 8 (eight) hours as needed for nausea or vomiting.    [provider]  polyethylene glycol (MIRALAX / GLYCOLAX) packet Take 17 g by mouth daily as needed for mild constipation. 07/12/15   Perkins, Alexzandrew L, PA-C  predniSONE (DELTASONE) 20 MG tablet Take 2 tablets for 3 days then 1 tablet for 2 days Patient not taking: Reported on 08/09/2016 07/11/16   Regalado, Jerald Kief A, MD  sodium phosphate (FLEET) 7-19 GM/118ML ENEM Place 133 mLs (1 enema total) rectally once as needed for severe constipation. 07/12/15   Joelene Millin, PA-C    Physical Exam:  Vitals:   08/09/16 0800 08/09/16 0953 08/09/16 0958 08/09/16 1000  BP: 137/63 (!) 156/89  (!) 164/84  Pulse: 99 89  100  Resp: (!) 23 (!) 22  (!) 27  Temp:  98.8 F (37.1 C)    TempSrc:  Oral    SpO2: 97% 98% (!) 87% 98%    Constitutional: NAD, calm, comfortable Eyes: PERRL, lids and conjunctivae normal ENMT: Mucous membranes are moist. Posterior pharynx clear of any exudate or lesions.Normal dentition.  Neck: normal, supple, no masses, no thyromegaly Respiratory: clear to  auscultation bilaterally, no wheezing, no crackles. Normal respiratory effort. No accessory muscle use.  Cardiovascular: Regular rate and rhythm, no murmurs / rubs / gallops. No extremity edema. 2+ pedal pulses. No carotid bruits.  Abdomen: no tenderness, no masses palpated. No hepatosplenomegaly. Bowel sounds positive.  Musculoskeletal: no clubbing / cyanosis. No joint deformity upper and lower extremities. Good ROM, no contractures. Normal muscle tone.  Skin: no rashes, lesions, ulcers. No induration Neurologic: CN 2-12 grossly intact. Sensation intact, DTR normal. Strength 5/5 in all 4.  Psychiatric: Normal judgment and insight. Alert and oriented x 3. Normal mood.   Labs on  Admission: I have personally reviewed following labs and imaging studies  CBC:  Recent Labs Lab 08/09/16 0114  WBC 12.3*  HGB 13.0  HCT 39.1  MCV 96.5  PLT 678   Basic Metabolic Panel:  Recent Labs Lab 08/09/16 0114  NA 139  K 4.1  CL 105  CO2 25  GLUCOSE 121*  BUN 19  CREATININE 0.66  CALCIUM 9.0   GFR: CrCl cannot be calculated (Unknown ideal weight.). Liver Function Tests:  Recent Labs Lab 08/09/16 0114  AST 22  ALT 17  ALKPHOS 73  BILITOT 0.5  PROT 6.9  ALBUMIN 3.9   No results for input(s): LIPASE, AMYLASE in the last 168 hours. No results for input(s): AMMONIA in the last 168 hours. Coagulation Profile: No results for input(s): INR, PROTIME in the last 168 hours. Cardiac Enzymes:  Recent Labs Lab 08/09/16 0526  TROPONINI <0.03   BNP (last 3 results) No results for input(s): PROBNP in the last 8760 hours. HbA1C: No results for input(s): HGBA1C in the last 72 hours. CBG: No results for input(s): GLUCAP in the last 168 hours. Lipid Profile: No results for input(s): CHOL, HDL, LDLCALC, TRIG, CHOLHDL, LDLDIRECT in the last 72 hours. Thyroid Function Tests: No results for input(s): TSH, T4TOTAL, FREET4, T3FREE, THYROIDAB in the last 72 hours. Anemia Panel: No results for  input(s): VITAMINB12, FOLATE, FERRITIN, TIBC, IRON, RETICCTPCT in the last 72 hours. Urine analysis:    Component Value Date/Time   COLORURINE YELLOW 08/09/2016 0245   APPEARANCEUR CLEAR 08/09/2016 0245   LABSPEC 1.021 08/09/2016 0245   PHURINE 6.0 08/09/2016 0245   GLUCOSEU NEGATIVE 08/09/2016 0245   HGBUR NEGATIVE 08/09/2016 0245   BILIRUBINUR NEGATIVE 08/09/2016 0245   BILIRUBINUR n 09/25/2010 1525   KETONESUR NEGATIVE 08/09/2016 0245   PROTEINUR 30 (A) 08/09/2016 0245   UROBILINOGEN 0.2 09/25/2010 1525   UROBILINOGEN 0.2 01/04/2007 0900   NITRITE NEGATIVE 08/09/2016 0245   LEUKOCYTESUR NEGATIVE 08/09/2016 0245   Sepsis Labs: !!!!!!!!!!!!!!!!!!!!!!!!!!!!!!!!!!!!!!!!!!!! Invalid input(s): PROCALCITONIN, LACTICIDVEN No results found for this or any previous visit (from the past 240 hour(s)).   Radiological Exams on Admission: Dg Chest 2 View  Result Date: 08/09/2016 CLINICAL DATA:  Generalized weakness. Assess for pneumonia. History of RIGHT breast cancer, hypertension and asthma. EXAM: CHEST  2 VIEW COMPARISON:  Chest radiograph Jul 09, 2016 FINDINGS: Cardiomediastinal silhouette is normal. Mildly calcified aortic knob. No pleural effusions or focal consolidations. RIGHT lung base strandy densities. Trachea projects midline and there is no pneumothorax. Soft tissue planes and included osseous structures are non-suspicious. ACDF. IMPRESSION: RIGHT lung base atelectasis. Electronically Signed   By: Elon Alas M.D.   On: 08/09/2016 03:19   Ct Angio Chest Pe W And/or Wo Contrast  Result Date: 08/09/2016 CLINICAL DATA:  complaints of generalized weakness. Facility and pt's husband stated "she is just not her self". AANDO x4. Pt unable to move per baseline. Ronchi noted in lower lobes. 2 L [place] on arrival due to desaturation. (Sat = 89 on arrival). Concern pulmonary embolism. EXAM: CT ANGIOGRAPHY CHEST WITH CONTRAST TECHNIQUE: Multidetector CT imaging of the chest was performed using  the standard protocol during bolus administration of intravenous contrast. Multiplanar CT image reconstructions and MIPs were obtained to evaluate the vascular anatomy. CONTRAST:  100 mL Isovue COMPARISON:  None. FINDINGS: Cardiovascular: Poor concentration of the contrast bolus. No evidence of pulmonary embolism in proximal pulmonary arteries. No gross evidence of pulmonary embolism in distal segmental pulmonary arteries. No acute findings of the aorta great  vessels. Mediastinum/Nodes: Nodularity in these deep breast tissue/axilla on the RIGHT measuring 2.0 cm (image 53, series 5). No supraclavicular adenopathy. No mediastinal adenopathy. No pericardial fluid. Esophagus normal Lungs/Pleura: There is are bilateral pulmonary nodules with peripheral ground-glass opacity suggesting foci of infection (image 53, 61 and 40 of the series 12). These predominantly in the lower lobes and inferior lingula. Upper Abdomen: Limited view of the liver, kidneys, pancreas are unremarkable. Normal adrenal glands. Musculoskeletal: No aggressive osseous lesion. Review of the MIP images confirms the above findings. IMPRESSION: 1. No evidence acute pulmonary embolism on suboptimal exam. 2. Bibasilar nodules with a peripheral ground-glass opacities suggest foci of acute pulmonary infection. 3. Nodular density in the deep RIGHT breast tissue versus axilla. Favor scar from lumpectomy 2005. If concern for breast cancer recommend mammogram. Electronically Signed   By: Suzy Bouchard M.D.   On: 08/09/2016 09:11    EKG: Independently reviewed.   Assessment/Plan Principal Problem:   Acute on chronic respiratory failure with hypoxia (HCC) Active Problems:   Breast cancer, right breast (HCC)   Failed total hip arthroplasty (HCC)   HCAP (healthcare-associated pneumonia)   Wheelchair bound   Wound healing, delayed    Acute on chronic respiratory failure with hypoxia -O2 saturation 87% on room air, this is likely secondary to  edge. -Started on antibiotics for HCAP, provide oxygen as needed. She is not on oxygen at home.  HCAP -This is a third pneumonia on the last 6 months. -Treat as HCAP, with cefepime and vancomycin. -Supportive management with broncho-dilators, mucolytics, antitussives and oxygen as needed. -Recurrent pneumonia with a big nodule in the right side, I have asked SLP to evaluate rule out aspiration.  History of right breast cancer -CT scan showed probable scar of the previous lumpectomy, follows outpatient.  Delayed wound healing -Patient has no bleeding wound in her both lower extremities, has some mild edema. -Probably has lower extremity PAD -The Lasix to decrease the edema, check 2-D echo.  Failed hip arthroplasty, she is wheelchair-bound -PT/OT to evaluate and treat, she lives in ALF currently she might need SNF.  History of hypertension -Reported she was taken off of her blood pressure medications because of history of falls and dizziness. -Follow clinically.   DVT prophylaxis: SQ Heparin Code Status: Full code Family Communication: Plan D/W patient Disposition Plan: Home Consults called:  Admission status: Inpatient   [facility] MD [facility] Pager [phone]  If 7PM-7AM, please contact night-coverage [email or web address] Password [facility]  08/09/2016, 10:52 AM

## 2016-08-09 NOTE — ED Notes (Signed)
Patient stated that she cant walk. Patient is too weak. Patient was able to stand but could not ambulate. Required 2 staff members and a walker to stand.

## 2016-08-09 NOTE — Progress Notes (Signed)
VASCULAR LAB PRELIMINARY  PRELIMINARY  PRELIMINARY  PRELIMINARY  Left lower extremity venous duplex completed.    Preliminary report:  Left:  No evidence of DVT, superficial thrombosis, or Baker's cyst.  Mairlyn Tegtmeyer, RVS 08/09/2016, 10:22 AM

## 2016-08-10 ENCOUNTER — Inpatient Hospital Stay (HOSPITAL_COMMUNITY): Payer: Medicare Other

## 2016-08-10 DIAGNOSIS — J9621 Acute and chronic respiratory failure with hypoxia: Secondary | ICD-10-CM

## 2016-08-10 DIAGNOSIS — I360 Nonrheumatic tricuspid (valve) stenosis: Secondary | ICD-10-CM

## 2016-08-10 LAB — CBC
HCT: 41.8 % (ref 36.0–46.0)
Hemoglobin: 14.5 g/dL (ref 12.0–15.0)
MCH: 32.4 pg (ref 26.0–34.0)
MCHC: 34.7 g/dL (ref 30.0–36.0)
MCV: 93.5 fL (ref 78.0–100.0)
PLATELETS: 192 10*3/uL (ref 150–400)
RBC: 4.47 MIL/uL (ref 3.87–5.11)
RDW: 13.9 % (ref 11.5–15.5)
WBC: 10.4 10*3/uL (ref 4.0–10.5)

## 2016-08-10 LAB — ECHOCARDIOGRAM COMPLETE
CHL CUP MV DEC (S): 180
EERAT: 11.5
EWDT: 180 ms
FS: 20 % — AB (ref 28–44)
IV/PV OW: 1.11
LA diam end sys: 27 mm
LA diam index: 1.34 cm/m2
LA vol A4C: 23.9 ml
LA vol: 32.2 mL
LASIZE: 27 mm
LAVOLIN: 16 mL/m2
LV E/e'average: 11.5
LV PW d: 10.4 mm — AB (ref 0.6–1.1)
LV SIMPSON'S DISK: 56
LV dias vol index: 42 mL/m2
LV dias vol: 85 mL (ref 46–106)
LV sys vol: 38 mL (ref 14–42)
LVEEMED: 11.5
LVELAT: 6.2 cm/s
LVOT VTI: 16.9 cm
LVOT area: 3.14 cm2
LVOT diameter: 20 mm
LVOTPV: 99 cm/s
LVOTSV: 53 mL
LVSYSVOLIN: 19 mL/m2
Lateral S' vel: 17.9 cm/s
MV pk A vel: 88.8 m/s
MV pk E vel: 71.3 m/s
MVPG: 2 mmHg
RV TAPSE: 26.7 mm
Stroke v: 47 ml
TDI e' lateral: 6.2
TDI e' medial: 7.07
WEIGHTICAEL: 3234.59 [oz_av]

## 2016-08-10 LAB — HEMOGLOBIN A1C
HEMOGLOBIN A1C: 5.6 % (ref 4.8–5.6)
MEAN PLASMA GLUCOSE: 114 mg/dL

## 2016-08-10 LAB — BASIC METABOLIC PANEL
Anion gap: 11 (ref 5–15)
BUN: 13 mg/dL (ref 6–20)
CHLORIDE: 97 mmol/L — AB (ref 101–111)
CO2: 27 mmol/L (ref 22–32)
CREATININE: 0.58 mg/dL (ref 0.44–1.00)
Calcium: 9.4 mg/dL (ref 8.9–10.3)
GFR calc Af Amer: >60 mL/min (ref >60)
GFR calc non Af Amer: >60 mL/min (ref >60)
GLUCOSE: 104 mg/dL — AB (ref 65–99)
Potassium: 3.7 mmol/L (ref 3.5–5.1)
SODIUM: 135 mmol/L (ref 135–145)

## 2016-08-10 LAB — URINE CULTURE: Culture: NO GROWTH

## 2016-08-10 MED ORDER — IPRATROPIUM-ALBUTEROL 0.5-2.5 (3) MG/3ML IN SOLN
3.0000 mL | Freq: Three times a day (TID) | RESPIRATORY_TRACT | Status: DC
  Administered 2016-08-11 – 2016-08-12 (×4): 3 mL via RESPIRATORY_TRACT
  Filled 2016-08-10 (×7): qty 3

## 2016-08-10 MED ORDER — PERFLUTREN LIPID MICROSPHERE
1.0000 mL | INTRAVENOUS | Status: AC | PRN
  Administered 2016-08-10: 2 mL via INTRAVENOUS
  Filled 2016-08-10: qty 10

## 2016-08-10 MED ORDER — CALCIUM CARBONATE-VITAMIN D 500-200 MG-UNIT PO TABS
1.0000 | ORAL_TABLET | Freq: Every day | ORAL | Status: DC
  Administered 2016-08-10 – 2016-08-12 (×3): 1 via ORAL
  Filled 2016-08-10 (×3): qty 1

## 2016-08-10 MED ORDER — PERFLUTREN LIPID MICROSPHERE
INTRAVENOUS | Status: AC
  Filled 2016-08-10: qty 10

## 2016-08-10 MED ORDER — SODIUM CHLORIDE 0.9 % IV SOLN
3.0000 g | Freq: Four times a day (QID) | INTRAVENOUS | Status: DC
  Administered 2016-08-10 – 2016-08-12 (×8): 3 g via INTRAVENOUS
  Filled 2016-08-10 (×11): qty 3

## 2016-08-10 NOTE — Progress Notes (Signed)
Patient ID: [name], female   DOB: 04/30/1930, 83 y.o.   MRN: [id number]  PROGRESS NOTE    [name]  IWO:[id number] DOB: [birth year] DOA: 08/09/2016 PCP: Reynold Bowen, MD   Brief Narrative:  83 year old female with history of COPD, breast cancer, cervical spinal stenosis and pneumonia presented with cough and fever. She was admitted with diagnosis of bibasilar pneumonia and started on intravenous antibiotics and supplemental oxygen.   Assessment & Plan:   Principal Problem:   Acute on chronic respiratory failure with hypoxia (HCC) Active Problems:   Breast cancer, right breast (HCC)   Failed total hip arthroplasty ([place])   HCAP (healthcare-associated pneumonia)   Wheelchair bound   Wound healing, delayed   Acute hypoxemic respiratory failure ([place])  Hypoxia -Probably from pneumonia. Continue supplemental oxygen and wean off as able.  HCAP -With probable gram-negative rods; doubt that patient has MRSA pneumonia  -This is a third pneumonia on the last 6 months. -Treat as HCAP: DC vancomycin; Switch cefepime to Unasyn -Follow SLP evaluation and recommendations  History of right breast cancer -CT scan showed probable scar of the previous lumpectomy, follows outpatient.  Failed hip arthroplasty, she is wheelchair-bound -PT/OT to evaluate and treat, she lives in ALF currently she might need SNF. - Social worker evaluation  History of hypertension -Reported she was taken off of her blood pressure medications because of history of falls and dizziness. -Follow clinically.   DVT prophylaxis: SQ Heparin Code Status: Full code Family Communication:  none at bedside Disposition Plan:  assisted living facility versus nursing home in 2-3 days Procedures: None  Antimicrobials: Cefepime and vancomycin from 08/09/2016    Subjective: Patient seen and examined at bedside. She denies current fever, nausea, vomiting, chest pain.  Objective: Vitals:   08/09/16 2124  08/10/16 0542 08/10/16 0733 08/10/16 1120  BP:  (!) 144/81    Pulse:  (!) 103 (!) 104   Resp:  20 18   Temp:  99 F (37.2 C)    TempSrc:  Oral    SpO2: 95% 95% 93% 92%  Weight:        Intake/Output Summary (Last 24 hours) at 08/10/16 1214 Last data filed at 08/10/16 0600  Gross per 24 hour  Intake              200 ml  Output             2100 ml  Net            -1900 ml   Filed Weights   08/09/16 1147  Weight: 91.7 kg (202 lb 2.6 oz)    Examination:  General exam: Appears calm and comfortable  Respiratory system: Bilateral decreased breath sound at bases With scattered crackles Cardiovascular system: S1 & S2 heard, rate controlled. Gastrointestinal system: Abdomen is nondistended, soft and nontender. Normal bowel sounds heard. Central nervous system: Alert and oriented. No focal neurological deficits. Moving extremities Extremities: No cyanosis, clubbing; trace pedal edema  Skin: No rashes, lesions or ulcers Psychiatry: Judgement and insight appear normal. Mood & affect appropriate.     Data Reviewed: I have personally reviewed following labs and imaging studies  CBC:  Recent Labs Lab 08/09/16 0114 08/10/16 0528  WBC 12.3* 10.4  HGB 13.0 14.5  HCT 39.1 41.8  MCV 96.5 93.5  PLT 198 500   Basic Metabolic Panel:  Recent Labs Lab 08/09/16 0114 08/10/16 0528  NA 139 135  K 4.1 3.7  CL 105 97*  CO2 25  27  GLUCOSE 121* 104*  BUN 19 13  CREATININE 0.66 0.58  CALCIUM 9.0 9.4   GFR: Estimated Creatinine Clearance: 59.7 mL/min (by C-G formula based on SCr of 0.58 mg/dL). Liver Function Tests:  Recent Labs Lab 08/09/16 0114  AST 22  ALT 17  ALKPHOS 73  BILITOT 0.5  PROT 6.9  ALBUMIN 3.9   No results for input(s): LIPASE, AMYLASE in the last 168 hours. No results for input(s): AMMONIA in the last 168 hours. Coagulation Profile:  Recent Labs Lab 08/09/16 1233  INR 1.07   Cardiac Enzymes:  Recent Labs Lab 08/09/16 0526  TROPONINI <0.03    BNP (last 3 results) No results for input(s): PROBNP in the last 8760 hours. HbA1C:  Recent Labs  08/09/16 1233  HGBA1C 5.6   CBG: No results for input(s): GLUCAP in the last 168 hours. Lipid Profile: No results for input(s): CHOL, HDL, LDLCALC, TRIG, CHOLHDL, LDLDIRECT in the last 72 hours. Thyroid Function Tests:  Recent Labs  08/09/16 1233  TSH 0.791   Anemia Panel: No results for input(s): VITAMINB12, FOLATE, FERRITIN, TIBC, IRON, RETICCTPCT in the last 72 hours. Sepsis Labs:  Recent Labs Lab 08/09/16 0826 08/09/16 1112  LATICACIDVEN 0.61 0.50    Recent Results (from the past 240 hour(s))  Urine culture     Status: None   Collection Time: 08/09/16  2:45 AM  Result Value Ref Range Status   Specimen Description URINE, RANDOM  Final   Special Requests NONE  Final   Culture   Final    NO GROWTH Performed at [facility], [address], [place], [place] [address]    Report Status 08/10/2016 FINAL  Final  Blood culture (routine x 2)     Status: None (Preliminary result)   Collection Time: 08/09/16  8:05 AM  Result Value Ref Range Status   Specimen Description BLOOD LEFT HAND  Final   Special Requests Blood Culture adequate volume  Final   Culture   Final    NO GROWTH < 24 HOURS Performed at [facility], [place] [address] [place], [place] [address]    Report Status PENDING  Incomplete  Blood culture (routine x 2)     Status: None (Preliminary result)   Collection Time: 08/09/16  8:15 AM  Result Value Ref Range Status   Specimen Description BLOOD RIGHT ARM  Final   Special Requests Blood Culture adequate volume  Final   Culture   Final    NO GROWTH < 24 HOURS Performed at [facility], [place] [address] [place], [place] [address]    Report Status PENDING  Incomplete  MRSA PCR Screening     Status: None   Collection Time: 08/09/16  3:00 PM  Result Value Ref Range Status   MRSA by PCR NEGATIVE NEGATIVE Final    Comment:        The  GeneXpert MRSA Assay (FDA approved for NASAL specimens only), is one component of a comprehensive MRSA colonization surveillance program. It is not intended to diagnose MRSA infection nor to guide or monitor treatment for MRSA infections.          Radiology Studies: Dg Chest 2 View  Result Date: 08/09/2016 CLINICAL DATA:  Generalized weakness. Assess for pneumonia. History of RIGHT breast cancer, hypertension and asthma. EXAM: CHEST  2 VIEW COMPARISON:  Chest radiograph Jul 09, 2016 FINDINGS: Cardiomediastinal silhouette is normal. Mildly calcified aortic knob. No pleural effusions or focal consolidations. RIGHT lung  base strandy densities. Trachea projects midline and there is no pneumothorax. Soft tissue planes and included osseous structures are non-suspicious. ACDF. IMPRESSION: RIGHT lung base atelectasis. Electronically Signed   By: Elon Alas M.D.   On: 08/09/2016 03:19   Ct Angio Chest Pe W And/or Wo Contrast  Result Date: 08/09/2016 CLINICAL DATA:  complaints of generalized weakness. Facility and pt's husband stated "she is just not her self". AANDO x4. Pt unable to move per baseline. Ronchi noted in lower lobes. 2 L [place] on arrival due to desaturation. (Sat = 89 on arrival). Concern pulmonary embolism. EXAM: CT ANGIOGRAPHY CHEST WITH CONTRAST TECHNIQUE: Multidetector CT imaging of the chest was performed using the standard protocol during bolus administration of intravenous contrast. Multiplanar CT image reconstructions and MIPs were obtained to evaluate the vascular anatomy. CONTRAST:  100 mL Isovue COMPARISON:  None. FINDINGS: Cardiovascular: Poor concentration of the contrast bolus. No evidence of pulmonary embolism in proximal pulmonary arteries. No gross evidence of pulmonary embolism in distal segmental pulmonary arteries. No acute findings of the aorta great vessels. Mediastinum/Nodes: Nodularity in these deep breast tissue/axilla on the RIGHT measuring 2.0 cm (image 53,  series 5). No supraclavicular adenopathy. No mediastinal adenopathy. No pericardial fluid. Esophagus normal Lungs/Pleura: There is are bilateral pulmonary nodules with peripheral ground-glass opacity suggesting foci of infection (image 53, 61 and 40 of the series 12). These predominantly in the lower lobes and inferior lingula. Upper Abdomen: Limited view of the liver, kidneys, pancreas are unremarkable. Normal adrenal glands. Musculoskeletal: No aggressive osseous lesion. Review of the MIP images confirms the above findings. IMPRESSION: 1. No evidence acute pulmonary embolism on suboptimal exam. 2. Bibasilar nodules with a peripheral ground-glass opacities suggest foci of acute pulmonary infection. 3. Nodular density in the deep RIGHT breast tissue versus axilla. Favor scar from lumpectomy 2005. If concern for breast cancer recommend mammogram. Electronically Signed   By: Suzy Bouchard M.D.   On: 08/09/2016 09:11   Dg Esophagus  Result Date: 08/10/2016 CLINICAL DATA:  Coughing when eating. Sensation of food getting caught in throat. EXAM: ESOPHOGRAM/BARIUM SWALLOW TECHNIQUE: Single contrast examination was performed using  thin barium. FLUOROSCOPY TIME:  Fluoroscopy Time:  1 minutes and 30 seconds. Radiation Exposure Index (if provided by the fluoroscopic device): 49 mGy Number of Acquired Spot Images: 0 COMPARISON:  CT scan 08/09/2016 FINDINGS: Given limited patient mobility and discomfort, limited single-contrast study was performed with the patient LPO and supine positions relative to the fluoro table. Assessment of the esophagus while the patient swallows thin barium shows no gross esophageal mass, diverticulum , or ulcer. No gross esophageal stricture. The patient did have week/mild coughing after some swallows, but assessment of the airway after these episodes revealed no barium within the trachea or mainstem bronchi. Tertiary contractions were noted in the mid and distal esophagus on all swallows with  disruption of primary peristalsis. IMPRESSION: 1. No gross esophageal abnormality on this single contrast limited evaluation. 2. Nonspecific esophageal motility disorder with tertiary contractions in the mid and distal esophagus. No overt presbyesophagus. Electronically Signed   By: Misty Stanley M.D.   On: 08/10/2016 09:10        Scheduled Meds: . aspirin EC  325 mg Oral Daily  . buPROPion  300 mg Oral Daily  . calcium-vitamin D  1 tablet Oral Daily  . cholecalciferol  2,000 Units Oral Daily  . docusate sodium  100 mg Oral Daily  . escitalopram  10 mg Oral Daily  . fluticasone  1  spray Each Nare BID  . gabapentin  300 mg Oral TID  . heparin  5,000 Units Subcutaneous Q8H  . ipratropium-albuterol  3 mL Nebulization TID  . mirabegron ER  50 mg Oral Daily  . montelukast  10 mg Oral QHS  . polyvinyl alcohol  1 drop Both Eyes BID  . sodium chloride flush  3 mL Intravenous Q12H   Continuous Infusions: . ampicillin-sulbactam (UNASYN) IV 3 g (08/10/16 1123)     LOS: 1 day        Aline August, MD [facility] Pager [phone]  If 7PM-7AM, please contact night-coverage [email or web address] Password [facility] 08/10/2016, 12:14 PM

## 2016-08-10 NOTE — Progress Notes (Signed)
  Echocardiogram 2D Echocardiogram has been performed.  Bobbye Charleston 08/10/2016, 4:26 PM

## 2016-08-10 NOTE — Clinical Social Work Note (Addendum)
Clinical Social Work Assessment  Patient Details  Name: [name] MRN: [id number] Date of Birth: 10-Jan-1931  Date of referral:  08/10/16               Reason for consult:  Facility Placement                Permission sought to share information with:  Family Supports Permission granted to share information::     Name::     Si Raider  Agency::  [facility]  Relationship::  Daughter  Contact Information:  [phone]  Housing/Transportation Living arrangements for the past 2 months:  [place] of Information:  Other (Comment Required) (Sibling ) Patient Interpreter Needed:  None Criminal Activity/Legal Involvement Pertinent to Current Situation/Hospitalization:  No - Comment as needed Significant Relationships:  Siblings, Warehouse manager, Adult Children Lives with:  Facility Resident Do you feel safe going back to the place where you live?  Yes Need for family participation in patient care:  Yes (Comment)  Care giving concerns:  Patient admitted for shortness of breath. Patient has history of significant COPD, breast cancer and cervical spinal stenosis. The patient has been admittted twice in the past six months for pneumonia. Patient family is concern with her breathing and frequent hospital admission.   Social Worker assessment / plan: CSW spoke with patient daughter by phone. Patient is from [facility], patient uses a wheel chair. Patient daughter reports they are unsure if patient can benefit from a short stay a SNF before returning to ALF. She reports her and patient son have been discussing best plan. Patient will need speech therapy, physical therapy consult is still pending at this time.  CSW spoke with Estill Batten the med tech at facility. She reports the patient tries to be very independent.  She reports the patient can feed herself but more recently has needed help with her ADL's.  She reports facility director is Dedra Skeens,  that will help determine if patient can return once ready for discharge.  Plan: Follow and assist with discharge.   Employment status:  Retired Forensic scientist:  Medicare PT Recommendations:  Not assessed at this time Information / Referral to community resources:     Patient/Family's Response to care:  Agreeable to current level of care.   Patient/Family's Understanding of and Emotional Response to Diagnosis, Current Treatment, and Prognosis:  "We want to do the best thing for her but we need the physicain to inform what is best."  Emotional Assessment Appearance:  Appears stated age, Developmentally appropriate Attitude/Demeanor/Rapport:    Affect (typically observed):    Orientation:  Oriented x3 Alcohol / Substance use:  Not Applicable Psych involvement (Current and /or in the community):  No (Comment)  Discharge Needs  Concerns to be addressed:  Care Coordination, Discharge Planning Concerns Readmission within the last 30 days:  No Current discharge risk:  None Barriers to Discharge:  Continued Medical Work up   [employer], LCSW 08/10/2016, 10:10 AM

## 2016-08-10 NOTE — Progress Notes (Signed)
Pharmacy Antibiotic Follow-up Note  [name] is a 83 y.o. year-old female admitted on 08/09/2016 with suspected recurrent aspiration pneumonia.  Pharmacy was initially consulted for Vancomycin and Cefepime dosing, now narrowed to Unasyn.  Barium swallow/esophogram pending.  Assessment/Plan:  Unasyn 3g IV q6h  Follow up renal fxn, culture results, and clinical course.  Temp (24hrs), Avg:99 F (37.2 C), Min:98.3 F (36.8 C), Max:99.6 F (37.6 C)   Recent Labs Lab 08/09/16 0114 08/10/16 0528  WBC 12.3* 10.4     Recent Labs Lab 08/09/16 0114 08/10/16 0528  CREATININE 0.66 0.58   Estimated Creatinine Clearance: 59.7 mL/min (by C-G formula based on SCr of 0.58 mg/dL).    Allergies  Allergen Reactions  . Oxycodone Hcl [Oxycodone Hcl] Rash  . Morphine Sulfate Other (See Comments)    REACTION: very anxious  . Sulfamethoxazole Other (See Comments)    REACTION: unspecified   Antimicrobials this admission: 6/24 Vancomycin >> 6/25 6/24 Cefepime >> 6/25 6/25 Unasyn >>  Levels/dose changes this admission:  Microbiology results: 6/24 BCx: sent 6/24 UCx: NGF 6/24 MRSA PCR: negative   Thank you for allowing pharmacy to be a part of this patient's care.  Gretta Arab PharmD, BCPS Pager [phone] 08/10/2016 10:27 AM

## 2016-08-10 NOTE — Progress Notes (Signed)
Pt is awake and alert in the bed. Assessment is unchanged from morning RN. Will continue to monitor

## 2016-08-10 NOTE — Evaluation (Signed)
Occupational Therapy Evaluation Patient Details Name: [name] MRN: [id number] DOB: [birth year] Today's Date: 08/10/2016    History of Present Illness 83 year old female with history of COPD, breast cancer, cervical spinal stenosis and pneumonia presented with cough and fever. She was admitted with diagnosis of bibasilar pneumonia and started on intravenous antibiotics and supplemental oxygen.   Clinical Impression   Pt admitted with pneumonia. Pt currently with functional limitations due to the deficits listed below (see OT Problem List). Pt will benefit from skilled OT to increase their safety and independence with ADL and functional mobility for ADL to facilitate discharge to venue listed below.      Follow Up Recommendations  SNF - pt may need ST SNF prior to returning to ALF. Pt needed significant A   Equipment Recommendations  None recommended by OT       Precautions / Restrictions Precautions Precautions: Fall      Mobility Bed Mobility Overal bed mobility: Needs Assistance Bed Mobility: Rolling;Supine to Sit;Sit to Supine Rolling: Mod assist   Supine to sit: Max assist Sit to supine: Max assist   General bed mobility comments: pt needed significant A with all mobility.  Transfers                 General transfer comment: did not perform    Balance Overall balance assessment: Needs assistance;History of Falls Sitting-balance support: Single extremity supported Sitting balance-Leahy Scale: Fair                                     ADL either performed or assessed with clinical judgement   ADL Overall ADL's : Needs assistance/impaired     Grooming: Minimal assistance;Sitting;Cueing for sequencing;Cueing for safety   Upper Body Bathing: Moderate assistance;Sitting   Lower Body Bathing: Maximal assistance;Total assistance;Sitting/lateral leans   Upper Body Dressing : Minimal assistance;Sitting   Lower Body Dressing: Maximal  assistance;With caregiver independent assisting;Sitting/lateral leans       Toileting- Clothing Manipulation and Hygiene: Maximal assistance;Bed level         General ADL Comments: Upon returning to supine pt had BM.  CNA and OT worked together to clean her in bed with rolling L and R                  Pertinent Vitals/Pain Pain Score: 3  Pain Location: L arm and neck Pain Descriptors / Indicators: Sore Pain Intervention(s): Monitored during session;Repositioned        Extremity/Trunk Assessment Upper Extremity Assessment Upper Extremity Assessment: Generalized weakness (pt complaining of pain RUE. enocouraged and performed AROM sitting EOB)           Communication Communication Communication: No difficulties   Cognition Arousal/Alertness: Awake/alert Behavior During Therapy: WFL for tasks assessed/performed Overall Cognitive Status: Within Functional Limits for tasks assessed                                                Home Living Family/patient expects to be discharged to:: Assisted living                             Home Equipment: Wheelchair - [employer] - 2 wheels;Toilet riser   Additional Comments: [facility]  Prior Functioning/Environment Level of Independence: Needs assistance  Gait / Transfers Assistance Needed: self propels her w/c - patient  not clear as to when she last could  go to meals via W/c ADL's / Homemaking Assistance Needed: has CNA's who  assist, unclear how recnetly that she has required more assistance   Comments: pt states she can call staff for assistance        OT Problem List: Decreased strength;Impaired balance (sitting and/or standing);Decreased activity tolerance;Decreased safety awareness;Decreased knowledge of use of DME or AE      OT Treatment/Interventions: Self-care/ADL training;Patient/family education;DME and/or AE instruction;Therapeutic activities    OT Goals(Current  goals can be found in the care plan section) Acute Rehab OT Goals Patient Stated Goal: go back to ALF  OT Goal Formulation: With patient Time For Goal Achievement: 08/24/16  OT Frequency: Min 2X/week   Barriers to D/C:               AM-PAC PT "6 Clicks" Daily Activity     Outcome Measure Help from another person eating meals?: A Little Help from another person taking care of personal grooming?: A Little Help from another person toileting, which includes using toliet, bedpan, or urinal?: Total Help from another person bathing (including washing, rinsing, drying)?: A Lot Help from another person to put on and taking off regular upper body clothing?: A Little Help from another person to put on and taking off regular lower body clothing?: Total 6 Click Score: 13   End of Session Nurse Communication: Mobility status  Activity Tolerance: Patient limited by fatigue Patient left: in bed;with call bell/phone within reach  OT Visit Diagnosis: Repeated falls (R29.6);Muscle weakness (generalized) (M62.81);History of falling (Z91.81)                Time: 0092-3300 OT Time Calculation (min): 39 min Charges:  OT General Charges $OT Visit: 1 Procedure OT Evaluation $OT Eval Moderate Complexity: 1 Procedure OT Treatments $Self Care/Home Management : 23-37 mins G-Codes:     Kari Baars, OT [phone]  Payton Mccallum D 08/10/2016, 12:58 PM

## 2016-08-10 NOTE — CV Procedure (Signed)
2D echo attempted, but patient getting esophageal eval, per RN. Will try another time

## 2016-08-10 NOTE — Progress Notes (Signed)
  Speech Language Pathology Treatment: Dysphagia  Patient Details Name: [name] MRN: [id number] DOB: [birth year] Today's Date: 08/10/2016 Time: 9767-3419 SLP Time Calculation (min) (ACUTE ONLY): 20 min  Assessment / Plan / Recommendation Clinical Impression  Pt educated to findings re swallow evaluation and compensation strategies using teach back and written tips with min cues.  Patient reports understanding.  She is sleepy post echo.  Advised to strict precautions.  No speech follow up as pt educated.   HPI HPI: see prior note      SLP Plan  All goals met;Discharge SLP treatment due to (comment)       Recommendations  Diet recommendations: Regular;Thin liquid Liquids provided via: Cup;Straw Medication Administration: Whole meds with liquid Supervision: Patient able to self feed                Plan: All goals met;Discharge SLP treatment due to (comment)       GO                Macario Golds 08/10/2016, 6:03 PM

## 2016-08-10 NOTE — Evaluation (Signed)
Physical Therapy Evaluation Patient Details Name: [name] MRN: [id number] DOB: 10-16-1930 Today's Date: 08/10/2016   History of Present Illness  83 year old female with history of COPD, breast cancer, cervical spinal stenosis and pneumonia presented with cough and fever. She was admitted with diagnosis of bibasilar pneumonia and started on intravenous antibiotics and supplemental oxygen.  Clinical Impression  Pt admitted with above diagnosis. Pt currently with functional limitations due to the deficits listed below (see PT Problem List).  Pt will benefit from skilled PT to increase their independence and safety with mobility to allow discharge to the venue listed below.  Pt with a couple recent admissions and has declined SNF placement in the past however appears more agreeable during this session.  Pt requiring extensive assist for bed mobility and too fatigued and weak to attempt standing today.  Recommend SNF upon d/c.     Follow Up Recommendations SNF    Equipment Recommendations  None recommended by PT    Recommendations for Other Services       Precautions / Restrictions Precautions Precautions: Fall Precaution Comments: wears soft c-collar at times (distant hx of cervical surgeries) (not present in room today so positioned to comfort with pillows and towel rolls)      Mobility  Bed Mobility Overal bed mobility: Needs Assistance Bed Mobility: Supine to Sit;Sit to Supine     Supine to sit: Max assist;HOB elevated;+2 for physical assistance Sit to supine: Total assist;+2 for physical assistance   General bed mobility comments: pt reports increased fatigue and weakness, requiring increased assist for mobility, remained on O2 [place]  Transfers                 General transfer comment: NT for safety  Ambulation/Gait                Stairs            Wheelchair Mobility    Modified Rankin (Stroke Patients Only)       Balance Overall balance  assessment: Needs assistance;History of Falls Sitting-balance support: Feet supported;No upper extremity supported Sitting balance-Leahy Scale: Fair                                       Pertinent Vitals/Pain Pain Assessment: 0-10 Pain Score: 4  Pain Location: L arm and neck Pain Descriptors / Indicators: Sore Pain Intervention(s): Monitored during session;Repositioned (pt had kpad under L arm on arrival and departure)    Home Living Family/patient expects to be discharged to:: Assisted living               Home Equipment: Wheelchair - [employer] - 2 wheels;Toilet riser Additional Comments: [facility]    Prior Function Level of Independence: Needs assistance   Gait / Transfers Assistance Needed: self propels her w/c - reports limited ability to get OOB for the past week  ADL's / Homemaking Assistance Needed: has CNA's who assist, unclear how recently that she has required more assistance  Comments: pt states she can call staff for assistance     Hand Dominance        Extremity/Trunk Assessment        Lower Extremity Assessment Lower Extremity Assessment: RLE deficits/detail;Generalized weakness RLE Deficits / Details: hx of multiple R hip surgeries, pt reports weaker LE       Communication   Communication: No difficulties  Cognition Arousal/Alertness: Awake/alert Behavior During  Therapy: WFL for tasks assessed/performed Overall Cognitive Status: Within Functional Limits for tasks assessed                                        General Comments      Exercises     Assessment/Plan    PT Assessment Patient needs continued PT services  PT Problem List Decreased strength;Decreased mobility;Decreased knowledge of use of DME;Decreased activity tolerance;Decreased balance       PT Treatment Interventions DME instruction;Therapeutic exercise;Therapeutic activities;Functional mobility training;Wheelchair mobility  training;Patient/family education    PT Goals (Current goals can be found in the Care Plan section)  Acute Rehab PT Goals Patient Stated Goal: go back to ALF  PT Goal Formulation: With patient Time For Goal Achievement: 08/24/16 Potential to Achieve Goals: Fair    Frequency Min 3X/week   Barriers to discharge        Co-evaluation               AM-PAC PT "6 Clicks" Daily Activity  Outcome Measure Difficulty turning over in bed (including adjusting bedclothes, sheets and blankets)?: Total Difficulty moving from lying on back to sitting on the side of the bed? : Total Difficulty sitting down on and standing up from a chair with arms (e.g., wheelchair, bedside commode, etc,.)?: Total Help needed moving to and from a bed to chair (including a wheelchair)?: Total Help needed walking in hospital room?: Total Help needed climbing 3-5 steps with a railing? : Total 6 Click Score: 6    End of Session Equipment Utilized During Treatment: Oxygen Activity Tolerance: Patient limited by fatigue Patient left: in bed;with call bell/phone within reach;with bed alarm set   PT Visit Diagnosis: Muscle weakness (generalized) (M62.81)    Time: 5638-7564 PT Time Calculation (min) (ACUTE ONLY): 13 min   Charges:   PT Evaluation $PT Eval Low Complexity: 1 Procedure     PT G CodesCarmelia Bake, PT, DPT 08/10/2016 Pager: [phone]   York Ram E 08/10/2016, 4:57 PM

## 2016-08-11 LAB — CBC
HCT: 42 % (ref 36.0–46.0)
HEMOGLOBIN: 14.2 g/dL (ref 12.0–15.0)
MCH: 32.2 pg (ref 26.0–34.0)
MCHC: 33.8 g/dL (ref 30.0–36.0)
MCV: 95.2 fL (ref 78.0–100.0)
Platelets: 207 10*3/uL (ref 150–400)
RBC: 4.41 MIL/uL (ref 3.87–5.11)
RDW: 14 % (ref 11.5–15.5)
WBC: 10 10*3/uL (ref 4.0–10.5)

## 2016-08-11 LAB — MAGNESIUM: MAGNESIUM: 1.7 mg/dL (ref 1.7–2.4)

## 2016-08-11 LAB — BASIC METABOLIC PANEL
ANION GAP: 10 (ref 5–15)
BUN: 15 mg/dL (ref 6–20)
CALCIUM: 9.2 mg/dL (ref 8.9–10.3)
CO2: 29 mmol/L (ref 22–32)
Chloride: 97 mmol/L — ABNORMAL LOW (ref 101–111)
Creatinine, Ser: 0.57 mg/dL (ref 0.44–1.00)
GFR calc non Af Amer: >60 mL/min (ref >60)
GLUCOSE: 106 mg/dL — AB (ref 65–99)
POTASSIUM: 3.5 mmol/L (ref 3.5–5.1)
Sodium: 136 mmol/L (ref 135–145)

## 2016-08-11 MED ORDER — HYDROCODONE-ACETAMINOPHEN 7.5-325 MG PO TABS
1.0000 | ORAL_TABLET | Freq: Four times a day (QID) | ORAL | Status: DC | PRN
  Administered 2016-08-11 – 2016-08-12 (×4): 1 via ORAL
  Filled 2016-08-11 (×4): qty 1

## 2016-08-11 MED ORDER — METOPROLOL TARTRATE 25 MG PO TABS
25.0000 mg | ORAL_TABLET | Freq: Two times a day (BID) | ORAL | Status: DC
  Administered 2016-08-11 – 2016-08-12 (×3): 25 mg via ORAL
  Filled 2016-08-11 (×3): qty 1

## 2016-08-11 MED ORDER — DEXTROSE 5 % IV SOLN
500.0000 mg | Freq: Every day | INTRAVENOUS | Status: DC
  Administered 2016-08-12: 500 mg via INTRAVENOUS
  Filled 2016-08-11: qty 550
  Filled 2016-08-11: qty 5

## 2016-08-11 NOTE — NC FL2 (Signed)
[place] LEVEL OF CARE SCREENING TOOL     IDENTIFICATION  Patient Name: [name] Birthdate: 07-07-1930 Sex: female Admission Date (Current Location): 08/09/2016  [facility] and [place] Number:  Herbalist and Address:  [facility],  [place] [address] [place]      Provider Number: [id number]  Attending Physician Name and Address:  Aline August, MD  Relative Name and Phone Number:       Current Level of Care: Hospital Recommended Level of Care: [place] Prior Approval Number:    Date Approved/Denied:   PASRR Number:   [id number]   Discharge Plan: SNF    Current Diagnoses: Patient Active Problem List   Diagnosis Date Noted  . PNA (pneumonia) 08/09/2016  . Acute on chronic respiratory failure with hypoxia ([place]) 08/09/2016  . Wheelchair bound 08/09/2016  . Wound healing, delayed 08/09/2016  . Acute hypoxemic respiratory failure ([place]) 08/09/2016  . HCAP (healthcare-associated pneumonia) 07/07/2016  . Chronic ulcer of right leg ([place]) 07/07/2016  . SIRS (systemic inflammatory response syndrome) ([place]) 05/08/2016  . Community acquired pneumonia 05/08/2016  . Failed total hip arthroplasty ([address]) 07/10/2015  . Breast cancer, right breast ([place]) 07/18/2014  . CONCUSSION WITH LOC OF UNSPECIFIED DURATION 07/19/2009  . OTH SYMPTOMS INVOLVING RESPIRATORY SYSTEM&CHEST 11/12/2008  . UNSPECIFIED DISORDER OF ANKLE AND FOOT JOINT 05/11/2008  . CERUMEN IMPACTION, BILATERAL 05/04/2008  . DYSMETABOLIC SYNDROME X 47/82/9562  . UNS ADVRS EFF UNS RX MEDICINAL&BIOLOGICAL SBSTNC 08/12/2007  . Other and unspecified hyperlipidemia 04/01/2007  . INSOMNIA, PERSISTENT 09/09/2006  . Depression 09/09/2006  . Essential hypertension 09/09/2006  . Asthma, chronic, unspecified asthma severity, with acute exacerbation 09/09/2006  . DIVERTICULOSIS, COLON 09/09/2006  . OSTEOARTHRITIS 09/09/2006  . STENOSIS, LUMBAR SPINE 09/09/2006  .  COLONIC POLYPS, HX OF 09/09/2006    Orientation RESPIRATION BLADDER Height & Weight     Self, Time, Situation, Place  Normal Continent Weight: 202 lb 2.6 oz (91.7 kg) Height:     BEHAVIORAL SYMPTOMS/MOOD NEUROLOGICAL BOWEL NUTRITION STATUS      Continent Diet (See D/C summary )  AMBULATORY STATUS COMMUNICATION OF NEEDS Skin   Extensive Assist Verbally Normal                       Personal Care Assistance Level of Assistance  Bathing, Feeding, Dressing Bathing Assistance: Limited assistance Feeding assistance: Independent Dressing Assistance: Limited assistance     Functional Limitations Info  Sight, Hearing, Speech Sight Info: Adequate Hearing Info: Adequate Speech Info: Adequate    SPECIAL CARE FACTORS FREQUENCY  PT (By licensed PT), OT (By licensed OT), Speech therapy    PT Frequency: Min 3X/week       Speech Therapy Frequency: min 2x/week      Contractures Contractures Info: Not present    Additional Factors Info  Code Status, Allergies Code Status Info: Fullcode Allergies Info: Oxycodone Hcl Oxycodone Hcl, Morphine Sulfate, Sulfamethoxazole           Current Medications (08/11/2016):  This is the current hospital active medication list Current Facility-Administered Medications  Medication Dose Route Frequency Provider Last Rate Last Dose  . acetaminophen (TYLENOL) tablet 650 mg  650 mg Oral Q6H PRN Verlee Monte, MD       Or  . acetaminophen (TYLENOL) suppository 650 mg  650 mg Rectal Q6H PRN Elmahi, Mutaz, MD      . albuterol (PROVENTIL) (2.5 MG/3ML) 0.083% nebulizer solution 3 mL  3 mL Inhalation  Q4H PRN Verlee Monte, MD      . Ampicillin-Sulbactam (UNASYN) 3 g in sodium chloride 0.9 % 100 mL IVPB  3 g Intravenous Q6H Shade, Christine E, RPH 200 mL/hr at 08/11/16 0545 3 g at 08/11/16 0545  . aspirin EC tablet 325 mg  325 mg Oral Daily Verlee Monte, MD   325 mg at 08/10/16 1020  . buPROPion (WELLBUTRIN XL) 24 hr tablet 300 mg  300 mg Oral Daily  Verlee Monte, MD   300 mg at 08/10/16 1021  . calcium-vitamin D (OSCAL WITH D) 500-200 MG-UNIT per tablet 1 tablet  1 tablet Oral Daily Verlee Monte, MD   1 tablet at 08/10/16 1020  . cholecalciferol (VITAMIN D) tablet 2,000 Units  2,000 Units Oral Daily Verlee Monte, MD   2,000 Units at 08/10/16 1020  . clonazePAM (KLONOPIN) tablet 1 mg  1 mg Oral QHS PRN Verlee Monte, MD   1 mg at 08/10/16 2207  . docusate sodium (COLACE) capsule 100 mg  100 mg Oral Daily Verlee Monte, MD   100 mg at 08/09/16 1258  . escitalopram (LEXAPRO) tablet 10 mg  10 mg Oral Daily Verlee Monte, MD   10 mg at 08/10/16 1020  . fluticasone (FLONASE) 50 MCG/ACT nasal spray 1 spray  1 spray Each Nare BID Verlee Monte, MD   1 spray at 08/10/16 2207  . gabapentin (NEURONTIN) capsule 300 mg  300 mg Oral TID Verlee Monte, MD   300 mg at 08/10/16 2207  . guaiFENesin (MUCINEX) 12 hr tablet 1,200 mg  1,200 mg Oral BID PRN Verlee Monte, MD   1,200 mg at 08/10/16 7062  . guaiFENesin-dextromethorphan (ROBITUSSIN DM) 100-10 MG/5ML syrup 15 mL  15 mL Oral Q4H PRN Verlee Monte, MD      . heparin injection 5,000 Units  5,000 Units Subcutaneous Q8H Verlee Monte, MD   5,000 Units at 08/11/16 0544  . HYDROcodone-acetaminophen (NORCO) 7.5-325 MG per tablet 1 tablet  1 tablet Oral BID PRN Verlee Monte, MD   1 tablet at 08/10/16 2207  . ibuprofen (ADVIL,MOTRIN) tablet 400 mg  400 mg Oral Q12H PRN Verlee Monte, MD      . ipratropium-albuterol (DUONEB) 0.5-2.5 (3) MG/3ML nebulizer solution 3 mL  3 mL Nebulization TID Aline August, MD   3 mL at 08/11/16 0757  . lip balm (CARMEX) ointment   Topical PRN Verlee Monte, MD      . menthol-cetylpyridinium (CEPACOL) lozenge 3 mg  1 lozenge Oral Q4H PRN Verlee Monte, MD      . mirabegron ER (MYRBETRIQ) tablet 50 mg  50 mg Oral Daily Verlee Monte, MD   50 mg at 08/10/16 1020  . montelukast (SINGULAIR) tablet 10 mg  10 mg Oral QHS Verlee Monte, MD   10 mg at 08/10/16 2207  . ondansetron  (ZOFRAN) tablet 4 mg  4 mg Oral Q6H PRN Verlee Monte, MD       Or  . ondansetron (ZOFRAN) injection 4 mg  4 mg Intravenous Q6H PRN Elmahi, Mutaz, MD      . polyethylene glycol (MIRALAX / GLYCOLAX) packet 17 g  17 g Oral Daily PRN Verlee Monte, MD      . polyvinyl alcohol (LIQUIFILM TEARS) 1.4 % ophthalmic solution 1 drop  1 drop Both Eyes BID Verlee Monte, MD   1 drop at 08/10/16 2207  . sodium chloride flush (NS) 0.9 % injection 3 mL  3 mL Intravenous Q12H Verlee Monte, MD   3 mL at 08/10/16  1030  . sodium phosphate (FLEET) 7-19 GM/118ML enema 1 enema  1 enema Rectal Once PRN Verlee Monte, MD         Discharge Medications: Please see discharge summary for a list of discharge medications.  Relevant Imaging Results:  Relevant Lab Results:   Additional Information SSN: [id number]  Lia Hopping, LCSW

## 2016-08-11 NOTE — Progress Notes (Signed)
PHARMACIST - PHYSICIAN ORDER COMMUNICATION  CONCERNING: P&T Medication Policy on Herbal Medications  DESCRIPTION:  This patient's order for:  Arnicare Arnica Cream has been noted.  This product(s) is classified as an "herbal" or natural product. Due to a lack of definitive safety studies or FDA approval, nonstandard manufacturing practices, plus the potential risk of unknown drug-drug interactions while on inpatient medications, the Pharmacy and Therapeutics Committee does not permit the use of "herbal" or natural products of this type within [facility].   ACTION TAKEN: The pharmacy department is unable to verify this order at this time and your patient has been informed of this safety policy. Please reevaluate patient's clinical condition at discharge and address if the herbal or natural product(s) should be resumed at that time.   Netta Cedars, PharmD, BCPS 08/11/2016@4 :43 AM

## 2016-08-11 NOTE — Care Management Note (Signed)
Case Management Note  Patient Details  Name: [name] MRN: [id number] Date of Birth: [birth year]  Subjective/Objective:  83 y/o f admitted w/Acute on chronic resp failure. Hx: w/c bound.Readmit-PNA. From ALF-[facility]. PT recc SNF. CSW already following for SNF.                  Action/Plan:d/c SNF.   Expected Discharge Date:                  Expected Discharge Plan:  Skilled Nursing Facility  In-House Referral:  Clinical Social Work  Discharge planning Services  CM Consult  Post Acute Care Choice:    Choice offered to:     DME Arranged:    DME Agency:     HH Arranged:    [place] Agency:     Status of Service:  In process, will continue to follow  If discussed at Long Length of Stay Meetings, dates discussed:    Additional Comments:  Dessa Phi, RN 08/11/2016, 11:13 AM

## 2016-08-11 NOTE — Progress Notes (Signed)
Patient ID: [name], female   DOB: 09/24/1930, 83 y.o.   MRN: [id number]  PROGRESS NOTE    [name]  VZD:[id number] DOB: 10-05-1930 DOA: 08/09/2016 PCP: Reynold Bowen, MD   Brief Narrative:  83 year old female with history of COPD, breast cancer, cervical spinal stenosis and pneumonia presented with cough and fever. She was admitted with diagnosis of bibasilar pneumonia and started on intravenous antibiotics and supplemental oxygen.  Assessment & Plan:   Principal Problem:   Acute on chronic respiratory failure with hypoxia (HCC) Active Problems:   Breast cancer, right breast (HCC)   Failed total hip arthroplasty ([place])   HCAP (healthcare-associated pneumonia)   Wheelchair bound   Wound healing, delayed   Acute hypoxemic respiratory failure ([place])  Hypoxia -Probably from pneumonia. Continue supplemental oxygen and wean off as able.  HCAP -With probable gram-negative rods; doubt that patient has MRSA pneumonia  -This is a third pneumonia on the last 6 months. - Cultures negative so far. She had temperature of 100.7 last night. Continue with Unasyn for now. -Diet as per recommendations from SLP; aspiration precautions.  History of right breast cancer -CT scan showed probable scar of the previous lumpectomy, follows outpatient.  Failed hip arthroplasty, she is wheelchair-bound -PT/OT to evaluate and treat, she lives in ALF  - Will need nursing home placement on discharge.  History of hypertension -Reported she was taken off of her blood pressure medications because of history of falls and dizziness. -Blood pressure on the higher side. We'll start metoprolol and monitor.   DVT prophylaxis:SQ Heparin Code Status:Full code Family Communication: none at bedside Disposition Plan:  nursing home in 1-2 days if condition improves Procedures: None  Antimicrobials: Cefepime and vancomycin: 08/09/2016-08/10/2016 Unasyn from 08/10/2016  onwards  Subjective: Patient seen and examined at bedside. She does not feel well. She complains of back and neck pain. No overnight nausea or vomiting.  Objective: Vitals:   08/10/16 1353 08/10/16 2107 08/11/16 0548 08/11/16 0758  BP: (!) 141/70 (!) 111/96 (!) 157/82   Pulse: 100 (!) 113 95   Resp: 18 18    Temp: 98.2 F (36.8 C) (!) 100.7 F (38.2 C) 99.3 F (37.4 C)   TempSrc: Oral Oral Oral   SpO2: 96% 96% 95% 92%  Weight:        Intake/Output Summary (Last 24 hours) at 08/11/16 1107 Last data filed at 08/11/16 0947  Gross per 24 hour  Intake              460 ml  Output                0 ml  Net              460 ml   Filed Weights   08/09/16 1147  Weight: 91.7 kg (202 lb 2.6 oz)    Examination:  General exam: Appears calm and comfortable  Respiratory system: Bilateral decreased breath sound at bases With scattered crackles Cardiovascular system: S1 & S2 heard, intermittent tachycardia  Gastrointestinal system: Abdomen is nondistended, soft and nontender. Normal bowel sounds heard. Extremities: No cyanosis, clubbing; trace pedal edema    Data Reviewed: I have personally reviewed following labs and imaging studies  CBC:  Recent Labs Lab 08/09/16 0114 08/10/16 0528 08/11/16 0725  WBC 12.3* 10.4 10.0  HGB 13.0 14.5 14.2  HCT 39.1 41.8 42.0  MCV 96.5 93.5 95.2  PLT 198 192 295   Basic Metabolic Panel:  Recent Labs Lab 08/09/16 0114 08/10/16  3664 08/11/16 0725  NA 139 135 136  K 4.1 3.7 3.5  CL 105 97* 97*  CO2 25 27 29   GLUCOSE 121* 104* 106*  BUN 19 13 15   CREATININE 0.66 0.58 0.57  CALCIUM 9.0 9.4 9.2  MG  --   --  1.7   GFR: Estimated Creatinine Clearance: 59.7 mL/min (by C-G formula based on SCr of 0.57 mg/dL). Liver Function Tests:  Recent Labs Lab 08/09/16 0114  AST 22  ALT 17  ALKPHOS 73  BILITOT 0.5  PROT 6.9  ALBUMIN 3.9   No results for input(s): LIPASE, AMYLASE in the last 168 hours. No results for input(s): AMMONIA in  the last 168 hours. Coagulation Profile:  Recent Labs Lab 08/09/16 1233  INR 1.07   Cardiac Enzymes:  Recent Labs Lab 08/09/16 0526  TROPONINI <0.03   BNP (last 3 results) No results for input(s): PROBNP in the last 8760 hours. HbA1C:  Recent Labs  08/09/16 1233  HGBA1C 5.6   CBG: No results for input(s): GLUCAP in the last 168 hours. Lipid Profile: No results for input(s): CHOL, HDL, LDLCALC, TRIG, CHOLHDL, LDLDIRECT in the last 72 hours. Thyroid Function Tests:  Recent Labs  08/09/16 1233  TSH 0.791   Anemia Panel: No results for input(s): VITAMINB12, FOLATE, FERRITIN, TIBC, IRON, RETICCTPCT in the last 72 hours. Sepsis Labs:  Recent Labs Lab 08/09/16 0826 08/09/16 1112  LATICACIDVEN 0.61 0.50    Recent Results (from the past 240 hour(s))  Urine culture     Status: None   Collection Time: 08/09/16  2:45 AM  Result Value Ref Range Status   Specimen Description URINE, RANDOM  Final   Special Requests NONE  Final   Culture   Final    NO GROWTH Performed at [facility], [address], [place], [place] [address]    Report Status 08/10/2016 FINAL  Final  Blood culture (routine x 2)     Status: None (Preliminary result)   Collection Time: 08/09/16  8:05 AM  Result Value Ref Range Status   Specimen Description BLOOD LEFT HAND  Final   Special Requests Blood Culture adequate volume  Final   Culture   Final    NO GROWTH < 24 HOURS Performed at [facility], [place] [address], [place], [place] [address]    Report Status PENDING  Incomplete  Blood culture (routine x 2)     Status: None (Preliminary result)   Collection Time: 08/09/16  8:15 AM  Result Value Ref Range Status   Specimen Description BLOOD RIGHT ARM  Final   Special Requests Blood Culture adequate volume  Final   Culture   Final    NO GROWTH < 24 HOURS Performed at [facility], [place] [address] [place], [place] [address]    Report Status PENDING  Incomplete  MRSA PCR  Screening     Status: None   Collection Time: 08/09/16  3:00 PM  Result Value Ref Range Status   MRSA by PCR NEGATIVE NEGATIVE Final    Comment:        The GeneXpert MRSA Assay (FDA approved for NASAL specimens only), is one component of a comprehensive MRSA colonization surveillance program. It is not intended to diagnose MRSA infection nor to guide or monitor treatment for MRSA infections.          Radiology Studies: Dg Esophagus  Result Date: 08/10/2016 CLINICAL DATA:  Coughing when eating. Sensation of food getting caught in  throat. EXAM: ESOPHOGRAM/BARIUM SWALLOW TECHNIQUE: Single contrast examination was performed using  thin barium. FLUOROSCOPY TIME:  Fluoroscopy Time:  1 minutes and 30 seconds. Radiation Exposure Index (if provided by the fluoroscopic device): 49 mGy Number of Acquired Spot Images: 0 COMPARISON:  CT scan 08/09/2016 FINDINGS: Given limited patient mobility and discomfort, limited single-contrast study was performed with the patient LPO and supine positions relative to the fluoro table. Assessment of the esophagus while the patient swallows thin barium shows no gross esophageal mass, diverticulum , or ulcer. No gross esophageal stricture. The patient did have week/mild coughing after some swallows, but assessment of the airway after these episodes revealed no barium within the trachea or mainstem bronchi. Tertiary contractions were noted in the mid and distal esophagus on all swallows with disruption of primary peristalsis. IMPRESSION: 1. No gross esophageal abnormality on this single contrast limited evaluation. 2. Nonspecific esophageal motility disorder with tertiary contractions in the mid and distal esophagus. No overt presbyesophagus. Electronically Signed   By: Misty Stanley M.D.   On: 08/10/2016 09:10        Scheduled Meds: . aspirin EC  325 mg Oral Daily  . buPROPion  300 mg Oral Daily  . calcium-vitamin D  1 tablet Oral Daily  . cholecalciferol   2,000 Units Oral Daily  . docusate sodium  100 mg Oral Daily  . escitalopram  10 mg Oral Daily  . fluticasone  1 spray Each Nare BID  . gabapentin  300 mg Oral TID  . heparin  5,000 Units Subcutaneous Q8H  . ipratropium-albuterol  3 mL Nebulization TID  . mirabegron ER  50 mg Oral Daily  . montelukast  10 mg Oral QHS  . polyvinyl alcohol  1 drop Both Eyes BID  . sodium chloride flush  3 mL Intravenous Q12H   Continuous Infusions: . ampicillin-sulbactam (UNASYN) IV 3 g (08/11/16 0545)     LOS: 2 days        Aline August, MD [facility] Pager [phone]  If 7PM-7AM, please contact night-coverage [email or web address] Password [facility] 08/11/2016, 11:07 AM

## 2016-08-11 NOTE — Progress Notes (Signed)
PT note recommends SNF before patient returns to ALF. Patient daughter interested in [facility] [place], csw confirmed patient has bed. Patient daughter plans to fill out paper work at [facility] in the a.m.   Kathrin Greathouse, Latanya Presser, MSW Clinical Social Worker 5E and Psychiatric Service Line [phone] 08/11/2016  11:26 AM

## 2016-08-12 ENCOUNTER — Inpatient Hospital Stay (HOSPITAL_COMMUNITY): Payer: Medicare Other

## 2016-08-12 LAB — BASIC METABOLIC PANEL
ANION GAP: 8 (ref 5–15)
BUN: 16 mg/dL (ref 6–20)
CHLORIDE: 97 mmol/L — AB (ref 101–111)
CO2: 30 mmol/L (ref 22–32)
Calcium: 9.2 mg/dL (ref 8.9–10.3)
Creatinine, Ser: 0.54 mg/dL (ref 0.44–1.00)
GFR calc non Af Amer: >60 mL/min (ref >60)
Glucose, Bld: 101 mg/dL — ABNORMAL HIGH (ref 65–99)
POTASSIUM: 3.3 mmol/L — AB (ref 3.5–5.1)
SODIUM: 135 mmol/L (ref 135–145)

## 2016-08-12 LAB — CBC
HCT: 40.4 % (ref 36.0–46.0)
Hemoglobin: 13.6 g/dL (ref 12.0–15.0)
MCH: 32 pg (ref 26.0–34.0)
MCHC: 33.7 g/dL (ref 30.0–36.0)
MCV: 95.1 fL (ref 78.0–100.0)
Platelets: 202 10*3/uL (ref 150–400)
RBC: 4.25 MIL/uL (ref 3.87–5.11)
RDW: 13.7 % (ref 11.5–15.5)
WBC: 7.8 10*3/uL (ref 4.0–10.5)

## 2016-08-12 LAB — MAGNESIUM: Magnesium: 1.9 mg/dL (ref 1.7–2.4)

## 2016-08-12 MED ORDER — AMOXICILLIN-POT CLAVULANATE 875-125 MG PO TABS: 1.0000 | ORAL_TABLET | Freq: Two times a day (BID) | ORAL | 0 refills | Status: AC

## 2016-08-12 MED ORDER — CLONAZEPAM 1 MG PO TABS: 1.0000 mg | ORAL_TABLET | Freq: Every evening | ORAL | 0 refills | Status: DC | PRN

## 2016-08-12 MED ORDER — METOPROLOL TARTRATE 25 MG PO TABS: 12.5000 mg | ORAL_TABLET | Freq: Two times a day (BID) | ORAL | 0 refills | Status: AC

## 2016-08-12 MED ORDER — FLORANEX PO PACK: 1.0000 g | PACK | Freq: Three times a day (TID) | ORAL | 0 refills | Status: DC

## 2016-08-12 MED ORDER — IPRATROPIUM-ALBUTEROL 0.5-2.5 (3) MG/3ML IN SOLN: 3.0000 mL | Freq: Two times a day (BID) | RESPIRATORY_TRACT | Status: DC

## 2016-08-12 MED ORDER — HYDROCODONE-ACETAMINOPHEN 7.5-325 MG PO TABS: 1.0000 | ORAL_TABLET | Freq: Two times a day (BID) | ORAL | 0 refills | Status: DC | PRN

## 2016-08-12 MED ORDER — POTASSIUM CHLORIDE CRYS ER 20 MEQ PO TBCR
40.0000 meq | EXTENDED_RELEASE_TABLET | ORAL | Status: AC
  Administered 2016-08-12 (×2): 40 meq via ORAL
  Filled 2016-08-12 (×2): qty 2

## 2016-08-12 NOTE — Progress Notes (Signed)
Iv removed. AVS printed for facility. Belongings gathered. [facility] arrived to transport patient to [facility]. Report called to [facility] at this time.

## 2016-08-12 NOTE — Clinical Social Work Placement (Signed)
   CLINICAL SOCIAL WORK PLACEMENT  NOTE  Date:  08/12/2016  Patient Details  Name: [name] MRN: [id number] Date of Birth: [birth year]  Clinical Social Work is seeking post-discharge placement for this patient at the [place] level of care (*CSW will initial, date and re-position this form in  chart as items are completed):  Yes   Patient/family provided with [place] Work Department's list of facilities offering this level of care within the geographic area requested by the patient (or if unable, by the patient's family).  Yes   Patient/family informed of their freedom to choose among providers that offer the needed level of care, that participate in Medicare, Medicaid or managed care program needed by the patient, have an available bed and are willing to accept the patient.  Yes   Patient/family informed of [facility]'s ownership interest in [facility] and [facility], as well as of the fact that they are under no obligation to receive care at these facilities.  PASRR submitted to EDS on       PASRR number received on       Existing PASRR number confirmed on 08/11/16     FL2 transmitted to all facilities in geographic area requested by pt/family on 08/11/16     FL2 transmitted to all facilities within larger geographic area on 08/12/16     Patient informed that his/her managed care company has contracts with or will negotiate with certain facilities, including the following:  [facility]     Yes   Patient/family informed of bed offers received.  Patient chooses bed at [facility]     Physician recommends and patient chooses bed at      Patient to be transferred to [facility] on 08/12/16.  Patient to be transferred to facility by [facility]     Patient family notified on 08/12/16 of transfer.  Name of family member notified:  Daughter-Ruth     PHYSICIAN       Additional  Comment:    _______________________________________________ Lia Hopping, LCSW 08/12/2016, 11:10 AM

## 2016-08-12 NOTE — Progress Notes (Signed)
[facility] called for transport. Nurse given transport packet and number for report.   Kathrin Greathouse, Latanya Presser, MSW Clinical Social Worker 5E and Psychiatric Service Line [phone] 08/12/2016  2:24 PM

## 2016-08-12 NOTE — Care Management Note (Signed)
Case Management Note  Patient Details  Name: [name] MRN: [id number] Date of Birth: 05-Apr-1930  Subjective/Objective:PT recc SNF. From ALF-CSW already managing d/c SNF.                    Action/Plan:d/c SNF.   Expected Discharge Date:  08/12/16               Expected Discharge Plan:  Skilled Nursing Facility  In-House Referral:  Clinical Social Work  Discharge planning Services  CM Consult  Post Acute Care Choice:    Choice offered to:     DME Arranged:    DME Agency:     HH Arranged:    [place] Agency:     Status of Service:  Completed, signed off  If discussed at [employer], dates discussed:    Additional Comments:  Dessa Phi, RN 08/12/2016, 12:58 PM

## 2016-08-12 NOTE — Discharge Summary (Signed)
Discharge Summary  [name] JXB:[id number] DOB: 06-14-1930  PCP: Reynold Bowen, MD  Admit date: 08/09/2016 Discharge date: 08/12/2016  Time spent: >39mins  Recommendations for Outpatient Follow-up:  1. F/u with MD at [facility]  for hospital discharge follow up, repeat cbc/bmp at follow up 2. Follow up with pulmonology for recurrent pneumonia 3. pmd or snf MD to schedule patient for mammogram  Discharge Diagnoses:  Active Hospital Problems   Diagnosis Date Noted  . Acute on chronic respiratory failure with hypoxia ([place]) 08/09/2016  . Wheelchair bound 08/09/2016  . Wound healing, delayed 08/09/2016  . Acute hypoxemic respiratory failure ([place]) 08/09/2016  . HCAP (healthcare-associated pneumonia) 07/07/2016  . Failed total hip arthroplasty ([place]) 07/10/2015  . Breast cancer, right breast ([place]) 07/18/2014    Resolved Hospital Problems   Diagnosis Date Noted Date Resolved  No resolved problems to display.    Discharge Condition: stable  Diet recommendation: heart healthy  Filed Weights   08/09/16 1147  Weight: 91.7 kg (202 lb 2.6 oz)    History of present illness:  PCP: Reynold Bowen, MD  Patient coming from: [facility]  Chief Complaint: SOB  HPI: [name] is a 83 y.o. female with medical history significant of COPD, history of breast cancer, cervical spinal stenosis. She came to the hospital because of cough and fever. Patient admitted twice in the past 6 months in March and May for pneumonia. Given this time with the same complaints, shortness of breath, cough with minimal sputum production, subjective fevers and feeling very weak. She denies palpitations, denies hemoptysis. Initial evaluation in the ED with CXR was negative so CT angiography was obtained and showed bibasilar pneumonia. Patient desats of 87% on room air.  She is a retired Investment banker, operational, she is wheelchair-bound secondary to failed hip arthroplasty, lives in the [place]  with her husband, when I interviewed her son was at bedside.  ED Course:  Vitals: Hypoxia with O2 sats of 87% on room air. Labs: WNL except WBC of 12.3. Imaging: CT scan showed bibasilar nodules suggesting acute infection, no PE. Interventions:  Hospital Course:  Principal Problem:   Acute on chronic respiratory failure with hypoxia (HCC) Active Problems:   Breast cancer, right breast (HCC)   Failed total hip arthroplasty ([place])   HCAP (healthcare-associated pneumonia)   Wheelchair bound   Wound healing, delayed   Acute hypoxemic respiratory failure ([place])    HCAP with acute hypoxia respiratory failure -This is a third pneumonia on the last 6 months. -cta chest on admission negative for PE, did show "Bibasilar nodules with a peripheral ground-glass opacities suggest foci of acute pulmonary infection." - cough does not seem to be productive, no sputum collected during this hospitalization, blood Culture and urine cculture negative so far.  mrsa screening negative -regular diet /thin liquid per speech eval -she did have fever, leukocytosis, wbc 12.3 on admission, she is started on vanc/cefepime initially, this has changed to unasyn,  -she is improving, no fever the last 24hrs, leukocytosis has normalized, she is weaned off oxygen, she is discharged on augmentin for four more days to finish total of 7 days abx treatment She is to follow with pulmonology due to recurrent pneumonia. She is advised to use incentive spirometer when awake.  History of right breast cancer -on ct chest "Nodular density in the deep RIGHT breast tissue versus axilla. Favor scar from lumpectomy 2005. If concern for breast cancer recommend mammogram." -outpatient follow up  Failed hip arthroplasty, she  is wheelchair-bound -PT/OT to evaluate and treat, she lives in [place] home placement on discharge.  History of hypertension -Reported she was taken off of her blood pressure medications because of  history of falls and dizziness. -Blood pressure on the higher side. She is restarted on metoprolol -she is discharged on lopressor 12.5bid, continue to monitor   DVT prophylaxis:SQ Heparin Code Status:Full code Family Communication:none at bedside Disposition Plan: SNF Procedures:None  Antimicrobials:  Cefepime and vancomycin: 08/09/2016-08/10/2016 Unasyn from 08/10/2016 onwards   Discharge Exam: BP 126/68 (BP Location: Right Arm)   Pulse 80   Temp 99.3 F (37.4 C) (Oral)   Resp 20   Wt 91.7 kg (202 lb 2.6 oz)   SpO2 95%   BMI 32.63 kg/m   General: frail, chronically ill appearing, aaox3 Cardiovascular: RRR Respiratory: diminished at basis, no rales, no rhonchi, no wheezing Extremity: chronic venous stasis changes, no edema  Discharge Instructions You were cared for by a hospitalist during your hospital stay. If you have any questions about your discharge medications or the care you received while you were in the hospital after you are discharged, you can call the unit and asked to speak with the hospitalist on call if the hospitalist that took care of you is not available. Once you are discharged, your primary care physician will handle any further medical issues. Please note that NO REFILLS for any discharge medications will be authorized once you are discharged, as it is imperative that you return to your primary care physician (or establish a relationship with a primary care physician if you do not have one) for your aftercare needs so that they can reassess your need for medications and monitor your lab values.  Discharge Instructions    Diet - low sodium heart healthy    Complete by:  As directed    Increase activity slowly    Complete by:  As directed      Allergies as of 08/12/2016      Reactions   Oxycodone Hcl [oxycodone Hcl] Rash   Morphine Sulfate Other (See Comments)   REACTION: very anxious   Sulfamethoxazole Other (See Comments)   REACTION:  unspecified      Medication List    STOP taking these medications   predniSONE 20 MG tablet Commonly known as:  DELTASONE     TAKE these medications   acetaminophen 325 MG tablet Commonly known as:  TYLENOL Take 2 tablets (650 mg total) by mouth every 6 (six) hours as needed for mild pain (or Fever >/= 101). What changed:  Another medication with the same name was removed. Continue taking this medication, and follow the directions you see here.   ADVIL 200 MG Caps Generic drug:  Ibuprofen Take 400 mg by mouth every 12 (twelve) hours as needed (pain).   amoxicillin-clavulanate 875-125 MG tablet Commonly known as:  AUGMENTIN Take 1 tablet by mouth every 12 (twelve) hours.   ARNICARE ARNICA Crea Apply 1 application topically as needed (muscle pain/stiffness).   aspirin EC 325 MG tablet Take 325 mg by mouth daily.   benzonatate 200 MG capsule Commonly known as:  TESSALON Take 1 capsule (200 mg total) by mouth 3 (three) times daily as needed for cough.   buPROPion 300 MG 24 hr tablet Commonly known as:  WELLBUTRIN XL Take 300 mg by mouth every morning.   CALCIUM+D3 600-800 MG-UNIT Tabs Generic drug:  Calcium Carb-Cholecalciferol Take 1 tablet by mouth daily.   clonazePAM 1 MG tablet  Commonly known as:  KLONOPIN Take 1 tablet (1 mg total) by mouth at bedtime as needed (insomnia).   docusate sodium 100 MG capsule Commonly known as:  COLACE Take 100 mg by mouth daily.   escitalopram 10 MG tablet Commonly known as:  LEXAPRO Take 10 mg by mouth daily.   fluticasone 50 MCG/ACT nasal spray Commonly known as:  FLONASE Place 1 spray into both nostrils 2 (two) times daily.   gabapentin 300 MG capsule Commonly known as:  NEURONTIN Take 1 capsule (300 mg total) by mouth 3 (three) times daily.   guaiFENesin 600 MG 12 hr tablet Commonly known as:  MUCINEX Take 600 mg by mouth 2 (two) times daily as needed for cough or to loosen phlegm.   guaiFENesin-dextromethorphan  100-10 MG/5ML syrup Commonly known as:  ROBITUSSIN DM Take 15 mLs by mouth every 4 (four) hours as needed for cough.   HYDROcodone-acetaminophen 7.5-325 MG tablet Commonly known as:  NORCO Take 1 tablet by mouth 2 (two) times daily as needed for moderate pain or severe pain. What changed:  Another medication with the same name was removed. Continue taking this medication, and follow the directions you see here.   ipratropium-albuterol 0.5-2.5 (3) MG/3ML Soln Commonly known as:  DUONEB Take 3 mLs by nebulization 2 (two) times daily.   lactobacillus Pack Take 1 packet (1 g total) by mouth 3 (three) times daily with meals.   menthol-cetylpyridinium 3 MG lozenge Commonly known as:  CEPACOL Take 1 lozenge by mouth every 4 (four) hours as needed for sore throat.   methocarbamol 500 MG tablet Commonly known as:  ROBAXIN Take 1 tablet (500 mg total) by mouth every 6 (six) hours as needed for muscle spasms.   metoprolol tartrate 25 MG tablet Commonly known as:  LOPRESSOR Take 0.5 tablets (12.5 mg total) by mouth 2 (two) times daily.   montelukast 10 MG tablet Commonly known as:  SINGULAIR Take 10 mg by mouth at bedtime.   mupirocin ointment 2 % Commonly known as:  BACTROBAN Place 1 application into the nose 2 (two) times daily. Use through 05/14/2016   MYRBETRIQ 25 MG Tb24 tablet Generic drug:  mirabegron ER Take 50 mg by mouth daily.   ondansetron 4 MG tablet Commonly known as:  ZOFRAN Take 4 mg by mouth every 8 (eight) hours as needed for nausea or vomiting.   polyethylene glycol packet Commonly known as:  MIRALAX / GLYCOLAX Take 17 g by mouth daily as needed for mild constipation.   albuterol (2.5 MG/3ML) 0.083% nebulizer solution Commonly known as:  PROVENTIL Take 2.5 mg by nebulization every 4 (four) hours as needed for wheezing or shortness of breath.   PROAIR HFA 108 (90 Base) MCG/ACT inhaler Generic drug:  albuterol Inhale 2 puffs into the lungs every 4 (four)  hours as needed for shortness of breath.   Propylene Glycol-Glycerin 0.6-0.6 % Soln Commonly known as:  SOOTHE Apply 1 drop to eye 2 (two) times daily.   sodium phosphate 7-19 GM/118ML Enem Place 133 mLs (1 enema total) rectally once as needed for severe constipation.   Vitamin D 2000 units tablet Take 2,000 Units by mouth daily.      Allergies  Allergen Reactions  . Oxycodone Hcl [Oxycodone Hcl] Rash  . Morphine Sulfate Other (See Comments)    REACTION: very anxious  . Sulfamethoxazole Other (See Comments)    REACTION: unspecified   Follow-up Information    [facility] Follow up in 3 week(s).  Specialty:  Pulmonology Why:  recurrent pneumonia Contact information: [place] [phone]           The results of significant diagnostics from this hospitalization (including imaging, microbiology, ancillary and laboratory) are listed below for reference.    Significant Diagnostic Studies: Dg Chest 2 View  Result Date: 08/12/2016 CLINICAL DATA:  Dyspnea, history hypertension, COPD, breast cancer, diabetes mellitus EXAM: CHEST  2 VIEW COMPARISON:  08/09/2016 FINDINGS: Normal heart size, mediastinal contours, and pulmonary vascularity. Bronchitic changes and hyperinflation compatible with history of COPD. Bibasilar atelectasis. No definite infiltrate, pleural effusion or pneumothorax. Diffuse osseous demineralization with evidence of prior cervical spine fusion. IMPRESSION: COPD changes with bibasilar atelectasis. Emphysema (ICD10-J43.9). Electronically Signed   By: Lavonia Dana M.D.   On: 08/12/2016 08:23   Dg Chest 2 View  Result Date: 08/09/2016 CLINICAL DATA:  Generalized weakness. Assess for pneumonia. History of RIGHT breast cancer, hypertension and asthma. EXAM: CHEST  2 VIEW COMPARISON:  Chest radiograph Jul 09, 2016 FINDINGS: Cardiomediastinal silhouette is normal. Mildly calcified aortic knob. No pleural effusions or  focal consolidations. RIGHT lung base strandy densities. Trachea projects midline and there is no pneumothorax. Soft tissue planes and included osseous structures are non-suspicious. ACDF. IMPRESSION: RIGHT lung base atelectasis. Electronically Signed   By: Elon Alas M.D.   On: 08/09/2016 03:19   Ct Angio Chest Pe W And/or Wo Contrast  Result Date: 08/09/2016 CLINICAL DATA:  complaints of generalized weakness. Facility and pt's husband stated "she is just not her self". AANDO x4. Pt unable to move per baseline. Ronchi noted in lower lobes. 2 L [place] on arrival due to desaturation. (Sat = 89 on arrival). Concern pulmonary embolism. EXAM: CT ANGIOGRAPHY CHEST WITH CONTRAST TECHNIQUE: Multidetector CT imaging of the chest was performed using the standard protocol during bolus administration of intravenous contrast. Multiplanar CT image reconstructions and MIPs were obtained to evaluate the vascular anatomy. CONTRAST:  100 mL Isovue COMPARISON:  None. FINDINGS: Cardiovascular: Poor concentration of the contrast bolus. No evidence of pulmonary embolism in proximal pulmonary arteries. No gross evidence of pulmonary embolism in distal segmental pulmonary arteries. No acute findings of the aorta great vessels. Mediastinum/Nodes: Nodularity in these deep breast tissue/axilla on the RIGHT measuring 2.0 cm (image 53, series 5). No supraclavicular adenopathy. No mediastinal adenopathy. No pericardial fluid. Esophagus normal Lungs/Pleura: There is are bilateral pulmonary nodules with peripheral ground-glass opacity suggesting foci of infection (image 53, 61 and 40 of the series 12). These predominantly in the lower lobes and inferior lingula. Upper Abdomen: Limited view of the liver, kidneys, pancreas are unremarkable. Normal adrenal glands. Musculoskeletal: No aggressive osseous lesion. Review of the MIP images confirms the above findings. IMPRESSION: 1. No evidence acute pulmonary embolism on suboptimal exam. 2.  Bibasilar nodules with a peripheral ground-glass opacities suggest foci of acute pulmonary infection. 3. Nodular density in the deep RIGHT breast tissue versus axilla. Favor scar from lumpectomy 2005. If concern for breast cancer recommend mammogram. Electronically Signed   By: Suzy Bouchard M.D.   On: 08/09/2016 09:11   Dg Esophagus  Result Date: 08/10/2016 CLINICAL DATA:  Coughing when eating. Sensation of food getting caught in throat. EXAM: ESOPHOGRAM/BARIUM SWALLOW TECHNIQUE: Single contrast examination was performed using  thin barium. FLUOROSCOPY TIME:  Fluoroscopy Time:  1 minutes and 30 seconds. Radiation Exposure Index (if provided by the fluoroscopic device): 49 mGy Number of Acquired Spot Images: 0 COMPARISON:  CT scan 08/09/2016 FINDINGS: Given limited patient  mobility and discomfort, limited single-contrast study was performed with the patient LPO and supine positions relative to the fluoro table. Assessment of the esophagus while the patient swallows thin barium shows no gross esophageal mass, diverticulum , or ulcer. No gross esophageal stricture. The patient did have week/mild coughing after some swallows, but assessment of the airway after these episodes revealed no barium within the trachea or mainstem bronchi. Tertiary contractions were noted in the mid and distal esophagus on all swallows with disruption of primary peristalsis. IMPRESSION: 1. No gross esophageal abnormality on this single contrast limited evaluation. 2. Nonspecific esophageal motility disorder with tertiary contractions in the mid and distal esophagus. No overt presbyesophagus. Electronically Signed   By: Misty Stanley M.D.   On: 08/10/2016 09:10    Microbiology: Recent Results (from the past 240 hour(s))  Urine culture     Status: None   Collection Time: 08/09/16  2:45 AM  Result Value Ref Range Status   Specimen Description URINE, RANDOM  Final   Special Requests NONE  Final   Culture   Final    NO  GROWTH Performed at [facility], [address] [place], [place] [address]    Report Status 08/10/2016 FINAL  Final  Blood culture (routine x 2)     Status: None (Preliminary result)   Collection Time: 08/09/16  8:05 AM  Result Value Ref Range Status   Specimen Description BLOOD LEFT HAND  Final   Special Requests Blood Culture adequate volume  Final   Culture   Final    NO GROWTH 2 DAYS Performed at [facility], [place] [address] [place], [place] [address]    Report Status PENDING  Incomplete  Blood culture (routine x 2)     Status: None (Preliminary result)   Collection Time: 08/09/16  8:15 AM  Result Value Ref Range Status   Specimen Description BLOOD RIGHT ARM  Final   Special Requests Blood Culture adequate volume  Final   Culture   Final    NO GROWTH 2 DAYS Performed at [facility], [place] [address] [place], [place] [address]    Report Status PENDING  Incomplete  MRSA PCR Screening     Status: None   Collection Time: 08/09/16  3:00 PM  Result Value Ref Range Status   MRSA by PCR NEGATIVE NEGATIVE Final    Comment:        The GeneXpert MRSA Assay (FDA approved for NASAL specimens only), is one component of a comprehensive MRSA colonization surveillance program. It is not intended to diagnose MRSA infection nor to guide or monitor treatment for MRSA infections.      Labs: Basic Metabolic Panel:  Recent Labs Lab 08/09/16 0114 08/10/16 0528 08/11/16 0725 08/12/16 0451  NA 139 135 136 135  K 4.1 3.7 3.5 3.3*  CL 105 97* 97* 97*  CO2 25 27 29 30   GLUCOSE 121* 104* 106* 101*  BUN 19 13 15 16   CREATININE 0.66 0.58 0.57 0.54  CALCIUM 9.0 9.4 9.2 9.2  MG  --   --  1.7 1.9   Liver Function Tests:  Recent Labs Lab 08/09/16 0114  AST 22  ALT 17  ALKPHOS 73  BILITOT 0.5  PROT 6.9  ALBUMIN 3.9   No results for input(s): LIPASE, AMYLASE in the last 168 hours. No results for input(s): AMMONIA in the last 168 hours. CBC:  Recent  Labs Lab 08/09/16 0114 08/10/16 0528 08/11/16 0725 08/12/16 0451  WBC 12.3* 10.4 10.0 7.8  HGB 13.0 14.5 14.2 13.6  HCT 39.1 41.8 42.0 40.4  MCV 96.5 93.5 95.2 95.1  PLT 198 192 207 202   Cardiac Enzymes:  Recent Labs Lab 08/09/16 0526  TROPONINI <0.03   BNP: BNP (last 3 results)  Recent Labs  07/07/16 0844  BNP 103.1*    ProBNP (last 3 results) No results for input(s): PROBNP in the last 8760 hours.  CBG: No results for input(s): GLUCAP in the last 168 hours.     SignedFlorencia Reasons MD, PhD  [facility] 08/12/2016, 12:50 PM

## 2016-08-14 LAB — CULTURE, BLOOD (ROUTINE X 2)
CULTURE: NO GROWTH
Culture: NO GROWTH
Special Requests: ADEQUATE
Special Requests: ADEQUATE

## 2016-08-24 ENCOUNTER — Other Ambulatory Visit (HOSPITAL_COMMUNITY): Payer: Self-pay | Admitting: Internal Medicine

## 2016-08-24 DIAGNOSIS — R1310 Dysphagia, unspecified: Secondary | ICD-10-CM

## 2016-09-01 ENCOUNTER — Institutional Professional Consult (permissible substitution): Payer: Medicare Other | Admitting: Internal Medicine

## 2016-09-01 ENCOUNTER — Ambulatory Visit (INDEPENDENT_AMBULATORY_CARE_PROVIDER_SITE_OTHER): Payer: Medicare Other | Admitting: Internal Medicine

## 2016-09-01 ENCOUNTER — Encounter: Payer: Self-pay | Admitting: Internal Medicine

## 2016-09-01 ENCOUNTER — Ambulatory Visit (HOSPITAL_COMMUNITY): Admission: RE | Admit: 2016-09-01 | Payer: Medicare Other | Source: Ambulatory Visit

## 2016-09-01 ENCOUNTER — Inpatient Hospital Stay (HOSPITAL_COMMUNITY): Admission: RE | Admit: 2016-09-01 | Payer: Medicare Other | Source: Ambulatory Visit

## 2016-09-01 VITALS — BP 130/76 | HR 74 | Ht 66.0 in | Wt 187.4 lb

## 2016-09-01 DIAGNOSIS — K224 Dyskinesia of esophagus: Secondary | ICD-10-CM

## 2016-09-01 DIAGNOSIS — J4530 Mild persistent asthma, uncomplicated: Secondary | ICD-10-CM

## 2016-09-01 MED ORDER — MOMETASONE FURO-FORMOTEROL FUM 100-5 MCG/ACT IN AERO: 2.0000 | INHALATION_SPRAY | Freq: Two times a day (BID) | RESPIRATORY_TRACT | 0 refills | Status: DC

## 2016-09-01 NOTE — Patient Instructions (Signed)
Plan A = Automatic = dulera 100  Take 2 puffs first thing in am and then another 2 puffs about 12 hours later.   Work on inhaler technique:  relax and gently blow all the way out then take a nice smooth deep breath back in, triggering the inhaler at same time you start breathing in.  Hold for up to 5 seconds if you can. Blow out thru nose. Rinse and gargle with water when done   Plan B = Backup Only use your albuterol (proventilred  or proair/yellow) as a rescue medication to be used if you can't catch your breath by resting or doing a relaxed purse lip breathing pattern.  - The less you use it, the better it will work when you need it. - Ok to use the inhaler up to 2 puffs  every 4 hours if you must but call for appointment if use goes up over your usual need - Don't leave home without it !!  (think of it like the spare tire for your car)   Plan C = Crisis - only use your albuterol nebulizer if you first try Plan B and it fails to help > ok to use the nebulizer up to every 4 hours but if start needing it regularly call for immediate appointment  Please see patient coordinator before you leave today  to schedule GI evaluation with Dr Nyoka Cowden

## 2016-09-01 NOTE — Progress Notes (Signed)
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 Better  Then much worse p moved into [facility] Jan 2017   May 2017  R hip surgery > wheelchair-bound secondary to failed hip arthroplasty and downhill since much less mobile and tendency to ?  Asp pna  Admit date: 08/09/2016 Discharge date: 08/12/2016     Recommendations for Outpatient Follow-up:  1. F/u with MD at [facility]  for hospital discharge follow up, repeat cbc/bmp at follow up 2. Follow up with pulmonology for recurrent pneumonia 3. pmd or snf MD to schedule patient for mammogram  Discharge Diagnoses:       Active Hospital Problems   Diagnosis Date Noted  . Acute on chronic respiratory failure with hypoxia ([place]) 08/09/2016  . Wheelchair bound 08/09/2016  . Wound healing, delayed 08/09/2016  . Acute hypoxemic respiratory failure ([place]) 08/09/2016  . HCAP (healthcare-associated pneumonia) 07/07/2016  . Failed total hip arthroplasty ([place]) 07/10/2015  . Breast cancer, right breast ([place]) 07/18/2014    Resolved Hospital Problems   Diagnosis Date Noted Date Resolved  No resolved problems to display.    Discharge Condition: stable  Diet recommendation: heart healthy     Filed Weights   08/09/16 1147  Weight: 91.7 kg (202 lb 2.6 oz)    History of present illness:  PCP: Reynold Bowen, MD Patient coming from: [facility]  Chief Complaint: SOB  HPI: [name] a 83 y.o.femalewith medical history significant of COPD, history of breast cancer, cervical spinal stenosis. She came to the hospital because of cough and fever. Patient admitted twice in the past 6 months in March  and May forpneumonia. Given this time with the same complaints, shortness of breath, cough with minimal sputum production, subjective fevers and feeling very weak. She denies palpitations, denies hemoptysis. Initial evaluation in the ED with CXR was negative so CT angiography was obtained and showed bibasilar pneumonia. Patient desats of 87% on room air.  She is a retired Investment banker, operational, she is wheelchair-bound secondary to failed hip arthroplasty, lives in the [place] with her husband  ED Course: Vitals: Hypoxia with O2 sats of 87% on room air. Labs:WNL except WBC of 12.3. Imaging:CT scan showed bibasilar nodules suggesting acute infection, no PE. Interventions:  Hospital Course:  Principal Problem:   Acute on chronic respiratory failure with hypoxia (HCC) Active Problems:   Breast cancer, right breast (HCC)   Failed total hip arthroplasty ([place])   HCAP (healthcare-associated pneumonia)   Wheelchair bound   Wound healing, delayed   Acute hypoxemic respiratory failure ([place])    HCAP with acute hypoxia respiratory failure -This is a third pneumonia on the last 6 months. -cta chest on admission negative for PE, did show "Bibasilar nodules with a peripheral ground-glass opacities suggest foci of acute pulmonary infection." - cough does not seem to be productive, no sputum collected during this hospitalization, blood Culture and urine cculture negative so far.  mrsa screening negative -regular diet /thin liquid per speech eval -she did have fever, leukocytosis, wbc 12.3 on admission, she is started on vanc/cefepime initially, this  has changed to unasyn,  -she is improving, no fever the last 24hrs, leukocytosis has normalized, she is weaned off oxygen, she is discharged on augmentin for four more days to finish total of 7 days abx treatment She is to follow with pulmonology due to recurrent pneumonia. She is advised to use incentive spirometer when  awake.  History of right breast cancer -on ct chest "Nodular density in the deep RIGHT breast tissue versus axilla. Favor scar from lumpectomy 2005. If concern for breast cancer recommend mammogram." -outpatient follow up  Failed hip arthroplasty, she is wheelchair-bound -PT/OT to evaluate and treat, she lives in [facility] home placement on discharge.  History of hypertension -Reported she was taken off of her blood pressure medications because of history of falls and dizziness. -Blood pressure on the higher side. She is restarted on metoprolol -she is discharged on lopressor 12.5bid, continue to monitor   DVT prophylaxis:SQ Heparin Code Status:Full code Family Communication:none at bedside Disposition Plan:SNF Procedures:None  Antimicrobials:  Cefepime and vancomycin: 08/09/2016-08/10/2016 Unasyn from 08/10/2016 onwards   Discharge Exam: BP 126/68 (BP Location: Right Arm)   Pulse 80   Temp 99.3 F (37.4 C) (Oral)   Resp 20   Wt 91.7 kg (202 lb 2.6 oz)   SpO2 95%   BMI 32.63 kg/m   General: frail, chronically ill appearing, aaox3 Cardiovascular: RRR Respiratory: diminished at basis, no rales, no rhonchi, no wheezing Extremity: chronic venous stasis changes, no edema      Discharge Instructions    Diet - low sodium heart healthy    Complete by:  As directed    Increase activity slowly    Complete by:  As directed          Allergies as of 08/12/2016      Reactions   Oxycodone Hcl [oxycodone Hcl] Rash   Morphine Sulfate Other (See Comments)   REACTION: very anxious   Sulfamethoxazole Other (See Comments)   REACTION: unspecified         Medication List    STOP taking these medications   predniSONE 20 MG tablet Commonly known as:  DELTASONE     TAKE these medications   acetaminophen 325 MG tablet Commonly known as:  TYLENOL Take 2 tablets (650 mg total) by mouth every 6 (six) hours as needed for mild pain (or  Fever >/= 101). What changed:  Another medication with the same name was removed. Continue taking this medication, and follow the directions you see here.   ADVIL 200 MG Caps Generic drug:  Ibuprofen Take 400 mg by mouth every 12 (twelve) hours as needed (pain).   amoxicillin-clavulanate 875-125 MG tablet Commonly known as:  AUGMENTIN Take 1 tablet by mouth every 12 (twelve) hours.   ARNICARE ARNICA Crea Apply 1 application topically as needed (muscle pain/stiffness).   aspirin EC 325 MG tablet Take 325 mg by mouth daily.   benzonatate 200 MG capsule Commonly known as:  TESSALON Take 1 capsule (200 mg total) by mouth 3 (three) times daily as needed for cough.   buPROPion 300 MG 24 hr tablet Commonly known as:  WELLBUTRIN XL Take 300 mg by mouth every morning.   CALCIUM+D3 600-800 MG-UNIT Tabs Generic drug:  Calcium Carb-Cholecalciferol Take 1 tablet by mouth daily.   clonazePAM 1 MG tablet Commonly known as:  KLONOPIN Take 1 tablet (1 mg total) by mouth at bedtime as needed (insomnia).   docusate sodium 100 MG capsule Commonly known as:  COLACE Take 100  mg by mouth daily.   escitalopram 10 MG tablet Commonly known as:  LEXAPRO Take 10 mg by mouth daily.   fluticasone 50 MCG/ACT nasal spray Commonly known as:  FLONASE Place 1 spray into both nostrils 2 (two) times daily.   gabapentin 300 MG capsule Commonly known as:  NEURONTIN Take 1 capsule (300 mg total) by mouth 3 (three) times daily.   guaiFENesin 600 MG 12 hr tablet Commonly known as:  MUCINEX Take 600 mg by mouth 2 (two) times daily as needed for cough or to loosen phlegm.   guaiFENesin-dextromethorphan 100-10 MG/5ML syrup Commonly known as:  ROBITUSSIN DM Take 15 mLs by mouth every 4 (four) hours as needed for cough.   HYDROcodone-acetaminophen 7.5-325 MG tablet Commonly known as:  NORCO Take 1 tablet by mouth 2 (two) times daily as needed for moderate pain or severe pain. What  changed:  Another medication with the same name was removed. Continue taking this medication, and follow the directions you see here.   ipratropium-albuterol 0.5-2.5 (3) MG/3ML Soln Commonly known as:  DUONEB Take 3 mLs by nebulization 2 (two) times daily.   lactobacillus Pack Take 1 packet (1 g total) by mouth 3 (three) times daily with meals.   menthol-cetylpyridinium 3 MG lozenge Commonly known as:  CEPACOL Take 1 lozenge by mouth every 4 (four) hours as needed for sore throat.   methocarbamol 500 MG tablet Commonly known as:  ROBAXIN Take 1 tablet (500 mg total) by mouth every 6 (six) hours as needed for muscle spasms.   metoprolol tartrate 25 MG tablet Commonly known as:  LOPRESSOR Take 0.5 tablets (12.5 mg total) by mouth 2 (two) times daily.   montelukast 10 MG tablet Commonly known as:  SINGULAIR Take 10 mg by mouth at bedtime.   mupirocin ointment 2 % Commonly known as:  BACTROBAN Place 1 application into the nose 2 (two) times daily. Use through 05/14/2016   MYRBETRIQ 25 MG Tb24 tablet Generic drug:  mirabegron ER Take 50 mg by mouth daily.   ondansetron 4 MG tablet Commonly known as:  ZOFRAN Take 4 mg by mouth every 8 (eight) hours as needed for nausea or vomiting.   polyethylene glycol packet Commonly known as:  MIRALAX / GLYCOLAX Take 17 g by mouth daily as needed for mild constipation.   albuterol (2.5 MG/3ML) 0.083% nebulizer solution Commonly known as:  PROVENTIL Take 2.5 mg by nebulization every 4 (four) hours as needed for wheezing or shortness of breath.   PROAIR HFA 108 (90 Base) MCG/ACT inhaler Generic drug:  albuterol Inhale 2 puffs into the lungs every 4 (four) hours as needed for shortness of breath.   Propylene Glycol-Glycerin 0.6-0.6 % Soln Commonly known as:  SOOTHE Apply 1 drop to eye 2 (two) times daily.   sodium phosphate 7-19 GM/118ML Enem Place 133 mLs (1 enema total) rectally once as needed for severe constipation.    Vitamin D 2000 units tablet Take 2,000 Units by mouth daily.         09/01/2016 1st [facility] office visit/ Jaqwan Wieber   Lives at [facility] since Jan 2017  Chief Complaint  Patient presents with  . Pulmonary Consult    Referred by Dr. Reynold Bowen.  Pt states she has had PNA x 3 since March 2018.  Cough has been prod with clear sputum most recently. She states she also has minimal wheezing.    sleeps almost flat s noct or am flares Intermittent dysphagia but passed last ST  On dulera bid but hfa very poor  With intermittent sub mild wheeze since discharge  No obvious day to day or daytime variability or assoc excess/ purulent sputum or mucus plugs or hemoptysis or cp or chest tightness,  or overt sinus or hb symptoms. No unusual exp hx or h/o childhood pna/ asthma or knowledge of premature birth.  Sleeping ok without nocturnal  or early am exacerbation  of respiratory  c/o's or need for noct saba. Also denies any obvious fluctuation of symptoms with weather or environmental changes or other aggravating or alleviating factors except as outlined above   Current Medications, Allergies, Complete Past Medical History, Past Surgical History, Family History, and Social History were reviewed in [employer] record.  ROS  The following are not active complaints unless bolded sore throat, dysphagia, dental problems, itching, sneezing,  nasal congestion or excess/ purulent secretions, ear ache,   fever, chills, sweats, unintended wt loss, classically pleuritic or exertional cp,  orthopnea pnd or leg swelling, presyncope, palpitations, abdominal pain, anorexia, nausea, vomiting, diarrhea  or change in bowel or bladder habits, change in stools or urine, dysuria,hematuria,  rash, arthralgias, visual complaints, headache, numbness, weakness or ataxia or problems with walking or coordination,  change in mood/affect or memory.                    Review of  Systems     Objective:   Physical Exam  w/c bound elderly wf nad   Wt Readings from Last 3 Encounters:  09/01/16 187 lb 6.4 oz (85 kg)  08/09/16 202 lb 2.6 oz (91.7 kg)  07/07/16 204 lb 12.9 oz (92.9 kg)    Vital signs reviewed  - Note on arrival 02 sats  97% on RA      HEENT: nl dentition, turbinates bilaterally, and oropharynx. Nl external ear canals without cough reflex   NECK :  without JVD/Nodes/TM/ nl carotid upstrokes bilaterally   LUNGS: no acc muscle use,  Nl contour chest which is clear to A and P bilaterally without cough on insp or exp maneuvers   CV:  RRR  no s3 or murmur or increase in P2, and no edema   ABD:  soft and nontender with nl inspiratory excursion in the supine position. No bruits or organomegaly appreciated, bowel sounds nl  MS:  Nl gait/ ext warm without deformities, calf tenderness, cyanosis or clubbing No obvious joint restrictions   SKIN: warm and dry without lesions    NEURO:  alert, approp, nl sensorium with  no motor or cerebellar deficits apparent.     DgEs 08/10/16 1. No gross esophageal abnormality on this single contrast limited evaluation. 2. Nonspecific esophageal motility disorder with tertiary contractions in the mid and distal esophagus. No overt Presbyesophagus.  ST eval 08/10/16 Diet recommendations: Regular;Thin liquid Liquids provided via: Cup;Straw Medication Administration: Whole meds with liquid Supervision: Patient able to self feed     I personally reviewed images and agree with radiology impression as follows:  CXR:   08/12/16  COPD changes with bibasilar atelectasis.        Assessment:

## 2016-09-02 DIAGNOSIS — J4530 Mild persistent asthma, uncomplicated: Secondary | ICD-10-CM | POA: Insufficient documentation

## 2016-09-02 NOTE — Assessment & Plan Note (Signed)
  DgEs 08/10/16 1. No gross esophageal abnormality on this single contrast limited evaluation. 2. Nonspecific esophageal motility disorder with tertiary contractions in the mid and distal esophagus. No overt Presbyesophagus.  ST eval 08/10/16 Diet recommendations: Regular;Thin liquid Liquids provided via: Cup;Straw Medication Administration: Whole meds with liquid Supervision: Patient able to self feed   Referred to GI 09/01/2016 >>>    She is not currently on PPI with intermittent sensation of dysphagia > based on only finding = dysmotility she clearly is at risk of future events so needs GI eval next / will not commit her to  any new meds today.

## 2016-09-02 NOTE — Assessment & Plan Note (Signed)
Body mass index is 30.25 kg/m.  -  trending down, encouraged Lab Results  Component Value Date   TSH 0.791 08/09/2016     Contributing to gerd risk/ doe/reviewed the need and the process to achieve and maintain neg calorie balance > defer f/u primary care including intermittently monitoring thyroid status

## 2016-09-02 NOTE — Assessment & Plan Note (Signed)
09/01/2016  After extensive coaching HFA effectiveness =    75% from a baseline of 25% >  Continue dulera 100 2bid  DDX of  difficult airways management almost all start with A and  include Adherence, Ace Inhibitors, Acid Reflux, Active Sinus Disease, Alpha 1 Antitripsin deficiency, Anxiety masquerading as Airways dz,  ABPA,  Allergy(esp in young), Aspiration (esp in elderly), Adverse effects of meds,  Active smokers, A bunch of PE's (a small clot burden can't cause this syndrome unless there is already severe underlying pulm or vascular dz with poor reserve) plus two Bs  = Bronchiectasis and Beta blocker use..and one C= CHF   In this case Adherence is the biggest issue and starts with  inability to use HFA effectively and also  understand that SABA treats the symptoms but doesn't get to the underlying problem (inflammation).  I used  the analogy of putting steroid cream on a rash to help explain the meaning of topical therapy and the need to get the drug to the target tissue.   - see hfa teaching/ reinforce with each ov and avoid DPI in pt with intermittent dysphagia so if not well controlled on laba/ics hfa low threshold to change to neb delivery   ? Aspiration > see es dysmotility  ? Acid (or non-acid) GERD > always difficult to exclude as up to 75% of pts in some series report no assoc GI/ Heartburn symptoms> rec  Defer rx to gi    ? Allergy > very unlikely at age 32 but rec continue singulair/ low dose ics should cover  ? Active sinus dz > consider sinus ct if not well controlled   ? Chf/ cardiac asthma:  Echo 08/10/16: Left ventricle: The cavity size was normal. Wall thickness was   increased in a pattern of mild LVH. Systolic function was normal.   The estimated ejection fraction was in the range of 55% to 60%.   Wall motion was normal; there were no regional wall motion   abnormalities. Left ventricular diastolic function parameters   were normal. So this is unlikely    Total time  devoted to counseling  > 50 % of initial 60 min office visit:  review case with pt/ discussion of options/alternatives/ personally creating written customized instructions  in presence of pt  then going over those specific  Instructions directly with the pt including how to use all of the meds but in particular covering each new medication in detail and the difference between the maintenance= "automatic" meds and the prns using an action plan format for the latter (If this problem/symptom => do that organization reading Left to right).  Please see AVS from this visit for a full list of these instructions which I personally wrote for this pt and  are unique to this visit.

## 2016-11-04 ENCOUNTER — Ambulatory Visit: Payer: Medicare Other | Admitting: Gastroenterology

## 2016-11-08 ENCOUNTER — Encounter (HOSPITAL_COMMUNITY): Payer: Self-pay

## 2016-11-08 ENCOUNTER — Emergency Department (HOSPITAL_COMMUNITY): Payer: Medicare Other

## 2016-11-08 ENCOUNTER — Inpatient Hospital Stay (HOSPITAL_COMMUNITY)
Admission: EM | Admit: 2016-11-08 | Discharge: 2016-11-10 | DRG: 871 | Disposition: A | Discharge status: Nursing Home (Includes ICF) | Payer: Medicare Other | Attending: Nephrology | Admitting: Nephrology

## 2016-11-08 DIAGNOSIS — Z96653 Presence of artificial knee joint, bilateral: Secondary | ICD-10-CM | POA: Diagnosis present

## 2016-11-08 DIAGNOSIS — B961 Klebsiella pneumoniae [K. pneumoniae] as the cause of diseases classified elsewhere: Secondary | ICD-10-CM | POA: Diagnosis present

## 2016-11-08 DIAGNOSIS — J441 Chronic obstructive pulmonary disease with (acute) exacerbation: Secondary | ICD-10-CM

## 2016-11-08 DIAGNOSIS — Z66 Do not resuscitate: Secondary | ICD-10-CM | POA: Diagnosis present

## 2016-11-08 DIAGNOSIS — G8929 Other chronic pain: Secondary | ICD-10-CM | POA: Diagnosis present

## 2016-11-08 DIAGNOSIS — J9600 Acute respiratory failure, unspecified whether with hypoxia or hypercapnia: Secondary | ICD-10-CM | POA: Diagnosis not present

## 2016-11-08 DIAGNOSIS — Z993 Dependence on wheelchair: Secondary | ICD-10-CM | POA: Diagnosis not present

## 2016-11-08 DIAGNOSIS — M549 Dorsalgia, unspecified: Secondary | ICD-10-CM | POA: Diagnosis present

## 2016-11-08 DIAGNOSIS — N3000 Acute cystitis without hematuria: Secondary | ICD-10-CM | POA: Diagnosis present

## 2016-11-08 DIAGNOSIS — F32A Depression, unspecified: Secondary | ICD-10-CM | POA: Diagnosis present

## 2016-11-08 DIAGNOSIS — Z981 Arthrodesis status: Secondary | ICD-10-CM

## 2016-11-08 DIAGNOSIS — Z860100 Personal history of colon polyps, unspecified: Secondary | ICD-10-CM

## 2016-11-08 DIAGNOSIS — T148XXD Other injury of unspecified body region, subsequent encounter: Secondary | ICD-10-CM | POA: Diagnosis present

## 2016-11-08 DIAGNOSIS — I10 Essential (primary) hypertension: Secondary | ICD-10-CM

## 2016-11-08 DIAGNOSIS — R0603 Acute respiratory distress: Secondary | ICD-10-CM

## 2016-11-08 DIAGNOSIS — J181 Lobar pneumonia, unspecified organism: Secondary | ICD-10-CM

## 2016-11-08 DIAGNOSIS — Z87891 Personal history of nicotine dependence: Secondary | ICD-10-CM | POA: Diagnosis not present

## 2016-11-08 DIAGNOSIS — Z96641 Presence of right artificial hip joint: Secondary | ICD-10-CM | POA: Diagnosis present

## 2016-11-08 DIAGNOSIS — M1990 Unspecified osteoarthritis, unspecified site: Secondary | ICD-10-CM | POA: Diagnosis present

## 2016-11-08 DIAGNOSIS — F418 Other specified anxiety disorders: Secondary | ICD-10-CM | POA: Diagnosis present

## 2016-11-08 DIAGNOSIS — F329 Major depressive disorder, single episode, unspecified: Secondary | ICD-10-CM | POA: Diagnosis not present

## 2016-11-08 DIAGNOSIS — Z23 Encounter for immunization: Secondary | ICD-10-CM

## 2016-11-08 DIAGNOSIS — J440 Chronic obstructive pulmonary disease with acute lower respiratory infection: Secondary | ICD-10-CM | POA: Diagnosis present

## 2016-11-08 DIAGNOSIS — Z7982 Long term (current) use of aspirin: Secondary | ICD-10-CM

## 2016-11-08 DIAGNOSIS — R0602 Shortness of breath: Secondary | ICD-10-CM | POA: Diagnosis present

## 2016-11-08 DIAGNOSIS — M48061 Spinal stenosis, lumbar region without neurogenic claudication: Secondary | ICD-10-CM | POA: Diagnosis present

## 2016-11-08 DIAGNOSIS — A419 Sepsis, unspecified organism: Principal | ICD-10-CM | POA: Diagnosis present

## 2016-11-08 DIAGNOSIS — M4800 Spinal stenosis, site unspecified: Secondary | ICD-10-CM | POA: Diagnosis present

## 2016-11-08 DIAGNOSIS — N390 Urinary tract infection, site not specified: Secondary | ICD-10-CM | POA: Diagnosis not present

## 2016-11-08 DIAGNOSIS — L97919 Non-pressure chronic ulcer of unspecified part of right lower leg with unspecified severity: Secondary | ICD-10-CM | POA: Diagnosis present

## 2016-11-08 DIAGNOSIS — E8881 Metabolic syndrome: Secondary | ICD-10-CM | POA: Diagnosis present

## 2016-11-08 DIAGNOSIS — G4700 Insomnia, unspecified: Secondary | ICD-10-CM | POA: Diagnosis present

## 2016-11-08 DIAGNOSIS — J189 Pneumonia, unspecified organism: Secondary | ICD-10-CM | POA: Diagnosis present

## 2016-11-08 DIAGNOSIS — C50911 Malignant neoplasm of unspecified site of right female breast: Secondary | ICD-10-CM

## 2016-11-08 DIAGNOSIS — G4709 Other insomnia: Secondary | ICD-10-CM | POA: Diagnosis present

## 2016-11-08 DIAGNOSIS — R6510 Systemic inflammatory response syndrome (SIRS) of non-infectious origin without acute organ dysfunction: Secondary | ICD-10-CM | POA: Diagnosis present

## 2016-11-08 DIAGNOSIS — Z79899 Other long term (current) drug therapy: Secondary | ICD-10-CM

## 2016-11-08 DIAGNOSIS — D696 Thrombocytopenia, unspecified: Secondary | ICD-10-CM | POA: Diagnosis present

## 2016-11-08 DIAGNOSIS — Z853 Personal history of malignant neoplasm of breast: Secondary | ICD-10-CM | POA: Diagnosis not present

## 2016-11-08 DIAGNOSIS — J45901 Unspecified asthma with (acute) exacerbation: Secondary | ICD-10-CM

## 2016-11-08 LAB — COMPREHENSIVE METABOLIC PANEL
ALT: 24 U/L (ref 14–54)
AST: 35 U/L (ref 15–41)
Albumin: 4 g/dL (ref 3.5–5.0)
Alkaline Phosphatase: 91 U/L (ref 38–126)
Anion gap: 9 (ref 5–15)
BUN: 13 mg/dL (ref 6–20)
CO2: 27 mmol/L (ref 22–32)
Calcium: 9.2 mg/dL (ref 8.9–10.3)
Chloride: 102 mmol/L (ref 101–111)
Creatinine, Ser: 0.79 mg/dL (ref 0.44–1.00)
GFR calc Af Amer: >60 mL/min (ref >60)
GFR calc non Af Amer: >60 mL/min (ref >60)
Glucose, Bld: 107 mg/dL — ABNORMAL HIGH (ref 65–99)
Potassium: 5.2 mmol/L — ABNORMAL HIGH (ref 3.5–5.1)
Sodium: 138 mmol/L (ref 135–145)
Total Bilirubin: 1.2 mg/dL (ref 0.3–1.2)
Total Protein: 6.8 g/dL (ref 6.5–8.1)

## 2016-11-08 LAB — I-STAT ARTERIAL BLOOD GAS, ED
Acid-Base Excess: 4 mmol/L — ABNORMAL HIGH (ref 0.0–2.0)
Bicarbonate: 30.8 mmol/L — ABNORMAL HIGH (ref 20.0–28.0)
O2 SAT: 100 %
PCO2 ART: 58.3 mmHg — AB (ref 32.0–48.0)
PH ART: 7.341 — AB (ref 7.350–7.450)
Patient temperature: 103
TCO2: 32 mmol/L (ref 22–32)
pO2, Arterial: 252 mmHg — ABNORMAL HIGH (ref 83.0–108.0)

## 2016-11-08 LAB — LACTIC ACID, PLASMA
LACTIC ACID, VENOUS: 6 mmol/L — AB (ref 0.5–1.9)
Lactic Acid, Venous: 4.5 mmol/L (ref 0.5–1.9)
Lactic Acid, Venous: 2.8 mmol/L (ref 0.5–1.9)

## 2016-11-08 LAB — BRAIN NATRIURETIC PEPTIDE: B NATRIURETIC PEPTIDE 5: 50.8 pg/mL (ref 0.0–100.0)

## 2016-11-08 LAB — CBC WITH DIFFERENTIAL/PLATELET
BASOS ABS: 0 10*3/uL (ref 0.0–0.1)
BASOS PCT: 0 %
EOS ABS: 0.1 10*3/uL (ref 0.0–0.7)
EOS PCT: 1 %
HEMATOCRIT: 45.7 % (ref 36.0–46.0)
Hemoglobin: 14.6 g/dL (ref 12.0–15.0)
Lymphocytes Relative: 7 %
Lymphs Abs: 0.8 10*3/uL (ref 0.7–4.0)
MCH: 30.9 pg (ref 26.0–34.0)
MCHC: 31.9 g/dL (ref 30.0–36.0)
MCV: 96.8 fL (ref 78.0–100.0)
MONO ABS: 0.6 10*3/uL (ref 0.1–1.0)
MONOS PCT: 5 %
Neutro Abs: 9.1 10*3/uL — ABNORMAL HIGH (ref 1.7–7.7)
Neutrophils Relative %: 87 %
PLATELETS: 169 10*3/uL (ref 150–400)
RBC: 4.72 MIL/uL (ref 3.87–5.11)
RDW: 13.6 % (ref 11.5–15.5)
WBC: 10.5 10*3/uL (ref 4.0–10.5)

## 2016-11-08 LAB — I-STAT CG4 LACTIC ACID, ED
LACTIC ACID, VENOUS: 3.42 mmol/L — AB (ref 0.5–1.9)
Lactic Acid, Venous: 1.87 mmol/L (ref 0.5–1.9)

## 2016-11-08 LAB — URINALYSIS, ROUTINE W REFLEX MICROSCOPIC
Bilirubin Urine: NEGATIVE
GLUCOSE, UA: NEGATIVE mg/dL
KETONES UR: NEGATIVE mg/dL
LEUKOCYTES UA: NEGATIVE
Nitrite: POSITIVE — AB
PROTEIN: 30 mg/dL — AB
Specific Gravity, Urine: 1.01 (ref 1.005–1.030)
pH: 6 (ref 5.0–8.0)

## 2016-11-08 LAB — TROPONIN I: Troponin I: <0.03 ng/mL (ref <0.03)

## 2016-11-08 LAB — MRSA PCR SCREENING: MRSA by PCR: NEGATIVE

## 2016-11-08 LAB — PROCALCITONIN: PROCALCITONIN: 5.19 ng/mL

## 2016-11-08 MED ORDER — DEXTROSE 5 % IV SOLN
1.0000 g | Freq: Three times a day (TID) | INTRAVENOUS | Status: DC
  Administered 2016-11-08 – 2016-11-10 (×5): 1 g via INTRAVENOUS
  Filled 2016-11-08 (×8): qty 1

## 2016-11-08 MED ORDER — ESCITALOPRAM OXALATE 10 MG PO TABS
10.0000 mg | ORAL_TABLET | Freq: Every day | ORAL | Status: DC
  Administered 2016-11-09 – 2016-11-10 (×2): 10 mg via ORAL
  Filled 2016-11-08 (×2): qty 1

## 2016-11-08 MED ORDER — SODIUM CHLORIDE 0.9 % IV BOLUS (SEPSIS)
1000.0000 mL | Freq: Once | INTRAVENOUS | Status: AC
  Administered 2016-11-08: 1000 mL via INTRAVENOUS

## 2016-11-08 MED ORDER — MIRABEGRON ER 50 MG PO TB24
50.0000 mg | ORAL_TABLET | Freq: Every day | ORAL | Status: DC
  Administered 2016-11-09 – 2016-11-10 (×2): 50 mg via ORAL
  Filled 2016-11-08 (×3): qty 1

## 2016-11-08 MED ORDER — VANCOMYCIN HCL IN DEXTROSE 750-5 MG/150ML-% IV SOLN: 750.0000 mg | Freq: Two times a day (BID) | INTRAVENOUS | Status: DC

## 2016-11-08 MED ORDER — IPRATROPIUM-ALBUTEROL 0.5-2.5 (3) MG/3ML IN SOLN: 3.0000 mL | RESPIRATORY_TRACT | Status: DC | PRN

## 2016-11-08 MED ORDER — IPRATROPIUM-ALBUTEROL 0.5-2.5 (3) MG/3ML IN SOLN
3.0000 mL | RESPIRATORY_TRACT | Status: DC
  Administered 2016-11-08 – 2016-11-09 (×6): 3 mL via RESPIRATORY_TRACT
  Filled 2016-11-08 (×6): qty 3

## 2016-11-08 MED ORDER — VANCOMYCIN HCL 10 G IV SOLR
1500.0000 mg | Freq: Once | INTRAVENOUS | Status: AC
  Administered 2016-11-08: 1500 mg via INTRAVENOUS
  Filled 2016-11-08: qty 1500

## 2016-11-08 MED ORDER — PIPERACILLIN-TAZOBACTAM 3.375 G IVPB: 3.3750 g | Freq: Three times a day (TID) | INTRAVENOUS | Status: DC

## 2016-11-08 MED ORDER — ONDANSETRON HCL 4 MG PO TABS: 4.0000 mg | ORAL_TABLET | Freq: Four times a day (QID) | ORAL | Status: DC | PRN

## 2016-11-08 MED ORDER — ALBUTEROL SULFATE HFA 108 (90 BASE) MCG/ACT IN AERS: 2.0000 | INHALATION_SPRAY | RESPIRATORY_TRACT | Status: DC | PRN

## 2016-11-08 MED ORDER — ONDANSETRON HCL 4 MG/2ML IJ SOLN
4.0000 mg | Freq: Four times a day (QID) | INTRAMUSCULAR | Status: DC | PRN
Start: 2016-11-08 — End: 2016-11-10

## 2016-11-08 MED ORDER — DOCUSATE SODIUM 100 MG PO CAPS
100.0000 mg | ORAL_CAPSULE | Freq: Every day | ORAL | Status: DC
  Administered 2016-11-09 – 2016-11-10 (×2): 100 mg via ORAL
  Filled 2016-11-08 (×2): qty 1

## 2016-11-08 MED ORDER — SODIUM CHLORIDE 0.9 % IV SOLN
INTRAVENOUS | Status: DC
  Administered 2016-11-08 – 2016-11-09 (×2): via INTRAVENOUS

## 2016-11-08 MED ORDER — BENZONATATE 100 MG PO CAPS: 200.0000 mg | ORAL_CAPSULE | Freq: Three times a day (TID) | ORAL | Status: DC | PRN

## 2016-11-08 MED ORDER — METHYLPREDNISOLONE SODIUM SUCC 125 MG IJ SOLR
60.0000 mg | Freq: Two times a day (BID) | INTRAMUSCULAR | Status: DC
Start: 2016-11-08 — End: 2016-11-10
  Administered 2016-11-08 – 2016-11-10 (×4): 60 mg via INTRAVENOUS
  Filled 2016-11-08 (×4): qty 2

## 2016-11-08 MED ORDER — VANCOMYCIN HCL IN DEXTROSE 750-5 MG/150ML-% IV SOLN
750.0000 mg | Freq: Two times a day (BID) | INTRAVENOUS | Status: DC
  Administered 2016-11-08 – 2016-11-09 (×2): 750 mg via INTRAVENOUS
  Filled 2016-11-08 (×2): qty 150

## 2016-11-08 MED ORDER — METHOCARBAMOL 500 MG PO TABS
500.0000 mg | ORAL_TABLET | Freq: Four times a day (QID) | ORAL | Status: DC | PRN
  Filled 2016-11-08: qty 1

## 2016-11-08 MED ORDER — ACETAMINOPHEN 325 MG PO TABS: 650.0000 mg | ORAL_TABLET | Freq: Once | ORAL | Status: DC

## 2016-11-08 MED ORDER — SODIUM CHLORIDE 0.9 % IV BOLUS (SEPSIS)
500.0000 mL | Freq: Once | INTRAVENOUS | Status: AC
  Administered 2016-11-08: 500 mL via INTRAVENOUS

## 2016-11-08 MED ORDER — INFLUENZA VAC SPLIT HIGH-DOSE 0.5 ML IM SUSY
0.5000 mL | PREFILLED_SYRINGE | INTRAMUSCULAR | Status: AC
Start: 2016-11-09 — End: 2016-11-10
  Administered 2016-11-10: 0.5 mL via INTRAMUSCULAR
  Filled 2016-11-08 (×2): qty 0.5

## 2016-11-08 MED ORDER — CALCIUM CARBONATE-VITAMIN D 500-200 MG-UNIT PO TABS
3.0000 | ORAL_TABLET | Freq: Every day | ORAL | Status: DC
  Administered 2016-11-09 – 2016-11-10 (×2): 3 via ORAL
  Filled 2016-11-08 (×4): qty 3

## 2016-11-08 MED ORDER — ACETAMINOPHEN 650 MG RE SUPP
650.0000 mg | Freq: Once | RECTAL | Status: AC
Start: 2016-11-08 — End: 2016-11-08
  Administered 2016-11-08: 650 mg via RECTAL
  Filled 2016-11-08: qty 1

## 2016-11-08 MED ORDER — GUAIFENESIN ER 600 MG PO TB12: 600.0000 mg | ORAL_TABLET | Freq: Two times a day (BID) | ORAL | Status: DC | PRN

## 2016-11-08 MED ORDER — VITAMIN D3 25 MCG (1000 UNIT) PO TABS
2000.0000 [IU] | ORAL_TABLET | Freq: Every day | ORAL | Status: DC
  Administered 2016-11-09 – 2016-11-10 (×2): 2000 [IU] via ORAL
  Filled 2016-11-08 (×4): qty 2

## 2016-11-08 MED ORDER — ALBUTEROL (5 MG/ML) CONTINUOUS INHALATION SOLN
10.0000 mg/h | INHALATION_SOLUTION | Freq: Once | RESPIRATORY_TRACT | Status: AC
  Administered 2016-11-08: 10 mg/h via RESPIRATORY_TRACT
  Filled 2016-11-08: qty 20

## 2016-11-08 MED ORDER — VANCOMYCIN HCL IN DEXTROSE 1-5 GM/200ML-% IV SOLN: 1000.0000 mg | Freq: Once | INTRAVENOUS | Status: DC

## 2016-11-08 MED ORDER — CLONAZEPAM 0.5 MG PO TABS
1.0000 mg | ORAL_TABLET | Freq: Every evening | ORAL | Status: DC | PRN
  Administered 2016-11-09: 1 mg via ORAL
  Filled 2016-11-08: qty 2

## 2016-11-08 MED ORDER — DEXTROSE 5 % IV SOLN
1.0000 g | INTRAVENOUS | Status: DC
  Administered 2016-11-08: 1 g via INTRAVENOUS
  Filled 2016-11-08: qty 10

## 2016-11-08 MED ORDER — HYDROCHLOROTHIAZIDE 12.5 MG PO CAPS: 12.5000 mg | ORAL_CAPSULE | Freq: Every day | ORAL | Status: DC

## 2016-11-08 MED ORDER — IBUPROFEN 200 MG PO TABS: 200.0000 mg | ORAL_TABLET | Freq: Two times a day (BID) | ORAL | Status: DC | PRN

## 2016-11-08 MED ORDER — GABAPENTIN 300 MG PO CAPS
300.0000 mg | ORAL_CAPSULE | Freq: Three times a day (TID) | ORAL | Status: DC
  Administered 2016-11-08 – 2016-11-10 (×6): 300 mg via ORAL
  Filled 2016-11-08 (×6): qty 1

## 2016-11-08 MED ORDER — MOMETASONE FURO-FORMOTEROL FUM 200-5 MCG/ACT IN AERO
1.0000 | INHALATION_SPRAY | Freq: Two times a day (BID) | RESPIRATORY_TRACT | Status: DC
  Administered 2016-11-09 – 2016-11-10 (×3): 1 via RESPIRATORY_TRACT
  Filled 2016-11-08: qty 8.8

## 2016-11-08 MED ORDER — POLYETHYLENE GLYCOL 3350 17 G PO PACK: 17.0000 g | PACK | Freq: Every day | ORAL | Status: DC | PRN

## 2016-11-08 MED ORDER — METOPROLOL TARTRATE 25 MG PO TABS
12.5000 mg | ORAL_TABLET | Freq: Two times a day (BID) | ORAL | Status: DC
  Administered 2016-11-09 – 2016-11-10 (×2): 12.5 mg via ORAL
  Filled 2016-11-08 (×2): qty 1

## 2016-11-08 MED ORDER — ACETAMINOPHEN 325 MG PO TABS
650.0000 mg | ORAL_TABLET | Freq: Four times a day (QID) | ORAL | Status: DC | PRN
  Administered 2016-11-09: 650 mg via ORAL
  Filled 2016-11-08: qty 2

## 2016-11-08 MED ORDER — METOPROLOL TARTRATE 25 MG PO TABS: 12.5000 mg | ORAL_TABLET | Freq: Two times a day (BID) | ORAL | Status: DC

## 2016-11-08 MED ORDER — BUPROPION HCL ER (XL) 150 MG PO TB24: 300.0000 mg | ORAL_TABLET | ORAL | Status: DC

## 2016-11-08 MED ORDER — MONTELUKAST SODIUM 10 MG PO TABS
10.0000 mg | ORAL_TABLET | Freq: Every day | ORAL | Status: DC
  Administered 2016-11-08 – 2016-11-09 (×2): 10 mg via ORAL
  Filled 2016-11-08 (×2): qty 1

## 2016-11-08 MED ORDER — PIPERACILLIN-TAZOBACTAM 3.375 G IVPB 30 MIN
3.3750 g | Freq: Once | INTRAVENOUS | Status: AC
  Administered 2016-11-08: 3.375 g via INTRAVENOUS
  Filled 2016-11-08: qty 50

## 2016-11-08 MED ORDER — FLUTICASONE PROPIONATE 50 MCG/ACT NA SUSP
1.0000 | Freq: Two times a day (BID) | NASAL | Status: DC
  Administered 2016-11-09 (×2): 1 via NASAL
  Filled 2016-11-08 (×2): qty 16

## 2016-11-08 MED ORDER — POLYVINYL ALCOHOL 1.4 % OP SOLN
1.0000 [drp] | Freq: Two times a day (BID) | OPHTHALMIC | Status: DC
  Administered 2016-11-09 (×2): 1 [drp] via OPHTHALMIC
  Filled 2016-11-08 (×2): qty 15

## 2016-11-08 MED ORDER — DEXTROSE 5 % IV SOLN
500.0000 mg | INTRAVENOUS | Status: DC
  Administered 2016-11-08 – 2016-11-10 (×3): 500 mg via INTRAVENOUS
  Filled 2016-11-08 (×4): qty 500

## 2016-11-08 MED ORDER — ASPIRIN EC 325 MG PO TBEC
325.0000 mg | DELAYED_RELEASE_TABLET | Freq: Every day | ORAL | Status: DC
  Administered 2016-11-09 – 2016-11-10 (×2): 325 mg via ORAL
  Filled 2016-11-08 (×2): qty 1

## 2016-11-08 MED ORDER — HEPARIN SODIUM (PORCINE) 5000 UNIT/ML IJ SOLN
5000.0000 [IU] | Freq: Three times a day (TID) | INTRAMUSCULAR | Status: DC
  Administered 2016-11-08 – 2016-11-09 (×3): 5000 [IU] via SUBCUTANEOUS
  Filled 2016-11-08 (×3): qty 1

## 2016-11-08 MED ORDER — ACETAMINOPHEN 650 MG RE SUPP: 650.0000 mg | Freq: Four times a day (QID) | RECTAL | Status: DC | PRN

## 2016-11-08 MED ORDER — ALBUTEROL SULFATE (2.5 MG/3ML) 0.083% IN NEBU: 2.5000 mg | INHALATION_SOLUTION | RESPIRATORY_TRACT | Status: DC | PRN

## 2016-11-08 NOTE — ED Triage Notes (Signed)
Pt presents via [facility] from [facility] for respiratory distress. EMS reports pt was cyanotic to lips and extremities on arrival with shallow respirations, retractions, hx of copd. Pt given 10 mg albuterol, 0.5 mg atrovent, 2 g magnesium in route. Pt able to follow commands.

## 2016-11-08 NOTE — H&P (Signed)
History and Physical    MADDIX KLIEWER QQV:[id number] DOB: 29-Apr-1930 DOA: 11/08/2016   PCP: Reynold Bowen, MD   Patient coming from:  Home    Chief Complaint: Shortness of breath   HPI: [name] is a 83 y.o. female with a history of COPD not on oxygen at home, diabetes, hypertension, history of breast cancer, prior history of pneumonia, last event in June of this year, brought by EMS from assisted living facility, after being found very short of breath, coughing, respiratory distress. Per chart note, she was wheezing in all fields. On transport, she received albuterol 10 mg, Atrovent nebulizer, Solu-Medrol 125 mg and magnesium 2 g, with significant response. On arrival, she was treated  with BiPAP, improving aeration. Eventually, the BiPAP was removed, and she is on 3 L of nasal cannula, with improved symptoms. She denies any fever, chills, night sweats, chest pain or palpitations. She denies any lower extremity swelling or calf pain. No confusion was noted. Of note, the patient also reports dysuria, without hematuria. Urine stream however is normal. This was accompanied  yesterday with some nausea and vomiting and 1 episode of lose stools without recurrence. Initially, she thought she had food poisoning, and after vomiting it subsided. Of note, she had taken LAxatives prior to this loose stool episode   ED Course:  BP (!) 113/53   Pulse (!) 125   Temp (!) 103.4 F (39.7 C) (Rectal)   Resp 20   Wt 85 kg (187 lb 6.3 oz)   SpO2 100%   BMI 30.25 kg/m    Potassium 5.2 Glucose 107 Creatinine 0.79 B and P 50 troponin negative less than 0.03 lactic acid 1.87 White count 10.5 Hemoglobin 14.6 platelets 169   urine positive for nitrites Urine and blood culture pending   EKG sinus tachycardia without changes from prior Last echo June 2018 with normal systolic function, EF 33-29% She received 1500 mL of fluid at the ED, and she was also placed on vancomycin and Zosyn for sepsis protocol.  Cultures have been taken prior to administration of antibiotics. CXR developing RLL Pneumonia   Review of Systems:  As per HPI otherwise all other systems reviewed and are negative  Past Medical History:  Diagnosis Date  . Arthritis   . Asthma   . Breast cancer ([place]) 2006   right w/lumpectomy  . COPD (chronic obstructive pulmonary disease) ([place])   . Depression   . Diabetes ([place]) 2007  . Diverticulosis of colon   . Endometrial hyperplasia 01/1996   fibroids, adenomyosis  . Frequent UTI   . History of colon polyps   . Hypertension   . Insomnia   . Osteopenia 12/08   hip  . Spinal stenosis 2001  . Vitamin D deficiency     Past Surgical History:  Procedure Laterality Date  . BILATERAL SALPINGOOPHORECTOMY  12/97  . BREAST LUMPECTOMY Right 03/2003  . CARPAL TUNNEL RELEASE    . CERVICAL FUSION    . CERVICAL LAMINECTOMY  1975   x4  . CHOLECYSTECTOMY  11/00   lap chole  . HYSTEROSCOPY  10/97   D&C, postmenopausal bleeding  . LUMBAR LAMINECTOMY  10/06   with spinal stenosis  . manipulation of hip for dislocation    . ROTATOR CUFF REPAIR Right 11/96  . TONSILLECTOMY AND ADENOIDECTOMY    . TOTAL ABDOMINAL HYSTERECTOMY  12/97   endo hyperplasia, fibroids, adenomyosis  . TOTAL HIP ARTHROPLASTY Right 8/00  . TOTAL HIP REVISION Right 07/10/2015  Procedure: RIGHT ACETABULAR VS TOTAL HIP REVISION;  Surgeon: Gaynelle Arabian, MD;  Location: [facility];  Service: Orthopedics;  Laterality: Right;  . TOTAL KNEE ARTHROPLASTY Left 06/2003  . TOTAL KNEE ARTHROPLASTY Right 10/06  . TUBAL LIGATION Bilateral 1968    Social History Social History   Social History  . Marital status: Married    Spouse name: N/A  . Number of children: N/A  . Years of education: N/A   Occupational History  . Not on file.   Social History Main Topics  . Smoking status: Former Smoker    Packs/day: 0.25    Years: 10.00    Types: Cigarettes    Quit date: 04/24/1990  . Smokeless tobacco: Never Used  .  Alcohol use 2.4 oz/week    4 Glasses of wine per week     Comment: occ  . Drug use: No  . Sexual activity: Not Currently   Other Topics Concern  . Not on file   Social History Narrative  . No narrative on file     Allergies  Allergen Reactions  . Oxycodone Hcl [Oxycodone Hcl] Rash  . Morphine Sulfate Other (See Comments)    REACTION: very anxious  . Sulfamethoxazole Other (See Comments)    REACTION: unspecified    Family History  Problem Relation Age of Onset  . Diabetes Father   . Colon cancer Father 93  . Heart failure Father   . Arthritis Mother   . Thyroid disease Mother   . Osteoporosis Mother   . Diabetes Brother   . Diabetes Brother   . Osteoporosis Maternal Aunt   . Osteoporosis Maternal Grandmother       Prior to Admission medications   Medication Sig Start Date End Date Taking? Authorizing Provider  acetaminophen (TYLENOL) 325 MG tablet Take 2 tablets (650 mg total) by mouth every 6 (six) hours as needed for mild pain (or Fever >/= 101). 07/12/15  Yes Perkins, Alexzandrew L, PA-C  acetaminophen (TYLENOL) 500 MG tablet Take 500 mg by mouth every 8 (eight) hours as needed for mild pain.   Yes [provider]  acetaminophen (TYLENOL) 650 MG CR tablet Take 650-1,300 mg by mouth every 12 (twelve) hours as needed for pain.   Yes [provider]  albuterol (PROAIR HFA) 108 (90 BASE) MCG/ACT inhaler Inhale 2 puffs into the lungs every 4 (four) hours as needed for shortness of breath.    Yes [provider]  aspirin EC 325 MG tablet Take 325 mg by mouth daily.    Yes [provider]  Benzocaine-Menthol (CEPACOL SORE THROAT) 15-2.6 MG LOZG Use as directed 1 lozenge in the mouth or throat every 4 (four) hours as needed (for sore throat).   Yes [provider]  benzonatate (TESSALON) 200 MG capsule Take 200 mg by mouth 3 (three) times daily as needed for cough.   Yes [provider]  buPROPion (WELLBUTRIN XL) 300 MG 24  hr tablet Take 300 mg by mouth every morning.    Yes [provider]  Calcium Carb-Cholecalciferol (CALCIUM+D3) 600-800 MG-UNIT TABS Take 1 tablet by mouth daily.   Yes [provider]  Cholecalciferol (VITAMIN D) 2000 units tablet Take 2,000 Units by mouth daily.   Yes [provider]  ciclopirox (PENLAC) 8 % solution Apply 1 application topically every Tuesday. Apply over nail and surrounding skin. Apply daily over previous coat. After seven (7) days, may remove with alcohol and continue cycle. 10/20/16 11/10/16 Yes [provider]  clonazePAM (KLONOPIN) 1 MG tablet Take 1 tablet (1 mg total) by mouth at bedtime as needed (insomnia). Patient taking differently: Take 1 mg by mouth at bedtime.  08/12/16  Yes Florencia Reasons, MD  docusate sodium (COLACE) 100 MG capsule Take 100 mg by mouth daily.   Yes [provider]  escitalopram (LEXAPRO) 10 MG tablet Take 10 mg by mouth daily.  04/20/11  Yes [provider]  fluticasone (FLONASE) 50 MCG/ACT nasal spray Place 1 spray into both nostrils 2 (two) times daily.   Yes [provider]  gabapentin (NEURONTIN) 300 MG capsule Take 1 capsule (300 mg total) by mouth 3 (three) times daily. 11/14/15  Yes Magrinat, Virgie Dad, MD  guaiFENesin (MUCINEX) 600 MG 12 hr tablet Take 600 mg by mouth 2 (two) times daily as needed for cough.   Yes [provider]  hydrochlorothiazide (MICROZIDE) 12.5 MG capsule Take 12.5 mg by mouth daily.   Yes [provider]  HYDROcodone-acetaminophen (NORCO/VICODIN) 5-325 MG tablet Take 1 tablet by mouth 2 (two) times daily as needed for moderate pain.   Yes [provider]  ibuprofen (ADVIL) 200 MG tablet Take 200 mg by mouth every 12 (twelve) hours as needed (pain).    Yes [provider]  methocarbamol (ROBAXIN) 500 MG tablet Take 1 tablet (500 mg total) by mouth every 6 (six) hours as needed for muscle spasms. 07/12/15  Yes Perkins, Alexzandrew L, PA-C   metoprolol tartrate (LOPRESSOR) 25 MG tablet Take 0.5 tablets (12.5 mg total) by mouth 2 (two) times daily. 08/12/16  Yes Florencia Reasons, MD  mirabegron ER (MYRBETRIQ) 50 MG TB24 tablet Take 50 mg by mouth daily.   Yes [provider]  mometasone-formoterol (DULERA) 200-5 MCG/ACT AERO Inhale 1 puff into the lungs 2 (two) times daily.   Yes [provider]  montelukast (SINGULAIR) 10 MG tablet Take 10 mg by mouth at bedtime.     Yes [provider]  ondansetron (ZOFRAN) 4 MG tablet Take 4 mg by mouth every 8 (eight) hours as needed for nausea or vomiting.   Yes [provider]  polyethylene glycol (MIRALAX / GLYCOLAX) packet Take 17 g by mouth daily as needed for mild constipation. 07/12/15  Yes Perkins, Alexzandrew L, PA-C  Propylene Glycol-Glycerin (SOOTHE) 0.6-0.6 % SOLN Apply 1 drop to eye 2 (two) times daily. 11/28/15  Yes Domenic Moras, PA-C  sodium phosphate (FLEET) 7-19 GM/118ML ENEM Place 1 enema rectally daily as needed for severe constipation.   Yes [provider]  lactobacillus (FLORANEX/LACTINEX) PACK Take 1 packet (1 g total) by mouth 3 (three) times daily with meals. Patient not taking: Reported on 11/08/2016 08/12/16   Florencia Reasons, MD  mometasone-formoterol [facility]) 100-5 MCG/ACT AERO Inhale 2 puffs into the lungs 2 (two) times daily. Patient not taking: Reported on 11/08/2016 09/01/16   Tanda Rockers, MD    Physical Exam:  Vitals:   11/08/16 0930 11/08/16 0932 11/08/16 0945 11/08/16 1015  BP: 130/87  130/76 (!) 113/53  Pulse: (!) 104  (!) 117 (!) 125  Resp: 15  (!) 24 20  Temp:      TempSrc:      SpO2: 99%  100% 100%  Weight:  85 kg (187 lb 6.3 oz)     Constitutional: NAD, calm,ill appearing Eyes: PERRL, lids and conjunctivae normal ENMT: Mucous membranes are moist, without exudate or lesions  Neck: normal, supple, no masses, no thyromegaly Respiratory: decreased breath sounds at the bases with diffuse wheezing.  No rhonchi or crackles.  Normal respiratory effort  Cardiovascular:tachy  rate and rhythm, no murmur, rubs or gallops. No extremity edema. 2+ pedal pulses. No carotid bruits.  Abdomen: Soft, obese  non tender, No hepatosplenomegaly. Bowel sounds positive.  Musculoskeletal: no clubbing / cyanosis. Moves all extremities Skin: no jaundice, No lesions. Skin is dry  Neurologic: Sensation intact  Strength equal in all extremities     Labs on Admission: I have personally reviewed following labs and imaging studies  CBC:  Recent Labs Lab 11/08/16 0855  WBC 10.5  NEUTROABS 9.1*  HGB 14.6  HCT 45.7  MCV 96.8  PLT 086    Basic Metabolic Panel:  Recent Labs Lab 11/08/16 0855  NA 138  K 5.2*  CL 102  CO2 27  GLUCOSE 107*  BUN 13  CREATININE 0.79  CALCIUM 9.2    GFR: Estimated Creatinine Clearance: 57.5 mL/min (by C-G formula based on SCr of 0.79 mg/dL).  Liver Function Tests:  Recent Labs Lab 11/08/16 0855  AST 35  ALT 24  ALKPHOS 91  BILITOT 1.2  PROT 6.8  ALBUMIN 4.0   No results for input(s): LIPASE, AMYLASE in the last 168 hours. No results for input(s): AMMONIA in the last 168 hours.  Coagulation Profile: No results for input(s): INR, PROTIME in the last 168 hours.  Cardiac Enzymes:  Recent Labs Lab 11/08/16 0855  TROPONINI <0.03    BNP (last 3 results) No results for input(s): PROBNP in the last 8760 hours.  HbA1C: No results for input(s): HGBA1C in the last 72 hours.  CBG: No results for input(s): GLUCAP in the last 168 hours.  Lipid Profile: No results for input(s): CHOL, HDL, LDLCALC, TRIG, CHOLHDL, LDLDIRECT in the last 72 hours.  Thyroid Function Tests: No results for input(s): TSH, T4TOTAL, FREET4, T3FREE, THYROIDAB in the last 72 hours.  Anemia Panel: No results for input(s): VITAMINB12, FOLATE, FERRITIN, TIBC, IRON, RETICCTPCT in the last 72 hours.  Urine analysis:    Component Value Date/Time   COLORURINE YELLOW 11/08/2016 0922   APPEARANCEUR CLEAR  11/08/2016 0922   LABSPEC 1.010 11/08/2016 0922   PHURINE 6.0 11/08/2016 0922   GLUCOSEU NEGATIVE 11/08/2016 0922   HGBUR SMALL (A) 11/08/2016 0922   BILIRUBINUR NEGATIVE 11/08/2016 0922   BILIRUBINUR n 09/25/2010 1525   KETONESUR NEGATIVE 11/08/2016 0922   PROTEINUR 30 (A) 11/08/2016 0922   UROBILINOGEN 0.2 09/25/2010 1525   UROBILINOGEN 0.2 01/04/2007 0900   NITRITE POSITIVE (A) 11/08/2016 0922   LEUKOCYTESUR NEGATIVE 11/08/2016 0922    Sepsis Labs: @LABRCNTIP (procalcitonin:4,lacticidven:4) )No results found for this or any previous visit (from the past 240 hour(s)).   Radiological Exams on Admission: Dg Chest Port 1 View  Result Date: 11/08/2016 CLINICAL DATA:  Respiratory distress EXAM: PORTABLE CHEST 1 VIEW COMPARISON:  August 12, 2016 FINDINGS: No pneumothorax. The heart, hila, and mediastinum are normal. Increasing opacity in the medial right base favored represent infiltrate rather than a vascular crowding. No other interval changes or acute abnormalities. IMPRESSION: Developing infiltrate in the medial right lung base. Recommend follow-up to resolution. Electronically Signed   By: Dorise Bullion III M.D   On: 11/08/2016 08:54    EKG: Independently reviewed.  Assessment/Plan Active Problems:   Dysmetabolic syndrome X   INSOMNIA, PERSISTENT   Depression   Essential hypertension   Asthma, chronic, unspecified asthma severity, with acute exacerbation   Osteoarthritis   STENOSIS, LUMBAR SPINE   COLONIC POLYPS, HX OF   Breast cancer, right breast ([place])  Chronic ulcer of right leg ([facility])   Wheelchair bound   Acute respiratory distress in the setting of  Sepsis vs. Sirs likely due to Pneumonia and Urine source  Patient meets criteria given tachycardia, tachypnea, fever,  Antibiotics delivered in the ED with Vanc and Zosyn. UA positive for Nitrites. Lactic acid is normal . Urine is positive for nitrites. T max  100.3  CBC normal . CXR developing RLL PNA. EKG Sinus Tach patient  initially was given nebulizers, Solu-Medrol, and magnesium, as well as being placed on BiPAP, with improvement of her symptoms. She continues to wheeze, but her cough and shortness of breath have improved.. Cultures were drawn. She was placed on vancomycin and Zosyn.   Admit to SDU Sepsis order set  IV antibiotics by pharmacy with Rocephin and Zithromax per pharmacy dose. DuoNebs every 4 hours when necessary, and albuterol every 2 hours when necessary if not tachycardic. Follow lactic acid   Follow blood, sputum and urine cultures Continue Mucinex and Tessalon Perles If symptoms improve, the patient may resume her inhalers tomorrow. IV fluids at 100 cc/h.  Continue antipyretics and antiemetics prn CBC in am   UTI suggested by UA + nitrites   WBC is 10.3 . Received IV fluids, cultures were drawn. Due to presumed sepsis, as mentioned above, the patient will be on Rocephin    F/u urine culture Ceftriaxone IV  Follow CBC in am  IVF  Continue Myrbetriq   Hypertension BP 101/64  Pulse 114 , slightly lower than prior measurements. Received 1500 cc IV NS, another bolus added now, Continue to monitor closely, hold BP, diuretics for now Continue IVF 100 cc/h  Continue home anti-hypertensive medications in am she took her am dose   Anxiety and Depression Continue Klonopin  for anxiety and  Lexapro, Wellbutrinfor depression      Type II Diabetes, diet controlled  Current blood sugar level is 107  Lab Results  Component Value Date   HGBA1C 5.6 08/09/2016  Hgb A1 Contiue to monitor CBGs  Continue Neurontin    DVT prophylaxis:  Heparin  Code Status:   DNR   Family Communication:  Discussed with patient Disposition Plan: Expect patient to be discharged to home after condition improves Consults called:   None  Admission status: SDU    Laron Angelini E, PA-C [facility]   11/08/2016, 11:04 AM

## 2016-11-08 NOTE — Progress Notes (Addendum)
Pharmacy Antibiotic Note  [name] is a 83 y.o. female admitted on 11/08/2016 with sepsis.  Pharmacy has been consulted for Zosyn and vancomycin dosing and then switched to Rocephin and Azithromycin.  Tmax of 103.4, WBC wnl. SCr ok, CrCl ~30ml/min. Does have admission about 3 months ago with IV abx for PNA. Discussed with provider and is ok with CAP and UTI coverage for now.   Plan: Stopping vancomycin and Zosyn Start ceftriaxone 1g IV Q24h Start azithromycin 500mg  IV Q24h Monitor clinical picture, renal function, VT  F/U C&S, abx deescalation / LOT   Weight: 187 lb 6.3 oz (85 kg)  Temp (24hrs), Avg:101.2 F (38.4 C), Min:98.9 F (37.2 C), Max:103.4 F (39.7 C)   Recent Labs Lab 11/08/16 0855 11/08/16 0937  WBC 10.5  --   LATICACIDVEN  --  1.87    CrCl cannot be calculated (Patient's most recent lab result is older than the maximum 21 days allowed.).    Allergies  Allergen Reactions  . Oxycodone Hcl [Oxycodone Hcl] Rash  . Morphine Sulfate Other (See Comments)    REACTION: very anxious  . Sulfamethoxazole Other (See Comments)    REACTION: unspecified    Thank you for allowing pharmacy to be a part of this patient's care.  Reginia Naas 11/08/2016 9:39 AM   Addendum. Antibiotics broadened to include cefepime, azithromycin and vancomycin.    Plan: 1. Cefepime 1 gram IV every 8 hours  2. Azithromycin 500 mg IV every 24 hours  3. Vancomycin 750 mg IV every 12 hours   Vincenza Hews, PharmD, BCPS 11/08/2016, 3:13 PM

## 2016-11-08 NOTE — ED Provider Notes (Addendum)
[facility] DEPT Provider Note   CSN: [id number] Arrival date & time: 11/08/16  0830     History   Chief Complaint Chief Complaint  Patient presents with  . Respiratory Distress    HPI [name] is a 83 y.o. female. Level V caveat HPI  83 year old female history of COPD, diabetes, hypertension, breast cancer presents today with respiratory failure. She lives in assisted living care facility and EMS was called out for her respiratory distress. They found her with wheezing in all fields. These albuterol 10 mg, Atrovent 0.5, 25 of Solu-Medrol, and 2 g of magnesium prehospital. She was treated with BiPAP. She had improved aeration in route. She is able to shake her head and answer some questions. She states that she was sick yesterday and vomited one time. She denies any cough, fever, or other pain. 11:01 AM He interviewed patient off of BiPAP. She states that she began feeling sick last night had episode of vomiting and diarrhea. She continues to feel extremely weak and sick. She had not noted cough prior to now. She states that she had pneumonia in March April and May. She confirms that she is a DO NOT RESUSCITATE does not wish intubation or chest compressions. Past Medical History:  Diagnosis Date  . Arthritis   . Asthma   . Breast cancer ([place]) 2006   right w/lumpectomy  . COPD (chronic obstructive pulmonary disease) ([place])   . Depression   . Diabetes ([place]) 2007  . Diverticulosis of colon   . Endometrial hyperplasia 01/1996   fibroids, adenomyosis  . Frequent UTI   . History of colon polyps   . Hypertension   . Insomnia   . Osteopenia 12/08   hip  . Spinal stenosis 2001  . Vitamin D deficiency     Patient Active Problem List   Diagnosis Date Noted  . Mild persistent asthma without complication 48/25/0037  . Morbid obesity due to excess calories ([place]) 09/02/2016  . Esophageal dysfunction 09/01/2016  . PNA (pneumonia) 08/09/2016  . Acute on chronic respiratory  failure with hypoxia ([place]) 08/09/2016  . Wheelchair bound 08/09/2016  . Wound healing, delayed 08/09/2016  . Acute hypoxemic respiratory failure ([place]) 08/09/2016  . HCAP (healthcare-associated pneumonia) 07/07/2016  . Chronic ulcer of right leg ([place]) 07/07/2016  . SIRS (systemic inflammatory response syndrome) ([place]) 05/08/2016  . Community acquired pneumonia 05/08/2016  . Failed total hip arthroplasty ([place]) 07/10/2015  . Breast cancer, right breast ([place]) 07/18/2014  . CONCUSSION WITH LOC OF UNSPECIFIED DURATION 07/19/2009  . OTH SYMPTOMS INVOLVING RESPIRATORY SYSTEM&CHEST 11/12/2008  . UNSPECIFIED DISORDER OF ANKLE AND FOOT JOINT 05/11/2008  . CERUMEN IMPACTION, BILATERAL 05/04/2008  . DYSMETABOLIC SYNDROME X 04/88/8916  . UNS ADVRS EFF UNS RX MEDICINAL&BIOLOGICAL SBSTNC 08/12/2007  . Other and unspecified hyperlipidemia 04/01/2007  . INSOMNIA, PERSISTENT 09/09/2006  . Depression 09/09/2006  . Essential hypertension 09/09/2006  . Asthma, chronic, unspecified asthma severity, with acute exacerbation 09/09/2006  . DIVERTICULOSIS, COLON 09/09/2006  . OSTEOARTHRITIS 09/09/2006  . STENOSIS, LUMBAR SPINE 09/09/2006  . COLONIC POLYPS, HX OF 09/09/2006    Past Surgical History:  Procedure Laterality Date  . BILATERAL SALPINGOOPHORECTOMY  12/97  . BREAST LUMPECTOMY Right 03/2003  . CARPAL TUNNEL RELEASE    . CERVICAL FUSION    . CERVICAL LAMINECTOMY  1975   x4  . CHOLECYSTECTOMY  11/00   lap chole  . HYSTEROSCOPY  10/97   D&C, postmenopausal bleeding  . LUMBAR LAMINECTOMY  10/06   with spinal stenosis  .  manipulation of hip for dislocation    . ROTATOR CUFF REPAIR Right 11/96  . TONSILLECTOMY AND ADENOIDECTOMY    . TOTAL ABDOMINAL HYSTERECTOMY  12/97   endo hyperplasia, fibroids, adenomyosis  . TOTAL HIP ARTHROPLASTY Right 8/00  . TOTAL HIP REVISION Right 07/10/2015   Procedure: RIGHT ACETABULAR VS TOTAL HIP REVISION;  Surgeon: Gaynelle Arabian, MD;  Location: [facility];  Service:  Orthopedics;  Laterality: Right;  . TOTAL KNEE ARTHROPLASTY Left 06/2003  . TOTAL KNEE ARTHROPLASTY Right 10/06  . TUBAL LIGATION Bilateral 1968    OB History    Gravida Para Term Preterm AB Living   3 3 3          SAB TAB Ectopic Multiple Live Births                   Home Medications    Prior to Admission medications   Medication Sig Start Date End Date Taking? Authorizing Provider  acetaminophen (TYLENOL) 325 MG tablet Take 2 tablets (650 mg total) by mouth every 6 (six) hours as needed for mild pain (or Fever >/= 101). 07/12/15   Perkins, Alexzandrew L, PA-C  albuterol (PROAIR HFA) 108 (90 BASE) MCG/ACT inhaler Inhale 2 puffs into the lungs every 4 (four) hours as needed for shortness of breath.     [provider]  albuterol (PROVENTIL) (2.5 MG/3ML) 0.083% nebulizer solution Take 2.5 mg by nebulization every 4 (four) hours as needed for wheezing or shortness of breath.    [provider]  aspirin EC 325 MG tablet Take 325 mg by mouth daily.     [provider]  buPROPion (WELLBUTRIN XL) 300 MG 24 hr tablet Take 300 mg by mouth every morning.     [provider]  Calcium Carb-Cholecalciferol (CALCIUM+D3) 600-800 MG-UNIT TABS Take 1 tablet by mouth daily.    [provider]  Cholecalciferol (VITAMIN D) 2000 units tablet Take 2,000 Units by mouth daily.    [provider]  clonazePAM (KLONOPIN) 1 MG tablet Take 1 tablet (1 mg total) by mouth at bedtime as needed (insomnia). 08/12/16   Florencia Reasons, MD  docusate sodium (COLACE) 100 MG capsule Take 100 mg by mouth daily.    [provider]  escitalopram (LEXAPRO) 10 MG tablet Take 10 mg by mouth daily.  04/20/11   [provider]  fluticasone (FLONASE) 50 MCG/ACT nasal spray Place 1 spray into both nostrils 2 (two) times daily.    [provider]  gabapentin (NEURONTIN) 300 MG capsule Take 1 capsule (300 mg total) by mouth 3 (three) times daily. 11/14/15   Magrinat,  Virgie Dad, MD  Homeopathic Products (ARNICARE [place]NICA) CREA Apply 1 application topically as needed (muscle pain/stiffness).    [provider]  Ibuprofen (ADVIL) 200 MG CAPS Take 400 mg by mouth every 12 (twelve) hours as needed (pain).    [provider]  lactobacillus (FLORANEX/LACTINEX) PACK Take 1 packet (1 g total) by mouth 3 (three) times daily with meals. 08/12/16   Florencia Reasons, MD  methocarbamol (ROBAXIN) 500 MG tablet Take 1 tablet (500 mg total) by mouth every 6 (six) hours as needed for muscle spasms. 07/12/15   Perkins, Alexzandrew L, PA-C  metoprolol tartrate (LOPRESSOR) 25 MG tablet Take 0.5 tablets (12.5 mg total) by mouth 2 (two) times daily. 08/12/16   Florencia Reasons, MD  mirabegron ER (MYRBETRIQ) 25 MG TB24 tablet Take 50 mg by mouth daily.    [provider]  mometasone-formoterol (DULERA) 100-5  MCG/ACT AERO Inhale 2 puffs into the lungs 2 (two) times daily. 09/01/16   Tanda Rockers, MD  montelukast (SINGULAIR) 10 MG tablet Take 10 mg by mouth at bedtime.      [provider]  polyethylene glycol (MIRALAX / GLYCOLAX) packet Take 17 g by mouth daily as needed for mild constipation. 07/12/15   Perkins, Alexzandrew L, PA-C  Propylene Glycol-Glycerin (SOOTHE) 0.6-0.6 % SOLN Apply 1 drop to eye 2 (two) times daily. 11/28/15   Domenic Moras, PA-C    Family History Family History  Problem Relation Age of Onset  . Diabetes Father   . Colon cancer Father 80  . Heart failure Father   . Arthritis Mother   . Thyroid disease Mother   . Osteoporosis Mother   . Diabetes Brother   . Diabetes Brother   . Osteoporosis Maternal Aunt   . Osteoporosis Maternal Grandmother     Social History Social History  Substance Use Topics  . Smoking status: Former Smoker    Packs/day: 0.25    Years: 10.00    Types: Cigarettes    Quit date: 04/24/1990  . Smokeless tobacco: Never Used  . Alcohol use 2.4 oz/week    4 Glasses of wine per week     Comment: occ      Allergies   Oxycodone hcl [oxycodone hcl]; Morphine sulfate; and Sulfamethoxazole   Review of Systems Review of Systems  Unable to perform ROS: Acuity of condition     Physical Exam Updated Vital Signs Temp 98.9 F (37.2 C) (Temporal)   SpO2 99%   Physical Exam  Constitutional: She appears well-developed and well-nourished. She appears distressed.  HENT:  Head: Normocephalic and atraumatic.  Right Ear: External ear normal.  Left Ear: External ear normal.  BiPAP in place with some air leak  Eyes: Pupils are equal, round, and reactive to light.  Neck: Normal range of motion.  Pulmonary/Chest:  Decreased air movement in the front with no wheezes noted on my initial exam  Abdominal: Soft. Bowel sounds are normal.  Musculoskeletal:  She squeezes both hands on command and has no obvious deformity of her bilateral upper extremities Lower extremities she is moving her toes and feet and attempting to flex her knees on my command but does not actually move against gravity. No obvious signs of deformity noted  Neurological: She is alert.  Skin: Skin is warm.  Nursing note and vitals reviewed.    ED Treatments / Results  Labs (all labs ordered are listed, but only abnormal results are displayed) Labs Reviewed - No data to display  EKG  EKG Interpretation  Date/Time:  Sunday November 08 2016 08:37:04 EDT Ventricular Rate:  101 PR Interval:    QRS Duration: 102 QT Interval:  305 QTC Calculation: 396 R Axis:   29 Text Interpretation:  Sinus tachycardia Biatrial enlargement RSR' in V1 or V2, right VCD or RVH Artifact in lead(s) I II III aVR aVL V2  Poor data quality, interpretation may be adversely affected Confirmed by Pattricia Boss [phone]) on 11/08/2016 10:02:09 AM       Radiology Dg Chest Port 1 View  Result Date: 11/08/2016 CLINICAL DATA:  Respiratory distress EXAM: PORTABLE CHEST 1 VIEW COMPARISON:  August 12, 2016 FINDINGS: No pneumothorax. The heart, hila, and  mediastinum are normal. Increasing opacity in the medial right base favored represent infiltrate rather than a vascular crowding. No other interval changes or acute abnormalities. IMPRESSION: Developing infiltrate in the medial right lung base. Recommend  follow-up to resolution. Electronically Signed   By: Dorise Bullion III M.D   On: 11/08/2016 08:54    Procedures Procedures (including critical care time)  Medications Ordered in ED Medications - No data to display   Initial Impression / Assessment and Plan / ED Course  I have reviewed the triage vital signs and the nursing notes.  Pertinent labs & imaging results that were available during my care of the patient were reviewed by me and considered in my medical decision making (see chart for details).     This is an 83 year old lady with COPD presents today with respiratory distress. She has been on BiPAP. She has been satting here appropriately. Chest x-Climmie Cronce reveals an emergent infiltrate in the right lower lobe. Patient has had blood cultures obtained and antibiotics are ordered. Blood gases currently pending. 10:23 AM cxr reviewed.  Will trial off BiPAP. Vitals:   11/08/16 0930 11/08/16 0945  BP: 130/87 130/76  Pulse: (!) 104 (!) 117  Resp: 15 (!) 24  Temp:    SpO2: 99% 100%    ABG reviewed 1-respiratory failure- copd exacerbation and rll pneumonia- patient treated with albuterol, solumedrol, zosyn, bipap- now weaned. Patient's speaking in multi word phrases. Sats on nasal cannula at 95%. Cough productive of yellowish bloody sputum while I am at bedside. 2- hyperkalemia- patient received iv fluids and will need recheck. 3- DNR  CRITICAL CARE Performed by: Gustav Knueppel S Total critical care time: 45 minutes Critical care time was exclusive of separately billable procedures and treating other patients. Critical care was necessary to treat or prevent imminent or life-threatening deterioration. Critical care was time spent  personally by me on the following activities: development of treatment plan with patient and/or surrogate as well as nursing, discussions with consultants, evaluation of patient's response to treatment, examination of patient, obtaining history from patient or surrogate, ordering and performing treatments and interventions, ordering and review of laboratory studies, ordering and review of radiographic studies, pulse oximetry and re-evaluation of patient's condition.  Discussed with Ms. Wertman and plan admission to stepdown  12:32 PM Patient has been admitted to hospitalist. However, round and department department, I noted patient's blood pressure low at 88/57. Review of last several blood pressures shows that she has been running in the 80s now. Patient is awake and alert. I discussed with Dr. Nehemiah Settle. She had 1500 mL of normal saline ordered prior to this. She had an initial normal lactic acid. Additional thousand cc of normal saline is ordered by Dr. Nehemiah Settle. The patient is having her repeat lactic acid drawn now. Final Clinical Impressions(s) / ED Diagnoses   Final diagnoses:  COPD exacerbation ([place])  Acute respiratory failure, unspecified whether with hypoxia or hypercapnia ([place])  Community acquired pneumonia of right lower lobe of lung [facility])    New Prescriptions New Prescriptions   No medications on file     Pattricia Boss, MD 11/08/16 1112    Pattricia Boss, MD 11/08/16 1233

## 2016-11-08 NOTE — Progress Notes (Signed)
Placed patient on BiPAP via Servo I, patient tolerated well.

## 2016-11-09 ENCOUNTER — Inpatient Hospital Stay (HOSPITAL_COMMUNITY): Payer: Medicare Other

## 2016-11-09 DIAGNOSIS — J181 Lobar pneumonia, unspecified organism: Secondary | ICD-10-CM

## 2016-11-09 DIAGNOSIS — A419 Sepsis, unspecified organism: Principal | ICD-10-CM

## 2016-11-09 DIAGNOSIS — J441 Chronic obstructive pulmonary disease with (acute) exacerbation: Secondary | ICD-10-CM

## 2016-11-09 DIAGNOSIS — N390 Urinary tract infection, site not specified: Secondary | ICD-10-CM

## 2016-11-09 DIAGNOSIS — J9600 Acute respiratory failure, unspecified whether with hypoxia or hypercapnia: Secondary | ICD-10-CM

## 2016-11-09 LAB — COMPREHENSIVE METABOLIC PANEL
ALBUMIN: 2.8 g/dL — AB (ref 3.5–5.0)
ALT: 16 U/L (ref 14–54)
ANION GAP: 3 — AB (ref 5–15)
AST: 18 U/L (ref 15–41)
Alkaline Phosphatase: 53 U/L (ref 38–126)
BILIRUBIN TOTAL: 0.6 mg/dL (ref 0.3–1.2)
BUN: 7 mg/dL (ref 6–20)
CO2: 25 mmol/L (ref 22–32)
Calcium: 8.5 mg/dL — ABNORMAL LOW (ref 8.9–10.3)
Chloride: 109 mmol/L (ref 101–111)
Creatinine, Ser: 0.5 mg/dL (ref 0.44–1.00)
GFR calc Af Amer: >60 mL/min (ref >60)
GFR calc non Af Amer: >60 mL/min (ref >60)
GLUCOSE: 201 mg/dL — AB (ref 65–99)
POTASSIUM: 3.3 mmol/L — AB (ref 3.5–5.1)
SODIUM: 137 mmol/L (ref 135–145)
TOTAL PROTEIN: 5.3 g/dL — AB (ref 6.5–8.1)

## 2016-11-09 LAB — CBC
HEMATOCRIT: 37.4 % (ref 36.0–46.0)
HEMOGLOBIN: 12 g/dL (ref 12.0–15.0)
MCH: 30.9 pg (ref 26.0–34.0)
MCHC: 32.1 g/dL (ref 30.0–36.0)
MCV: 96.4 fL (ref 78.0–100.0)
Platelets: 119 10*3/uL — ABNORMAL LOW (ref 150–400)
RBC: 3.88 MIL/uL (ref 3.87–5.11)
RDW: 13.5 % (ref 11.5–15.5)
WBC: 15.2 10*3/uL — ABNORMAL HIGH (ref 4.0–10.5)

## 2016-11-09 LAB — PROTIME-INR
INR: 1.13
Prothrombin Time: 14.4 seconds (ref 11.4–15.2)

## 2016-11-09 LAB — LACTIC ACID, PLASMA: Lactic Acid, Venous: 1.1 mmol/L (ref 0.5–1.9)

## 2016-11-09 MED ORDER — SODIUM CHLORIDE 0.9 % IV BOLUS (SEPSIS)
500.0000 mL | Freq: Once | INTRAVENOUS | Status: AC
  Administered 2016-11-09: 500 mL via INTRAVENOUS

## 2016-11-09 MED ORDER — HYDROCODONE-ACETAMINOPHEN 5-325 MG PO TABS
1.0000 | ORAL_TABLET | Freq: Two times a day (BID) | ORAL | Status: DC
  Administered 2016-11-09 – 2016-11-10 (×3): 1 via ORAL
  Filled 2016-11-09 (×4): qty 1

## 2016-11-09 MED ORDER — BUPROPION HCL ER (XL) 150 MG PO TB24
300.0000 mg | ORAL_TABLET | Freq: Every day | ORAL | Status: DC
  Administered 2016-11-09 – 2016-11-10 (×2): 300 mg via ORAL
  Filled 2016-11-09 (×2): qty 2

## 2016-11-09 MED ORDER — IPRATROPIUM-ALBUTEROL 0.5-2.5 (3) MG/3ML IN SOLN
3.0000 mL | Freq: Two times a day (BID) | RESPIRATORY_TRACT | Status: DC
  Administered 2016-11-09: 3 mL via RESPIRATORY_TRACT
  Filled 2016-11-09: qty 3

## 2016-11-09 NOTE — NC FL2 (Addendum)
[place] LEVEL OF CARE SCREENING TOOL     IDENTIFICATION  Patient Name: [name] Birthdate: 1930-10-29 Sex: female Admission Date (Current Location): 11/08/2016  [facility] and [place] Number:  Herbalist and Address:  [facility] [place] [address], [place], [place] [address]      Provider Number: [id number]  Attending Physician Name and Address:  Rosita Fire, MD  Relative Name and Phone Number:       Current Level of Care: Hospital Recommended Level of Care: ALF Prior Approval Number:    Date Approved/Denied:   PASRR Number: [id number]  Discharge Plan: ALF    Current Diagnoses: Patient Active Problem List   Diagnosis Date Noted  . COPD exacerbation ([place])   . Acute lower UTI   . Acute respiratory distress 11/08/2016  . Sepsis ([place]) 11/08/2016  . Mild persistent asthma without complication 52/84/1324  . Morbid obesity due to excess calories ([place]) 09/02/2016  . Esophageal dysfunction 09/01/2016  . PNA (pneumonia) 08/09/2016  . Acute respiratory failure ([place]) 08/09/2016  . Wheelchair bound 08/09/2016  . Wound healing, delayed 08/09/2016  . Acute hypoxemic respiratory failure ([place]) 08/09/2016  . HCAP (healthcare-associated pneumonia) 07/07/2016  . Chronic ulcer of right leg ([place]) 07/07/2016  . SIRS (systemic inflammatory response syndrome) ([place]) 05/08/2016  . Community acquired pneumonia 05/08/2016  . Failed total hip arthroplasty ([place]) 07/10/2015  . Breast cancer, right breast ([place]) 07/18/2014  . CONCUSSION WITH LOC OF UNSPECIFIED DURATION 07/19/2009  . OTH SYMPTOMS INVOLVING RESPIRATORY SYSTEM&CHEST 11/12/2008  . UNSPECIFIED DISORDER OF ANKLE AND FOOT JOINT 05/11/2008  . CERUMEN IMPACTION, BILATERAL 05/04/2008  . Dysmetabolic syndrome X 40/11/2723  . UNS ADVRS EFF UNS RX MEDICINAL&BIOLOGICAL SBSTNC 08/12/2007  . Other and unspecified hyperlipidemia 04/01/2007  . INSOMNIA, PERSISTENT 09/09/2006  .  Depression 09/09/2006  . Essential hypertension 09/09/2006  . Asthma, chronic, unspecified asthma severity, with acute exacerbation 09/09/2006  . DIVERTICULOSIS, COLON 09/09/2006  . Osteoarthritis 09/09/2006  . STENOSIS, LUMBAR SPINE 09/09/2006  . COLONIC POLYPS, HX OF 09/09/2006    Orientation RESPIRATION BLADDER Height & Weight     Self, Time, Situation, Place   External catheter, Incontinent Weight: 212 lb 8.4 oz (96.4 kg) Height:  5\' 7"  (170.2 cm)  BEHAVIORAL SYMPTOMS/MOOD NEUROLOGICAL BOWEL NUTRITION STATUS      Continent Heart Healthy- no added salt  AMBULATORY STATUS COMMUNICATION OF NEEDS Skin   Limited Assist Verbally PU Stage and Appropriate Care PU Stage 1 Dressing:  (bilateral heel wounds- foam dressing)                     Personal Care Assistance Level of Assistance  Bathing, Dressing Bathing Assistance: Limited assistance   Dressing Assistance: Limited assistance     Functional Limitations Info             SPECIAL CARE FACTORS FREQUENCY  PT (By licensed PT), OT (By licensed OT)     PT Frequency: 5/wk with home health OT Frequency: 5/wk with home health            Contractures      Additional Factors Info  Code Status, Allergies, Psychotropic                Discharge Medications: TAKE these medications           acetaminophen 650 MG CR tablet Commonly known as:  TYLENOL Take 650-1,300 mg by mouth every 12 (twelve) hours as needed for pain. What  changed:  Another medication with the same name was removed. Continue taking this medication, and follow the directions you see here.    acetaminophen 325 MG tablet Commonly known as:  TYLENOL Take 2 tablets (650 mg total) by mouth every 6 (six) hours as needed for mild pain (or Fever >/= 101). What changed:  Another medication with the same name was removed. Continue taking this medication, and follow the directions you see here.    ADVIL 200 MG tablet Generic drug:  ibuprofen Take  200 mg by mouth every 12 (twelve) hours as needed (pain).    aspirin EC 325 MG tablet Take 325 mg by mouth daily.    azithromycin 500 MG tablet Commonly known as:  ZITHROMAX Take 1 tablet (500 mg total) by mouth daily. Take 1 tablet daily for 3 days.    benzonatate 200 MG capsule Commonly known as:  TESSALON Take 200 mg by mouth 3 (three) times daily as needed for cough.    buPROPion 300 MG 24 hr tablet Commonly known as:  WELLBUTRIN XL Take 300 mg by mouth every morning.    CALCIUM+D3 600-800 MG-UNIT Tabs Generic drug:  Calcium Carb-Cholecalciferol Take 1 tablet by mouth daily.    CEPACOL SORE THROAT 15-2.6 MG Lozg Generic drug:  Benzocaine-Menthol Use as directed 1 lozenge in the mouth or throat every 4 (four) hours as needed (for sore throat).    cephALEXin 500 MG capsule Commonly known as:  KEFLEX Take 1 capsule (500 mg total) by mouth 2 (two) times daily.    ciclopirox 8 % solution Commonly known as:  PENLAC Apply 1 application topically every Tuesday. Apply over nail and surrounding skin. Apply daily over previous coat. After seven (7) days, may remove with alcohol and continue cycle.    clonazePAM 1 MG tablet Commonly known as:  KLONOPIN Take 1 tablet (1 mg total) by mouth at bedtime as needed (insomnia). What changed:  when to take this    docusate sodium 100 MG capsule Commonly known as:  COLACE Take 100 mg by mouth daily.    DULERA 200-5 MCG/ACT Aero Generic drug:  mometasone-formoterol Inhale 1 puff into the lungs 2 (two) times daily. What changed:  Another medication with the same name was removed. Continue taking this medication, and follow the directions you see here.    escitalopram 10 MG tablet Commonly known as:  LEXAPRO Take 10 mg by mouth daily.    fluticasone 50 MCG/ACT nasal spray Commonly known as:  FLONASE Place 1 spray into both nostrils 2 (two) times daily.    gabapentin 300 MG capsule Commonly known as:  NEURONTIN Take 1  capsule (300 mg total) by mouth 3 (three) times daily.    guaiFENesin 600 MG 12 hr tablet Commonly known as:  MUCINEX Take 600 mg by mouth 2 (two) times daily as needed for cough.    hydrochlorothiazide 12.5 MG capsule Commonly known as:  MICROZIDE Take 12.5 mg by mouth daily.    HYDROcodone-acetaminophen 5-325 MG tablet Commonly known as:  NORCO/VICODIN Take 1 tablet by mouth 2 (two) times daily as needed for moderate pain.    lactobacillus Pack Take 1 packet (1 g total) by mouth 3 (three) times daily with meals.    methocarbamol 500 MG tablet Commonly known as:  ROBAXIN Take 1 tablet (500 mg total) by mouth every 6 (six) hours as needed for muscle spasms.    metoprolol tartrate 25 MG tablet Commonly known as:  LOPRESSOR Take 0.5 tablets (12.5 mg  total) by mouth 2 (two) times daily.    montelukast 10 MG tablet Commonly known as:  SINGULAIR Take 10 mg by mouth at bedtime.    MYRBETRIQ 50 MG Tb24 tablet Generic drug:  mirabegron ER Take 50 mg by mouth daily.    ondansetron 4 MG tablet Commonly known as:  ZOFRAN Take 4 mg by mouth every 8 (eight) hours as needed for nausea or vomiting.    polyethylene glycol packet Commonly known as:  MIRALAX / GLYCOLAX Take 17 g by mouth daily as needed for mild constipation.    PROAIR HFA 108 (90 Base) MCG/ACT inhaler Generic drug:  albuterol Inhale 2 puffs into the lungs every 4 (four) hours as needed for shortness of breath.    Propylene Glycol-Glycerin 0.6-0.6 % Soln Commonly known as:  SOOTHE Apply 1 drop to eye 2 (two) times daily.    sodium phosphate 7-19 GM/118ML Enem Place 1 enema rectally daily as needed for severe constipation.    Vitamin D 2000 units tablet Take 2,000 Units by mouth daily.      Relevant Imaging Results:  Relevant Lab Results:   Additional Information SSN: [id number]; will need home health at ALF  Elouise Divelbiss, Connye Burkitt, LCSW

## 2016-11-09 NOTE — Consult Note (Addendum)
[place] Nurse wound consult note Reason for Consult: Consult requested for right leg wound.  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 Wound type: Full thickness wounds after procedure for removal was recently performed to right calf Measurement: .2X.2X.1cm and 3X1.2X.1cm Wound bed: both sites are dark red without odor or drainage Dressing procedure/placement/frequency: Applied Vaseline gauze and foam dressing and topical treatment orders provided for bedside nurse use. Please re-consult if further assistance is needed.  Thank-you,  Julien Girt MSN, [place], [place], [place], [place]

## 2016-11-09 NOTE — Clinical Social Work Note (Signed)
Clinical Social Work Assessment  Patient Details  Name: [name] MRN: [id number] Date of Birth: 1930/12/21  Date of referral:  11/09/16               Reason for consult:  Facility Placement                Permission sought to share information with:  Facility Sport and exercise psychologist, Family Supports Permission granted to share information::  Yes, Verbal Permission Granted  Name::     [name]::  SNF  Relationship::  dtr  Contact Information:     Housing/Transportation Living arrangements for the past 2 months:  [place] of Information:  Patient Patient Interpreter Needed:  None Criminal Activity/Legal Involvement Pertinent to Current Situation/Hospitalization:  No - Comment as needed Significant Relationships:  Adult Children Lives with:  Facility Resident Do you feel safe going back to the place where you live?  No Need for family participation in patient care:  Yes (Comment) (pt relies on dtr for help with decision making)  Care giving concerns: Pt lives at [facility]- currently weaker than normal and unsure if return to ALF is best choice for her at this time.   Social Worker assessment / plan:  CSW spoke with patient concerning plan for time of DC.  Pt confirmed she is a resident at [facility].  CSW explained PT recommendation for SNF and patient choice to return to ALF with home health services or go to rehab center for more intensive rehab.  Employment status:  Retired Nurse, adult PT Recommendations:  [place] / Referral to community resources:  [place]  Patient/Family's Response to care:  Patient agreeable to CSW beginning search into rehab but wants to speak with daughter before making any decisions.  Patient/Family's Understanding of and Emotional Response to Diagnosis, Current Treatment, and Prognosis:  Pt seems to have good comprehension of her current state and  realistic about her current limitations.  Emotional Assessment Appearance:  Appears stated age Attitude/Demeanor/Rapport:    Affect (typically observed):  Appropriate Orientation:  Oriented to Self, Oriented to Place, Oriented to  Time, Oriented to Situation Alcohol / Substance use:  Not Applicable Psych involvement (Current and /or in the community):  No (Comment)  Discharge Needs  Concerns to be addressed:  Care Coordination Readmission within the last 30 days:  No Current discharge risk:  Physical Impairment Barriers to Discharge:  Continued Medical Work up   Jorge Ny, LCSW 11/09/2016, 1:55 PM

## 2016-11-09 NOTE — Progress Notes (Addendum)
PROGRESS NOTE    [name]  NLZ:[id number] DOB: [birth year] DOA: 11/08/2016 PCP: Reynold Bowen, MD   Brief Narrative:  83 y.o. female with a history of COPD not on oxygen at home, diabetes, hypertension, history of breast cancer, prior history of pneumonia, last event in June of this year, brought by EMS from assisted living facility, after being found very short of breath, coughing, respiratory distress.  Assessment & Plan:   # Sepsis due to right lower lobe pneumonia and urinary tract infection: -Patient with fever, leukocytosis, lactic acid level of 6 on admission.Patient with elevated pro-calcitonin level. -MRSA PCR negative. Discontinue IV vancomycin. Plan to continue azithromycin and cefepime. Lactate level improved. Leukocytosis trending down. Monitor labs and follow up culture results. -Reduced IVF to 50 cc/hr  #Right lower lobe pneumonia: Continue antibiotics. Need repeat x-ray in 3-4 weeks after antibiotic treatment.  #Likely acute cystitis without hematuria: Follow up culture results. Continue antibiotics. Patient with dysuria or urgency.  #Likely acute COPD exacerbation: Continue steroid, duoneb. On antibiotics for pneumonia. -Continue Singulair, dulera  #Hypertension: Monitor blood pressure. HCTZ On hold.   # Anxiety depression:: Continue Klonopin, Lexapro and Wellbutrin  #Chronic back pain: Resume home dose of Norco as needed.on colace  #Thrombocytopenia likely in the setting of sepsis. Discontinue heparin subcutaneous.  PT OT evaluation. Social worker consulted for discharge planning.  DVT prophylaxis:SCD  Code Status:DO NOT RESUSCITATE  Family Communication:No family at bedside  Disposition Plan:Currently admitted    Consultants:   None  Procedures:None  Antimicrobials:Cefepime and azithromycin.  Subjective: Seen and examined at bedside. Reported dysuria or urgency. Combining of chronic back pain and asking per her home medication Norco. Denied  headache, dizziness, nausea or vomiting. Still complaining of SOB and cough but better than yesterday, not at baseline.  Objective: Vitals:   11/09/16 0738 11/09/16 0818 11/09/16 1011 11/09/16 1225  BP:  135/63 (!) 145/86 109/62  Pulse: 86 80 87 (!) 50  Resp: 18 20  15   Temp:  98.3 F (36.8 C)    TempSrc:  Oral    SpO2: 97% 97%    Weight:      Height:        Intake/Output Summary (Last 24 hours) at 11/09/16 1313 Last data filed at 11/09/16 0600  Gross per 24 hour  Intake             2495 ml  Output             2000 ml  Net              495 ml   Filed Weights   11/08/16 0932 11/08/16 1349 11/09/16 0404  Weight: 85 kg (187 lb 6.3 oz) 91.4 kg (201 lb 8 oz) 96.4 kg (212 lb 8.4 oz)    Examination:  General exam: Appears calm and comfortable  Respiratory system:Bibasal decreased breath sound, respiratory effort normal. No wheezing  Cardiovascular system: S1 & S2 heard, RRR.  No pedal edema. Gastrointestinal system: Abdomen is nondistended, soft and nontender. Normal bowel sounds heard. Central nervous system: Alertawake and following commands  Extremities: Symmetric 5 x 5 power. Skin: No rashes, lesions or ulcers    Data Reviewed: I have personally reviewed following labs and imaging studies  CBC:  Recent Labs Lab 11/08/16 0855 11/09/16 0156  WBC 10.5 15.2*  NEUTROABS 9.1*  --   HGB 14.6 12.0  HCT 45.7 37.4  MCV 96.8 96.4  PLT 169 902*   Basic Metabolic Panel:  Recent Labs Lab  11/08/16 0855 11/09/16 0156  NA 138 137  K 5.2* 3.3*  CL 102 109  CO2 27 25  GLUCOSE 107* 201*  BUN 13 7  CREATININE 0.79 0.50  CALCIUM 9.2 8.5*   GFR: Estimated Creatinine Clearance: 62.4 mL/min (by C-G formula based on SCr of 0.5 mg/dL). Liver Function Tests:  Recent Labs Lab 11/08/16 0855 11/09/16 0156  AST 35 18  ALT 24 16  ALKPHOS 91 53  BILITOT 1.2 0.6  PROT 6.8 5.3*  ALBUMIN 4.0 2.8*   No results for input(s): LIPASE, AMYLASE in the last 168 hours. No  results for input(s): AMMONIA in the last 168 hours. Coagulation Profile:  Recent Labs Lab 11/09/16 0156  INR 1.13   Cardiac Enzymes:  Recent Labs Lab 11/08/16 0855  TROPONINI <0.03   BNP (last 3 results) No results for input(s): PROBNP in the last 8760 hours. HbA1C: No results for input(s): HGBA1C in the last 72 hours. CBG: No results for input(s): GLUCAP in the last 168 hours. Lipid Profile: No results for input(s): CHOL, HDL, LDLCALC, TRIG, CHOLHDL, LDLDIRECT in the last 72 hours. Thyroid Function Tests: No results for input(s): TSH, T4TOTAL, FREET4, T3FREE, THYROIDAB in the last 72 hours. Anemia Panel: No results for input(s): VITAMINB12, FOLATE, FERRITIN, TIBC, IRON, RETICCTPCT in the last 72 hours. Sepsis Labs:  Recent Labs Lab 11/08/16 1356 11/08/16 1803 11/08/16 2250 11/09/16 0156  PROCALCITON  --  5.19  --   --   LATICACIDVEN 4.5* 6.0* 2.8* 1.1    Recent Results (from the past 240 hour(s))  Urine culture     Status: Abnormal (Preliminary result)   Collection Time: 11/08/16  9:22 AM  Result Value Ref Range Status   Specimen Description URINE, RANDOM  Final   Special Requests NONE  Final   Culture >=100,000 COLONIES/mL GRAM NEGATIVE RODS (A)  Final   Report Status PENDING  Incomplete  MRSA PCR Screening     Status: None   Collection Time: 11/08/16  2:27 PM  Result Value Ref Range Status   MRSA by PCR NEGATIVE NEGATIVE Final    Comment:        The GeneXpert MRSA Assay (FDA approved for NASAL specimens only), is one component of a comprehensive MRSA colonization surveillance program. It is not intended to diagnose MRSA infection nor to guide or monitor treatment for MRSA infections.          Radiology Studies: Dg Chest Port 1 View  Result Date: 11/09/2016 CLINICAL DATA:  Sepsis. EXAM: PORTABLE CHEST 1 VIEW COMPARISON:  Radiograph of November 08, 2016. FINDINGS: Stable cardiomediastinal silhouette. No pneumothorax or pleural effusion is  noted. Left lung is clear. Stable right infrahilar density is noted concerning for pneumonia, but underlying mass lesion cannot be excluded. Bony thorax is unremarkable. IMPRESSION: Stable right infrahilar density is noted concerning for pneumonia, but underlying mass lesion cannot be excluded. Followup PA and lateral chest X-ray is recommended in 3-4 weeks following trial of antibiotic therapy to ensure resolution and exclude underlying malignancy. Electronically Signed   By: Marijo Conception, M.D.   On: 11/09/2016 07:36   Dg Chest Port 1 View  Result Date: 11/08/2016 CLINICAL DATA:  Respiratory distress EXAM: PORTABLE CHEST 1 VIEW COMPARISON:  August 12, 2016 FINDINGS: No pneumothorax. The heart, hila, and mediastinum are normal. Increasing opacity in the medial right base favored represent infiltrate rather than a vascular crowding. No other interval changes or acute abnormalities. IMPRESSION: Developing infiltrate in the medial right lung base. Recommend  follow-up to resolution. Electronically Signed   By: Dorise Bullion III M.D   On: 11/08/2016 08:54        Scheduled Meds: . aspirin EC  325 mg Oral Daily  . buPROPion  300 mg Oral Daily  . calcium-vitamin D  3 tablet Oral Daily  . cholecalciferol  2,000 Units Oral Daily  . docusate sodium  100 mg Oral Daily  . escitalopram  10 mg Oral Daily  . fluticasone  1 spray Each Nare BID  . gabapentin  300 mg Oral TID  . heparin  5,000 Units Subcutaneous Q8H  . HYDROcodone-acetaminophen  1 tablet Oral Q12H  . Influenza vac split quadrivalent PF  0.5 mL Intramuscular Tomorrow-1000  . ipratropium-albuterol  3 mL Nebulization BID  . methylPREDNISolone (SOLU-MEDROL) injection  60 mg Intravenous Q12H  . metoprolol tartrate  12.5 mg Oral BID  . mirabegron ER  50 mg Oral Daily  . mometasone-formoterol  1 puff Inhalation BID  . montelukast  10 mg Oral QHS  . polyvinyl alcohol  1 drop Both Eyes BID   Continuous Infusions: . sodium chloride 100 mL/hr  at 11/09/16 0153  . azithromycin Stopped (11/08/16 1519)  . ceFEPime (MAXIPIME) IV Stopped (11/09/16 9937)  . vancomycin Stopped (11/09/16 1225)     LOS: 1 day    Sia Gabrielsen Tanna Furry, MD [facility] Pager [phone]  If 7PM-7AM, please contact night-coverage [email or web address] Password [email or web address] 11/09/2016, 1:13 PM

## 2016-11-09 NOTE — Evaluation (Signed)
Physical Therapy Evaluation Patient Details Name: [name] MRN: [id number] DOB: 1930-03-28 Today's Date: 11/09/2016   History of Present Illness  Pt adm with Acute respiratory distress in the setting of  Sepsis vs. Sirs likely due to Pneumonia and Urine source. PMH - copd, dm, htn, breast CA, arthritis, cervical fusion, rt thr, bil tkr, back surgery  Clinical Impression  Pt admitted with above diagnosis and presents to PT with functional limitations due to deficits listed below (See PT problem list). Pt needs skilled PT to maximize independence and safety to allow discharge to SNF. If patient progress rapidly back to baseline she may be able to return to ALF     Follow Up Recommendations SNF (unless rapidly back to baseline)    Equipment Recommendations  None recommended by PT    Recommendations for Other Services       Precautions / Restrictions Precautions Precautions: Fall Restrictions Weight Bearing Restrictions: No      Mobility  Bed Mobility Overal bed mobility: Needs Assistance Bed Mobility: Supine to Sit     Supine to sit: Mod assist     General bed mobility comments: Pt could come up to sit with min A but then needed Mod A to scoot forward with use of bed pad  Transfers Overall transfer level: Needs assistance Equipment used: Rolling walker (2 wheeled) Transfers: Sit to/from Stand Sit to Stand: Min assist         General transfer comment: posterior bias  Ambulation/Gait Ambulation/Gait assistance: Min assist Ambulation Distance (Feet): 10 Feet Assistive device: Rolling walker (2 wheeled) Gait Pattern/deviations: Step-through pattern;Decreased step length - right;Decreased step length - left;Trendelenburg Gait velocity: decr Gait velocity interpretation: Below normal speed for age/gender General Gait Details: Assist for balance and support.   Stairs            Wheelchair Mobility    Modified Rankin (Stroke Patients Only)        Balance Overall balance assessment: Needs assistance Sitting-balance support: No upper extremity supported;Feet supported Sitting balance-Leahy Scale: Good     Standing balance support: Bilateral upper extremity supported Standing balance-Leahy Scale: Poor Standing balance comment: walker and min assist with intermittent posterior bias                             Pertinent Vitals/Pain Pain Assessment: No/denies pain    Home Living Family/patient expects to be discharged to:: [place]: Wheelchair - [employer] - 2 wheels;Toilet riser Additional Comments: [facility]    Prior Function Level of Independence: Needs assistance   Gait / Transfers Assistance Needed: self propels her w/c; walks with RW with someone with her up to 100 feet  ADL's / Homemaking Assistance Needed: Pt reports she does all her B/D staff just supervise her when she is in shower  Comments: pt states she can call staff for assistance     Hand Dominance   Dominant Hand: Right    Extremity/Trunk Assessment   Upper Extremity Assessment Upper Extremity Assessment: Defer to OT evaluation    Lower Extremity Assessment Lower Extremity Assessment: Generalized weakness       Communication   Communication: No difficulties  Cognition Arousal/Alertness: Awake/alert Behavior During Therapy: WFL for tasks assessed/performed Overall Cognitive Status: Within Functional Limits for tasks assessed  General Comments      Exercises     Assessment/Plan    PT Assessment Patient needs continued PT services  PT Problem List Decreased strength;Decreased activity tolerance;Decreased balance;Decreased mobility       PT Treatment Interventions DME instruction;Gait training;Functional mobility training;Therapeutic activities;Therapeutic exercise;Balance training;Patient/family education     PT Goals (Current goals can be found in the Care Plan section)  Acute Rehab PT Goals Patient Stated Goal: to go to rehab ([facility]) then home to [facility] PT Goal Formulation: With patient Time For Goal Achievement: 11/16/16 Potential to Achieve Goals: Good    Frequency Min 3X/week   Barriers to discharge        Co-evaluation PT/OT/SLP Co-Evaluation/Treatment: Yes Reason for Co-Treatment: For patient/therapist safety PT goals addressed during session: Mobility/safety with mobility OT goals addressed during session: ADL's and self-care;Strengthening/ROM       AM-PAC PT "6 Clicks" Daily Activity  Outcome Measure Difficulty turning over in bed (including adjusting bedclothes, sheets and blankets)?: A Little Difficulty moving from lying on back to sitting on the side of the bed? : Unable Difficulty sitting down on and standing up from a chair with arms (e.g., wheelchair, bedside commode, etc,.)?: Unable Help needed moving to and from a bed to chair (including a wheelchair)?: A Little Help needed walking in hospital room?: A Little Help needed climbing 3-5 steps with a railing? : Total 6 Click Score: 12    End of Session Equipment Utilized During Treatment: Gait belt Activity Tolerance: Patient limited by fatigue Patient left: in chair;with call bell/phone within reach;with chair alarm set Nurse Communication: Mobility status PT Visit Diagnosis: Unsteadiness on feet (R26.81);Other abnormalities of gait and mobility (R26.89)    Time: 2836-6294 PT Time Calculation (min) (ACUTE ONLY): 30 min   Charges:   PT Evaluation $PT Eval Moderate Complexity: 1 Mod     PT G CodesMarland Kitchen        [facility] PT [name] 11/09/2016, 1:22 PM

## 2016-11-09 NOTE — Evaluation (Signed)
Occupational Therapy Evaluation Patient Details Name: [name] MRN: [id number] DOB: 1930/06/18 Today's Date: 11/09/2016    History of Present Illness Pt adm with Acute respiratory distress in the setting of  Sepsis vs. Sirs likely due to Pneumonia and Urine source. PMH - copd, dm, htn, breast CA, arthritis, cervical fusion, rt thr, bil tkr, back surgery   Clinical Impression   This 83 yo female admitted with above presents to acute OT with deficits below (see OT problem list) and thus affecting her PLOF of Mod I to S for basic ADLs. She will benefit from acute OT with follow up OT at SNF to get back to PLOF.   Follow Up Recommendations  SNF;Supervision/Assistance - 24 hour    Equipment Recommendations  None recommended by OT       Precautions / Restrictions Precautions Precautions: Fall Restrictions Weight Bearing Restrictions: No      Mobility Bed Mobility Overal bed mobility: Needs Assistance Bed Mobility: Supine to Sit     Supine to sit: Mod assist     General bed mobility comments: Pt could come up to sit with min A but then needed Mod A to scoot forward with use of bed pad  Transfers Overall transfer level: Needs assistance Equipment used: Rolling walker (2 wheeled) Transfers: Sit to/from Stand Sit to Stand: Min assist         General transfer comment: posterior bias    Balance Overall balance assessment: Needs assistance Sitting-balance support: No upper extremity supported;Feet supported Sitting balance-Leahy Scale: Good     Standing balance support: Bilateral upper extremity supported Standing balance-Leahy Scale: Poor Standing balance comment: intermittent posterior bias                           ADL either performed or assessed with clinical judgement   ADL Overall ADL's : Needs assistance/impaired Eating/Feeding: Independent;Sitting   Grooming: Set up;Sitting   Upper Body Bathing: Set up;Sitting   Lower Body Bathing:  Moderate assistance Lower Body Bathing Details (indicate cue type and reason): min A sit<>stand Upper Body Dressing : Supervision/safety;Set up;Sitting   Lower Body Dressing: Moderate assistance Lower Body Dressing Details (indicate cue type and reason): min A sit<>stand Toilet Transfer: Minimal assistance;Ambulation;RW Toilet Transfer Details (indicate cue type and reason): bed>8 feet with RW Toileting- Clothing Manipulation and Hygiene: Maximal assistance Toileting - Clothing Manipulation Details (indicate cue type and reason): min A sit<>stand             Vision Patient Visual Report: No change from baseline              Pertinent Vitals/Pain Pain Assessment: No/denies pain     Hand Dominance Right   Extremity/Trunk Assessment Upper Extremity Assessment Upper Extremity Assessment: Overall WFL for tasks assessed   Lower Extremity Assessment Lower Extremity Assessment: Defer to PT evaluation       Communication Communication Communication: No difficulties   Cognition Arousal/Alertness: Awake/alert Behavior During Therapy: WFL for tasks assessed/performed Overall Cognitive Status: Within Functional Limits for tasks assessed                                                Home Living Family/patient expects to be discharged to:: Skilled nursing facility  Prior Functioning/Environment Level of Independence: Needs assistance  Gait / Transfers Assistance Needed: self propels her w/c; walks with RW with someone with her up to 100 feet ADL's / Homemaking Assistance Needed: Pt reports she does all her B/D staff just supervise her when she is in shower            OT Problem List: Decreased strength;Impaired balance (sitting and/or standing);Obesity      OT Treatment/Interventions: Self-care/ADL training;Therapeutic activities;Patient/family education;DME and/or AE instruction;Balance  training    OT Goals(Current goals can be found in the care plan section) Acute Rehab OT Goals Patient Stated Goal: to go to rehab ([facility]) then home to [facility] OT Goal Formulation: With patient Time For Goal Achievement: 11/23/16 Potential to Achieve Goals: Good  OT Frequency: Min 2X/week           Co-evaluation PT/OT/SLP Co-Evaluation/Treatment: Yes Reason for Co-Treatment: For patient/therapist safety;To address functional/ADL transfers   OT goals addressed during session: ADL's and self-care;Strengthening/ROM      AM-PAC PT "6 Clicks" Daily Activity     Outcome Measure Help from another person eating meals?: None Help from another person taking care of personal grooming?: A Little Help from another person toileting, which includes using toliet, bedpan, or urinal?: A Lot Help from another person bathing (including washing, rinsing, drying)?: A Lot Help from another person to put on and taking off regular upper body clothing?: A Little Help from another person to put on and taking off regular lower body clothing?: A Lot 6 Click Score: 16   End of Session Equipment Utilized During Treatment: Gait belt;Rolling walker Nurse Communication: Mobility status  Activity Tolerance: Patient tolerated treatment well Patient left: in chair;with call bell/phone within reach;with chair alarm set  OT Visit Diagnosis: Unsteadiness on feet (R26.81);Other abnormalities of gait and mobility (R26.89);Muscle weakness (generalized) (M62.81)                Time: 1005-1036 OT Time Calculation (min): 31 min Charges:  OT General Charges $OT Visit: 1 Visit OT Evaluation $OT Eval Moderate Complexity: [address] [place] [phone] 11/09/2016

## 2016-11-10 DIAGNOSIS — R0603 Acute respiratory distress: Secondary | ICD-10-CM

## 2016-11-10 LAB — URINE CULTURE

## 2016-11-10 LAB — CBC
HEMATOCRIT: 37.9 % (ref 36.0–46.0)
Hemoglobin: 12.4 g/dL (ref 12.0–15.0)
MCH: 31.3 pg (ref 26.0–34.0)
MCHC: 32.7 g/dL (ref 30.0–36.0)
MCV: 95.7 fL (ref 78.0–100.0)
Platelets: 126 10*3/uL — ABNORMAL LOW (ref 150–400)
RBC: 3.96 MIL/uL (ref 3.87–5.11)
RDW: 13.5 % (ref 11.5–15.5)
WBC: 11.3 10*3/uL — ABNORMAL HIGH (ref 4.0–10.5)

## 2016-11-10 LAB — BASIC METABOLIC PANEL
Anion gap: 5 (ref 5–15)
BUN: 12 mg/dL (ref 6–20)
CALCIUM: 9.1 mg/dL (ref 8.9–10.3)
CO2: 25 mmol/L (ref 22–32)
CREATININE: 0.53 mg/dL (ref 0.44–1.00)
Chloride: 109 mmol/L (ref 101–111)
GFR calc Af Amer: >60 mL/min (ref >60)
GFR calc non Af Amer: >60 mL/min (ref >60)
GLUCOSE: 144 mg/dL — AB (ref 65–99)
Potassium: 4.4 mmol/L (ref 3.5–5.1)
Sodium: 139 mmol/L (ref 135–145)

## 2016-11-10 LAB — PROCALCITONIN: Procalcitonin: 3.76 ng/mL

## 2016-11-10 LAB — GLUCOSE, CAPILLARY: Glucose-Capillary: 159 mg/dL — ABNORMAL HIGH (ref 65–99)

## 2016-11-10 MED ORDER — AZITHROMYCIN 500 MG PO TABS: 500.0000 mg | ORAL_TABLET | Freq: Every day | ORAL | 0 refills | Status: DC

## 2016-11-10 MED ORDER — AZITHROMYCIN 500 MG PO TABS: 500.0000 mg | ORAL_TABLET | Freq: Every day | ORAL | 0 refills | Status: AC

## 2016-11-10 MED ORDER — CEPHALEXIN 500 MG PO CAPS: 500.0000 mg | ORAL_CAPSULE | Freq: Two times a day (BID) | ORAL | 0 refills | Status: DC

## 2016-11-10 MED ORDER — FLORANEX PO PACK: 1.0000 g | PACK | Freq: Three times a day (TID) | ORAL | 0 refills | Status: AC

## 2016-11-10 MED ORDER — POTASSIUM CHLORIDE CRYS ER 20 MEQ PO TBCR
30.0000 meq | EXTENDED_RELEASE_TABLET | Freq: Once | ORAL | Status: AC
  Administered 2016-11-10: 30 meq via ORAL
  Filled 2016-11-10: qty 1

## 2016-11-10 MED ORDER — SODIUM CHLORIDE 0.9% FLUSH: 10.0000 mL | INTRAVENOUS | Status: DC | PRN

## 2016-11-10 MED ORDER — CEPHALEXIN 500 MG PO CAPS: 500.0000 mg | ORAL_CAPSULE | Freq: Two times a day (BID) | ORAL | 0 refills | Status: AC

## 2016-11-10 NOTE — Care Management Note (Signed)
Case Management Note  Patient Details  Name: [name] MRN: [id number] Date of Birth: [birth year]  Subjective/Objective:     From [facility], patient is returning to ALF with [facility] which will be set up by [facility], CSW put on FL2.              Action/Plan: Patient for dc back to [facility] with HH.   Expected Discharge Date:  11/10/16               Expected Discharge Plan:  Assisted Living / Rest Home  In-House Referral:  Clinical Social Work  Discharge planning Services  CM Consult  Post Acute Care Choice:  Home Health Choice offered to:     DME Arranged:    DME Agency:     HH Arranged:  RN, PT, OT, Nurse's Aide [place] Agency:  Other - See comment  Status of Service:  Completed, signed off  If discussed at [place] of Stay Meetings, dates discussed:    Additional Comments:  Zenon Mayo, RN 11/10/2016, 2:14 PM

## 2016-11-10 NOTE — Discharge Summary (Signed)
Physician Discharge Summary  [name] BPZ:[id number] DOB: [birth year] DOA: 11/08/2016  PCP: Reynold Bowen, MD  Admit date: 11/08/2016 Discharge date: 11/10/2016  Admitted From:ALF Disposition:ALF  Recommendations for Outpatient Follow-up:  1. Follow up with PCP in 1-2 weeks 2. Please obtain BMP/CBC in one week 3. Follow up final culture results with PCP.  Home Health:yes Equipment/Devices:none Discharge Condition:stable CODE STATUS:DNR Diet recommendation:heart healthy  Brief/Interim Summary: 83 y.o.femalewith a history of COPD not on oxygen at home, diabetes, hypertension, history of breast cancer, prior history of pneumonia, last event in June of this year, brought by EMS from assisted living facility, after being found very short of breath, coughing, respiratory distress.  # Sepsis due to right lower lobe pneumonia and urinary tract infection: -Patient with fever, leukocytosis, lactic acid level of 6 on admission.Patient with elevated pro-calcitonin level. -MRSA PCR negative.  -Treated with IV antibiotics with significant clinical improvement. Leukocytosis improved. Lactate level improved. Patient clinically feels better. Urine culture growing Klebsiella. Plan to discharge with oral antibiotics. Recommended to follow up with PCP and repeat labs.  #Right lower lobe pneumonia: No hypoxia. Plan to continue Keflex and azithromycin. Clinically improved. Patient denied chest pain, shortness of breath or cough. Recommended to repeat x-ray in 3-4 weeks after antibiotic treatment.  #Likely acute cystitis without hematuria: Urine culture growing Klebsiella. Plan to discharge with oral Keflex  #Likely acute COPD exacerbation: Treated with steroid, duoneb. On antibiotics for pneumonia. -Clinically improved.  #Hypertension: Monitor blood pressure. Resume home medication   # Anxiety depression:: Continue Klonopin, Lexapro and Wellbutrin  #Chronic back pain: Resume home dose of  Norco as needed.on colace  #Thrombocytopenia likely in the setting of sepsis. Platelet count improved to 126. No sign of bleeding. Recommended to repeat lab in a week.  Right leg calf full-thickness wounds after dermatology procedure. Seen by wound care. Recommended a follow-up outpatient.  PT OT recommended a skilled nursing facility however patient declined. She wanted to go back to her prior assisted living facility. She was also seen by Education officer, museum. At this time patient is discharging to assisted living facility. Home care services ordered.  Discharge Diagnoses:  Active Problems:   Dysmetabolic syndrome X   INSOMNIA, PERSISTENT   Depression   Essential hypertension   Asthma, chronic, unspecified asthma severity, with acute exacerbation   Osteoarthritis   STENOSIS, LUMBAR SPINE   COLONIC POLYPS, HX OF   Breast cancer, right breast (HCC)   SIRS (systemic inflammatory response syndrome) (HCC)   Chronic ulcer of right leg (HCC)   Wheelchair bound   Wound healing, delayed   Acute respiratory distress   Sepsis (HCC)   COPD exacerbation ([place])   Acute lower UTI    Discharge Instructions  Discharge Instructions    Call MD for:  difficulty breathing, headache or visual disturbances    Complete by:  As directed    Call MD for:  extreme fatigue    Complete by:  As directed    Call MD for:  hives    Complete by:  As directed    Call MD for:  persistant dizziness or light-headedness    Complete by:  As directed    Call MD for:  persistant nausea and vomiting    Complete by:  As directed    Call MD for:  severe uncontrolled pain    Complete by:  As directed    Call MD for:  temperature >100.4    Complete by:  As directed    Diet -  low sodium heart healthy    Complete by:  As directed    Increase activity slowly    Complete by:  As directed      Allergies as of 11/10/2016      Reactions   Oxycodone Hcl [oxycodone Hcl] Rash   Morphine Sulfate Other (See Comments)    REACTION: very anxious   Sulfamethoxazole Other (See Comments)   REACTION: unspecified      Medication List    TAKE these medications   acetaminophen 650 MG CR tablet Commonly known as:  TYLENOL Take 650-1,300 mg by mouth every 12 (twelve) hours as needed for pain. What changed:  Another medication with the same name was removed. Continue taking this medication, and follow the directions you see here.   acetaminophen 325 MG tablet Commonly known as:  TYLENOL Take 2 tablets (650 mg total) by mouth every 6 (six) hours as needed for mild pain (or Fever >/= 101). What changed:  Another medication with the same name was removed. Continue taking this medication, and follow the directions you see here.   ADVIL 200 MG tablet Generic drug:  ibuprofen Take 200 mg by mouth every 12 (twelve) hours as needed (pain).   aspirin EC 325 MG tablet Take 325 mg by mouth daily.   azithromycin 500 MG tablet Commonly known as:  ZITHROMAX Take 1 tablet (500 mg total) by mouth daily. Take 1 tablet daily for 3 days.   benzonatate 200 MG capsule Commonly known as:  TESSALON Take 200 mg by mouth 3 (three) times daily as needed for cough.   buPROPion 300 MG 24 hr tablet Commonly known as:  WELLBUTRIN XL Take 300 mg by mouth every morning.   CALCIUM+D3 600-800 MG-UNIT Tabs Generic drug:  Calcium Carb-Cholecalciferol Take 1 tablet by mouth daily.   CEPACOL SORE THROAT 15-2.6 MG Lozg Generic drug:  Benzocaine-Menthol Use as directed 1 lozenge in the mouth or throat every 4 (four) hours as needed (for sore throat).   cephALEXin 500 MG capsule Commonly known as:  KEFLEX Take 1 capsule (500 mg total) by mouth 2 (two) times daily.   ciclopirox 8 % solution Commonly known as:  PENLAC Apply 1 application topically every Tuesday. Apply over nail and surrounding skin. Apply daily over previous coat. After seven (7) days, may remove with alcohol and continue cycle.   clonazePAM 1 MG tablet Commonly  known as:  KLONOPIN Take 1 tablet (1 mg total) by mouth at bedtime as needed (insomnia). What changed:  when to take this   docusate sodium 100 MG capsule Commonly known as:  COLACE Take 100 mg by mouth daily.   DULERA 200-5 MCG/ACT Aero Generic drug:  mometasone-formoterol Inhale 1 puff into the lungs 2 (two) times daily. What changed:  Another medication with the same name was removed. Continue taking this medication, and follow the directions you see here.   escitalopram 10 MG tablet Commonly known as:  LEXAPRO Take 10 mg by mouth daily.   fluticasone 50 MCG/ACT nasal spray Commonly known as:  FLONASE Place 1 spray into both nostrils 2 (two) times daily.   gabapentin 300 MG capsule Commonly known as:  NEURONTIN Take 1 capsule (300 mg total) by mouth 3 (three) times daily.   guaiFENesin 600 MG 12 hr tablet Commonly known as:  MUCINEX Take 600 mg by mouth 2 (two) times daily as needed for cough.   hydrochlorothiazide 12.5 MG capsule Commonly known as:  MICROZIDE Take 12.5 mg by mouth daily.  HYDROcodone-acetaminophen 5-325 MG tablet Commonly known as:  NORCO/VICODIN Take 1 tablet by mouth 2 (two) times daily as needed for moderate pain.   lactobacillus Pack Take 1 packet (1 g total) by mouth 3 (three) times daily with meals.   methocarbamol 500 MG tablet Commonly known as:  ROBAXIN Take 1 tablet (500 mg total) by mouth every 6 (six) hours as needed for muscle spasms.   metoprolol tartrate 25 MG tablet Commonly known as:  LOPRESSOR Take 0.5 tablets (12.5 mg total) by mouth 2 (two) times daily.   montelukast 10 MG tablet Commonly known as:  SINGULAIR Take 10 mg by mouth at bedtime.   MYRBETRIQ 50 MG Tb24 tablet Generic drug:  mirabegron ER Take 50 mg by mouth daily.   ondansetron 4 MG tablet Commonly known as:  ZOFRAN Take 4 mg by mouth every 8 (eight) hours as needed for nausea or vomiting.   polyethylene glycol packet Commonly known as:  MIRALAX /  GLYCOLAX Take 17 g by mouth daily as needed for mild constipation.   PROAIR HFA 108 (90 Base) MCG/ACT inhaler Generic drug:  albuterol Inhale 2 puffs into the lungs every 4 (four) hours as needed for shortness of breath.   Propylene Glycol-Glycerin 0.6-0.6 % Soln Commonly known as:  SOOTHE Apply 1 drop to eye 2 (two) times daily.   sodium phosphate 7-19 GM/118ML Enem Place 1 enema rectally daily as needed for severe constipation.   Vitamin D 2000 units tablet Take 2,000 Units by mouth daily.            Discharge Care Instructions        Start     Ordered   11/10/16 0000  lactobacillus (FLORANEX/LACTINEX) PACK  3 times daily with meals     11/10/16 1052   11/10/16 0000  cephALEXin (KEFLEX) 500 MG capsule  2 times daily     11/10/16 1052   11/10/16 0000  azithromycin (ZITHROMAX) 500 MG tablet  Daily     11/10/16 1052   11/10/16 0000  Increase activity slowly     11/10/16 1052   11/10/16 0000  Diet - low sodium heart healthy     11/10/16 1052   11/10/16 0000  Call MD for:  temperature >100.4     11/10/16 1052   11/10/16 0000  Call MD for:  persistant nausea and vomiting     11/10/16 1052   11/10/16 0000  Call MD for:  severe uncontrolled pain     11/10/16 1052   11/10/16 0000  Call MD for:  difficulty breathing, headache or visual disturbances     11/10/16 1052   11/10/16 0000  Call MD for:  hives     11/10/16 1052   11/10/16 0000  Call MD for:  persistant dizziness or light-headedness     11/10/16 1052   11/10/16 0000  Call MD for:  extreme fatigue     11/10/16 1052      Contact information for follow-up providers    Reynold Bowen, MD. Schedule an appointment as soon as possible for a visit in 1 week(s).   Specialty:  Endocrinology Contact information: [place] [address] [phone]            Contact information for after-discharge care    [facility] Follow up.   Specialty:   Assisted Living Facility Contact information: [phone] [address] [place] [phone]  Allergies  Allergen Reactions  . Oxycodone Hcl [Oxycodone Hcl] Rash  . Morphine Sulfate Other (See Comments)    REACTION: very anxious  . Sulfamethoxazole Other (See Comments)    REACTION: unspecified    Consultations: None  Procedures/Studies: None  Subjective: Seen and examined at bedside. Reported feeling better. Denied headache, dizziness, nausea, vomiting, chest pain, shortness of breath, abdominal pain. No dysuria or urgency or frequency. No hypoxia. Does not want to go to a skilled nursing facility.  Discharge Exam: Vitals:   11/10/16 0737 11/10/16 0801  BP:  (!) 154/79  Pulse: (!) 43 85  Resp: 19 17  Temp:  98.7 F (37.1 C)  SpO2: 93% 95%   Vitals:   11/10/16 0330 11/10/16 0500 11/10/16 0737 11/10/16 0801  BP: (!) 136/57   (!) 154/79  Pulse: 85  (!) 43 85  Resp: 16  19 17   Temp: 98.4 F (36.9 C)   98.7 F (37.1 C)  TempSrc: Oral   Oral  SpO2: 97%  93% 95%  Weight:  98.7 kg (217 lb 9.5 oz)    Height:        General: Pt is alert, awake, not in acute distress Cardiovascular: RRR, S1/S2 +, no rubs, no gallops Respiratory: CTA bilaterally, no wheezing, no rhonchi Abdominal: Soft, NT, ND, bowel sounds + Extremities: no edema, no cyanosis    The results of significant diagnostics from this hospitalization (including imaging, microbiology, ancillary and laboratory) are listed below for reference.     Microbiology: Recent Results (from the past 240 hour(s))  Urine culture     Status: Abnormal   Collection Time: 11/08/16  9:22 AM  Result Value Ref Range Status   Specimen Description URINE, RANDOM  Final   Special Requests NONE  Final   Culture >=100,000 COLONIES/mL KLEBSIELLA PNEUMONIAE (A)  Final   Report Status 11/10/2016 FINAL  Final   Organism ID, Bacteria KLEBSIELLA PNEUMONIAE (A)  Final      Susceptibility    Klebsiella pneumoniae - MIC*    AMPICILLIN >=32 RESISTANT Resistant     CEFAZOLIN <=4 SENSITIVE Sensitive     CEFTRIAXONE <=1 SENSITIVE Sensitive     CIPROFLOXACIN <=0.25 SENSITIVE Sensitive     GENTAMICIN <=1 SENSITIVE Sensitive     IMIPENEM <=0.25 SENSITIVE Sensitive     NITROFURANTOIN 32 SENSITIVE Sensitive     TRIMETH/SULFA <=20 SENSITIVE Sensitive     AMPICILLIN/SULBACTAM 4 SENSITIVE Sensitive     PIP/TAZO <=4 SENSITIVE Sensitive     Extended ESBL NEGATIVE Sensitive     * >=100,000 COLONIES/mL KLEBSIELLA PNEUMONIAE  Blood Culture (routine x 2)     Status: None (Preliminary result)   Collection Time: 11/08/16  9:40 AM  Result Value Ref Range Status   Specimen Description BLOOD RIGHT ANTECUBITAL  Final   Special Requests   Final    BOTTLES DRAWN AEROBIC AND ANAEROBIC Blood Culture adequate volume   Culture NO GROWTH 1 DAY  Final   Report Status PENDING  Incomplete  Blood Culture (routine x 2)     Status: None (Preliminary result)   Collection Time: 11/08/16  9:49 AM  Result Value Ref Range Status   Specimen Description BLOOD LEFT HAND  Final   Special Requests IN PEDIATRIC BOTTLE Blood Culture adequate volume  Final   Culture NO GROWTH 1 DAY  Final   Report Status PENDING  Incomplete  MRSA PCR Screening     Status: None   Collection Time: 11/08/16  2:27 PM  Result Value  Ref Range Status   MRSA by PCR NEGATIVE NEGATIVE Final    Comment:        The GeneXpert MRSA Assay (FDA approved for NASAL specimens only), is one component of a comprehensive MRSA colonization surveillance program. It is not intended to diagnose MRSA infection nor to guide or monitor treatment for MRSA infections.      Labs: BNP (last 3 results)  Recent Labs  07/07/16 0844 11/08/16 0855  BNP 103.1* 32.6   Basic Metabolic Panel:  Recent Labs Lab 11/08/16 0855 11/09/16 0156 11/10/16 0623  NA 138 137 139  K 5.2* 3.3* 4.4  CL 102 109 109  CO2 27 25 25   GLUCOSE 107* 201* 144*  BUN 13 7  12   CREATININE 0.79 0.50 0.53  CALCIUM 9.2 8.5* 9.1   Liver Function Tests:  Recent Labs Lab 11/08/16 0855 11/09/16 0156  AST 35 18  ALT 24 16  ALKPHOS 91 53  BILITOT 1.2 0.6  PROT 6.8 5.3*  ALBUMIN 4.0 2.8*   No results for input(s): LIPASE, AMYLASE in the last 168 hours. No results for input(s): AMMONIA in the last 168 hours. CBC:  Recent Labs Lab 11/08/16 0855 11/09/16 0156 11/10/16 0623  WBC 10.5 15.2* 11.3*  NEUTROABS 9.1*  --   --   HGB 14.6 12.0 12.4  HCT 45.7 37.4 37.9  MCV 96.8 96.4 95.7  PLT 169 119* 126*   Cardiac Enzymes:  Recent Labs Lab 11/08/16 0855  TROPONINI <0.03   BNP: Invalid input(s): POCBNP CBG: No results for input(s): GLUCAP in the last 168 hours. D-Dimer No results for input(s): DDIMER in the last 72 hours. Hgb A1c No results for input(s): HGBA1C in the last 72 hours. Lipid Profile No results for input(s): CHOL, HDL, LDLCALC, TRIG, CHOLHDL, LDLDIRECT in the last 72 hours. Thyroid function studies No results for input(s): TSH, T4TOTAL, T3FREE, THYROIDAB in the last 72 hours.  Invalid input(s): FREET3 Anemia work up No results for input(s): VITAMINB12, FOLATE, FERRITIN, TIBC, IRON, RETICCTPCT in the last 72 hours. Urinalysis    Component Value Date/Time   COLORURINE YELLOW 11/08/2016 0922   APPEARANCEUR CLEAR 11/08/2016 0922   LABSPEC 1.010 11/08/2016 0922   PHURINE 6.0 11/08/2016 0922   GLUCOSEU NEGATIVE 11/08/2016 0922   HGBUR SMALL (A) 11/08/2016 0922   BILIRUBINUR NEGATIVE 11/08/2016 0922   BILIRUBINUR n 09/25/2010 1525   KETONESUR NEGATIVE 11/08/2016 0922   PROTEINUR 30 (A) 11/08/2016 0922   UROBILINOGEN 0.2 09/25/2010 1525   UROBILINOGEN 0.2 01/04/2007 0900   NITRITE POSITIVE (A) 11/08/2016 0922   LEUKOCYTESUR NEGATIVE 11/08/2016 0922   Sepsis Labs Invalid input(s): PROCALCITONIN,  WBC,  LACTICIDVEN Microbiology Recent Results (from the past 240 hour(s))  Urine culture     Status: Abnormal   Collection Time:  11/08/16  9:22 AM  Result Value Ref Range Status   Specimen Description URINE, RANDOM  Final   Special Requests NONE  Final   Culture >=100,000 COLONIES/mL KLEBSIELLA PNEUMONIAE (A)  Final   Report Status 11/10/2016 FINAL  Final   Organism ID, Bacteria KLEBSIELLA PNEUMONIAE (A)  Final      Susceptibility   Klebsiella pneumoniae - MIC*    AMPICILLIN >=32 RESISTANT Resistant     CEFAZOLIN <=4 SENSITIVE Sensitive     CEFTRIAXONE <=1 SENSITIVE Sensitive     CIPROFLOXACIN <=0.25 SENSITIVE Sensitive     GENTAMICIN <=1 SENSITIVE Sensitive     IMIPENEM <=0.25 SENSITIVE Sensitive     NITROFURANTOIN 32 SENSITIVE Sensitive  TRIMETH/SULFA <=20 SENSITIVE Sensitive     AMPICILLIN/SULBACTAM 4 SENSITIVE Sensitive     PIP/TAZO <=4 SENSITIVE Sensitive     Extended ESBL NEGATIVE Sensitive     * >=100,000 COLONIES/mL KLEBSIELLA PNEUMONIAE  Blood Culture (routine x 2)     Status: None (Preliminary result)   Collection Time: 11/08/16  9:40 AM  Result Value Ref Range Status   Specimen Description BLOOD RIGHT ANTECUBITAL  Final   Special Requests   Final    BOTTLES DRAWN AEROBIC AND ANAEROBIC Blood Culture adequate volume   Culture NO GROWTH 1 DAY  Final   Report Status PENDING  Incomplete  Blood Culture (routine x 2)     Status: None (Preliminary result)   Collection Time: 11/08/16  9:49 AM  Result Value Ref Range Status   Specimen Description BLOOD LEFT HAND  Final   Special Requests IN PEDIATRIC BOTTLE Blood Culture adequate volume  Final   Culture NO GROWTH 1 DAY  Final   Report Status PENDING  Incomplete  MRSA PCR Screening     Status: None   Collection Time: 11/08/16  2:27 PM  Result Value Ref Range Status   MRSA by PCR NEGATIVE NEGATIVE Final    Comment:        The GeneXpert MRSA Assay (FDA approved for NASAL specimens only), is one component of a comprehensive MRSA colonization surveillance program. It is not intended to diagnose MRSA infection nor to guide or monitor treatment  for MRSA infections.      Time coordinating discharge: 32 minutes  SIGNED:   Rosita Fire, MD  [facility] 11/10/2016, 10:56 AM  If 7PM-7AM, please contact night-coverage [email or web address] Password [email or web address]

## 2016-11-10 NOTE — Progress Notes (Signed)
Patient will discharge to [facility] Anticipated discharge date: 9/25 Family notified: [facility] by Corey Harold- scheduled for 3:30pm  CSW signing off.  Jorge Ny, LCSW Clinical Social Worker [phone]

## 2016-11-10 NOTE — Progress Notes (Signed)
Report called to [place] at [facility].  Instructed Helene Kelp that patietn had been given Flu shot.

## 2016-11-13 LAB — CULTURE, BLOOD (ROUTINE X 2)
CULTURE: NO GROWTH
CULTURE: NO GROWTH
SPECIAL REQUESTS: ADEQUATE
SPECIAL REQUESTS: ADEQUATE

## 2016-12-14 ENCOUNTER — Other Ambulatory Visit: Payer: Self-pay | Admitting: Oncology

## 2016-12-14 DIAGNOSIS — C50111 Malignant neoplasm of central portion of right female breast: Secondary | ICD-10-CM

## 2016-12-29 ENCOUNTER — Ambulatory Visit: Payer: Medicare Other | Admitting: Gastroenterology

## 2017-02-16 DIAGNOSIS — W19XXXA Unspecified fall, initial encounter: Secondary | ICD-10-CM

## 2017-02-16 HISTORY — DX: Unspecified fall, initial encounter: W19.XXXA

## 2017-03-14 ENCOUNTER — Encounter (HOSPITAL_COMMUNITY): Payer: Self-pay | Admitting: Emergency Medicine

## 2017-03-14 ENCOUNTER — Emergency Department (HOSPITAL_COMMUNITY): Payer: Medicare Other

## 2017-03-14 ENCOUNTER — Inpatient Hospital Stay (HOSPITAL_COMMUNITY)
Admission: EM | Admit: 2017-03-14 | Discharge: 2017-03-16 | DRG: 190 | Disposition: A | Discharge status: Skilled Nursing Facility | Payer: Medicare Other | Attending: Internal Medicine | Admitting: Internal Medicine

## 2017-03-14 DIAGNOSIS — E872 Acidosis: Secondary | ICD-10-CM | POA: Diagnosis present

## 2017-03-14 DIAGNOSIS — Z66 Do not resuscitate: Secondary | ICD-10-CM | POA: Diagnosis not present

## 2017-03-14 DIAGNOSIS — R0602 Shortness of breath: Secondary | ICD-10-CM

## 2017-03-14 DIAGNOSIS — J9621 Acute and chronic respiratory failure with hypoxia: Secondary | ICD-10-CM | POA: Diagnosis present

## 2017-03-14 DIAGNOSIS — Z8249 Family history of ischemic heart disease and other diseases of the circulatory system: Secondary | ICD-10-CM | POA: Diagnosis not present

## 2017-03-14 DIAGNOSIS — Z853 Personal history of malignant neoplasm of breast: Secondary | ICD-10-CM

## 2017-03-14 DIAGNOSIS — I10 Essential (primary) hypertension: Secondary | ICD-10-CM | POA: Diagnosis not present

## 2017-03-14 DIAGNOSIS — Z96653 Presence of artificial knee joint, bilateral: Secondary | ICD-10-CM | POA: Diagnosis present

## 2017-03-14 DIAGNOSIS — Z90710 Acquired absence of both cervix and uterus: Secondary | ICD-10-CM | POA: Diagnosis not present

## 2017-03-14 DIAGNOSIS — Z7951 Long term (current) use of inhaled steroids: Secondary | ICD-10-CM | POA: Diagnosis not present

## 2017-03-14 DIAGNOSIS — W19XXXA Unspecified fall, initial encounter: Secondary | ICD-10-CM

## 2017-03-14 DIAGNOSIS — Z7982 Long term (current) use of aspirin: Secondary | ICD-10-CM

## 2017-03-14 DIAGNOSIS — Y92009 Unspecified place in unspecified non-institutional (private) residence as the place of occurrence of the external cause: Secondary | ICD-10-CM | POA: Diagnosis not present

## 2017-03-14 DIAGNOSIS — Z833 Family history of diabetes mellitus: Secondary | ICD-10-CM | POA: Diagnosis not present

## 2017-03-14 DIAGNOSIS — M4800 Spinal stenosis, site unspecified: Secondary | ICD-10-CM | POA: Diagnosis present

## 2017-03-14 DIAGNOSIS — J9622 Acute and chronic respiratory failure with hypercapnia: Secondary | ICD-10-CM | POA: Diagnosis present

## 2017-03-14 DIAGNOSIS — Z794 Long term (current) use of insulin: Secondary | ICD-10-CM

## 2017-03-14 DIAGNOSIS — Z87891 Personal history of nicotine dependence: Secondary | ICD-10-CM

## 2017-03-14 DIAGNOSIS — E119 Type 2 diabetes mellitus without complications: Secondary | ICD-10-CM | POA: Diagnosis not present

## 2017-03-14 DIAGNOSIS — F418 Other specified anxiety disorders: Secondary | ICD-10-CM | POA: Diagnosis not present

## 2017-03-14 DIAGNOSIS — W1830XA Fall on same level, unspecified, initial encounter: Secondary | ICD-10-CM | POA: Diagnosis present

## 2017-03-14 DIAGNOSIS — S0181XA Laceration without foreign body of other part of head, initial encounter: Secondary | ICD-10-CM

## 2017-03-14 DIAGNOSIS — S0003XA Contusion of scalp, initial encounter: Secondary | ICD-10-CM | POA: Diagnosis not present

## 2017-03-14 DIAGNOSIS — Z79899 Other long term (current) drug therapy: Secondary | ICD-10-CM | POA: Diagnosis not present

## 2017-03-14 DIAGNOSIS — J45901 Unspecified asthma with (acute) exacerbation: Secondary | ICD-10-CM | POA: Diagnosis present

## 2017-03-14 DIAGNOSIS — R0603 Acute respiratory distress: Secondary | ICD-10-CM

## 2017-03-14 DIAGNOSIS — J441 Chronic obstructive pulmonary disease with (acute) exacerbation: Secondary | ICD-10-CM | POA: Diagnosis not present

## 2017-03-14 DIAGNOSIS — Z96641 Presence of right artificial hip joint: Secondary | ICD-10-CM | POA: Diagnosis present

## 2017-03-14 LAB — I-STAT ARTERIAL BLOOD GAS, ED
Acid-Base Excess: 5 mmol/L — ABNORMAL HIGH (ref 0.0–2.0)
Bicarbonate: 34.1 mmol/L — ABNORMAL HIGH (ref 20.0–28.0)
O2 Saturation: 100 %
PCO2 ART: 66.2 mmHg — AB (ref 32.0–48.0)
PH ART: 7.316 — AB (ref 7.350–7.450)
PO2 ART: 237 mmHg — AB (ref 83.0–108.0)
Patient temperature: 96.9
TCO2: 36 mmol/L — ABNORMAL HIGH (ref 22–32)

## 2017-03-14 MED ORDER — ALBUTEROL (5 MG/ML) CONTINUOUS INHALATION SOLN
10.0000 mg | INHALATION_SOLUTION | RESPIRATORY_TRACT | Status: AC
  Administered 2017-03-14: 10 mg via RESPIRATORY_TRACT
  Filled 2017-03-14: qty 20

## 2017-03-14 MED ORDER — ALBUTEROL SULFATE (2.5 MG/3ML) 0.083% IN NEBU: 5.0000 mg | INHALATION_SOLUTION | Freq: Once | RESPIRATORY_TRACT | Status: DC

## 2017-03-14 MED ORDER — MAGNESIUM SULFATE 2 GM/50ML IV SOLN
2.0000 g | Freq: Once | INTRAVENOUS | Status: AC
  Administered 2017-03-15: 2 g via INTRAVENOUS
  Filled 2017-03-14: qty 50

## 2017-03-14 NOTE — ED Triage Notes (Signed)
Pt fell in the bathroom tonight, no loc, denies n/v/dizziness, bleeding to head - controlled.  Ronci and wheezing in all lobes, given 15 albuteral, .5atrovent, 125 sol-medual.

## 2017-03-14 NOTE — Progress Notes (Signed)
Pt. Refused to wear the bipap but agreed to take her breathing tx. MD aware.

## 2017-03-15 DIAGNOSIS — Z7951 Long term (current) use of inhaled steroids: Secondary | ICD-10-CM | POA: Diagnosis not present

## 2017-03-15 DIAGNOSIS — J45901 Unspecified asthma with (acute) exacerbation: Secondary | ICD-10-CM | POA: Diagnosis present

## 2017-03-15 DIAGNOSIS — J9622 Acute and chronic respiratory failure with hypercapnia: Secondary | ICD-10-CM | POA: Diagnosis not present

## 2017-03-15 DIAGNOSIS — W1830XA Fall on same level, unspecified, initial encounter: Secondary | ICD-10-CM | POA: Diagnosis not present

## 2017-03-15 DIAGNOSIS — E872 Acidosis: Secondary | ICD-10-CM | POA: Diagnosis not present

## 2017-03-15 DIAGNOSIS — Z833 Family history of diabetes mellitus: Secondary | ICD-10-CM | POA: Diagnosis not present

## 2017-03-15 DIAGNOSIS — Z90710 Acquired absence of both cervix and uterus: Secondary | ICD-10-CM | POA: Diagnosis not present

## 2017-03-15 DIAGNOSIS — J9621 Acute and chronic respiratory failure with hypoxia: Secondary | ICD-10-CM | POA: Diagnosis not present

## 2017-03-15 DIAGNOSIS — Y92009 Unspecified place in unspecified non-institutional (private) residence as the place of occurrence of the external cause: Secondary | ICD-10-CM | POA: Diagnosis not present

## 2017-03-15 DIAGNOSIS — Z8249 Family history of ischemic heart disease and other diseases of the circulatory system: Secondary | ICD-10-CM | POA: Diagnosis not present

## 2017-03-15 DIAGNOSIS — Z96653 Presence of artificial knee joint, bilateral: Secondary | ICD-10-CM | POA: Diagnosis not present

## 2017-03-15 DIAGNOSIS — W19XXXA Unspecified fall, initial encounter: Secondary | ICD-10-CM | POA: Diagnosis not present

## 2017-03-15 DIAGNOSIS — R0602 Shortness of breath: Secondary | ICD-10-CM | POA: Diagnosis present

## 2017-03-15 DIAGNOSIS — F418 Other specified anxiety disorders: Secondary | ICD-10-CM | POA: Diagnosis present

## 2017-03-15 DIAGNOSIS — S0181XA Laceration without foreign body of other part of head, initial encounter: Secondary | ICD-10-CM | POA: Diagnosis not present

## 2017-03-15 DIAGNOSIS — M4800 Spinal stenosis, site unspecified: Secondary | ICD-10-CM

## 2017-03-15 DIAGNOSIS — J441 Chronic obstructive pulmonary disease with (acute) exacerbation: Secondary | ICD-10-CM | POA: Diagnosis present

## 2017-03-15 DIAGNOSIS — Z79899 Other long term (current) drug therapy: Secondary | ICD-10-CM | POA: Diagnosis not present

## 2017-03-15 DIAGNOSIS — Z7982 Long term (current) use of aspirin: Secondary | ICD-10-CM | POA: Diagnosis not present

## 2017-03-15 DIAGNOSIS — Z87891 Personal history of nicotine dependence: Secondary | ICD-10-CM | POA: Diagnosis not present

## 2017-03-15 DIAGNOSIS — E119 Type 2 diabetes mellitus without complications: Secondary | ICD-10-CM | POA: Diagnosis not present

## 2017-03-15 DIAGNOSIS — I10 Essential (primary) hypertension: Secondary | ICD-10-CM | POA: Diagnosis not present

## 2017-03-15 DIAGNOSIS — Z96641 Presence of right artificial hip joint: Secondary | ICD-10-CM | POA: Diagnosis not present

## 2017-03-15 DIAGNOSIS — Z794 Long term (current) use of insulin: Secondary | ICD-10-CM | POA: Diagnosis not present

## 2017-03-15 LAB — URINALYSIS, MICROSCOPIC (REFLEX)

## 2017-03-15 LAB — CBC
HCT: 42.9 % (ref 36.0–46.0)
HEMOGLOBIN: 14.3 g/dL (ref 12.0–15.0)
MCH: 32.1 pg (ref 26.0–34.0)
MCHC: 33.3 g/dL (ref 30.0–36.0)
MCV: 96.4 fL (ref 78.0–100.0)
Platelets: 198 10*3/uL (ref 150–400)
RBC: 4.45 MIL/uL (ref 3.87–5.11)
RDW: 13.9 % (ref 11.5–15.5)
WBC: 6.3 10*3/uL (ref 4.0–10.5)

## 2017-03-15 LAB — COMPREHENSIVE METABOLIC PANEL
ALK PHOS: 115 U/L (ref 38–126)
ALT: 15 U/L (ref 14–54)
ANION GAP: 14 (ref 5–15)
AST: 23 U/L (ref 15–41)
Albumin: 3.4 g/dL — ABNORMAL LOW (ref 3.5–5.0)
BUN: 15 mg/dL (ref 6–20)
CALCIUM: 9.4 mg/dL (ref 8.9–10.3)
CO2: 26 mmol/L (ref 22–32)
Chloride: 99 mmol/L — ABNORMAL LOW (ref 101–111)
Creatinine, Ser: 0.67 mg/dL (ref 0.44–1.00)
GFR calc non Af Amer: >60 mL/min (ref >60)
Glucose, Bld: 185 mg/dL — ABNORMAL HIGH (ref 65–99)
Potassium: 3.7 mmol/L (ref 3.5–5.1)
SODIUM: 139 mmol/L (ref 135–145)
TOTAL PROTEIN: 6.8 g/dL (ref 6.5–8.1)
Total Bilirubin: 0.4 mg/dL (ref 0.3–1.2)

## 2017-03-15 LAB — CBC WITH DIFFERENTIAL/PLATELET
Basophils Absolute: 0 10*3/uL (ref 0.0–0.1)
Basophils Relative: 0 %
EOS ABS: 0 10*3/uL (ref 0.0–0.7)
EOS PCT: 0 %
HCT: 46.4 % — ABNORMAL HIGH (ref 36.0–46.0)
Hemoglobin: 14.9 g/dL (ref 12.0–15.0)
LYMPHS ABS: 0.4 10*3/uL — AB (ref 0.7–4.0)
Lymphocytes Relative: 5 %
MCH: 31.8 pg (ref 26.0–34.0)
MCHC: 32.1 g/dL (ref 30.0–36.0)
MCV: 98.9 fL (ref 78.0–100.0)
MONO ABS: 0.1 10*3/uL (ref 0.1–1.0)
MONOS PCT: 1 %
NEUTROS PCT: 94 %
Neutro Abs: 8.5 10*3/uL — ABNORMAL HIGH (ref 1.7–7.7)
Platelets: 178 10*3/uL (ref 150–400)
RBC: 4.69 MIL/uL (ref 3.87–5.11)
RDW: 13.5 % (ref 11.5–15.5)
WBC: 9 10*3/uL (ref 4.0–10.5)

## 2017-03-15 LAB — URINALYSIS, ROUTINE W REFLEX MICROSCOPIC
BILIRUBIN URINE: NEGATIVE
Glucose, UA: NEGATIVE mg/dL
Hgb urine dipstick: NEGATIVE
KETONES UR: NEGATIVE mg/dL
Leukocytes, UA: NEGATIVE
Nitrite: NEGATIVE
Protein, ur: 30 mg/dL — AB
Specific Gravity, Urine: 1.028 (ref 1.005–1.030)
pH: 5 (ref 5.0–8.0)

## 2017-03-15 LAB — BLOOD GAS, ARTERIAL
Acid-Base Excess: 3.3 mmol/L — ABNORMAL HIGH (ref 0.0–2.0)
BICARBONATE: 28.5 mmol/L — AB (ref 20.0–28.0)
Delivery systems: POSITIVE
Drawn by: 313941
EXPIRATORY PAP: 6
FIO2: 30
Inspiratory PAP: 14
O2 SAT: 97.2 %
PATIENT TEMPERATURE: 96.8
PCO2 ART: 50.2 mmHg — AB (ref 32.0–48.0)
PH ART: 7.366 (ref 7.350–7.450)
PO2 ART: 95.1 mmHg (ref 83.0–108.0)

## 2017-03-15 LAB — RESPIRATORY PANEL BY PCR
ADENOVIRUS-RVPPCR: NOT DETECTED
Bordetella pertussis: NOT DETECTED
CHLAMYDOPHILA PNEUMONIAE-RVPPCR: NOT DETECTED
CORONAVIRUS 229E-RVPPCR: NOT DETECTED
Coronavirus HKU1: NOT DETECTED
Coronavirus NL63: NOT DETECTED
Coronavirus OC43: NOT DETECTED
INFLUENZA B-RVPPCR: NOT DETECTED
Influenza A: NOT DETECTED
MYCOPLASMA PNEUMONIAE-RVPPCR: NOT DETECTED
Metapneumovirus: NOT DETECTED
PARAINFLUENZA VIRUS 1-RVPPCR: NOT DETECTED
Parainfluenza Virus 2: NOT DETECTED
Parainfluenza Virus 3: NOT DETECTED
Parainfluenza Virus 4: NOT DETECTED
RESPIRATORY SYNCYTIAL VIRUS-RVPPCR: NOT DETECTED
Rhinovirus / Enterovirus: NOT DETECTED

## 2017-03-15 LAB — BASIC METABOLIC PANEL
ANION GAP: 15 (ref 5–15)
BUN: 15 mg/dL (ref 6–20)
CALCIUM: 9.5 mg/dL (ref 8.9–10.3)
CO2: 23 mmol/L (ref 22–32)
Chloride: 99 mmol/L — ABNORMAL LOW (ref 101–111)
Creatinine, Ser: 0.67 mg/dL (ref 0.44–1.00)
GFR calc Af Amer: >60 mL/min (ref >60)
GFR calc non Af Amer: >60 mL/min (ref >60)
GLUCOSE: 187 mg/dL — AB (ref 65–99)
Potassium: 4.3 mmol/L (ref 3.5–5.1)
Sodium: 137 mmol/L (ref 135–145)

## 2017-03-15 LAB — INFLUENZA PANEL BY PCR (TYPE A & B)
INFLAPCR: NEGATIVE
Influenza B By PCR: NEGATIVE

## 2017-03-15 LAB — I-STAT CG4 LACTIC ACID, ED: Lactic Acid, Venous: 1.83 mmol/L (ref 0.5–1.9)

## 2017-03-15 LAB — CBG MONITORING, ED: GLUCOSE-CAPILLARY: 187 mg/dL — AB (ref 65–99)

## 2017-03-15 LAB — STREP PNEUMONIAE URINARY ANTIGEN: Strep Pneumo Urinary Antigen: NEGATIVE

## 2017-03-15 LAB — MRSA PCR SCREENING: MRSA by PCR: NEGATIVE

## 2017-03-15 MED ORDER — ACETAMINOPHEN 325 MG PO TABS: 650.0000 mg | ORAL_TABLET | Freq: Four times a day (QID) | ORAL | Status: DC | PRN

## 2017-03-15 MED ORDER — MIRABEGRON ER 50 MG PO TB24
50.0000 mg | ORAL_TABLET | Freq: Every day | ORAL | Status: DC
  Administered 2017-03-15 – 2017-03-16 (×2): 50 mg via ORAL
  Filled 2017-03-15 (×3): qty 1

## 2017-03-15 MED ORDER — LIDOCAINE-EPINEPHRINE (PF) 2 %-1:200000 IJ SOLN
20.0000 mL | Freq: Once | INTRAMUSCULAR | Status: AC
  Administered 2017-03-15: 20 mL
  Filled 2017-03-15: qty 20

## 2017-03-15 MED ORDER — ESCITALOPRAM OXALATE 10 MG PO TABS
10.0000 mg | ORAL_TABLET | Freq: Every day | ORAL | Status: DC
  Administered 2017-03-15 – 2017-03-16 (×2): 10 mg via ORAL
  Filled 2017-03-15 (×2): qty 1

## 2017-03-15 MED ORDER — ALBUTEROL SULFATE (2.5 MG/3ML) 0.083% IN NEBU: 2.5000 mg | INHALATION_SOLUTION | RESPIRATORY_TRACT | Status: DC | PRN

## 2017-03-15 MED ORDER — POLYETHYLENE GLYCOL 3350 17 G PO PACK: 17.0000 g | PACK | Freq: Every day | ORAL | Status: DC | PRN

## 2017-03-15 MED ORDER — DOCUSATE SODIUM 100 MG PO CAPS
100.0000 mg | ORAL_CAPSULE | Freq: Every day | ORAL | Status: DC
  Administered 2017-03-15 – 2017-03-16 (×2): 100 mg via ORAL
  Filled 2017-03-15 (×2): qty 1

## 2017-03-15 MED ORDER — IPRATROPIUM-ALBUTEROL 0.5-2.5 (3) MG/3ML IN SOLN: 3.0000 mL | Freq: Four times a day (QID) | RESPIRATORY_TRACT | Status: DC | PRN

## 2017-03-15 MED ORDER — BUPROPION HCL ER (XL) 300 MG PO TB24
300.0000 mg | ORAL_TABLET | ORAL | Status: DC
  Administered 2017-03-16: 300 mg via ORAL
  Filled 2017-03-15: qty 1

## 2017-03-15 MED ORDER — ENOXAPARIN SODIUM 40 MG/0.4ML ~~LOC~~ SOLN
40.0000 mg | SUBCUTANEOUS | Status: DC
  Administered 2017-03-15 – 2017-03-16 (×2): 40 mg via SUBCUTANEOUS
  Filled 2017-03-15 (×2): qty 0.4

## 2017-03-15 MED ORDER — FLEET ENEMA 7-19 GM/118ML RE ENEM
1.0000 | ENEMA | Freq: Every day | RECTAL | Status: DC | PRN
  Filled 2017-03-15: qty 1

## 2017-03-15 MED ORDER — POLYVINYL ALCOHOL 1.4 % OP SOLN
1.0000 [drp] | Freq: Two times a day (BID) | OPHTHALMIC | Status: DC
  Administered 2017-03-15 – 2017-03-16 (×3): 1 [drp] via OPHTHALMIC
  Filled 2017-03-15 (×2): qty 15

## 2017-03-15 MED ORDER — CLONAZEPAM 0.5 MG PO TABS
0.5000 mg | ORAL_TABLET | Freq: Every evening | ORAL | Status: DC | PRN
  Administered 2017-03-15: 0.5 mg via ORAL
  Filled 2017-03-15: qty 1

## 2017-03-15 MED ORDER — AZITHROMYCIN 500 MG PO TABS
500.0000 mg | ORAL_TABLET | Freq: Every day | ORAL | Status: AC
  Administered 2017-03-15: 500 mg via ORAL
  Filled 2017-03-15: qty 1

## 2017-03-15 MED ORDER — MENTHOL 3 MG MT LOZG: 1.0000 | LOZENGE | OROMUCOSAL | Status: DC | PRN

## 2017-03-15 MED ORDER — SENNA 8.6 MG PO TABS: 1.0000 | ORAL_TABLET | Freq: Every day | ORAL | Status: DC | PRN

## 2017-03-15 MED ORDER — CLONAZEPAM 1 MG PO TABS: 1.0000 mg | ORAL_TABLET | Freq: Every evening | ORAL | Status: DC | PRN

## 2017-03-15 MED ORDER — ASPIRIN EC 325 MG PO TBEC
325.0000 mg | DELAYED_RELEASE_TABLET | Freq: Every day | ORAL | Status: DC
  Administered 2017-03-15 – 2017-03-16 (×2): 325 mg via ORAL
  Filled 2017-03-15 (×2): qty 1

## 2017-03-15 MED ORDER — IBUPROFEN 200 MG PO TABS: 200.0000 mg | ORAL_TABLET | Freq: Two times a day (BID) | ORAL | Status: DC | PRN

## 2017-03-15 MED ORDER — MOMETASONE FURO-FORMOTEROL FUM 200-5 MCG/ACT IN AERO
1.0000 | INHALATION_SPRAY | Freq: Two times a day (BID) | RESPIRATORY_TRACT | Status: DC
  Administered 2017-03-15 – 2017-03-16 (×2): 1 via RESPIRATORY_TRACT
  Filled 2017-03-15: qty 8.8

## 2017-03-15 MED ORDER — AZITHROMYCIN 500 MG PO TABS
250.0000 mg | ORAL_TABLET | Freq: Every day | ORAL | Status: DC
  Administered 2017-03-16: 250 mg via ORAL
  Filled 2017-03-15: qty 0.5

## 2017-03-15 MED ORDER — HYDROCHLOROTHIAZIDE 12.5 MG PO CAPS: 12.5000 mg | ORAL_CAPSULE | Freq: Every day | ORAL | Status: DC

## 2017-03-15 MED ORDER — METHYLPREDNISOLONE SODIUM SUCC 40 MG IJ SOLR
40.0000 mg | Freq: Two times a day (BID) | INTRAMUSCULAR | Status: DC
  Administered 2017-03-15 – 2017-03-16 (×2): 40 mg via INTRAVENOUS
  Filled 2017-03-15 (×2): qty 1

## 2017-03-15 MED ORDER — MONTELUKAST SODIUM 10 MG PO TABS
10.0000 mg | ORAL_TABLET | Freq: Every day | ORAL | Status: DC
  Administered 2017-03-15: 10 mg via ORAL
  Filled 2017-03-15: qty 1

## 2017-03-15 MED ORDER — METHYLPREDNISOLONE SODIUM SUCC 125 MG IJ SOLR
60.0000 mg | Freq: Three times a day (TID) | INTRAMUSCULAR | Status: DC
  Administered 2017-03-15: 60 mg via INTRAVENOUS
  Filled 2017-03-15: qty 2

## 2017-03-15 MED ORDER — FLUTICASONE PROPIONATE 50 MCG/ACT NA SUSP
1.0000 | Freq: Two times a day (BID) | NASAL | Status: DC
  Administered 2017-03-15 – 2017-03-16 (×3): 1 via NASAL
  Filled 2017-03-15 (×2): qty 16

## 2017-03-15 MED ORDER — MUPIROCIN 2 % EX OINT
TOPICAL_OINTMENT | Freq: Every day | CUTANEOUS | Status: DC
  Administered 2017-03-15 – 2017-03-16 (×2): via TOPICAL
  Filled 2017-03-15: qty 22

## 2017-03-15 MED ORDER — HYDROCODONE-ACETAMINOPHEN 5-325 MG PO TABS
1.0000 | ORAL_TABLET | Freq: Two times a day (BID) | ORAL | Status: DC | PRN
  Administered 2017-03-15 – 2017-03-16 (×3): 1 via ORAL
  Filled 2017-03-15 (×3): qty 1

## 2017-03-15 MED ORDER — METOPROLOL TARTRATE 12.5 MG HALF TABLET
12.5000 mg | ORAL_TABLET | Freq: Two times a day (BID) | ORAL | Status: DC
  Administered 2017-03-15 – 2017-03-16 (×3): 12.5 mg via ORAL
  Filled 2017-03-15 (×3): qty 1

## 2017-03-15 MED ORDER — CALCIUM CARBONATE-VITAMIN D 500-200 MG-UNIT PO TABS
1.0000 | ORAL_TABLET | Freq: Every day | ORAL | Status: DC
  Administered 2017-03-15 – 2017-03-16 (×2): 1 via ORAL
  Filled 2017-03-15 (×2): qty 1

## 2017-03-15 MED ORDER — BENZONATATE 100 MG PO CAPS: 200.0000 mg | ORAL_CAPSULE | Freq: Three times a day (TID) | ORAL | Status: DC | PRN

## 2017-03-15 MED ORDER — CICLOPIROX 8 % EX SOLN: Freq: Every day | CUTANEOUS | Status: DC

## 2017-03-15 MED ORDER — MICONAZOLE NITRATE 2 % EX CREA
TOPICAL_CREAM | Freq: Two times a day (BID) | CUTANEOUS | Status: DC
  Administered 2017-03-15 – 2017-03-16 (×3): via TOPICAL
  Filled 2017-03-15 (×2): qty 14

## 2017-03-15 MED ORDER — METHOCARBAMOL 500 MG PO TABS: 500.0000 mg | ORAL_TABLET | Freq: Four times a day (QID) | ORAL | Status: DC | PRN

## 2017-03-15 MED ORDER — IPRATROPIUM-ALBUTEROL 0.5-2.5 (3) MG/3ML IN SOLN
3.0000 mL | RESPIRATORY_TRACT | Status: DC
  Administered 2017-03-15 (×3): 3 mL via RESPIRATORY_TRACT
  Filled 2017-03-15 (×3): qty 3

## 2017-03-15 MED ORDER — GABAPENTIN 300 MG PO CAPS
300.0000 mg | ORAL_CAPSULE | Freq: Three times a day (TID) | ORAL | Status: DC
  Administered 2017-03-15 – 2017-03-16 (×5): 300 mg via ORAL
  Filled 2017-03-15 (×5): qty 1

## 2017-03-15 NOTE — Progress Notes (Signed)
PT Cancellation Note  Patient Details Name: [name] MRN: [id number] DOB: [birth year]   Cancelled Treatment:    Reason Eval/Treat Not Completed: Medical issues which prohibited therapy. Pt now on bipap.  Will check on at later date.    Shary Decamp [place] 03/15/2017, 10:07 AM Suanne Marker PT [phone]

## 2017-03-15 NOTE — Significant Event (Signed)
Rapid Response Event Note  Overview: Call Time 612 Arrival Time 615  Initial Focused Assessment:   Called by [facility]. Patient will need to trial BIPAP for decreased LOC.  Patient was in the ED, s/p mechanical fall last night, ABG in ER showed 7.31/66.2/237/34, patient refused BIPAP in the ER.  When I arrived, patient was somnolent, did awaken to sternal rub, once awake, able to follow commands and nods to questions at times.  Lung sounds diminished throughout but equal air movement, no wheezing, not in respiratory distress. VSS   Interventions: -- BIPAP trial -- Repeat ABG at 730 -- Transfer to SDU  Plan of Care (if not transferred): -- Transfer to 2C15  Event Summary:   at    End Time [place], [place]

## 2017-03-15 NOTE — Clinical Social Work Note (Signed)
Clinical Social Work Assessment  Patient Details  Name: [name] MRN: [id number] Date of Birth: 21-Feb-1930  Date of referral:  03/15/17               Reason for consult:  Facility Placement, Discharge Planning                Permission sought to share information with:  Facility Sport and exercise psychologist, Family Supports Permission granted to share information::  Yes, Verbal Permission Granted  Name::     Burna Forts  Agency::  SNF's  Relationship::  Son/HCPOA  Contact Information:  [phone]  Housing/Transportation Living arrangements for the past 2 months:  [place] of Information:  Patient, Medical Team, Adult Children Patient Interpreter Needed:  None Criminal Activity/Legal Involvement Pertinent to Current Situation/Hospitalization:  No - Comment as needed Significant Relationships:  Adult Children, Spouse Lives with:  Spouse, Facility Resident Do you feel safe going back to the place where you live?  Yes Need for family participation in patient care:  Yes (Comment)  Care giving concerns:  Patient is a resident at [facility]. PT recommending SNF placement once medically stable for discharge.   Social Worker assessment / plan:  CSW met with patient. Son at bedside. CSW introduced role and explained that PT recommendations would be discussed. Patient and her son confirmed she is a resident at [facility]. She lives there with her husband. Initially patient was not agreeable to SNF placement but after further discussion about short-term rehab and then the potential for long-term care there, patient is agreeable. First preference is [facility] and patient's daughter has already contacted the facility, per patient's son. CSW sent referral and left message for admissions coordinator to review. Patient wants her husband to move there as well. No further concerns. CSW encouraged patient and her son to contact CSW as needed. CSW will continue to  follow patient and her son for support and facilitate discharge to SNF once medically stable.  Employment status:  Retired Nurse, adult PT Recommendations:  [place] / Referral to community resources:  [place]  Patient/Family's Response to care:  Patient and her son agreeable to SNF placement. Patient's husband and children supportive and involved in patient's care. Patient and her son appreciated social work intervention.  Patient/Family's Understanding of and Emotional Response to Diagnosis, Current Treatment, and Prognosis:  Patient and her son have a good understanding of the reason for admission and her need for rehab and eventual long-term care at a SNF. Patient and her son appear happy with hospital care.  Emotional Assessment Appearance:  Appears stated age Attitude/Demeanor/Rapport:  Engaged Affect (typically observed):  Accepting, Appropriate, Calm, Pleasant Orientation:  Oriented to Self, Oriented to Place, Oriented to  Time, Oriented to Situation Alcohol / Substance use:  Never Used Psych involvement (Current and /or in the community):  No (Comment)  Discharge Needs  Concerns to be addressed:  Care Coordination Readmission within the last 30 days:  No Current discharge risk:  Dependent with Mobility Barriers to Discharge:  Family Issues, Continued Medical Work up, [place], LCSW 03/15/2017, 4:28 PM

## 2017-03-15 NOTE — Progress Notes (Addendum)
[facility]   PROGRESS NOTE    DAVITA SUBLETT   YWV:[id number]  DOB: January 06, 1931  DOA: 03/14/2017 PCP: Reynold Bowen, MD   Brief Narrative:  [name] is a 83 y.o. female with medical history significant of asthma, arthritis, COPD, frequent UTIs, hypertension, spinal stenosis, diet-controlled diabetes, hypertension, depression with anxiety, prostate cancer (s/p of right lumpectomy), who presents with fall and shortness of breath. The patient declined to go on BiPAP in the ED. She was later found to be lethargic and an ABG showed metabolic acidosis with CO2 retention. She was placed on BiPAP and transferred to SDU.    Subjective: States she has had a cough with congestion for a few days. She was also having diarrhea at home without vomiting and abdominal pain.  ROS: no complaints of nausea, vomiting, constipation diarrhea, cough, dyspnea or dysuria. No other complaints.   Assessment & Plan:   Principal Problem:   COPD with acute exacerbation- Asthma exacerbation-  Acute on chronic respiratory failure with hypoxia - has improved with BiPAP- will continue to watch in SDU - Influenza negative, resp virus panel negative -  strep pneumo negative-  - cont nebs, Dulera, Singulair - wean Solumedrol  Active Problems: Diarrhea -  appears improved- cont to follow    Essential hypertension - cont Metoprolol  - hold HCTZ    Fall - PT eval    Depression with anxiety  - Lexapro   DVT prophylaxis: lovenox Code Status: DNR Family Communication:  Disposition Plan: follow in SDU Consultants:    Procedures:    Antimicrobials:  Anti-infectives (From admission, onward)   Start     Dose/Rate Route Frequency Ordered Stop   03/16/17 1000  azithromycin (ZITHROMAX) tablet 250 mg     250 mg Oral Daily 03/15/17 0229 03/20/17 0959   03/15/17 1000  azithromycin (ZITHROMAX) tablet 500 mg     500 mg Oral Daily 03/15/17 0229 03/16/17 0959       Objective: Vitals:   03/15/17 0734 03/15/17 1100 03/15/17 1203 03/15/17 1227  BP: 107/85   122/85  Pulse: 100   (!) 114  Resp: 19     Temp: 97.6 F (36.4 C)     TempSrc: Axillary     SpO2: 100% 94% 93%    No intake or output data in the 24 hours ending 03/15/17 1241 There were no vitals filed for this visit.  Examination: General exam: Appears comfortable  HEENT: PERRLA, oral mucosa moist, no sclera icterus or thrush- abrasion on forehead  Respiratory system: Clear to auscultation. Respiratory effort normal. Cardiovascular system: S1 & S2 heard, RRR.  No murmurs  Gastrointestinal system: Abdomen soft, non-tender, nondistended. Normal bowel sound. No organomegaly Central nervous system: Alert and oriented. No focal neurological deficits. Extremities: No cyanosis, clubbing or edema Skin: No rashes or ulcers Psychiatry:  Mood & affect appropriate.     Data Reviewed: I have personally reviewed following labs and imaging studies  CBC: Recent Labs  Lab 03/15/17 0210  WBC 9.0  NEUTROABS 8.5*  HGB 14.9  HCT 46.4*  MCV 98.9  PLT 854   Basic Metabolic Panel: Recent Labs  Lab 03/15/17 0210  NA 139  K 3.7  CL 99*  CO2 26  GLUCOSE 185*  BUN 15  CREATININE 0.67  CALCIUM 9.4   GFR: CrCl cannot be calculated (Unknown ideal weight.). Liver Function Tests: Recent Labs  Lab 03/15/17 0210  AST 23  ALT 15  ALKPHOS 115  BILITOT 0.4  PROT 6.8  ALBUMIN 3.4*   No results for input(s): LIPASE, AMYLASE in the last 168 hours. No results for input(s): AMMONIA in the last 168 hours. Coagulation Profile: No results for input(s): INR, PROTIME in the last 168 hours. Cardiac Enzymes: No results for input(s): CKTOTAL, CKMB, CKMBINDEX, TROPONINI in the last 168 hours. BNP (last 3 results) No results for input(s): PROBNP in the last 8760 hours. HbA1C: No results for input(s): HGBA1C in the last 72 hours. CBG: Recent Labs  Lab 03/15/17 0312  GLUCAP 187*   Lipid Profile: No results for input(s):  CHOL, HDL, LDLCALC, TRIG, CHOLHDL, LDLDIRECT in the last 72 hours. Thyroid Function Tests: No results for input(s): TSH, T4TOTAL, FREET4, T3FREE, THYROIDAB in the last 72 hours. Anemia Panel: No results for input(s): VITAMINB12, FOLATE, FERRITIN, TIBC, IRON, RETICCTPCT in the last 72 hours. Urine analysis:    Component Value Date/Time   COLORURINE YELLOW 11/08/2016 0922   APPEARANCEUR CLEAR 11/08/2016 0922   LABSPEC 1.010 11/08/2016 0922   PHURINE 6.0 11/08/2016 0922   GLUCOSEU NEGATIVE 11/08/2016 0922   HGBUR SMALL (A) 11/08/2016 0922   BILIRUBINUR NEGATIVE 11/08/2016 0922   BILIRUBINUR n 09/25/2010 1525   KETONESUR NEGATIVE 11/08/2016 0922   PROTEINUR 30 (A) 11/08/2016 0922   UROBILINOGEN 0.2 09/25/2010 1525   UROBILINOGEN 0.2 01/04/2007 0900   NITRITE POSITIVE (A) 11/08/2016 0922   LEUKOCYTESUR NEGATIVE 11/08/2016 0922   Sepsis Labs: @LABRCNTIP (procalcitonin:4,lacticidven:4) ) Recent Results (from the past 240 hour(s))  Respiratory Panel by PCR     Status: None   Collection Time: 03/15/17  2:28 AM  Result Value Ref Range Status   Adenovirus NOT DETECTED NOT DETECTED Final   Coronavirus 229E NOT DETECTED NOT DETECTED Final   Coronavirus HKU1 NOT DETECTED NOT DETECTED Final   Coronavirus NL63 NOT DETECTED NOT DETECTED Final   Coronavirus OC43 NOT DETECTED NOT DETECTED Final   Metapneumovirus NOT DETECTED NOT DETECTED Final   Rhinovirus / Enterovirus NOT DETECTED NOT DETECTED Final   Influenza A NOT DETECTED NOT DETECTED Final   Influenza B NOT DETECTED NOT DETECTED Final   Parainfluenza Virus 1 NOT DETECTED NOT DETECTED Final   Parainfluenza Virus 2 NOT DETECTED NOT DETECTED Final   Parainfluenza Virus 3 NOT DETECTED NOT DETECTED Final   Parainfluenza Virus 4 NOT DETECTED NOT DETECTED Final   Respiratory Syncytial Virus NOT DETECTED NOT DETECTED Final   Bordetella pertussis NOT DETECTED NOT DETECTED Final   Chlamydophila pneumoniae NOT DETECTED NOT DETECTED Final    Mycoplasma pneumoniae NOT DETECTED NOT DETECTED Final  Culture, blood (routine x 2) Call MD if unable to obtain prior to antibiotics being given     Status: None (Preliminary result)   Collection Time: 03/15/17  3:48 AM  Result Value Ref Range Status   Specimen Description BLOOD LEFT FOREARM  Final   Special Requests IN PEDIATRIC BOTTLE Blood Culture adequate volume  Final   Culture PENDING  Incomplete   Report Status PENDING  Incomplete         Radiology Studies: Ct Head Wo Contrast  Result Date: 03/14/2017 CLINICAL DATA:  Fall tonight. EXAM: CT HEAD WITHOUT CONTRAST CT CERVICAL SPINE WITHOUT CONTRAST TECHNIQUE: Multidetector CT imaging of the head and cervical spine was performed following the standard protocol without intravenous contrast. Multiplanar CT image reconstructions of the cervical spine were also generated. COMPARISON:  11/28/2015 FINDINGS: CT HEAD FINDINGS Brain: Mild cerebral atrophy. Ventricular dilatation likely due to central atrophy. Low-attenuation changes in the periventricular white matter consistent small vessel ischemia. No  mass effect or midline shift. No abnormal extra-axial fluid collections. Gray-white matter junctions are distinct. Basal cisterns are not effaced. No acute intracranial hemorrhage. Vascular: Intracranial arterial vascular calcifications are present. Skull: Calvarium appears intact. Sinuses/Orbits: Paranasal sinuses and mastoid air cells are not opacified. Other: Small subcutaneous scalp hematoma over the right anterior frontal region. Probable small laceration. CT CERVICAL SPINE FINDINGS Examination is technically limited due to motion artifact. Alignment: Straightening of the usual cervical lordosis is likely postoperative. No anterior subluxation. Normal alignment of the facet joints. Skull base and vertebrae: Skull base appears intact. Severe degenerative changes at C1 to with mild compression of the left lateral mass of C1, likely chronic diffuse bone  demineralization. Postoperative changes with anterior plate and screw fixation and intervertebral fusion from C4 through C7. Fusion of the posterior elements with cerclage wire. Few segments appear intact. No destructive or expansile bone lesions. Soft tissues and spinal canal: No prevertebral soft tissue swelling. No paraspinal soft tissue mass or infiltration. Disc levels: Fusion from C3 through C7 as discussed. Degenerative changes at C1 to, C2-3, and C7-T1 levels. Upper chest: Scarring in the right lung apex. Other: None. IMPRESSION: 1. No acute intracranial abnormalities. Chronic atrophy and small vessel ischemic changes. 2. Postoperative anterior fixation and intervertebral fusion from C4 through C7. Posterior fusion with cerclage wire. Fused segments appear intact. Straightening of the usual cervical lordosis is likely postoperative. Severe degenerative changes at C1 to. No acute displaced fractures identified. Electronically Signed   By: Lucienne Capers M.D.   On: 03/14/2017 23:44   Ct Cervical Spine Wo Contrast  Result Date: 03/14/2017 CLINICAL DATA:  Fall tonight. EXAM: CT HEAD WITHOUT CONTRAST CT CERVICAL SPINE WITHOUT CONTRAST TECHNIQUE: Multidetector CT imaging of the head and cervical spine was performed following the standard protocol without intravenous contrast. Multiplanar CT image reconstructions of the cervical spine were also generated. COMPARISON:  11/28/2015 FINDINGS: CT HEAD FINDINGS Brain: Mild cerebral atrophy. Ventricular dilatation likely due to central atrophy. Low-attenuation changes in the periventricular white matter consistent small vessel ischemia. No mass effect or midline shift. No abnormal extra-axial fluid collections. Gray-white matter junctions are distinct. Basal cisterns are not effaced. No acute intracranial hemorrhage. Vascular: Intracranial arterial vascular calcifications are present. Skull: Calvarium appears intact. Sinuses/Orbits: Paranasal sinuses and mastoid air  cells are not opacified. Other: Small subcutaneous scalp hematoma over the right anterior frontal region. Probable small laceration. CT CERVICAL SPINE FINDINGS Examination is technically limited due to motion artifact. Alignment: Straightening of the usual cervical lordosis is likely postoperative. No anterior subluxation. Normal alignment of the facet joints. Skull base and vertebrae: Skull base appears intact. Severe degenerative changes at C1 to with mild compression of the left lateral mass of C1, likely chronic diffuse bone demineralization. Postoperative changes with anterior plate and screw fixation and intervertebral fusion from C4 through C7. Fusion of the posterior elements with cerclage wire. Few segments appear intact. No destructive or expansile bone lesions. Soft tissues and spinal canal: No prevertebral soft tissue swelling. No paraspinal soft tissue mass or infiltration. Disc levels: Fusion from C3 through C7 as discussed. Degenerative changes at C1 to, C2-3, and C7-T1 levels. Upper chest: Scarring in the right lung apex. Other: None. IMPRESSION: 1. No acute intracranial abnormalities. Chronic atrophy and small vessel ischemic changes. 2. Postoperative anterior fixation and intervertebral fusion from C4 through C7. Posterior fusion with cerclage wire. Fused segments appear intact. Straightening of the usual cervical lordosis is likely postoperative. Severe degenerative changes at C1 to. No acute displaced  fractures identified. Electronically Signed   By: Lucienne Capers M.D.   On: 03/14/2017 23:44   Dg Chest Port 1 View  Result Date: 03/14/2017 CLINICAL DATA:  Shortness of breath, fall. EXAM: PORTABLE CHEST 1 VIEW COMPARISON:  Chest x-ray dated 11/09/2016. FINDINGS: Heart size and mediastinal contours are within normal limits. Lungs are clear. No pleural effusion or pneumothorax seen. Osseous structures about the chest are unremarkable. IMPRESSION: No active disease. Electronically Signed   By:  Franki Cabot M.D.   On: 03/14/2017 23:38      Scheduled Meds: . aspirin EC  325 mg Oral Daily  . azithromycin  500 mg Oral Daily   Followed by  . [START ON 03/16/2017] azithromycin  250 mg Oral Daily  . buPROPion  300 mg Oral BH-q7a  . calcium-vitamin D  1 tablet Oral Daily  . docusate sodium  100 mg Oral Daily  . escitalopram  10 mg Oral Daily  . fluticasone  1 spray Each Nare BID  . gabapentin  300 mg Oral TID  . ipratropium-albuterol  3 mL Nebulization Q4H  . methylPREDNISolone (SOLU-MEDROL) injection  60 mg Intravenous Q8H  . metoprolol tartrate  12.5 mg Oral BID  . miconazole   Topical BID  . mirabegron ER  50 mg Oral Daily  . mometasone-formoterol  1 puff Inhalation BID  . montelukast  10 mg Oral QHS  . mupirocin ointment   Topical Daily  . polyvinyl alcohol  1 drop Both Eyes BID   Continuous Infusions:   LOS: 0 days    Time spent in minutes: 35    Debbe Odea, MD [facility] Pager: [email or web address] Password [facility] 03/15/2017, 12:41 PM

## 2017-03-15 NOTE — H&P (Signed)
History and Physical    AFUA HOOTS MCN:[id number] DOB: February 24, 1930 DOA: 03/14/2017  Referring MD/NP/PA:   PCP: Reynold Bowen, MD   Patient coming from:  The patient is coming from assisted living facility.  At baseline, pt is partially dependent for most of ADL.     Chief Complaint: Fall and shortness of breath  HPI: [name] is a 84 y.o. female with medical history significant of asthma, arthritis, COPD, frequent UTIs, hypertension, spinal stenosis,  diet-controlled diabetes, hypertension, depression with anxiety, prostate cancer (s/p of right lumpectomy), who presents with fall and shortness of breath.  Patient states she was sitting on the toilet when she bent over to pull up her underwear and lost her balance, striking the right side of her head on the floor. She denies loss of consciousness. She has small skin laceration in the right forehead with bleeding. Patient also reports shortness of breath which has been going on for several days. She has dry cough, but denies chest pain. No tenderness in the calf areas. Patient was found to have oxygen desaturation to 80s in the emergency room. Patient denies nausea, vomiting, diarrhea, abdominal pain, symptoms of UTI. Patient does not have unilateral weakness, numbness or tingling to extremities, no facial droop or slurred speech, no vision change or hearing loss.  ED Course: pt was found to have WBC 9.0, electrolytes renal function okay, temperature normal, heart rate 90s, respiration rate 26, ABG with pH of 7.316, PCO2 60, PO2 237, negative chest x-ray. CT of head and C-spin negative for acute abnormalities. Patient is admitted to telemetry bed as inpatient.  Review of Systems:   General: no fevers, chills, no body weight gain, has poor appetite, has fatigue HEENT: no blurry vision, hearing changes or sore throat Respiratory: has dyspnea, coughing, wheezing CV: no chest pain, no palpitations GI: no nausea, vomiting, abdominal pain,  diarrhea, constipation GU: no dysuria, burning on urination, increased urinary frequency, hematuria  Ext: has mild leg edema Neuro: no unilateral weakness, numbness, or tingling, no vision change or hearing loss. Has fall. Skin: no rash. Has skin tear in R forehead. MSK: No muscle spasm, no deformity, no limitation of range of movement in spin Heme: No easy bruising.  Travel history: No recent long distant travel.  Allergy:  Allergies  Allergen Reactions  . Oxycodone Hcl [Oxycodone Hcl] Rash  . Morphine Sulfate Other (See Comments)    REACTION: very anxious  . Sulfamethoxazole Other (See Comments)    REACTION: unspecified    Past Medical History:  Diagnosis Date  . Arthritis   . Asthma   . Breast cancer ([place]) 2006   right w/lumpectomy  . COPD (chronic obstructive pulmonary disease) ([place])   . Depression   . Diabetes ([place]) 2007  . Diverticulosis of colon   . Endometrial hyperplasia 01/1996   fibroids, adenomyosis  . Frequent UTI   . History of colon polyps   . Hypertension   . Insomnia   . Osteopenia 12/08   hip  . Spinal stenosis 2001  . Vitamin D deficiency     Past Surgical History:  Procedure Laterality Date  . BILATERAL SALPINGOOPHORECTOMY  12/97  . BREAST LUMPECTOMY Right 03/2003  . CARPAL TUNNEL RELEASE    . CERVICAL FUSION    . CERVICAL LAMINECTOMY  1975   x4  . CHOLECYSTECTOMY  11/00   lap chole  . HYSTEROSCOPY  10/97   D&C, postmenopausal bleeding  . LUMBAR LAMINECTOMY  10/06   with spinal stenosis  .  manipulation of hip for dislocation    . ROTATOR CUFF REPAIR Right 11/96  . TONSILLECTOMY AND ADENOIDECTOMY    . TOTAL ABDOMINAL HYSTERECTOMY  12/97   endo hyperplasia, fibroids, adenomyosis  . TOTAL HIP ARTHROPLASTY Right 8/00  . TOTAL HIP REVISION Right 07/10/2015   Procedure: RIGHT ACETABULAR VS TOTAL HIP REVISION;  Surgeon: Gaynelle Arabian, MD;  Location: [facility];  Service: Orthopedics;  Laterality: Right;  . TOTAL KNEE ARTHROPLASTY Left 06/2003  .  TOTAL KNEE ARTHROPLASTY Right 10/06  . TUBAL LIGATION Bilateral 1968    Social History:  reports that she quit smoking about 26 years ago. Her smoking use included cigarettes. She has a 2.50 pack-year smoking history. she has never used smokeless tobacco. She reports that she drinks about 2.4 oz of alcohol per week. She reports that she does not use drugs.  Family History:  Family History  Problem Relation Age of Onset  . Diabetes Father   . Colon cancer Father 36  . Heart failure Father   . Arthritis Mother   . Thyroid disease Mother   . Osteoporosis Mother   . Diabetes Brother   . Diabetes Brother   . Osteoporosis Maternal Aunt   . Osteoporosis Maternal Grandmother      Prior to Admission medications   Medication Sig Start Date End Date Taking? Authorizing Provider  acetaminophen (TYLENOL) 325 MG tablet Take 2 tablets (650 mg total) by mouth every 6 (six) hours as needed for mild pain (or Fever >/= 101). 07/12/15  Yes Perkins, Alexzandrew L, PA-C  acetaminophen (TYLENOL) 650 MG CR tablet Take 650-1,300 mg by mouth every 12 (twelve) hours as needed for pain.   Yes [provider]  albuterol (PROAIR HFA) 108 (90 BASE) MCG/ACT inhaler Inhale 2 puffs into the lungs every 4 (four) hours as needed for shortness of breath.    Yes [provider]  aspirin EC 325 MG tablet Take 325 mg by mouth daily.    Yes [provider]  Benzocaine-Menthol (CEPACOL SORE THROAT) 15-2.6 MG LOZG Use as directed 1 lozenge in the mouth or throat every 4 (four) hours as needed (for sore throat).   Yes [provider]  benzonatate (TESSALON) 200 MG capsule Take 200 mg by mouth 3 (three) times daily as needed for cough.   Yes [provider]  buPROPion (WELLBUTRIN XL) 300 MG 24 hr tablet Take 300 mg by mouth every morning.    Yes [provider]  Calcium Carb-Cholecalciferol (CALCIUM+D3) 600-800 MG-UNIT TABS Take 1 tablet by mouth daily.   Yes [provider]  ciclopirox (PENLAC) 8 % solution Apply topically daily. Over nail and surrounding skin   Yes [provider]  clonazePAM (KLONOPIN) 1 MG tablet Take 1 tablet (1 mg total) by mouth at bedtime as needed (insomnia). Patient taking differently: Take 1 mg by mouth at bedtime as needed for anxiety.  08/12/16  Yes Florencia Reasons, MD  docusate sodium (COLACE) 100 MG capsule Take 100 mg by mouth daily.   Yes [provider]  escitalopram (LEXAPRO) 10 MG tablet Take 10 mg by mouth daily.  04/20/11  Yes [provider]  fluticasone (FLONASE) 50 MCG/ACT nasal spray Place 1 spray into both nostrils 2 (two) times daily.   Yes [provider]  gabapentin (NEURONTIN) 300 MG capsule TAKE ONE CAPSULE THREE TIMES DAILY 12/14/16  Yes Magrinat, Virgie Dad, MD  hydrochlorothiazide (MICROZIDE) 12.5 MG capsule Take 12.5 mg by mouth daily.   Yes [provider]  HYDROcodone-acetaminophen (NORCO/VICODIN) 5-325 MG tablet Take 1 tablet by mouth 2 (two) times daily as needed for moderate pain.   Yes [provider]  ibuprofen (ADVIL) 200 MG tablet Take 200 mg by mouth every 12 (twelve) hours as needed (pain).    Yes [provider]  methocarbamol (ROBAXIN) 500 MG tablet Take 1 tablet (500 mg total) by mouth every 6 (six) hours as needed for muscle spasms. 07/12/15  Yes Perkins, Alexzandrew L, PA-C  metoprolol tartrate (LOPRESSOR) 25 MG tablet Take 0.5 tablets (12.5 mg total) by mouth 2 (two) times daily. 08/12/16  Yes Florencia Reasons, MD  miconazole (MICOTIN) 2 % powder Apply topically 2 (two) times daily. To area under breasts and to bilateral groin after miconazole cream   Yes [provider]  mirabegron ER (MYRBETRIQ) 50 MG TB24 tablet Take 50 mg by mouth daily.   Yes [provider]  mometasone-formoterol (DULERA) 200-5 MCG/ACT AERO Inhale 1 puff into the lungs 2 (two) times daily.   Yes [provider]  montelukast (SINGULAIR) 10 MG tablet  Take 10 mg by mouth at bedtime.     Yes [provider]  ondansetron (ZOFRAN) 4 MG tablet Take 4 mg by mouth every 8 (eight) hours as needed for nausea or vomiting.   Yes [provider]  polyethylene glycol (MIRALAX / GLYCOLAX) packet Take 17 g by mouth daily as needed for mild constipation. 07/12/15  Yes Perkins, Alexzandrew L, PA-C  Propylene Glycol-Glycerin (SOOTHE) 0.6-0.6 % SOLN Apply 1 drop to eye 2 (two) times daily. 11/28/15  Yes Domenic Moras, PA-C  senna (SENOKOT) 8.6 MG TABS tablet Take 1 tablet by mouth daily as needed for mild constipation.   Yes [provider]  sodium phosphate (FLEET) 7-19 GM/118ML ENEM Place 1 enema rectally daily as needed for severe constipation.   Yes [provider]    Physical Exam: Vitals:   03/15/17 0317 03/15/17 0330 03/15/17 0345 03/15/17 0437  BP:  104/61 115/66 (!) 113/44  Pulse:  94 95 98  Resp:  16 16 18   Temp:    98.2 F (36.8 C)  TempSrc:    Oral  SpO2: 96% 96% 97% 96%   General: Not in acute distress HEENT:       Eyes: PERRL, EOMI, no scleral icterus.       ENT: No discharge from the ears and nose, no pharynx injection, no tonsillar enlargement.        Neck: No JVD, no bruit, no mass felt. Heme: No neck lymph node enlargement. Cardiac: S1/S2, RRR, No murmurs, No gallops or rubs. Respiratory: has decreased air movement bilaterally. Has wheezing bilaterally. GI: Soft, nondistended, nontender, no rebound pain, no organomegaly, BS present. GU: No hematuria Ext: has trace leg edema bilaterally. 2+DP/PT pulse bilaterally. Musculoskeletal: No joint deformities, No joint redness or warmth, no limitation of ROM in spin. Skin: No rashes. Has a small skin tear in R forehead which is sutured up. Neuro: Alert, oriented X3, cranial nerves II-XII grossly intact, moves all extremities normally.  Psych: Patient is not psychotic, no suicidal or hemocidal ideation.  Labs on Admission: I have personally reviewed  following labs and imaging studies  CBC: Recent Labs  Lab 03/15/17 0210  WBC 9.0  NEUTROABS 8.5*  HGB 14.9  HCT 46.4*  MCV 98.9  PLT 017   Basic Metabolic Panel: Recent Labs  Lab 03/15/17 0210  NA 139  K 3.7  CL 99*  CO2 26  GLUCOSE 185*  BUN 15  CREATININE 0.67  CALCIUM 9.4   GFR: CrCl cannot be calculated (Unknown ideal weight.). Liver Function Tests: Recent Labs  Lab 03/15/17 0210  AST 23  ALT 15  ALKPHOS 115  BILITOT 0.4  PROT 6.8  ALBUMIN 3.4*   No results for input(s): LIPASE, AMYLASE in the last 168 hours. No results for input(s): AMMONIA in the last 168 hours. Coagulation Profile: No results for input(s): INR, PROTIME in the last 168 hours. Cardiac Enzymes: No results for input(s): CKTOTAL, CKMB, CKMBINDEX, TROPONINI in the last 168 hours. BNP (last 3 results) No results for input(s): PROBNP in the last 8760 hours. HbA1C: No results for input(s): HGBA1C in the last 72 hours. CBG: Recent Labs  Lab 03/15/17 0312  GLUCAP 187*   Lipid Profile: No results for input(s): CHOL, HDL, LDLCALC, TRIG, CHOLHDL, LDLDIRECT in the last 72 hours. Thyroid Function Tests: No results for input(s): TSH, T4TOTAL, FREET4, T3FREE, THYROIDAB in the last 72 hours. Anemia Panel: No results for input(s): VITAMINB12, FOLATE, FERRITIN, TIBC, IRON, RETICCTPCT in the last 72 hours. Urine analysis:    Component Value Date/Time   COLORURINE YELLOW 11/08/2016 0922   APPEARANCEUR CLEAR 11/08/2016 0922   LABSPEC 1.010 11/08/2016 0922   PHURINE 6.0 11/08/2016 0922   GLUCOSEU NEGATIVE 11/08/2016 0922   HGBUR SMALL (A) 11/08/2016 0922   BILIRUBINUR NEGATIVE 11/08/2016 0922   BILIRUBINUR n 09/25/2010 1525   KETONESUR NEGATIVE 11/08/2016 0922   PROTEINUR 30 (A) 11/08/2016 0922   UROBILINOGEN 0.2 09/25/2010 1525   UROBILINOGEN 0.2 01/04/2007 0900   NITRITE POSITIVE (A) 11/08/2016 0922   LEUKOCYTESUR NEGATIVE 11/08/2016 0922   Sepsis  Labs: @LABRCNTIP (procalcitonin:4,lacticidven:4) )No results found for this or any previous visit (from the past 240 hour(s)).   Radiological Exams on Admission: Ct Head Wo Contrast  Result Date: 03/14/2017 CLINICAL DATA:  Fall tonight. EXAM: CT HEAD WITHOUT CONTRAST CT CERVICAL SPINE WITHOUT CONTRAST TECHNIQUE: Multidetector CT imaging of the head and cervical spine was performed following the standard protocol without intravenous contrast. Multiplanar CT image reconstructions of the cervical spine were also generated. COMPARISON:  11/28/2015 FINDINGS: CT HEAD FINDINGS Brain: Mild cerebral atrophy. Ventricular dilatation likely due to central atrophy. Low-attenuation changes in the periventricular white matter consistent small vessel ischemia. No mass effect or midline shift. No abnormal extra-axial fluid collections. Gray-white matter junctions are distinct. Basal cisterns are not effaced. No acute intracranial hemorrhage. Vascular: Intracranial arterial vascular calcifications are present. Skull: Calvarium appears intact. Sinuses/Orbits: Paranasal sinuses and mastoid air cells are not opacified. Other: Small subcutaneous scalp hematoma over the right anterior frontal region. Probable small laceration. CT CERVICAL SPINE FINDINGS Examination is technically limited due to motion artifact. Alignment: Straightening of the usual cervical lordosis is likely postoperative. No anterior subluxation. Normal alignment of the facet joints. Skull base and vertebrae: Skull base appears intact. Severe degenerative changes at C1 to with mild compression of the left lateral mass of C1, likely chronic diffuse bone demineralization. Postoperative changes with anterior plate and screw fixation and intervertebral fusion from C4 through C7. Fusion of the posterior elements with cerclage wire. Few segments appear intact. No destructive or expansile bone lesions. Soft tissues and spinal canal: No prevertebral soft tissue swelling.  No paraspinal soft tissue mass or infiltration. Disc levels: Fusion from C3 through C7 as discussed. Degenerative changes at C1 to, C2-3, and C7-T1 levels. Upper chest: Scarring in the right lung apex. Other: None. IMPRESSION: 1. No acute intracranial abnormalities. Chronic atrophy and small vessel ischemic changes. 2.  Postoperative anterior fixation and intervertebral fusion from C4 through C7. Posterior fusion with cerclage wire. Fused segments appear intact. Straightening of the usual cervical lordosis is likely postoperative. Severe degenerative changes at C1 to. No acute displaced fractures identified. Electronically Signed   By: Lucienne Capers M.D.   On: 03/14/2017 23:44   Ct Cervical Spine Wo Contrast  Result Date: 03/14/2017 CLINICAL DATA:  Fall tonight. EXAM: CT HEAD WITHOUT CONTRAST CT CERVICAL SPINE WITHOUT CONTRAST TECHNIQUE: Multidetector CT imaging of the head and cervical spine was performed following the standard protocol without intravenous contrast. Multiplanar CT image reconstructions of the cervical spine were also generated. COMPARISON:  11/28/2015 FINDINGS: CT HEAD FINDINGS Brain: Mild cerebral atrophy. Ventricular dilatation likely due to central atrophy. Low-attenuation changes in the periventricular white matter consistent small vessel ischemia. No mass effect or midline shift. No abnormal extra-axial fluid collections. Gray-white matter junctions are distinct. Basal cisterns are not effaced. No acute intracranial hemorrhage. Vascular: Intracranial arterial vascular calcifications are present. Skull: Calvarium appears intact. Sinuses/Orbits: Paranasal sinuses and mastoid air cells are not opacified. Other: Small subcutaneous scalp hematoma over the right anterior frontal region. Probable small laceration. CT CERVICAL SPINE FINDINGS Examination is technically limited due to motion artifact. Alignment: Straightening of the usual cervical lordosis is likely postoperative. No anterior  subluxation. Normal alignment of the facet joints. Skull base and vertebrae: Skull base appears intact. Severe degenerative changes at C1 to with mild compression of the left lateral mass of C1, likely chronic diffuse bone demineralization. Postoperative changes with anterior plate and screw fixation and intervertebral fusion from C4 through C7. Fusion of the posterior elements with cerclage wire. Few segments appear intact. No destructive or expansile bone lesions. Soft tissues and spinal canal: No prevertebral soft tissue swelling. No paraspinal soft tissue mass or infiltration. Disc levels: Fusion from C3 through C7 as discussed. Degenerative changes at C1 to, C2-3, and C7-T1 levels. Upper chest: Scarring in the right lung apex. Other: None. IMPRESSION: 1. No acute intracranial abnormalities. Chronic atrophy and small vessel ischemic changes. 2. Postoperative anterior fixation and intervertebral fusion from C4 through C7. Posterior fusion with cerclage wire. Fused segments appear intact. Straightening of the usual cervical lordosis is likely postoperative. Severe degenerative changes at C1 to. No acute displaced fractures identified. Electronically Signed   By: Lucienne Capers M.D.   On: 03/14/2017 23:44   Dg Chest Port 1 View  Result Date: 03/14/2017 CLINICAL DATA:  Shortness of breath, fall. EXAM: PORTABLE CHEST 1 VIEW COMPARISON:  Chest x-ray dated 11/09/2016. FINDINGS: Heart size and mediastinal contours are within normal limits. Lungs are clear. No pleural effusion or pneumothorax seen. Osseous structures about the chest are unremarkable. IMPRESSION: No active disease. Electronically Signed   By: Franki Cabot M.D.   On: 03/14/2017 23:38     EKG: Independently reviewed.  Sinus rhythm, QTC 453, artificial effects on baseline, anteroseptal infarction pattern, low voltage.   Assessment/Plan Principal Problem:   COPD with acute exacerbation (HCC) Active Problems:   Essential hypertension    Spinal stenosis   Acute on chronic respiratory failure with hypoxia ([place])   Fall   Depression with anxiety   COPD exacerbation (HCC)   Asthma exacerbation   Acute on chronic respiratory failure with hypoxia due to COPD/Asthma exacerbation: Chest x-rays negative. Patient was treated with albuterol nebulizer and 2 g of magnesium in Ed. Clinically patient has been improving. Pt refused BIPAP.  -will admit patient to telemetry bed  -Nebulizers: scheduled Duoneb and prn albuterol -Solu-Medrol  60 mg IV tid - continue Singulair, Dulera inhaler -Z pak -Tessalon for cough  -Incentive spirometry -Urine S. pneumococcal antigen -Follow up blood culture x2, sputum culture, respiratory virus panel, Flu pcr -Nasal cannula oxygen as needed to maintain O2 saturation 92% or greater  HTN:  -Continue home medications:HCTZ, metoprolol -IV hydralazine prn  Spinal stenosis: -Continue home on Robaxin, ibuprofen, Norco, Neurontin  Fall: seems to have had a mechanical fall. CT head and C-spine is negative for acute abnormalities. -PT/OT  Depression and anxiety: Stable, no suicidal or homicidal ideations. -Continue home medications: Wellbutrin, Klonopin, Lexapro  DVT ppx: SCd Code Status: Full code Family Communication: None at bed side.   Disposition Plan:  Anticipate discharge back to previous assisted living facility Consults called:  none Admission status:  Inpatient/tele     Date of Service 03/15/2017    [name] [facility] Pager [phone]  If 7PM-7AM, please contact night-coverage [email or web address] Password [facility] 03/15/2017, 4:47 AM

## 2017-03-15 NOTE — Progress Notes (Signed)
Patient is being transferred to [facility] accompanied by Rapid Response RN and RT.  RN called report to [employer], Therapist, sports.  Patient tolerating BIPAP well.  Patient continues to be very drowsy, but does open eyes to painful stimuli and speaks at times. VSS.  P.J. Linus Mako, RN

## 2017-03-15 NOTE — Evaluation (Signed)
Physical Therapy Evaluation Patient Details Name: [name] MRN: [id number] DOB: [birth year] Today's Date: 03/15/2017   History of Present Illness  Pt adm with COPD exacerbation and fall. PMH - copd, dm, htn, breast CA, cervical fusion, bil TKR, rt THR, back surgery.  Clinical Impression  Pt presents to PT with poor functional mobility due to weakness and poor balance. Recommend SNF at this time. Pt currently lives at [facility]. If pt/family choose to have pt return to ALF and the ALF is able to provide incr level of care then would have pt resume [facility] services.    Follow Up Recommendations SNF(Unless ALF able to provide incr level of care)    Equipment Recommendations  None recommended by PT    Recommendations for Other Services       Precautions / Restrictions        Mobility  Bed Mobility Overal bed mobility: Needs Assistance Bed Mobility: Supine to Sit     Supine to sit: Min assist;HOB elevated     General bed mobility comments: Assist to elevate trunk into sitting. Incr time and effort.  Transfers Overall transfer level: Needs assistance Equipment used: Rolling walker (2 wheeled);None Transfers: Sit to/from [facility] Sit to Stand: Mod assist;Min assist Stand pivot transfers: Min assist       General transfer comment: Pt first attempted to stand and pivot to the recliner without assistive device. Needed mod assist to bring hips up and then was leaning heavily posteriorly and returned to sitting on the EOB. On second attempt used walker and was able to stand and pivot with shuffling steps to the recliner. HR to 120's.  Ambulation/Gait             General Gait Details: Not attempted  Stairs            Wheelchair Mobility    Modified Rankin (Stroke Patients Only)       Balance Overall balance assessment: Needs assistance Sitting-balance support: Feet supported;No upper extremity supported Sitting balance-Leahy Scale: Fair      Standing balance support: Bilateral upper extremity supported Standing balance-Leahy Scale: Poor Standing balance comment: walker and min assist for static standing                             Pertinent Vitals/Pain Pain Assessment: 0-10 Pain Score: 6  Pain Location: posterior neck Pain Descriptors / Indicators: Aching    Home Living Family/patient expects to be discharged to:: Assisted living               Home Equipment: Wheelchair - [employer] - 2 wheels;Toilet riser Additional Comments: [facility]    Prior Function Level of Independence: Needs assistance   Gait / Transfers Assistance Needed: self propels her w/c, modified independent with transfers. Amb with rolling walker with assist only           Hand Dominance   Dominant Hand: Right    Extremity/Trunk Assessment   Upper Extremity Assessment Upper Extremity Assessment: Defer to OT evaluation    Lower Extremity Assessment Lower Extremity Assessment: Generalized weakness    Cervical / Trunk Assessment Cervical / Trunk Assessment: Kyphotic  Communication   Communication: No difficulties  Cognition Arousal/Alertness: Awake/alert Behavior During Therapy: WFL for tasks assessed/performed Overall Cognitive Status: No family/caregiver present to determine baseline cognitive functioning  General Comments: At times patient giving multiple versions/answers when asked about recent level of function. First pt told me she fell in her closet trying to get to w/c but then corrected herself and said she fell in the bathroom when she was reaching down to get something from off the floor while sitting in her w/c.      General Comments      Exercises     Assessment/Plan    PT Assessment Patient needs continued PT services  PT Problem List Decreased strength;Decreased activity tolerance;Decreased balance;Decreased mobility;Decreased  cognition;Decreased knowledge of use of DME;Obesity       PT Treatment Interventions DME instruction;Gait training;Functional mobility training;Therapeutic activities;Therapeutic exercise;Balance training;Patient/family education    PT Goals (Current goals can be found in the Care Plan section)  Acute Rehab PT Goals Patient Stated Goal: return home PT Goal Formulation: With patient Time For Goal Achievement: 03/29/17 Potential to Achieve Goals: Good    Frequency Min 3X/week   Barriers to discharge        Co-evaluation               AM-PAC PT "6 Clicks" Daily Activity  Outcome Measure Difficulty turning over in bed (including adjusting bedclothes, sheets and blankets)?: Unable Difficulty moving from lying on back to sitting on the side of the bed? : Unable Difficulty sitting down on and standing up from a chair with arms (e.g., wheelchair, bedside commode, etc,.)?: Unable Help needed moving to and from a bed to chair (including a wheelchair)?: A Lot Help needed walking in hospital room?: Total Help needed climbing 3-5 steps with a railing? : Total 6 Click Score: 7    End of Session Equipment Utilized During Treatment: Gait belt Activity Tolerance: Patient limited by fatigue Patient left: in chair;with call bell/phone within reach;with chair alarm set Nurse Communication: Mobility status PT Visit Diagnosis: Unsteadiness on feet (R26.81);Other abnormalities of gait and mobility (R26.89);History of falling (Z91.81);Muscle weakness (generalized) (M62.81)    Time: 5329-9242 PT Time Calculation (min) (ACUTE ONLY): 37 min   Charges:   PT Evaluation $PT Eval Moderate Complexity: 1 Mod PT Treatments $Therapeutic Activity: 8-22 mins   PT G Codes:        [facility] PT [place] 03/15/2017, 2:00 PM

## 2017-03-15 NOTE — Clinical Social Work Placement (Signed)
   CLINICAL SOCIAL WORK PLACEMENT  NOTE  Date:  03/15/2017  Patient Details  Name: [name] MRN: [id number] Date of Birth: Nov 25, 1930  Clinical Social Work is seeking post-discharge placement for this patient at the [place] level of care (*CSW will initial, date and re-position this form in  chart as items are completed):  Yes   Patient/family provided with [place] Work Department's list of facilities offering this level of care within the geographic area requested by the patient (or if unable, by the patient's family).  Yes   Patient/family informed of their freedom to choose among providers that offer the needed level of care, that participate in Medicare, Medicaid or managed care program needed by the patient, have an available bed and are willing to accept the patient.  Yes   Patient/family informed of [facility]'s ownership interest in [facility] and [facility], as well as of the fact that they are under no obligation to receive care at these facilities.  PASRR submitted to EDS on 03/15/17     PASRR number received on       Existing PASRR number confirmed on 03/15/17     FL2 transmitted to all facilities in geographic area requested by pt/family on 03/15/17     FL2 transmitted to all facilities within larger geographic area on       Patient informed that his/her managed care company has contracts with or will negotiate with certain facilities, including the following:            Patient/family informed of bed offers received.  Patient chooses bed at       Physician recommends and patient chooses bed at      Patient to be transferred to   on  .  Patient to be transferred to facility by       Patient family notified on   of transfer.  Name of family member notified:        PHYSICIAN Please sign FL2     Additional Comment:    _______________________________________________ Candie Chroman, LCSW 03/15/2017, 4:42  PM

## 2017-03-15 NOTE — Progress Notes (Signed)
Mariah Corpus, NP returned page and wants patient to be placed on BIPAP if she will tolerate it and then move patient to Step Down.  RN spoke with RT and RT contacted [facility] for assistance.  Patient placed on BIPAP and is tolerating well. P.J. Linus Mako, RN

## 2017-03-15 NOTE — Progress Notes (Signed)
RN paged Jeannette Corpus, NP to inform her patient had refused BIPAP in ED and has been transferred to our unit, but at this time patient is extremely drowsy and not likely to refuse.  RN requested NP to advise and is awaiting a response. P.J. Linus Mako, RN

## 2017-03-15 NOTE — Progress Notes (Signed)
OT Evaluation  PTA, pt living in ALF [facility]) and was able to complete wc transfers at modified independent level and required min A with ADL. Pt currently requires mod A with mobility and ADL. Pt at high risk for falls and easily fatigues. Pt wants to go [facility] with [facility], and states she needs to be at home with her husband. Do not feel pt is safe at this time to DC back to ALF due to high risk of falls and poor endurance. Recommend pt participate with rehab at SNF to facilitate safe DC home. VSS during session.Will follow acutely to address established goals.    03/15/17 1300  OT Visit Information  Last OT Received On 03/15/17  History of Present Illness Pt adm with COPD exacerbation and fall. PMH - copd, dm, htn, breast CA, cervical fusion, bil TKR, rt THR, back surgery.  Precautions  Precautions Fall  Home Living  Family/patient expects to be discharged to: Assisted living  Home Equipment Wheelchair - manual;Walker - 2 wheels;Toilet riser  Additional Comments [facility]  Prior Function  Level of Independence Needs assistance  Gait / Transfers Assistance Needed self propels her w/c, modified independent with transfers. Amb with rolling walker with assist only  ADL's / [place] she had a "little help with bathing adn dressing  Comments States she helps her husbnad  Communication  Communication No difficulties  Pain Assessment  Pain Assessment Faces  Faces Pain Scale 4  Pain Location posterior neck  Pain Descriptors / Indicators Aching  Pain Intervention(s) Repositioned  Cognition  Arousal/Alertness Awake/alert  Behavior During Therapy WFL for tasks assessed/performed  Overall Cognitive Status No family/caregiver present to determine baseline cognitive functioning  General Comments most likely baseline; decreased awareness of deficits/decreased safety/judgement  Upper Extremity Assessment  Upper Extremity Assessment Generalized weakness  Lower  Extremity Assessment  Lower Extremity Assessment Defer to PT evaluation  Cervical / Trunk Assessment  Cervical / Trunk Assessment Kyphotic  ADL  Overall ADL's  Needs assistance/impaired  Grooming Minimal assistance;Brushing hair  Grooming Details (indicate cue type and reason) set up for oral care  Upper Body Bathing Set up;Supervision/ safety;Sitting  Lower Body Bathing Moderate assistance;Sit to/from stand  Upper Body Dressing  Minimal assistance;Sitting  Lower Body Dressing Moderate assistance;Sit to/from stand  Functional mobility during ADLs Moderate assistance (falls posteriorly)  Vision- History  Baseline Vision/History Wears glasses  Bed Mobility  General bed mobility comments OOB in chair  Transfers  Overall transfer level Needs assistance  Transfers Sit to/from Stand  Sit to Stand Mod assist  General transfer comment Falls posteriorly in standing.  Balance  Overall balance assessment History of Falls  Sitting balance-Leahy Scale Fair  Standing balance-Leahy Scale Poor  OT - End of Session  Equipment Utilized During Treatment Gait belt  Activity Tolerance Patient tolerated treatment well  Patient left in chair;with call bell/phone within reach;with chair alarm set  Nurse Communication Mobility status  OT Assessment  OT Recommendation/Assessment Patient needs continued OT Services  OT Visit Diagnosis Unsteadiness on feet (R26.81);Muscle weakness (generalized) (M62.81);Pain  Pain - part of body (neck)  OT Problem List Decreased strength;Decreased activity tolerance;Impaired balance (sitting and/or standing);Decreased safety awareness;Decreased knowledge of use of DME or AE;Cardiopulmonary status limiting activity;Obesity;Pain  OT Plan  OT Frequency (ACUTE ONLY) Min 2X/week  OT Treatment/Interventions (ACUTE ONLY) Self-care/ADL training;Therapeutic exercise;Energy conservation;DME and/or AE instruction;Therapeutic activities;Patient/family education;Balance training   AM-PAC OT "6 Clicks" Daily Activity Outcome Measure  Help from another person eating meals? 4  Help from another person taking care of personal grooming? 3  Help from another person toileting, which includes using toliet, bedpan, or urinal? 2  Help from another person bathing (including washing, rinsing, drying)? 2  Help from another person to put on and taking off regular upper body clothing? 3  Help from another person to put on and taking off regular lower body clothing? 2  6 Click Score 16  ADL G Code Conversion CK  OT Recommendation  Follow Up Recommendations SNF;Supervision/Assistance - 24 hour  OT Equipment Other (comment) (TBA)  Individuals Consulted  Consulted and Agree with Results and Recommendations Patient  Acute Rehab OT Goals  Patient Stated Goal RETURN HOME TO BE WITH HER HUSBAND  OT Goal Formulation With patient  Time For Goal Achievement 03/29/17  Potential to Achieve Goals Good  OT Time Calculation  OT Start Time (ACUTE ONLY) 1340  OT Stop Time (ACUTE ONLY) 1409  OT Time Calculation (min) 29 min  OT General Charges  $OT Visit 1 Visit  OT Evaluation  $OT Eval Moderate Complexity 1 Mod  OT Treatments  $Self Care/Home Management  8-22 mins  Written Expression  Dominant Hand Right  [facility], OT/L  [phone] 03/15/2017

## 2017-03-15 NOTE — ED Provider Notes (Signed)
[facility] Provider Note   CSN: [id number] Arrival date & time: 03/14/17  2237     History   Chief Complaint Chief Complaint  Patient presents with  . Shortness of Breath    HPI GRISSEL English is a 84 y.o. female with a hx of asthma, arthritis, COPD, frequent UTIs, hypertension, spinal stenosis, on insulin dependent diabetes presents to the Emergency Department via EMS from assisted living facility after unwitnessed fall onset approximately 30 minutes prior to arrival.  Patient reports she was sitting on the toilet when she bent over to pull up her underwear and lost her balance, striking the right side of her head on the floor.  She denies loss of consciousness.  Patient denies syncope or aura prior to fall.  Additionally patient with significant respiratory distress.  Per EMS patient received 15 mg of albuterol, 0.5 mg of Atrovent and 125 mg of Solu-Medrol in route without improvement.  Patient reports she does not wear oxygen at home.  Patient was admitted in September 2018 for sepsis due to a right lower lobe pneumonia and UTI.  Patient reports she has not had any urinary symptoms, fevers or chills.  No abdominal pain, nausea or vomiting.     The history is provided by medical records and the patient. No language interpreter was used.    Past Medical History:  Diagnosis Date  . Arthritis   . Asthma   . Breast cancer ([place]) 2006   right w/lumpectomy  . COPD (chronic obstructive pulmonary disease) ([place])   . Depression   . Diabetes ([place]) 2007  . Diverticulosis of colon   . Endometrial hyperplasia 01/1996   fibroids, adenomyosis  . Frequent UTI   . History of colon polyps   . Hypertension   . Insomnia   . Osteopenia 12/08   hip  . Spinal stenosis 2001  . Vitamin D deficiency     Patient Active Problem List   Diagnosis Date Noted  . Acute on chronic respiratory failure with hypoxia ([place]) 03/15/2017  . Fall 03/15/2017  . Depression with anxiety  03/15/2017  . COPD exacerbation ([place]) 03/15/2017  . Asthma exacerbation 03/15/2017  . COPD with acute exacerbation ([place])   . Acute lower UTI   . Acute respiratory distress 11/08/2016  . Sepsis ([place]) 11/08/2016  . Mild persistent asthma without complication 40/98/1191  . Morbid obesity due to excess calories ([place]) 09/02/2016  . Esophageal dysfunction 09/01/2016  . PNA (pneumonia) 08/09/2016  . Acute respiratory failure ([place]) 08/09/2016  . Wheelchair bound 08/09/2016  . Wound healing, delayed 08/09/2016  . Acute hypoxemic respiratory failure ([place]) 08/09/2016  . HCAP (healthcare-associated pneumonia) 07/07/2016  . Chronic ulcer of right leg ([place]) 07/07/2016  . SIRS (systemic inflammatory response syndrome) ([place]) 05/08/2016  . Community acquired pneumonia 05/08/2016  . Failed total hip arthroplasty ([place]) 07/10/2015  . Breast cancer, right breast ([place]) 07/18/2014  . CONCUSSION WITH LOC OF UNSPECIFIED DURATION 07/19/2009  . OTH SYMPTOMS INVOLVING RESPIRATORY SYSTEM&CHEST 11/12/2008  . UNSPECIFIED DISORDER OF ANKLE AND FOOT JOINT 05/11/2008  . CERUMEN IMPACTION, BILATERAL 05/04/2008  . Dysmetabolic syndrome X 47/82/9562  . UNS ADVRS EFF UNS RX MEDICINAL&BIOLOGICAL SBSTNC 08/12/2007  . Other and unspecified hyperlipidemia 04/01/2007  . INSOMNIA, PERSISTENT 09/09/2006  . Depression 09/09/2006  . Essential hypertension 09/09/2006  . Asthma, chronic, unspecified asthma severity, with acute exacerbation 09/09/2006  . DIVERTICULOSIS, COLON 09/09/2006  . Osteoarthritis 09/09/2006  . Spinal stenosis 09/09/2006  . COLONIC POLYPS, HX OF 09/09/2006  Past Surgical History:  Procedure Laterality Date  . BILATERAL SALPINGOOPHORECTOMY  12/97  . BREAST LUMPECTOMY Right 03/2003  . CARPAL TUNNEL RELEASE    . CERVICAL FUSION    . CERVICAL LAMINECTOMY  1975   x4  . CHOLECYSTECTOMY  11/00   lap chole  . HYSTEROSCOPY  10/97   D&C, postmenopausal bleeding  . LUMBAR LAMINECTOMY  10/06   with  spinal stenosis  . manipulation of hip for dislocation    . ROTATOR CUFF REPAIR Right 11/96  . TONSILLECTOMY AND ADENOIDECTOMY    . TOTAL ABDOMINAL HYSTERECTOMY  12/97   endo hyperplasia, fibroids, adenomyosis  . TOTAL HIP ARTHROPLASTY Right 8/00  . TOTAL HIP REVISION Right 07/10/2015   Procedure: RIGHT ACETABULAR VS TOTAL HIP REVISION;  Surgeon: Gaynelle Arabian, MD;  Location: [facility];  Service: Orthopedics;  Laterality: Right;  . TOTAL KNEE ARTHROPLASTY Left 06/2003  . TOTAL KNEE ARTHROPLASTY Right 10/06  . TUBAL LIGATION Bilateral 1968    OB History    Gravida Para Term Preterm AB Living   3 3 3          SAB TAB Ectopic Multiple Live Births                   Home Medications    Prior to Admission medications   Medication Sig Start Date End Date Taking? Authorizing Provider  acetaminophen (TYLENOL) 325 MG tablet Take 2 tablets (650 mg total) by mouth every 6 (six) hours as needed for mild pain (or Fever >/= 101). 07/12/15  Yes Perkins, Alexzandrew L, PA-C  acetaminophen (TYLENOL) 650 MG CR tablet Take 650-1,300 mg by mouth every 12 (twelve) hours as needed for pain.   Yes [provider]  albuterol (PROAIR HFA) 108 (90 BASE) MCG/ACT inhaler Inhale 2 puffs into the lungs every 4 (four) hours as needed for shortness of breath.    Yes [provider]  aspirin EC 325 MG tablet Take 325 mg by mouth daily.    Yes [provider]  Benzocaine-Menthol (CEPACOL SORE THROAT) 15-2.6 MG LOZG Use as directed 1 lozenge in the mouth or throat every 4 (four) hours as needed (for sore throat).   Yes [provider]  benzonatate (TESSALON) 200 MG capsule Take 200 mg by mouth 3 (three) times daily as needed for cough.   Yes [provider]  buPROPion (WELLBUTRIN XL) 300 MG 24 hr tablet Take 300 mg by mouth every morning.    Yes [provider]  Calcium Carb-Cholecalciferol (CALCIUM+D3) 600-800 MG-UNIT TABS Take 1 tablet by mouth daily.   Yes [provider]  ciclopirox (PENLAC) 8 % solution Apply topically daily. Over nail and surrounding skin   Yes [provider]  clonazePAM (KLONOPIN) 1 MG tablet Take 1 tablet (1 mg total) by mouth at bedtime as needed (insomnia). Patient taking differently: Take 1 mg by mouth at bedtime as needed for anxiety.  08/12/16  Yes Florencia Reasons, MD  docusate sodium (COLACE) 100 MG capsule Take 100 mg by mouth daily.   Yes [provider]  escitalopram (LEXAPRO) 10 MG tablet Take 10 mg by mouth daily.  04/20/11  Yes [provider]  fluticasone (FLONASE) 50 MCG/ACT nasal spray Place 1 spray into both nostrils 2 (two) times daily.   Yes [provider]  gabapentin (NEURONTIN) 300 MG capsule TAKE ONE CAPSULE THREE TIMES DAILY 12/14/16  Yes Magrinat, Virgie Dad, MD  hydrochlorothiazide (MICROZIDE) 12.5 MG capsule Take 12.5 mg by mouth  daily.   Yes [provider]  HYDROcodone-acetaminophen (NORCO/VICODIN) 5-325 MG tablet Take 1 tablet by mouth 2 (two) times daily as needed for moderate pain.   Yes [provider]  ibuprofen (ADVIL) 200 MG tablet Take 200 mg by mouth every 12 (twelve) hours as needed (pain).    Yes [provider]  methocarbamol (ROBAXIN) 500 MG tablet Take 1 tablet (500 mg total) by mouth every 6 (six) hours as needed for muscle spasms. 07/12/15  Yes Perkins, Alexzandrew L, PA-C  metoprolol tartrate (LOPRESSOR) 25 MG tablet Take 0.5 tablets (12.5 mg total) by mouth 2 (two) times daily. 08/12/16  Yes Florencia Reasons, MD  miconazole (MICOTIN) 2 % powder Apply topically 2 (two) times daily. To area under breasts and to bilateral groin after miconazole cream   Yes [provider]  mirabegron ER (MYRBETRIQ) 50 MG TB24 tablet Take 50 mg by mouth daily.   Yes [provider]  mometasone-formoterol (DULERA) 200-5 MCG/ACT AERO Inhale 1 puff into the lungs 2 (two) times daily.   Yes [provider]  montelukast (SINGULAIR) 10 MG tablet  Take 10 mg by mouth at bedtime.     Yes [provider]  ondansetron (ZOFRAN) 4 MG tablet Take 4 mg by mouth every 8 (eight) hours as needed for nausea or vomiting.   Yes [provider]  polyethylene glycol (MIRALAX / GLYCOLAX) packet Take 17 g by mouth daily as needed for mild constipation. 07/12/15  Yes Perkins, Alexzandrew L, PA-C  Propylene Glycol-Glycerin (SOOTHE) 0.6-0.6 % SOLN Apply 1 drop to eye 2 (two) times daily. 11/28/15  Yes Domenic Moras, PA-C  senna (SENOKOT) 8.6 MG TABS tablet Take 1 tablet by mouth daily as needed for mild constipation.   Yes [provider]  sodium phosphate (FLEET) 7-19 GM/118ML ENEM Place 1 enema rectally daily as needed for severe constipation.   Yes [provider]    Family History Family History  Problem Relation Age of Onset  . Diabetes Father   . Colon cancer Father 54  . Heart failure Father   . Arthritis Mother   . Thyroid disease Mother   . Osteoporosis Mother   . Diabetes Brother   . Diabetes Brother   . Osteoporosis Maternal Aunt   . Osteoporosis Maternal Grandmother     Social History Social History   Tobacco Use  . Smoking status: Former Smoker    Packs/day: 0.25    Years: 10.00    Pack years: 2.50    Types: Cigarettes    Last attempt to quit: 04/24/1990    Years since quitting: 26.9  . Smokeless tobacco: Never Used  Substance Use Topics  . Alcohol use: Yes    Alcohol/week: 2.4 oz    Types: 4 Glasses of wine per week    Comment: occ  . Drug use: No     Allergies   Oxycodone hcl [oxycodone hcl]; Morphine sulfate; and Sulfamethoxazole   Review of Systems Review of Systems  Constitutional: Negative for appetite change, diaphoresis, fatigue, fever and unexpected weight change.  HENT: Positive for facial swelling (right forehead). Negative for mouth sores.   Eyes: Negative for visual disturbance.  Respiratory: Positive for shortness of breath and wheezing. Negative for cough and chest  tightness.   Cardiovascular: Negative for chest pain.  Gastrointestinal: Negative for abdominal pain, constipation, diarrhea, nausea and vomiting.  Endocrine: Negative for polydipsia, polyphagia and polyuria.  Genitourinary: Negative for dysuria, frequency, hematuria and urgency.  Musculoskeletal: Negative for back  pain and neck stiffness.  Skin: Positive for wound. Negative for rash.  Allergic/Immunologic: Negative for immunocompromised state.  Neurological: Negative for syncope, light-headedness and headaches.  Hematological: Does not bruise/bleed easily.  Psychiatric/Behavioral: Negative for sleep disturbance. The patient is not nervous/anxious.      Physical Exam Updated Vital Signs BP 118/63   Pulse 93   Resp 18   SpO2 98%   Physical Exam  Constitutional: She appears well-developed and well-nourished. She appears lethargic. She appears distressed.  Lethargic, respiratory distress  HENT:  Head: Normocephalic. Head is with contusion and with laceration.  Mouth/Throat: Oropharynx is clear and moist. No oropharyngeal exudate.  Large contusion and ecchymosis to the right side of the forehead 2.5 cm laceration Small abrasion to the nose  Eyes: Conjunctivae and EOM are normal. Pupils are equal, round, and reactive to light. No scleral icterus.  Neck: Neck supple. No spinous process tenderness and no muscular tenderness present.  Towel roll in place No midline tenderness to palpation Evidence of previous cervical surgeries with well-healed incision and scarring  Cardiovascular: Normal rate, regular rhythm and intact distal pulses.  Pulses:      Radial pulses are 2+ on the right side, and 2+ on the left side.  Pulmonary/Chest: Accessory muscle usage present. Tachypnea noted. She is in respiratory distress. She has decreased breath sounds. She has wheezes. She has rhonchi.  Equal chest expansion Patient with significant respiratory distress.  Speaking 2-3 word sentences.   Significant retractions and accessory muscle usage.  Audible wheezes.  Rhonchi throughout.  Significantly decreased breath sounds in the lower lobes.  Patient oxygen saturations in the mid 80s when on room air.  Abdominal: Soft. Bowel sounds are normal. She exhibits no mass. There is no tenderness. There is no rebound and no guarding.  Musculoskeletal: Normal range of motion. She exhibits no edema.  Contusion to the right forearm. Full range of motion of the right shoulder elbow wrist and fingers. Unna boot in place bilaterally to the lower extremities.  Skin of the upper calf, knees and thighs is without erythema or warmth.  Neurological: She appears lethargic. GCS eye subscore is 3. GCS verbal subscore is 5. GCS motor subscore is 6.  Patient is lethargic requiring constant stimulation however does answer questions appropriately. Speech is clear and goal oriented, follows commands without difficulty Moves extremities without ataxia Strong grip strength in the bilateral upper extremities  Skin: Skin is warm and dry. She is not diaphoretic.  Psychiatric: She has a normal mood and affect.  Nursing note and vitals reviewed.    ED Treatments / Results  Labs (all labs ordered are listed, but only abnormal results are displayed) Labs Reviewed  CBC WITH DIFFERENTIAL/PLATELET - Abnormal; Notable for the following components:      Result Value   HCT 46.4 (*)    Neutro Abs 8.5 (*)    Lymphs Abs 0.4 (*)    All other components within normal limits  COMPREHENSIVE METABOLIC PANEL - Abnormal; Notable for the following components:   Chloride 99 (*)    Glucose, Bld 185 (*)    Albumin 3.4 (*)    All other components within normal limits  CBG MONITORING, ED - Abnormal; Notable for the following components:   Glucose-Capillary 187 (*)    All other components within normal limits  I-STAT ARTERIAL BLOOD GAS, ED - Abnormal; Notable for the following components:   pH, Arterial 7.316 (*)    pCO2  arterial 66.2 (*)    pO2,  Arterial 237.0 (*)    Bicarbonate 34.1 (*)    TCO2 36 (*)    Acid-Base Excess 5.0 (*)    All other components within normal limits  RESPIRATORY PANEL BY PCR  CULTURE, BLOOD (ROUTINE X 2)  CULTURE, BLOOD (ROUTINE X 2)  CULTURE, EXPECTORATED SPUTUM-ASSESSMENT  GRAM STAIN  URINALYSIS, ROUTINE W REFLEX MICROSCOPIC  INFLUENZA PANEL BY PCR (TYPE A & B)  STREP PNEUMONIAE URINARY ANTIGEN  I-STAT CG4 LACTIC ACID, ED  I-STAT CG4 LACTIC ACID, ED    EKG  EKG Interpretation  Date/Time:  Sunday March 14 2017 22:52:42 EST Ventricular Rate:  86 PR Interval:    QRS Duration: 94 QT Interval:  378 QTC Calculation: 453 R Axis:   7 Text Interpretation:  Sinus rhythm Repol abnrm suggests ischemia, anterolateral difficult baseline  Normal sinus rhythm Confirmed by Thomasene Lot, Courteney [phone]) on 03/15/2017 12:11:46 AM       Radiology Ct Head Wo Contrast  Result Date: 03/14/2017 CLINICAL DATA:  Fall tonight. EXAM: CT HEAD WITHOUT CONTRAST CT CERVICAL SPINE WITHOUT CONTRAST TECHNIQUE: Multidetector CT imaging of the head and cervical spine was performed following the standard protocol without intravenous contrast. Multiplanar CT image reconstructions of the cervical spine were also generated. COMPARISON:  11/28/2015 FINDINGS: CT HEAD FINDINGS Brain: Mild cerebral atrophy. Ventricular dilatation likely due to central atrophy. Low-attenuation changes in the periventricular white matter consistent small vessel ischemia. No mass effect or midline shift. No abnormal extra-axial fluid collections. Gray-white matter junctions are distinct. Basal cisterns are not effaced. No acute intracranial hemorrhage. Vascular: Intracranial arterial vascular calcifications are present. Skull: Calvarium appears intact. Sinuses/Orbits: Paranasal sinuses and mastoid air cells are not opacified. Other: Small subcutaneous scalp hematoma over the right anterior frontal region. Probable small laceration. CT  CERVICAL SPINE FINDINGS Examination is technically limited due to motion artifact. Alignment: Straightening of the usual cervical lordosis is likely postoperative. No anterior subluxation. Normal alignment of the facet joints. Skull base and vertebrae: Skull base appears intact. Severe degenerative changes at C1 to with mild compression of the left lateral mass of C1, likely chronic diffuse bone demineralization. Postoperative changes with anterior plate and screw fixation and intervertebral fusion from C4 through C7. Fusion of the posterior elements with cerclage wire. Few segments appear intact. No destructive or expansile bone lesions. Soft tissues and spinal canal: No prevertebral soft tissue swelling. No paraspinal soft tissue mass or infiltration. Disc levels: Fusion from C3 through C7 as discussed. Degenerative changes at C1 to, C2-3, and C7-T1 levels. Upper chest: Scarring in the right lung apex. Other: None. IMPRESSION: 1. No acute intracranial abnormalities. Chronic atrophy and small vessel ischemic changes. 2. Postoperative anterior fixation and intervertebral fusion from C4 through C7. Posterior fusion with cerclage wire. Fused segments appear intact. Straightening of the usual cervical lordosis is likely postoperative. Severe degenerative changes at C1 to. No acute displaced fractures identified. Electronically Signed   By: Lucienne Capers M.D.   On: 03/14/2017 23:44   Ct Cervical Spine Wo Contrast  Result Date: 03/14/2017 CLINICAL DATA:  Fall tonight. EXAM: CT HEAD WITHOUT CONTRAST CT CERVICAL SPINE WITHOUT CONTRAST TECHNIQUE: Multidetector CT imaging of the head and cervical spine was performed following the standard protocol without intravenous contrast. Multiplanar CT image reconstructions of the cervical spine were also generated. COMPARISON:  11/28/2015 FINDINGS: CT HEAD FINDINGS Brain: Mild cerebral atrophy. Ventricular dilatation likely due to central atrophy. Low-attenuation changes in the  periventricular white matter consistent small vessel ischemia. No mass effect or midline shift. No  abnormal extra-axial fluid collections. Gray-white matter junctions are distinct. Basal cisterns are not effaced. No acute intracranial hemorrhage. Vascular: Intracranial arterial vascular calcifications are present. Skull: Calvarium appears intact. Sinuses/Orbits: Paranasal sinuses and mastoid air cells are not opacified. Other: Small subcutaneous scalp hematoma over the right anterior frontal region. Probable small laceration. CT CERVICAL SPINE FINDINGS Examination is technically limited due to motion artifact. Alignment: Straightening of the usual cervical lordosis is likely postoperative. No anterior subluxation. Normal alignment of the facet joints. Skull base and vertebrae: Skull base appears intact. Severe degenerative changes at C1 to with mild compression of the left lateral mass of C1, likely chronic diffuse bone demineralization. Postoperative changes with anterior plate and screw fixation and intervertebral fusion from C4 through C7. Fusion of the posterior elements with cerclage wire. Few segments appear intact. No destructive or expansile bone lesions. Soft tissues and spinal canal: No prevertebral soft tissue swelling. No paraspinal soft tissue mass or infiltration. Disc levels: Fusion from C3 through C7 as discussed. Degenerative changes at C1 to, C2-3, and C7-T1 levels. Upper chest: Scarring in the right lung apex. Other: None. IMPRESSION: 1. No acute intracranial abnormalities. Chronic atrophy and small vessel ischemic changes. 2. Postoperative anterior fixation and intervertebral fusion from C4 through C7. Posterior fusion with cerclage wire. Fused segments appear intact. Straightening of the usual cervical lordosis is likely postoperative. Severe degenerative changes at C1 to. No acute displaced fractures identified. Electronically Signed   By: Lucienne Capers M.D.   On: 03/14/2017 23:44   Dg  Chest Port 1 View  Result Date: 03/14/2017 CLINICAL DATA:  Shortness of breath, fall. EXAM: PORTABLE CHEST 1 VIEW COMPARISON:  Chest x-ray dated 11/09/2016. FINDINGS: Heart size and mediastinal contours are within normal limits. Lungs are clear. No pleural effusion or pneumothorax seen. Osseous structures about the chest are unremarkable. IMPRESSION: No active disease. Electronically Signed   By: Franki Cabot M.D.   On: 03/14/2017 23:38    Procedures .Marland KitchenLaceration Repair Date/Time: 03/15/2017 2:57 AM Performed by: Abigail Butts, PA-C Authorized by: Abigail Butts, PA-C   Consent:    Consent obtained:  Verbal   Consent given by:  Patient   Risks discussed:  Pain and poor cosmetic result   Alternatives discussed:  No treatment Anesthesia (see MAR for exact dosages):    Anesthesia method:  Local infiltration   Local anesthetic:  Lidocaine 2% WITH epi Laceration details:    Location:  Face   Face location:  Forehead   Length (cm):  2.5 Repair type:    Repair type:  Simple Pre-procedure details:    Preparation:  Patient was prepped and draped in usual sterile fashion and imaging obtained to evaluate for foreign bodies Exploration:    Hemostasis achieved with:  Epinephrine and direct pressure   Wound exploration: entire depth of wound probed and visualized   Treatment:    Area cleansed with:  Saline   Amount of cleaning:  Extensive   Irrigation solution:  Sterile water   Irrigation volume:  172mL   Irrigation method:  Syringe Skin repair:    Repair method:  Sutures   Suture size:  6-0   Suture material:  Prolene   Suture technique:  Simple interrupted   Number of sutures:  2 Approximation:    Approximation:  Close   [place] border: well-aligned   Post-procedure details:    Dressing:  Open (no dressing)   Patient tolerance of procedure:  Tolerated well, no immediate complications .Critical Care Performed by: Abigail Butts, PA-C  Authorized by:  Abigail Butts, PA-C   Critical care provider statement:    Critical care time (minutes):  35   Critical care time was exclusive of:  Separately billable procedures and treating other patients and teaching time   Critical care was necessary to treat or prevent imminent or life-threatening deterioration of the following conditions:  Respiratory failure   Critical care was time spent personally by me on the following activities:  Development of treatment plan with patient or surrogate, discussions with consultants, evaluation of patient's response to treatment, examination of patient, obtaining history from patient or surrogate, ordering and performing treatments and interventions, ordering and review of laboratory studies, ordering and review of radiographic studies, pulse oximetry, re-evaluation of patient's condition and review of old charts   I assumed direction of critical care for this patient from another provider in my specialty: no     (including critical care time)  Medications Ordered in ED Medications  albuterol (PROVENTIL,VENTOLIN) solution continuous neb (10 mg Nebulization New Bag/Given 03/14/17 2333)  acetaminophen (TYLENOL) tablet 650 mg (not administered)  aspirin EC tablet 325 mg (not administered)  menthol-cetylpyridinium (CEPACOL) lozenge 3 mg (not administered)  benzonatate (TESSALON) capsule 200 mg (not administered)  buPROPion (WELLBUTRIN XL) 24 hr tablet 300 mg (not administered)  calcium-vitamin D (OSCAL WITH D) 500-200 MG-UNIT per tablet 1 tablet (not administered)  clonazePAM (KLONOPIN) tablet 1 mg (not administered)  docusate sodium (COLACE) capsule 100 mg (not administered)  escitalopram (LEXAPRO) tablet 10 mg (not administered)  fluticasone (FLONASE) 50 MCG/ACT nasal spray 1 spray (not administered)  gabapentin (NEURONTIN) capsule 300 mg (not administered)  hydrochlorothiazide (MICROZIDE) capsule 12.5 mg (not administered)  HYDROcodone-acetaminophen  (NORCO/VICODIN) 5-325 MG per tablet 1 tablet (not administered)  ibuprofen (ADVIL,MOTRIN) tablet 200 mg (not administered)  methocarbamol (ROBAXIN) tablet 500 mg (not administered)  metoprolol tartrate (LOPRESSOR) tablet 12.5 mg (not administered)  miconazole (MICOTIN) 2 % cream (not administered)  mirabegron ER (MYRBETRIQ) tablet 50 mg (not administered)  mometasone-formoterol (DULERA) 200-5 MCG/ACT inhaler 1 puff (not administered)  montelukast (SINGULAIR) tablet 10 mg (not administered)  polyethylene glycol (MIRALAX / GLYCOLAX) packet 17 g (not administered)  polyvinyl alcohol (LIQUIFILM TEARS) 1.4 % ophthalmic solution 1 drop (not administered)  senna (SENOKOT) tablet 8.6 mg (not administered)  sodium phosphate (FLEET) 7-19 GM/118ML enema 1 enema (not administered)  ipratropium-albuterol (DUONEB) 0.5-2.5 (3) MG/3ML nebulizer solution 3 mL (3 mLs Nebulization Given 03/15/17 0317)  albuterol (PROVENTIL) (2.5 MG/3ML) 0.083% nebulizer solution 2.5 mg (not administered)  methylPREDNISolone sodium succinate (SOLU-MEDROL) 125 mg/2 mL injection 60 mg (not administered)  azithromycin (ZITHROMAX) tablet 500 mg (not administered)    Followed by  azithromycin (ZITHROMAX) tablet 250 mg (not administered)  magnesium sulfate IVPB 2 g 50 mL (0 g Intravenous Stopped 03/15/17 0123)  lidocaine-EPINEPHrine (XYLOCAINE W/EPI) 2 %-1:200000 (PF) injection 20 mL (20 mLs Infiltration Given by Other 03/15/17 0149)     Initial Impression / Assessment and Plan / ED Course  I have reviewed the triage vital signs and the nursing notes.  Pertinent labs & imaging results that were available during my care of the patient were reviewed by me and considered in my medical decision making (see chart for details).  Clinical Course as of Mar 15 516  Mon Mar 15, 2017  0330 Additional albuterol given with continued improvement in breathing  [HM]    Clinical Course User Index [HM] Eulas Schweitzer, Gwenlyn Perking    Presents  with respiratory distress and fall.  Small wound to the  head with significant contusion.  CT scan without acute abnormality including no evidence of intracranial hemorrhage.  I personally evaluated the films.  Wounds cleaned and sutured without complication.  Focal spine without acute fracture or subluxation.  Concern is patient's respiratory distress.  She is lethargic, dyspneic and speaking only approximately 2 word sentences.  Retractions and accessory muscle usage.  Patient with significant desaturation into the mid 80s when oxygen is removed.  I am concerned that she is retaining CO2.  She will need BiPAP, hour long albuterol and magnesium.  Review shows she has a history of respiratory failure.  23:30  Patient is refusing BiPAP.  She will take additional nebulizers but is unwilling/unable to tolerate the BiPAP.  23:50 ABG shows some CO2 retention at 36.  This is some better than expected.  00:30 Patient was some improvement in breathing status after albuterol.  She remains diminished in the bilateral lower lobes but distress has improved.  Magnesium has completed.  02:00 Patient remains sleepy but does arouse.  She does not wear oxygen at home but when oxygen is removed here patient oxygen saturations dropped into the upper 80s to lower 90s.  0245 Discussed with Dr. Blaine Hamper who will admit.   3:11 AM No leukocytosis.  Lactic acid within normal limits.  No evidence of sepsis. Other labs are reassuring.  The patient was discussed with and seen by Dr. Thomasene Lot who agrees with the treatment plan.   Final Clinical Impressions(s) / ED Diagnoses   Final diagnoses:  COPD exacerbation ([place])  Respiratory distress  Fall, initial encounter  Laceration of forehead, initial encounter    ED Discharge Orders    None       Dhillon [place], Gwenlyn Perking 03/15/17 0315    Gentri Guardado, Jarrett Soho, PA-C 03/15/17 0518    Macarthur Critchley, MD 03/16/17 1505

## 2017-03-15 NOTE — NC FL2 (Signed)
[place] LEVEL OF CARE SCREENING TOOL     IDENTIFICATION  Patient Name: [name] Birthdate: [birth year] Sex: female Admission Date (Current Location): 03/14/2017  [facility] and [place] Number:  Herbalist and Address:  [facility] [place], [place] [address] [place], [place] [address]      Provider Number: [id number]  Attending Physician Name and Address:  Debbe Odea, MD  Relative Name and Phone Number:       Current Level of Care: Hospital Recommended Level of Care: [place] Prior Approval Number:    Date Approved/Denied:   PASRR Number: [id number]  Discharge Plan: SNF    Current Diagnoses: Patient Active Problem List   Diagnosis Date Noted  . Acute on chronic respiratory failure with hypoxia ([place]) 03/15/2017  . Fall 03/15/2017  . Depression with anxiety 03/15/2017  . COPD exacerbation ([place]) 03/15/2017  . Asthma exacerbation 03/15/2017  . COPD with acute exacerbation ([place])   . Acute lower UTI   . Acute respiratory distress 11/08/2016  . Sepsis ([place]) 11/08/2016  . Mild persistent asthma without complication 34/19/3790  . Morbid obesity due to excess calories ([place]) 09/02/2016  . Esophageal dysfunction 09/01/2016  . PNA (pneumonia) 08/09/2016  . Acute respiratory failure ([place]) 08/09/2016  . Wheelchair bound 08/09/2016  . Wound healing, delayed 08/09/2016  . Acute hypoxemic respiratory failure ([place]) 08/09/2016  . HCAP (healthcare-associated pneumonia) 07/07/2016  . Chronic ulcer of right leg ([place]) 07/07/2016  . SIRS (systemic inflammatory response syndrome) ([place]) 05/08/2016  . Community acquired pneumonia 05/08/2016  . Failed total hip arthroplasty ([place]) 07/10/2015  . Breast cancer, right breast ([place]) 07/18/2014  . CONCUSSION WITH LOC OF UNSPECIFIED DURATION 07/19/2009  . OTH SYMPTOMS INVOLVING RESPIRATORY SYSTEM&CHEST 11/12/2008  . UNSPECIFIED DISORDER OF ANKLE AND FOOT JOINT 05/11/2008  . CERUMEN  IMPACTION, BILATERAL 05/04/2008  . Dysmetabolic syndrome X 24/10/7351  . UNS ADVRS EFF UNS RX MEDICINAL&BIOLOGICAL SBSTNC 08/12/2007  . Other and unspecified hyperlipidemia 04/01/2007  . INSOMNIA, PERSISTENT 09/09/2006  . Depression 09/09/2006  . Essential hypertension 09/09/2006  . Asthma, chronic, unspecified asthma severity, with acute exacerbation 09/09/2006  . DIVERTICULOSIS, COLON 09/09/2006  . Osteoarthritis 09/09/2006  . Spinal stenosis 09/09/2006  . COLONIC POLYPS, HX OF 09/09/2006    Orientation RESPIRATION BLADDER Height & Weight     Self, Time, Situation, Place  Normal, Other (Comment)(Was on bipap last night: 14/6 at 30%.) Incontinent Weight: 211 lb 13.8 oz (96.1 kg) Height:  (P) 5\' 6"  (167.6 cm)  BEHAVIORAL SYMPTOMS/MOOD NEUROLOGICAL BOWEL NUTRITION STATUS  (None) (None) Continent Diet(Heart healthy/carb modified)  AMBULATORY STATUS COMMUNICATION OF NEEDS Skin   Extensive Assist Verbally Skin abrasions, Bruising                       Personal Care Assistance Level of Assistance  Bathing, Feeding, Dressing Bathing Assistance: Limited assistance Feeding assistance: Limited assistance Dressing Assistance: Limited assistance     Functional Limitations Info  Sight, Hearing, Speech Sight Info: Adequate Hearing Info: Adequate Speech Info: Adequate    SPECIAL CARE FACTORS FREQUENCY  PT (By licensed PT), Blood pressure, OT (By licensed OT)     PT Frequency: 5 x week OT Frequency: 5 x week            Contractures Contractures Info: Not present    Additional Factors Info  Code Status, Allergies, Isolation Precautions, Psychotropic Code Status Info: DNR Allergies Info: Oxycodone Hcl, Morphine Sulfate, Sulfamethoxazole Psychotropic Info: Depression, anxiety:  Wellbutrin XL 300 PO BH-each morning, Lexapro 10 mg PO daily, Klonopin 0.5 mg PO QHS prn.   Isolation Precautions Info: Droplet: rule out respiratory panel.     Current Medications (03/15/2017):   This is the current hospital active medication list Current Facility-Administered Medications  Medication Dose Route Frequency Provider Last Rate Last Dose  . acetaminophen (TYLENOL) tablet 650 mg  650 mg Oral Q6H PRN Ivor Costa, MD      . albuterol (PROVENTIL) (2.5 MG/3ML) 0.083% nebulizer solution 2.5 mg  2.5 mg Nebulization Q4H PRN Ivor Costa, MD      . aspirin EC tablet 325 mg  325 mg Oral Daily Ivor Costa, MD   325 mg at 03/15/17 1221  . [START ON 03/16/2017] azithromycin (ZITHROMAX) tablet 250 mg  250 mg Oral Daily Ivor Costa, MD      . benzonatate (TESSALON) capsule 200 mg  200 mg Oral TID PRN Ivor Costa, MD      . buPROPion (WELLBUTRIN XL) 24 hr tablet 300 mg  300 mg Oral Jaye Beagle, Soledad Gerlach, MD      . calcium-vitamin D (OSCAL WITH D) 500-200 MG-UNIT per tablet 1 tablet  1 tablet Oral Daily Ivor Costa, MD   1 tablet at 03/15/17 0831  . clonazePAM (KLONOPIN) tablet 0.5 mg  0.5 mg Oral QHS PRN Debbe Odea, MD      . docusate sodium (COLACE) capsule 100 mg  100 mg Oral Daily Ivor Costa, MD   100 mg at 03/15/17 0831  . enoxaparin (LOVENOX) injection 40 mg  40 mg Subcutaneous Q24H Debbe Odea, MD   40 mg at 03/15/17 1522  . escitalopram (LEXAPRO) tablet 10 mg  10 mg Oral Daily Ivor Costa, MD   10 mg at 03/15/17 1223  . fluticasone (FLONASE) 50 MCG/ACT nasal spray 1 spray  1 spray Each Nare BID Ivor Costa, MD   1 spray at 03/15/17 1245  . gabapentin (NEURONTIN) capsule 300 mg  300 mg Oral TID Ivor Costa, MD   300 mg at 03/15/17 1522  . HYDROcodone-acetaminophen (NORCO/VICODIN) 5-325 MG per tablet 1 tablet  1 tablet Oral BID PRN Ivor Costa, MD   1 tablet at 03/15/17 1242  . ibuprofen (ADVIL,MOTRIN) tablet 200 mg  200 mg Oral Q12H PRN Ivor Costa, MD      . ipratropium-albuterol (DUONEB) 0.5-2.5 (3) MG/3ML nebulizer solution 3 mL  3 mL Nebulization Q6H PRN Debbe Odea, MD      . menthol-cetylpyridinium (CEPACOL) lozenge 3 mg  1 lozenge Oral Q4H PRN Ivor Costa, MD      . methocarbamol (ROBAXIN)  tablet 500 mg  500 mg Oral Q6H PRN Ivor Costa, MD      . methylPREDNISolone sodium succinate (SOLU-MEDROL) 40 mg/mL injection 40 mg  40 mg Intravenous Q12H Rizwan, Saima, MD      . metoprolol tartrate (LOPRESSOR) tablet 12.5 mg  12.5 mg Oral BID Ivor Costa, MD   12.5 mg at 03/15/17 1227  . miconazole (MICOTIN) 2 % cream   Topical BID Ivor Costa, MD      . mirabegron ER [facility]) tablet 50 mg  50 mg Oral Daily Ivor Costa, MD   50 mg at 03/15/17 6270  . mometasone-formoterol (DULERA) 200-5 MCG/ACT inhaler 1 puff  1 puff Inhalation BID Ivor Costa, MD      . montelukast (SINGULAIR) tablet 10 mg  10 mg Oral QHS Ivor Costa, MD      . mupirocin ointment (BACTROBAN) 2 %   Topical Daily Rizwan,  Saima, MD      . polyethylene glycol (MIRALAX / GLYCOLAX) packet 17 g  17 g Oral Daily PRN Ivor Costa, MD      . polyvinyl alcohol (LIQUIFILM TEARS) 1.4 % ophthalmic solution 1 drop  1 drop Both Eyes BID Ivor Costa, MD   1 drop at 03/15/17 1245  . senna (SENOKOT) tablet 8.6 mg  1 tablet Oral Daily PRN Ivor Costa, MD      . sodium phosphate (FLEET) 7-19 GM/118ML enema 1 enema  1 enema Rectal Daily PRN Ivor Costa, MD         Discharge Medications: Please see discharge summary for a list of discharge medications.  Relevant Imaging Results:  Relevant Lab Results:   Additional Information SS#: [id number]. From [facility].  Candie Chroman, LCSW

## 2017-03-15 NOTE — Progress Notes (Signed)
RT at bed side to check on pt since she was new to the floor. Pt seemed drowsy and confused.  RT talked to RN, who had already paged NP. PT was placed on Bipap.  Pt seems comfortable and not fighting Bipap at this time.

## 2017-03-15 NOTE — Consult Note (Signed)
[facility] Nurse wound consult note Reason for Consult:trauma wound to right forehead from fall at home.  Two sutures placed in ED.  Edges are well approximated.  Dried blood noted in hair and discussed shampooing once more stable (respiratory).  BiPAP in place at this time.  WIll add mupirocin ointment and foam dressing for padding due to BiPAP application Wound type:trauma Pressure Injury POA: NA Measurement: 2.4 cm laceration with 2 sutures intact Wound OIN:OMVEH approximated Drainage (amount, consistency, odor) none Periwound:edema from trauma/fall Dressing procedure/placement/frequency:mupirocin ointment to suture line daily.  Pad with silicone border foam dressing.  Will not follow at this time.  Please re-consult if needed.  Domenic Moras RN BSN [place] Pager [phone]

## 2017-03-15 NOTE — Progress Notes (Signed)
OT Cancellation Note  Patient Details Name: [name] MRN: [id number] DOB: 09/04/1930   Cancelled Treatment:    Reason Eval/Treat Not Completed: Patient not medically ready. Pt on BiPap and very lethargic. Nursing requested to hold today and check on patient for readiness tomorrow.   [place], OT/L  [phone] 03/15/2017 03/15/2017, 10:06 AM

## 2017-03-15 NOTE — Progress Notes (Signed)
Patient resting comfortably on room air with no respiratory distress noted. BIPAP not needed at this time. RT will monitor as needed. 

## 2017-03-16 ENCOUNTER — Other Ambulatory Visit: Payer: Self-pay

## 2017-03-16 ENCOUNTER — Encounter (HOSPITAL_COMMUNITY): Payer: Self-pay

## 2017-03-16 DIAGNOSIS — S0181XA Laceration without foreign body of other part of head, initial encounter: Secondary | ICD-10-CM | POA: Diagnosis not present

## 2017-03-16 DIAGNOSIS — W19XXXD Unspecified fall, subsequent encounter: Secondary | ICD-10-CM

## 2017-03-16 DIAGNOSIS — I10 Essential (primary) hypertension: Secondary | ICD-10-CM | POA: Diagnosis not present

## 2017-03-16 DIAGNOSIS — J45901 Unspecified asthma with (acute) exacerbation: Secondary | ICD-10-CM

## 2017-03-16 DIAGNOSIS — J9621 Acute and chronic respiratory failure with hypoxia: Secondary | ICD-10-CM

## 2017-03-16 DIAGNOSIS — J441 Chronic obstructive pulmonary disease with (acute) exacerbation: Secondary | ICD-10-CM | POA: Diagnosis not present

## 2017-03-16 DIAGNOSIS — R0602 Shortness of breath: Secondary | ICD-10-CM | POA: Diagnosis not present

## 2017-03-16 LAB — BASIC METABOLIC PANEL
ANION GAP: 9 (ref 5–15)
BUN: 15 mg/dL (ref 6–20)
CHLORIDE: 102 mmol/L (ref 101–111)
CO2: 29 mmol/L (ref 22–32)
Calcium: 9.2 mg/dL (ref 8.9–10.3)
Creatinine, Ser: 0.55 mg/dL (ref 0.44–1.00)
GFR calc non Af Amer: >60 mL/min (ref >60)
Glucose, Bld: 129 mg/dL — ABNORMAL HIGH (ref 65–99)
Potassium: 4.3 mmol/L (ref 3.5–5.1)
Sodium: 140 mmol/L (ref 135–145)

## 2017-03-16 LAB — CBC
HEMATOCRIT: 38.5 % (ref 36.0–46.0)
HEMOGLOBIN: 12.6 g/dL (ref 12.0–15.0)
MCH: 31.9 pg (ref 26.0–34.0)
MCHC: 32.7 g/dL (ref 30.0–36.0)
MCV: 97.5 fL (ref 78.0–100.0)
Platelets: 216 10*3/uL (ref 150–400)
RBC: 3.95 MIL/uL (ref 3.87–5.11)
RDW: 13.8 % (ref 11.5–15.5)
WBC: 11.3 10*3/uL — ABNORMAL HIGH (ref 4.0–10.5)

## 2017-03-16 MED ORDER — AZITHROMYCIN 250 MG PO TABS: 250.0000 mg | ORAL_TABLET | Freq: Every day | ORAL | 0 refills | Status: DC

## 2017-03-16 MED ORDER — HYDROCODONE-ACETAMINOPHEN 5-325 MG PO TABS: 1.0000 | ORAL_TABLET | Freq: Two times a day (BID) | ORAL | 0 refills | Status: DC | PRN

## 2017-03-16 MED ORDER — PREDNISONE 10 MG PO TABS: ORAL_TABLET | ORAL | 0 refills | Status: DC

## 2017-03-16 MED ORDER — IPRATROPIUM-ALBUTEROL 0.5-2.5 (3) MG/3ML IN SOLN: 3.0000 mL | Freq: Four times a day (QID) | RESPIRATORY_TRACT | 0 refills | Status: DC | PRN

## 2017-03-16 NOTE — Progress Notes (Signed)
Removed PIV access on Lt. Arm. Rt. Arm restrictions. Gave the report to [facility] RN [place] regarding patient. Patient understood that she is going to be transferred to [facility] and talked to her son. Awaiting for transfer. HS [employer]

## 2017-03-16 NOTE — Clinical Social Work Note (Addendum)
[facility] has agreed to take the patient with a plan to transition to long-term care after rehab. They will also start working on taking her husband under long-term care once a bed is available. Patient's son notified and will have his sister go to the facility today after 2:00 pm to go ahead and complete paperwork.  Dayton Scrape, [place] [phone]  12:14 pm CSW left message for admissions coordinator at [facility] asking her to start insurance authorization. CSW left voicemail for patient's daughter.  Dayton Scrape, [place]

## 2017-03-16 NOTE — Clinical Social Work Placement (Signed)
   CLINICAL SOCIAL WORK PLACEMENT  NOTE  Date:  03/16/2017  Patient Details  Name: [name] MRN: [id number] Date of Birth: Feb 10, 1931  Clinical Social Work is seeking post-discharge placement for this patient at the [place] level of care (*CSW will initial, date and re-position this form in  chart as items are completed):  Yes   Patient/family provided with [facility] Work Department's list of facilities offering this level of care within the geographic area requested by the patient (or if unable, by the patient's family).  Yes   Patient/family informed of their freedom to choose among providers that offer the needed level of care, that participate in Medicare, Medicaid or managed care program needed by the patient, have an available bed and are willing to accept the patient.  Yes   Patient/family informed of [facility]'s ownership interest in [facility] and [facility], as well as of the fact that they are under no obligation to receive care at these facilities.  PASRR submitted to EDS on 03/15/17     PASRR number received on       Existing PASRR number confirmed on 03/15/17     FL2 transmitted to all facilities in geographic area requested by pt/family on 03/15/17     FL2 transmitted to all facilities within larger geographic area on       Patient informed that his/her managed care company has contracts with or will negotiate with certain facilities, including the following:        Yes   Patient/family informed of bed offers received.  Patient chooses bed at [facility]     Physician recommends and patient chooses bed at      Patient to be transferred to [facility] on 03/16/17.  Patient to be transferred to facility by [facility]     Patient family notified on 03/16/17 of transfer.  Name of family member notified:  [name] and [name]       Additional Comment:     _______________________________________________ Candie Chroman, LCSW 03/16/2017, 3:28 PM

## 2017-03-16 NOTE — Clinical Social Work Note (Signed)
CSW facilitated patient discharge including contacting patient family and facility to confirm patient discharge plans. Clinical information faxed to facility and family agreeable with plan. CSW arranged ambulance transport via [facility] to [facility]. RN to call report prior to discharge ([phone]).  CSW will sign off for now as social work intervention is no longer needed. Please consult us again if new needs arise.  Jeanean Hollett, CSW [phone]   

## 2017-03-16 NOTE — Discharge Summary (Signed)
Physician Discharge Summary  [name] TGG:[id number] DOB: 05-30-1930 DOA: 03/14/2017  PCP: Reynold Bowen, MD  Admit date: 03/14/2017 Discharge date: 03/16/2017  Admitted From: ALF   Disposition:  SNF    Discharge Condition:  stable CODE STATUS:  DNR   Consultations:  none    Discharge Diagnoses:  Principal Problem:  Acute on chronic respiratory failure with hypoxia   COPD with acute exacerbation   Asthma exacerbation Active Problems:   Essential hypertension   Spinal stenosis   Fall   Depression with anxiety    Subjective: Mild cough today. No dyspnea. No other complaints  Brief Summary: [name] is a 84 y.o.femalewith medical history significant ofasthma, arthritis, COPD, frequent UTIs, hypertension, spinal stenosis,diet-controlled diabetes, hypertension, depression with anxiety, prostate cancer (s/p ofright lumpectomy), whopresents with fall and shortness of breath. The patient declined to go on BiPAP in the ED. She was later found to be lethargic and an ABG showed metabolic acidosis with CO2 retention. She was placed on BiPAP and transferred to SDU.    Hospital Course:  Principal Problem:   COPD with acute exacerbation- Asthma exacerbation  Acute on chronic respiratory failure with hypercarbia -  improved with BiPAP yesterday and remained off of BiPAP since- she has not required any O2 either - Influenza negative, resp virus panel negative -  strep pneumo negative-  - cont nebs, Dulera, Singulair and Z pak - weaned Solumedrol to Prednisone today -  Symptoms are quite improved- she is not wheezing- cough is mild and there is no dyspnea- she is stable for discharge to SNF today  Active Problems: Diarrhea -  appears improved- tolerating a regular diet    Essential hypertension - cont Metoprolol  - hold HCTZ    Fall - PT eval    Depression with anxiety  - Lexapro     Discharge Exam: Vitals:   03/16/17 0758 03/16/17 0802  BP:   127/68  Pulse:    Resp:    Temp:  98.6 F (37 C)  SpO2: 99%    Vitals:   03/15/17 2315 03/16/17 0356 03/16/17 0758 03/16/17 0802  BP: (!) 107/53 (!) 113/59  127/68  Pulse: 83 61    Resp: 17 (!) 23    Temp: 98.8 F (37.1 C) 98.6 F (37 C)  98.6 F (37 C)  TempSrc: Oral Oral  Oral  SpO2: 94% 93% 99%   Weight:      Height:        General: Pt is alert, awake, not in acute distress Cardiovascular: RRR, S1/S2 +, no rubs, no gallops Respiratory: CTA bilaterally, no wheezing, no rhonchi Abdominal: Soft, NT, ND, bowel sounds + Extremities: no edema, no cyanosis   Discharge Instructions  Discharge Instructions    Diet - low sodium heart healthy   Complete by:  As directed    Increase activity slowly   Complete by:  As directed      Allergies as of 03/16/2017      Reactions   Oxycodone Hcl [oxycodone Hcl] Rash   Morphine Sulfate Other (See Comments)   REACTION: very anxious   Sulfamethoxazole Other (See Comments)   REACTION: unspecified      Medication List    STOP taking these medications   hydrochlorothiazide 12.5 MG capsule Commonly known as:  MICROZIDE     TAKE these medications   acetaminophen 650 MG CR tablet Commonly known as:  TYLENOL Take 650-1,300 mg by mouth every 12 (twelve) hours as needed for pain.  acetaminophen 325 MG tablet Commonly known as:  TYLENOL Take 2 tablets (650 mg total) by mouth every 6 (six) hours as needed for mild pain (or Fever >/= 101).   ADVIL 200 MG tablet Generic drug:  ibuprofen Take 200 mg by mouth every 12 (twelve) hours as needed (pain).   aspirin EC 325 MG tablet Take 325 mg by mouth daily.   azithromycin 250 MG tablet Commonly known as:  ZITHROMAX Take 1 tablet (250 mg total) by mouth daily.   benzonatate 200 MG capsule Commonly known as:  TESSALON Take 200 mg by mouth 3 (three) times daily as needed for cough.   buPROPion 300 MG 24 hr tablet Commonly known as:  WELLBUTRIN XL Take 300 mg by mouth every  morning.   CALCIUM+D3 600-800 MG-UNIT Tabs Generic drug:  Calcium Carb-Cholecalciferol Take 1 tablet by mouth daily.   CEPACOL SORE THROAT 15-2.6 MG Lozg Generic drug:  Benzocaine-Menthol Use as directed 1 lozenge in the mouth or throat every 4 (four) hours as needed (for sore throat).   ciclopirox 8 % solution Commonly known as:  PENLAC Apply topically daily. Over nail and surrounding skin   clonazePAM 1 MG tablet Commonly known as:  KLONOPIN Take 1 tablet (1 mg total) by mouth at bedtime as needed (insomnia). What changed:  reasons to take this   docusate sodium 100 MG capsule Commonly known as:  COLACE Take 100 mg by mouth daily.   DULERA 200-5 MCG/ACT Aero Generic drug:  mometasone-formoterol Inhale 1 puff into the lungs 2 (two) times daily.   escitalopram 10 MG tablet Commonly known as:  LEXAPRO Take 10 mg by mouth daily.   fluticasone 50 MCG/ACT nasal spray Commonly known as:  FLONASE Place 1 spray into both nostrils 2 (two) times daily.   gabapentin 300 MG capsule Commonly known as:  NEURONTIN TAKE ONE CAPSULE THREE TIMES DAILY   HYDROcodone-acetaminophen 5-325 MG tablet Commonly known as:  NORCO/VICODIN Take 1 tablet by mouth 2 (two) times daily as needed for moderate pain.   ipratropium-albuterol 0.5-2.5 (3) MG/3ML Soln Commonly known as:  DUONEB Take 3 mLs by nebulization every 6 (six) hours as needed.   methocarbamol 500 MG tablet Commonly known as:  ROBAXIN Take 1 tablet (500 mg total) by mouth every 6 (six) hours as needed for muscle spasms.   metoprolol tartrate 25 MG tablet Commonly known as:  LOPRESSOR Take 0.5 tablets (12.5 mg total) by mouth 2 (two) times daily.   miconazole 2 % powder Commonly known as:  MICOTIN Apply topically 2 (two) times daily. To area under breasts and to bilateral groin after miconazole cream   montelukast 10 MG tablet Commonly known as:  SINGULAIR Take 10 mg by mouth at bedtime.   MYRBETRIQ 50 MG Tb24  tablet Generic drug:  mirabegron ER Take 50 mg by mouth daily.   ondansetron 4 MG tablet Commonly known as:  ZOFRAN Take 4 mg by mouth every 8 (eight) hours as needed for nausea or vomiting.   polyethylene glycol packet Commonly known as:  MIRALAX / GLYCOLAX Take 17 g by mouth daily as needed for mild constipation.   predniSONE 10 MG tablet Commonly known as:  DELTASONE Take 60 mg tomorrow, 40 mg for 2 days, 20 mg for 2 days and 10 mg the last 2 days   PROAIR HFA 108 (90 Base) MCG/ACT inhaler Generic drug:  albuterol Inhale 2 puffs into the lungs every 4 (four) hours as needed for shortness of breath.  Propylene Glycol-Glycerin 0.6-0.6 % Soln Commonly known as:  SOOTHE Apply 1 drop to eye 2 (two) times daily.   senna 8.6 MG Tabs tablet Commonly known as:  SENOKOT Take 1 tablet by mouth daily as needed for mild constipation.   sodium phosphate 7-19 GM/118ML Enem Place 1 enema rectally daily as needed for severe constipation.       Allergies  Allergen Reactions  . Oxycodone Hcl [Oxycodone Hcl] Rash  . Morphine Sulfate Other (See Comments)    REACTION: very anxious  . Sulfamethoxazole Other (See Comments)    REACTION: unspecified     Procedures/Studies:    Ct Head Wo Contrast  Result Date: 03/14/2017 CLINICAL DATA:  Fall tonight. EXAM: CT HEAD WITHOUT CONTRAST CT CERVICAL SPINE WITHOUT CONTRAST TECHNIQUE: Multidetector CT imaging of the head and cervical spine was performed following the standard protocol without intravenous contrast. Multiplanar CT image reconstructions of the cervical spine were also generated. COMPARISON:  11/28/2015 FINDINGS: CT HEAD FINDINGS Brain: Mild cerebral atrophy. Ventricular dilatation likely due to central atrophy. Low-attenuation changes in the periventricular white matter consistent small vessel ischemia. No mass effect or midline shift. No abnormal extra-axial fluid collections. Gray-white matter junctions are distinct. Basal cisterns  are not effaced. No acute intracranial hemorrhage. Vascular: Intracranial arterial vascular calcifications are present. Skull: Calvarium appears intact. Sinuses/Orbits: Paranasal sinuses and mastoid air cells are not opacified. Other: Small subcutaneous scalp hematoma over the right anterior frontal region. Probable small laceration. CT CERVICAL SPINE FINDINGS Examination is technically limited due to motion artifact. Alignment: Straightening of the usual cervical lordosis is likely postoperative. No anterior subluxation. Normal alignment of the facet joints. Skull base and vertebrae: Skull base appears intact. Severe degenerative changes at C1 to with mild compression of the left lateral mass of C1, likely chronic diffuse bone demineralization. Postoperative changes with anterior plate and screw fixation and intervertebral fusion from C4 through C7. Fusion of the posterior elements with cerclage wire. Few segments appear intact. No destructive or expansile bone lesions. Soft tissues and spinal canal: No prevertebral soft tissue swelling. No paraspinal soft tissue mass or infiltration. Disc levels: Fusion from C3 through C7 as discussed. Degenerative changes at C1 to, C2-3, and C7-T1 levels. Upper chest: Scarring in the right lung apex. Other: None. IMPRESSION: 1. No acute intracranial abnormalities. Chronic atrophy and small vessel ischemic changes. 2. Postoperative anterior fixation and intervertebral fusion from C4 through C7. Posterior fusion with cerclage wire. Fused segments appear intact. Straightening of the usual cervical lordosis is likely postoperative. Severe degenerative changes at C1 to. No acute displaced fractures identified. Electronically Signed   By: Lucienne Capers M.D.   On: 03/14/2017 23:44   Ct Cervical Spine Wo Contrast  Result Date: 03/14/2017 CLINICAL DATA:  Fall tonight. EXAM: CT HEAD WITHOUT CONTRAST CT CERVICAL SPINE WITHOUT CONTRAST TECHNIQUE: Multidetector CT imaging of the head  and cervical spine was performed following the standard protocol without intravenous contrast. Multiplanar CT image reconstructions of the cervical spine were also generated. COMPARISON:  11/28/2015 FINDINGS: CT HEAD FINDINGS Brain: Mild cerebral atrophy. Ventricular dilatation likely due to central atrophy. Low-attenuation changes in the periventricular white matter consistent small vessel ischemia. No mass effect or midline shift. No abnormal extra-axial fluid collections. Gray-white matter junctions are distinct. Basal cisterns are not effaced. No acute intracranial hemorrhage. Vascular: Intracranial arterial vascular calcifications are present. Skull: Calvarium appears intact. Sinuses/Orbits: Paranasal sinuses and mastoid air cells are not opacified. Other: Small subcutaneous scalp hematoma over the right anterior frontal region. Probable  small laceration. CT CERVICAL SPINE FINDINGS Examination is technically limited due to motion artifact. Alignment: Straightening of the usual cervical lordosis is likely postoperative. No anterior subluxation. Normal alignment of the facet joints. Skull base and vertebrae: Skull base appears intact. Severe degenerative changes at C1 to with mild compression of the left lateral mass of C1, likely chronic diffuse bone demineralization. Postoperative changes with anterior plate and screw fixation and intervertebral fusion from C4 through C7. Fusion of the posterior elements with cerclage wire. Few segments appear intact. No destructive or expansile bone lesions. Soft tissues and spinal canal: No prevertebral soft tissue swelling. No paraspinal soft tissue mass or infiltration. Disc levels: Fusion from C3 through C7 as discussed. Degenerative changes at C1 to, C2-3, and C7-T1 levels. Upper chest: Scarring in the right lung apex. Other: None. IMPRESSION: 1. No acute intracranial abnormalities. Chronic atrophy and small vessel ischemic changes. 2. Postoperative anterior fixation and  intervertebral fusion from C4 through C7. Posterior fusion with cerclage wire. Fused segments appear intact. Straightening of the usual cervical lordosis is likely postoperative. Severe degenerative changes at C1 to. No acute displaced fractures identified. Electronically Signed   By: Lucienne Capers M.D.   On: 03/14/2017 23:44   Dg Chest Port 1 View  Result Date: 03/14/2017 CLINICAL DATA:  Shortness of breath, fall. EXAM: PORTABLE CHEST 1 VIEW COMPARISON:  Chest x-ray dated 11/09/2016. FINDINGS: Heart size and mediastinal contours are within normal limits. Lungs are clear. No pleural effusion or pneumothorax seen. Osseous structures about the chest are unremarkable. IMPRESSION: No active disease. Electronically Signed   By: Franki Cabot M.D.   On: 03/14/2017 23:38     The results of significant diagnostics from this hospitalization (including imaging, microbiology, ancillary and laboratory) are listed below for reference.     Microbiology: Recent Results (from the past 240 hour(s))  Respiratory Panel by PCR     Status: None   Collection Time: 03/15/17  2:28 AM  Result Value Ref Range Status   Adenovirus NOT DETECTED NOT DETECTED Final   Coronavirus 229E NOT DETECTED NOT DETECTED Final   Coronavirus HKU1 NOT DETECTED NOT DETECTED Final   Coronavirus NL63 NOT DETECTED NOT DETECTED Final   Coronavirus OC43 NOT DETECTED NOT DETECTED Final   Metapneumovirus NOT DETECTED NOT DETECTED Final   Rhinovirus / Enterovirus NOT DETECTED NOT DETECTED Final   Influenza A NOT DETECTED NOT DETECTED Final   Influenza B NOT DETECTED NOT DETECTED Final   Parainfluenza Virus 1 NOT DETECTED NOT DETECTED Final   Parainfluenza Virus 2 NOT DETECTED NOT DETECTED Final   Parainfluenza Virus 3 NOT DETECTED NOT DETECTED Final   Parainfluenza Virus 4 NOT DETECTED NOT DETECTED Final   Respiratory Syncytial Virus NOT DETECTED NOT DETECTED Final   Bordetella pertussis NOT DETECTED NOT DETECTED Final   Chlamydophila  pneumoniae NOT DETECTED NOT DETECTED Final   Mycoplasma pneumoniae NOT DETECTED NOT DETECTED Final  Culture, blood (routine x 2) Call MD if unable to obtain prior to antibiotics being given     Status: None (Preliminary result)   Collection Time: 03/15/17  3:48 AM  Result Value Ref Range Status   Specimen Description BLOOD LEFT FOREARM  Final   Special Requests IN PEDIATRIC BOTTLE Blood Culture adequate volume  Final   Culture NO GROWTH 1 DAY  Final   Report Status PENDING  Incomplete  Culture, blood (routine x 2) Call MD if unable to obtain prior to antibiotics being given     Status: None (Preliminary  result)   Collection Time: 03/15/17  3:50 AM  Result Value Ref Range Status   Specimen Description BLOOD LEFT ANTECUBITAL  Final   Special Requests IN PEDIATRIC BOTTLE Blood Culture adequate volume  Final   Culture NO GROWTH 1 DAY  Final   Report Status PENDING  Incomplete  MRSA PCR Screening     Status: None   Collection Time: 03/15/17  1:30 PM  Result Value Ref Range Status   MRSA by PCR NEGATIVE NEGATIVE Final    Comment:        The GeneXpert MRSA Assay (FDA approved for NASAL specimens only), is one component of a comprehensive MRSA colonization surveillance program. It is not intended to diagnose MRSA infection nor to guide or monitor treatment for MRSA infections.      Labs: BNP (last 3 results) Recent Labs    07/07/16 0844 11/08/16 0855  BNP 103.1* 18.5   Basic Metabolic Panel: Recent Labs  Lab 03/15/17 0210 03/15/17 1324 03/16/17 0333  NA 139 137 140  K 3.7 4.3 4.3  CL 99* 99* 102  CO2 26 23 29   GLUCOSE 185* 187* 129*  BUN 15 15 15   CREATININE 0.67 0.67 0.55  CALCIUM 9.4 9.5 9.2   Liver Function Tests: Recent Labs  Lab 03/15/17 0210  AST 23  ALT 15  ALKPHOS 115  BILITOT 0.4  PROT 6.8  ALBUMIN 3.4*   No results for input(s): LIPASE, AMYLASE in the last 168 hours. No results for input(s): AMMONIA in the last 168 hours. CBC: Recent Labs  Lab  03/15/17 0210 03/15/17 1324 03/16/17 0333  WBC 9.0 6.3 11.3*  NEUTROABS 8.5*  --   --   HGB 14.9 14.3 12.6  HCT 46.4* 42.9 38.5  MCV 98.9 96.4 97.5  PLT 178 198 216   Cardiac Enzymes: No results for input(s): CKTOTAL, CKMB, CKMBINDEX, TROPONINI in the last 168 hours. BNP: Invalid input(s): POCBNP CBG: Recent Labs  Lab 03/15/17 0312  GLUCAP 187*   D-Dimer No results for input(s): DDIMER in the last 72 hours. Hgb A1c No results for input(s): HGBA1C in the last 72 hours. Lipid Profile No results for input(s): CHOL, HDL, LDLCALC, TRIG, CHOLHDL, LDLDIRECT in the last 72 hours. Thyroid function studies No results for input(s): TSH, T4TOTAL, T3FREE, THYROIDAB in the last 72 hours.  Invalid input(s): FREET3 Anemia work up No results for input(s): VITAMINB12, FOLATE, FERRITIN, TIBC, IRON, RETICCTPCT in the last 72 hours. Urinalysis    Component Value Date/Time   COLORURINE YELLOW 03/14/2017 2312   APPEARANCEUR CLEAR 03/14/2017 2312   LABSPEC 1.028 03/14/2017 2312   PHURINE 5.0 03/14/2017 2312   GLUCOSEU NEGATIVE 03/14/2017 2312   HGBUR NEGATIVE 03/14/2017 2312   BILIRUBINUR NEGATIVE 03/14/2017 2312   BILIRUBINUR n 09/25/2010 1525   KETONESUR NEGATIVE 03/14/2017 2312   PROTEINUR 30 (A) 03/14/2017 2312   UROBILINOGEN 0.2 09/25/2010 1525   UROBILINOGEN 0.2 01/04/2007 0900   NITRITE NEGATIVE 03/14/2017 2312   LEUKOCYTESUR NEGATIVE 03/14/2017 2312   Sepsis Labs Invalid input(s): PROCALCITONIN,  WBC,  LACTICIDVEN Microbiology Recent Results (from the past 240 hour(s))  Respiratory Panel by PCR     Status: None   Collection Time: 03/15/17  2:28 AM  Result Value Ref Range Status   Adenovirus NOT DETECTED NOT DETECTED Final   Coronavirus 229E NOT DETECTED NOT DETECTED Final   Coronavirus HKU1 NOT DETECTED NOT DETECTED Final   Coronavirus NL63 NOT DETECTED NOT DETECTED Final   Coronavirus OC43 NOT DETECTED NOT DETECTED Final  Metapneumovirus NOT DETECTED NOT DETECTED  Final   Rhinovirus / Enterovirus NOT DETECTED NOT DETECTED Final   Influenza A NOT DETECTED NOT DETECTED Final   Influenza B NOT DETECTED NOT DETECTED Final   Parainfluenza Virus 1 NOT DETECTED NOT DETECTED Final   Parainfluenza Virus 2 NOT DETECTED NOT DETECTED Final   Parainfluenza Virus 3 NOT DETECTED NOT DETECTED Final   Parainfluenza Virus 4 NOT DETECTED NOT DETECTED Final   Respiratory Syncytial Virus NOT DETECTED NOT DETECTED Final   Bordetella pertussis NOT DETECTED NOT DETECTED Final   Chlamydophila pneumoniae NOT DETECTED NOT DETECTED Final   Mycoplasma pneumoniae NOT DETECTED NOT DETECTED Final  Culture, blood (routine x 2) Call MD if unable to obtain prior to antibiotics being given     Status: None (Preliminary result)   Collection Time: 03/15/17  3:48 AM  Result Value Ref Range Status   Specimen Description BLOOD LEFT FOREARM  Final   Special Requests IN PEDIATRIC BOTTLE Blood Culture adequate volume  Final   Culture NO GROWTH 1 DAY  Final   Report Status PENDING  Incomplete  Culture, blood (routine x 2) Call MD if unable to obtain prior to antibiotics being given     Status: None (Preliminary result)   Collection Time: 03/15/17  3:50 AM  Result Value Ref Range Status   Specimen Description BLOOD LEFT ANTECUBITAL  Final   Special Requests IN PEDIATRIC BOTTLE Blood Culture adequate volume  Final   Culture NO GROWTH 1 DAY  Final   Report Status PENDING  Incomplete  MRSA PCR Screening     Status: None   Collection Time: 03/15/17  1:30 PM  Result Value Ref Range Status   MRSA by PCR NEGATIVE NEGATIVE Final    Comment:        The GeneXpert MRSA Assay (FDA approved for NASAL specimens only), is one component of a comprehensive MRSA colonization surveillance program. It is not intended to diagnose MRSA infection nor to guide or monitor treatment for MRSA infections.      Time coordinating discharge: Over 30 minutes  SIGNED:   Debbe Odea, MD  [facility] 03/16/2017, 11:53 AM Pager   If 7PM-7AM, please contact night-coverage [email or web address] Password [email or web address]

## 2017-03-20 LAB — CULTURE, BLOOD (ROUTINE X 2)
CULTURE: NO GROWTH
Culture: NO GROWTH
SPECIAL REQUESTS: ADEQUATE
Special Requests: ADEQUATE

## 2017-03-22 ENCOUNTER — Emergency Department (HOSPITAL_COMMUNITY): Payer: Medicare Other

## 2017-03-22 ENCOUNTER — Inpatient Hospital Stay (HOSPITAL_COMMUNITY)
Admission: EM | Admit: 2017-03-22 | Discharge: 2017-03-24 | DRG: 189 | Disposition: A | Discharge status: Skilled Nursing Facility | Payer: Medicare Other | Attending: Internal Medicine | Admitting: Internal Medicine

## 2017-03-22 ENCOUNTER — Encounter (HOSPITAL_COMMUNITY): Payer: Self-pay | Admitting: *Deleted

## 2017-03-22 DIAGNOSIS — J440 Chronic obstructive pulmonary disease with acute lower respiratory infection: Secondary | ICD-10-CM | POA: Diagnosis present

## 2017-03-22 DIAGNOSIS — J441 Chronic obstructive pulmonary disease with (acute) exacerbation: Secondary | ICD-10-CM | POA: Diagnosis present

## 2017-03-22 DIAGNOSIS — Z7951 Long term (current) use of inhaled steroids: Secondary | ICD-10-CM | POA: Diagnosis not present

## 2017-03-22 DIAGNOSIS — Z79899 Other long term (current) drug therapy: Secondary | ICD-10-CM | POA: Diagnosis not present

## 2017-03-22 DIAGNOSIS — Z882 Allergy status to sulfonamides status: Secondary | ICD-10-CM | POA: Diagnosis not present

## 2017-03-22 DIAGNOSIS — Z7982 Long term (current) use of aspirin: Secondary | ICD-10-CM | POA: Diagnosis not present

## 2017-03-22 DIAGNOSIS — M4800 Spinal stenosis, site unspecified: Secondary | ICD-10-CM | POA: Diagnosis present

## 2017-03-22 DIAGNOSIS — Z96641 Presence of right artificial hip joint: Secondary | ICD-10-CM | POA: Diagnosis present

## 2017-03-22 DIAGNOSIS — F418 Other specified anxiety disorders: Secondary | ICD-10-CM | POA: Diagnosis not present

## 2017-03-22 DIAGNOSIS — M1990 Unspecified osteoarthritis, unspecified site: Secondary | ICD-10-CM | POA: Diagnosis present

## 2017-03-22 DIAGNOSIS — J209 Acute bronchitis, unspecified: Secondary | ICD-10-CM | POA: Diagnosis present

## 2017-03-22 DIAGNOSIS — I10 Essential (primary) hypertension: Secondary | ICD-10-CM | POA: Diagnosis present

## 2017-03-22 DIAGNOSIS — J9621 Acute and chronic respiratory failure with hypoxia: Principal | ICD-10-CM | POA: Diagnosis present

## 2017-03-22 DIAGNOSIS — Z87891 Personal history of nicotine dependence: Secondary | ICD-10-CM | POA: Diagnosis not present

## 2017-03-22 DIAGNOSIS — Z96653 Presence of artificial knee joint, bilateral: Secondary | ICD-10-CM | POA: Diagnosis present

## 2017-03-22 DIAGNOSIS — Z885 Allergy status to narcotic agent status: Secondary | ICD-10-CM | POA: Diagnosis not present

## 2017-03-22 DIAGNOSIS — Z981 Arthrodesis status: Secondary | ICD-10-CM | POA: Diagnosis not present

## 2017-03-22 DIAGNOSIS — F329 Major depressive disorder, single episode, unspecified: Secondary | ICD-10-CM | POA: Diagnosis present

## 2017-03-22 DIAGNOSIS — Z66 Do not resuscitate: Secondary | ICD-10-CM | POA: Diagnosis present

## 2017-03-22 DIAGNOSIS — M8580 Other specified disorders of bone density and structure, unspecified site: Secondary | ICD-10-CM | POA: Diagnosis present

## 2017-03-22 DIAGNOSIS — F419 Anxiety disorder, unspecified: Secondary | ICD-10-CM | POA: Diagnosis present

## 2017-03-22 DIAGNOSIS — E119 Type 2 diabetes mellitus without complications: Secondary | ICD-10-CM | POA: Diagnosis present

## 2017-03-22 HISTORY — DX: Prediabetes: R73.03

## 2017-03-22 HISTORY — DX: Unspecified fall, initial encounter: W19.XXXA

## 2017-03-22 LAB — URINALYSIS, ROUTINE W REFLEX MICROSCOPIC
Bilirubin Urine: NEGATIVE
Glucose, UA: NEGATIVE mg/dL
Hgb urine dipstick: NEGATIVE
Ketones, ur: NEGATIVE mg/dL
Leukocytes, UA: NEGATIVE
Nitrite: NEGATIVE
Protein, ur: 30 mg/dL — AB
Specific Gravity, Urine: 1.018 (ref 1.005–1.030)
pH: 5 (ref 5.0–8.0)

## 2017-03-22 LAB — INFLUENZA PANEL BY PCR (TYPE A & B)
Influenza A By PCR: NEGATIVE
Influenza B By PCR: NEGATIVE

## 2017-03-22 LAB — CBC WITH DIFFERENTIAL/PLATELET
Basophils Absolute: 0 10*3/uL (ref 0.0–0.1)
Basophils Relative: 0 %
Eosinophils Absolute: 0 10*3/uL (ref 0.0–0.7)
Eosinophils Relative: 0 %
HCT: 47.6 % — ABNORMAL HIGH (ref 36.0–46.0)
Hemoglobin: 15.5 g/dL — ABNORMAL HIGH (ref 12.0–15.0)
Lymphocytes Relative: 7 %
Lymphs Abs: 0.5 10*3/uL — ABNORMAL LOW (ref 0.7–4.0)
MCH: 32.1 pg (ref 26.0–34.0)
MCHC: 32.6 g/dL (ref 30.0–36.0)
MCV: 98.6 fL (ref 78.0–100.0)
Monocytes Absolute: 0.1 10*3/uL (ref 0.1–1.0)
Monocytes Relative: 2 %
Neutro Abs: 6.8 10*3/uL (ref 1.7–7.7)
Neutrophils Relative %: 91 %
Platelets: 154 10*3/uL (ref 150–400)
RBC: 4.83 MIL/uL (ref 3.87–5.11)
RDW: 14.1 % (ref 11.5–15.5)
WBC: 7.5 10*3/uL (ref 4.0–10.5)

## 2017-03-22 LAB — BASIC METABOLIC PANEL
Anion gap: 12 (ref 5–15)
BUN: 9 mg/dL (ref 6–20)
CO2: 28 mmol/L (ref 22–32)
Calcium: 9 mg/dL (ref 8.9–10.3)
Chloride: 98 mmol/L — ABNORMAL LOW (ref 101–111)
Creatinine, Ser: 0.72 mg/dL (ref 0.44–1.00)
GFR calc Af Amer: >60 mL/min (ref >60)
GFR calc non Af Amer: >60 mL/min (ref >60)
Glucose, Bld: 142 mg/dL — ABNORMAL HIGH (ref 65–99)
Potassium: 3.8 mmol/L (ref 3.5–5.1)
Sodium: 138 mmol/L (ref 135–145)

## 2017-03-22 LAB — I-STAT CG4 LACTIC ACID, ED: Lactic Acid, Venous: 1.41 mmol/L (ref 0.5–1.9)

## 2017-03-22 LAB — BRAIN NATRIURETIC PEPTIDE: B Natriuretic Peptide: 35 pg/mL (ref 0.0–100.0)

## 2017-03-22 LAB — TROPONIN I: Troponin I: <0.03 ng/mL (ref <0.03)

## 2017-03-22 MED ORDER — BENZONATATE 100 MG PO CAPS: 200.0000 mg | ORAL_CAPSULE | Freq: Three times a day (TID) | ORAL | Status: DC | PRN

## 2017-03-22 MED ORDER — ALBUTEROL (5 MG/ML) CONTINUOUS INHALATION SOLN
10.0000 mg/h | INHALATION_SOLUTION | RESPIRATORY_TRACT | Status: DC
  Administered 2017-03-22: 10 mg/h via RESPIRATORY_TRACT
  Filled 2017-03-22: qty 20

## 2017-03-22 MED ORDER — ACETAMINOPHEN 325 MG PO TABS
650.0000 mg | ORAL_TABLET | Freq: Once | ORAL | Status: AC
  Administered 2017-03-22: 650 mg via ORAL
  Filled 2017-03-22: qty 2

## 2017-03-22 MED ORDER — DOCUSATE SODIUM 100 MG PO CAPS
100.0000 mg | ORAL_CAPSULE | Freq: Two times a day (BID) | ORAL | Status: DC
  Administered 2017-03-23 – 2017-03-24 (×2): 100 mg via ORAL
  Filled 2017-03-22 (×3): qty 1

## 2017-03-22 MED ORDER — METHYLPREDNISOLONE SODIUM SUCC 125 MG IJ SOLR
60.0000 mg | Freq: Four times a day (QID) | INTRAMUSCULAR | Status: AC
  Administered 2017-03-22 – 2017-03-23 (×4): 60 mg via INTRAVENOUS
  Filled 2017-03-22 (×4): qty 2

## 2017-03-22 MED ORDER — ESCITALOPRAM OXALATE 10 MG PO TABS
10.0000 mg | ORAL_TABLET | Freq: Every day | ORAL | Status: DC
  Administered 2017-03-23 – 2017-03-24 (×2): 10 mg via ORAL
  Filled 2017-03-22 (×2): qty 1

## 2017-03-22 MED ORDER — GABAPENTIN 300 MG PO CAPS
300.0000 mg | ORAL_CAPSULE | Freq: Three times a day (TID) | ORAL | Status: DC
  Administered 2017-03-23 – 2017-03-24 (×5): 300 mg via ORAL
  Filled 2017-03-22 (×5): qty 1

## 2017-03-22 MED ORDER — ACETAMINOPHEN 325 MG PO TABS: 650.0000 mg | ORAL_TABLET | Freq: Four times a day (QID) | ORAL | Status: DC | PRN

## 2017-03-22 MED ORDER — ENOXAPARIN SODIUM 40 MG/0.4ML ~~LOC~~ SOLN
40.0000 mg | SUBCUTANEOUS | Status: DC
  Administered 2017-03-22 – 2017-03-23 (×2): 40 mg via SUBCUTANEOUS
  Filled 2017-03-22 (×2): qty 0.4

## 2017-03-22 MED ORDER — BUPROPION HCL ER (XL) 150 MG PO TB24
300.0000 mg | ORAL_TABLET | ORAL | Status: DC
  Administered 2017-03-23 – 2017-03-24 (×2): 300 mg via ORAL
  Filled 2017-03-22 (×2): qty 2

## 2017-03-22 MED ORDER — MONTELUKAST SODIUM 10 MG PO TABS
10.0000 mg | ORAL_TABLET | Freq: Every day | ORAL | Status: DC
  Administered 2017-03-23: 10 mg via ORAL
  Filled 2017-03-22: qty 1

## 2017-03-22 MED ORDER — FLEET ENEMA 7-19 GM/118ML RE ENEM: 1.0000 | ENEMA | Freq: Every day | RECTAL | Status: DC | PRN

## 2017-03-22 MED ORDER — ALBUTEROL (5 MG/ML) CONTINUOUS INHALATION SOLN: 10.0000 mg/h | INHALATION_SOLUTION | RESPIRATORY_TRACT | Status: DC

## 2017-03-22 MED ORDER — IPRATROPIUM-ALBUTEROL 0.5-2.5 (3) MG/3ML IN SOLN
3.0000 mL | Freq: Four times a day (QID) | RESPIRATORY_TRACT | Status: DC
  Administered 2017-03-22: 3 mL via RESPIRATORY_TRACT
  Filled 2017-03-22: qty 3

## 2017-03-22 MED ORDER — PIPERACILLIN-TAZOBACTAM 3.375 G IVPB
3.3750 g | Freq: Three times a day (TID) | INTRAVENOUS | Status: DC
  Administered 2017-03-22 – 2017-03-24 (×5): 3.375 g via INTRAVENOUS
  Filled 2017-03-22 (×7): qty 50

## 2017-03-22 MED ORDER — ONDANSETRON HCL 4 MG PO TABS: 4.0000 mg | ORAL_TABLET | Freq: Four times a day (QID) | ORAL | Status: DC | PRN

## 2017-03-22 MED ORDER — PIPERACILLIN-TAZOBACTAM 3.375 G IVPB 30 MIN
3.3750 g | Freq: Once | INTRAVENOUS | Status: AC
  Administered 2017-03-22: 3.375 g via INTRAVENOUS
  Filled 2017-03-22: qty 50

## 2017-03-22 MED ORDER — LACTATED RINGERS IV SOLN
INTRAVENOUS | Status: DC
  Administered 2017-03-22: 23:00:00 via INTRAVENOUS

## 2017-03-22 MED ORDER — PREDNISONE 20 MG PO TABS
40.0000 mg | ORAL_TABLET | Freq: Every day | ORAL | Status: DC
  Administered 2017-03-23 – 2017-03-24 (×2): 40 mg via ORAL
  Filled 2017-03-22 (×2): qty 2

## 2017-03-22 MED ORDER — SODIUM CHLORIDE 0.9 % IV SOLN
2000.0000 mg | Freq: Once | INTRAVENOUS | Status: AC
  Administered 2017-03-22: 2000 mg via INTRAVENOUS
  Filled 2017-03-22: qty 2000

## 2017-03-22 MED ORDER — CLONAZEPAM 0.5 MG PO TABS
1.0000 mg | ORAL_TABLET | Freq: Every evening | ORAL | Status: DC | PRN
  Administered 2017-03-23: 1 mg via ORAL
  Filled 2017-03-22: qty 2

## 2017-03-22 MED ORDER — ACETAMINOPHEN 650 MG RE SUPP: 650.0000 mg | Freq: Four times a day (QID) | RECTAL | Status: DC | PRN

## 2017-03-22 MED ORDER — ASPIRIN EC 325 MG PO TBEC
325.0000 mg | DELAYED_RELEASE_TABLET | Freq: Every day | ORAL | Status: DC
  Administered 2017-03-23 – 2017-03-24 (×2): 325 mg via ORAL
  Filled 2017-03-22 (×2): qty 1

## 2017-03-22 MED ORDER — SENNA 8.6 MG PO TABS: 1.0000 | ORAL_TABLET | Freq: Every day | ORAL | Status: DC | PRN

## 2017-03-22 MED ORDER — MIRABEGRON ER 50 MG PO TB24
50.0000 mg | ORAL_TABLET | Freq: Every day | ORAL | Status: DC
  Administered 2017-03-23 – 2017-03-24 (×2): 50 mg via ORAL
  Filled 2017-03-22 (×2): qty 1

## 2017-03-22 MED ORDER — POLYETHYLENE GLYCOL 3350 17 G PO PACK: 17.0000 g | PACK | Freq: Every day | ORAL | Status: DC | PRN

## 2017-03-22 MED ORDER — HYDROCODONE-ACETAMINOPHEN 5-325 MG PO TABS
1.0000 | ORAL_TABLET | Freq: Two times a day (BID) | ORAL | Status: DC | PRN
Start: 2017-03-22 — End: 2017-03-24
  Administered 2017-03-23 – 2017-03-24 (×3): 1 via ORAL
  Filled 2017-03-22 (×3): qty 1

## 2017-03-22 MED ORDER — ONDANSETRON HCL 4 MG/2ML IJ SOLN: 4.0000 mg | Freq: Four times a day (QID) | INTRAMUSCULAR | Status: DC | PRN

## 2017-03-22 MED ORDER — MENTHOL 3 MG MT LOZG: 1.0000 | LOZENGE | OROMUCOSAL | Status: DC | PRN

## 2017-03-22 MED ORDER — METOPROLOL TARTRATE 12.5 MG HALF TABLET
12.5000 mg | ORAL_TABLET | Freq: Two times a day (BID) | ORAL | Status: DC
  Administered 2017-03-23 – 2017-03-24 (×3): 12.5 mg via ORAL
  Filled 2017-03-22 (×3): qty 1

## 2017-03-22 MED ORDER — ALBUTEROL SULFATE (2.5 MG/3ML) 0.083% IN NEBU
2.5000 mg | INHALATION_SOLUTION | RESPIRATORY_TRACT | Status: DC | PRN
  Administered 2017-03-24: 2.5 mg via RESPIRATORY_TRACT
  Filled 2017-03-22: qty 3

## 2017-03-22 MED ORDER — METHOCARBAMOL 500 MG PO TABS
500.0000 mg | ORAL_TABLET | Freq: Four times a day (QID) | ORAL | Status: DC | PRN
  Administered 2017-03-23 – 2017-03-24 (×2): 500 mg via ORAL
  Filled 2017-03-22 (×2): qty 1

## 2017-03-22 MED ORDER — FLUTICASONE PROPIONATE 50 MCG/ACT NA SUSP
1.0000 | Freq: Two times a day (BID) | NASAL | Status: DC
  Administered 2017-03-23 – 2017-03-24 (×3): 1 via NASAL
  Filled 2017-03-22: qty 16

## 2017-03-22 NOTE — ED Notes (Signed)
IV team at bedside 

## 2017-03-22 NOTE — ED Notes (Signed)
[name] (son) [phone]

## 2017-03-22 NOTE — ED Triage Notes (Signed)
Hard  stick lab notified

## 2017-03-22 NOTE — Progress Notes (Signed)
Patient taken off Bipap by RN.  RT placed patient on [place] 4 Lpm, sat 95%. Patient has no increased WOB at this time, and states that her breathing is comfortable.  RT will continue to monitor.

## 2017-03-22 NOTE — ED Triage Notes (Signed)
IV infiltrated Lt hand . Iv team paged

## 2017-03-22 NOTE — ED Notes (Signed)
This nurse attempted in and out cath twice with the help of another nurse and EMT without success.

## 2017-03-22 NOTE — ED Triage Notes (Signed)
PT arrived from [facility] via EMS for resp. Distress. Pt alert and speaking. Resp called to start BI-PAP . EDP DR Wilson Singer at bed side to asses PT.Resp at bed side to start Bi PAP

## 2017-03-22 NOTE — H&P (Signed)
History and Physical    [name]:[id number] DOB: 1930-05-05 DOA: 03/22/2017  PCP: Reynold Bowen, MD  Patient coming from: [place]  Chief Complaint: SOB  HPI: [name] is a 84 y.o. female with medical history significant of asthma, arthritis, COPD, frequent UTIs, hypertension, spinal stenosis,diet-controlled diabetes, hypertension, depression with anxiety, breast cancer (s/p ofright lumpectomy) presenting with SOB.  She was previously hospitalized with acute on chronic respiratory failure from 1/28-29 and appeared to improve but developed symptoms again on 2/1 - SOB, cough productive of yellow-green sputum.  She is unaccompanied and on BIPAP and so additional history was not available.  ED Course:  COPD exacerbation with ?viral illness.  Worse in last few days.  Started on BIPAP with some improvement.  Flu pending.  CXR negative.  Empiric antibiotics, steroids pre-hospital.  Recent admission 1/27-29 for the same.  Review of Systems: As per HPI; otherwise review of systems reviewed and negative.   Ambulatory Status:  Poor functional mobility due to weakness and poor balance as per last PT note  Past Medical History:  Diagnosis Date  . Arthritis   . Asthma   . Breast cancer ([place]) 2006   right w/lumpectomy  . COPD (chronic obstructive pulmonary disease) ([place])   . Depression   . Diabetes ([place]) 2007  . Diverticulosis of colon   . Endometrial hyperplasia 01/1996   fibroids, adenomyosis  . Frequent UTI   . History of colon polyps   . Hypertension   . Insomnia   . Osteopenia 12/08   hip  . Spinal stenosis 2001  . Vitamin D deficiency     Past Surgical History:  Procedure Laterality Date  . BILATERAL SALPINGOOPHORECTOMY  12/97  . BREAST LUMPECTOMY Right 03/2003  . CARPAL TUNNEL RELEASE    . CERVICAL FUSION    . CERVICAL LAMINECTOMY  1975   x4  . CHOLECYSTECTOMY  11/00   lap chole  . HYSTEROSCOPY  10/97   D&C, postmenopausal bleeding  .  LUMBAR LAMINECTOMY  10/06   with spinal stenosis  . manipulation of hip for dislocation    . ROTATOR CUFF REPAIR Right 11/96  . TONSILLECTOMY AND ADENOIDECTOMY    . TOTAL ABDOMINAL HYSTERECTOMY  12/97   endo hyperplasia, fibroids, adenomyosis  . TOTAL HIP ARTHROPLASTY Right 8/00  . TOTAL HIP REVISION Right 07/10/2015   Procedure: RIGHT ACETABULAR VS TOTAL HIP REVISION;  Surgeon: Gaynelle Arabian, MD;  Location: [facility];  Service: Orthopedics;  Laterality: Right;  . TOTAL KNEE ARTHROPLASTY Left 06/2003  . TOTAL KNEE ARTHROPLASTY Right 10/06  . TUBAL LIGATION Bilateral 1968    Social History   Socioeconomic History  . Marital status: Married    Spouse name: Not on file  . Number of children: Not on file  . Years of education: Not on file  . Highest education level: Not on file  Social Needs  . Financial resource strain: Not on file  . Food insecurity - worry: Not on file  . Food insecurity - inability: Not on file  . Transportation needs - medical: Not on file  . Transportation needs - non-medical: Not on file  Occupational History  . Not on file  Tobacco Use  . Smoking status: Former Smoker    Packs/day: 0.25    Years: 10.00    Pack years: 2.50    Types: Cigarettes    Last attempt to quit: 04/24/1990    Years since quitting: 26.9  . Smokeless tobacco: Never Used  Substance and Sexual Activity  . Alcohol use: Yes    Alcohol/week: 2.4 oz    Types: 4 Glasses of wine per week    Comment: occ  . Drug use: No  . Sexual activity: Not Currently  Other Topics Concern  . Not on file  Social History Narrative  . Not on file    Allergies  Allergen Reactions  . Oxycodone Hcl [Oxycodone Hcl] Rash  . Morphine Sulfate Other (See Comments)    REACTION: very anxious  . Sulfamethoxazole Other (See Comments)    REACTION: unspecified    Family History  Problem Relation Age of Onset  . Diabetes Father   . Colon cancer Father 59  . Heart failure Father   . Arthritis Mother   .  Thyroid disease Mother   . Osteoporosis Mother   . Diabetes Brother   . Diabetes Brother   . Osteoporosis Maternal Aunt   . Osteoporosis Maternal Grandmother     Prior to Admission medications   Medication Sig Start Date End Date Taking? Authorizing Provider  acetaminophen (TYLENOL) 325 MG tablet Take 2 tablets (650 mg total) by mouth every 6 (six) hours as needed for mild pain (or Fever >/= 101). 07/12/15   Perkins, Alexzandrew L, PA-C  acetaminophen (TYLENOL) 650 MG CR tablet Take 650-1,300 mg by mouth every 12 (twelve) hours as needed for pain.    [provider]  albuterol (PROAIR HFA) 108 (90 BASE) MCG/ACT inhaler Inhale 2 puffs into the lungs every 4 (four) hours as needed for shortness of breath.     [provider]  aspirin EC 325 MG tablet Take 325 mg by mouth daily.     [provider]  azithromycin (ZITHROMAX) 250 MG tablet Take 1 tablet (250 mg total) by mouth daily. 03/16/17   Debbe Odea, MD  Benzocaine-Menthol (CEPACOL SORE THROAT) 15-2.6 MG LOZG Use as directed 1 lozenge in the mouth or throat every 4 (four) hours as needed (for sore throat).    [provider]  benzonatate (TESSALON) 200 MG capsule Take 200 mg by mouth 3 (three) times daily as needed for cough.    [provider]  buPROPion (WELLBUTRIN XL) 300 MG 24 hr tablet Take 300 mg by mouth every morning.     [provider]  Calcium Carb-Cholecalciferol (CALCIUM+D3) 600-800 MG-UNIT TABS Take 1 tablet by mouth daily.    [provider]  ciclopirox (PENLAC) 8 % solution Apply topically daily. Over nail and surrounding skin    [provider]  clonazePAM (KLONOPIN) 1 MG tablet Take 1 tablet (1 mg total) by mouth at bedtime as needed (insomnia). Patient taking differently: Take 1 mg by mouth at bedtime as needed for anxiety.  08/12/16   Florencia Reasons, MD  docusate sodium (COLACE) 100 MG capsule Take 100 mg by mouth daily.    [provider]    escitalopram (LEXAPRO) 10 MG tablet Take 10 mg by mouth daily.  04/20/11   [provider]  fluticasone (FLONASE) 50 MCG/ACT nasal spray Place 1 spray into both nostrils 2 (two) times daily.    [provider]  gabapentin (NEURONTIN) 300 MG capsule TAKE ONE CAPSULE THREE TIMES DAILY 12/14/16   Magrinat, Virgie Dad, MD  HYDROcodone-acetaminophen (NORCO/VICODIN) 5-325 MG tablet Take 1 tablet by mouth 2 (two) times daily as needed for moderate pain. 03/16/17   Debbe Odea, MD  ibuprofen (ADVIL) 200 MG tablet Take 200 mg by mouth every 12 (twelve) hours as needed (pain).  [provider]  ipratropium-albuterol (DUONEB) 0.5-2.5 (3) MG/3ML SOLN Take 3 mLs by nebulization every 6 (six) hours as needed. 03/16/17   Debbe Odea, MD  methocarbamol (ROBAXIN) 500 MG tablet Take 1 tablet (500 mg total) by mouth every 6 (six) hours as needed for muscle spasms. 07/12/15   Perkins, Alexzandrew L, PA-C  metoprolol tartrate (LOPRESSOR) 25 MG tablet Take 0.5 tablets (12.5 mg total) by mouth 2 (two) times daily. 08/12/16   Florencia Reasons, MD  miconazole (MICOTIN) 2 % powder Apply topically 2 (two) times daily. To area under breasts and to bilateral groin after miconazole cream    [provider]  mirabegron ER (MYRBETRIQ) 50 MG TB24 tablet Take 50 mg by mouth daily.    [provider]  mometasone-formoterol (DULERA) 200-5 MCG/ACT AERO Inhale 1 puff into the lungs 2 (two) times daily.    [provider]  montelukast (SINGULAIR) 10 MG tablet Take 10 mg by mouth at bedtime.      [provider]  ondansetron (ZOFRAN) 4 MG tablet Take 4 mg by mouth every 8 (eight) hours as needed for nausea or vomiting.    [provider]  polyethylene glycol (MIRALAX / GLYCOLAX) packet Take 17 g by mouth daily as needed for mild constipation. 07/12/15   Perkins, Alexzandrew L, PA-C  predniSONE (DELTASONE) 10 MG tablet Take 60 mg tomorrow, 40 mg for 2 days, 20 mg for 2 days and  10 mg the last 2 days 03/16/17   Debbe Odea, MD  Propylene Glycol-Glycerin (SOOTHE) 0.6-0.6 % SOLN Apply 1 drop to eye 2 (two) times daily. 11/28/15   Domenic Moras, PA-C  senna (SENOKOT) 8.6 MG TABS tablet Take 1 tablet by mouth daily as needed for mild constipation.    [provider]  sodium phosphate (FLEET) 7-19 GM/118ML ENEM Place 1 enema rectally daily as needed for severe constipation.    [provider]    Physical Exam: Vitals:   03/22/17 1030 03/22/17 1100 03/22/17 1112 03/22/17 1115  BP: (!) 149/79 127/75 127/75 127/74  Pulse: (!) 122 (!) 117 (!) 118 (!) 116  Resp: (!) 26 (!) 26 (!) 35 (!) 26  Temp:      TempSrc:      SpO2: 98% 97% 97% 97%     General:  Appears frail and chronically ill, on BIPAP Eyes:  PERRL, EOMI, normal lids, iris ENT: BIPAP in place Neck:  no LAD, masses or thyromegaly Cardiovascular:  Tachycardia, no m/r/g. No LE edema.  Respiratory:  Mild diffuse wheezing, increased respiratory effort on BIPAP Abdomen:  soft, NT, ND, NABS Skin:  Weeping of B anterior shins with dressing in place Musculoskeletal:  grossly normal tone BUE/BLE, good ROM, no bony abnormality Lower extremity:  No LE edema.  Limited foot exam with no ulcerations.  2+ distal pulses. Psychiatric: blunted mood and affect Neurologic: unable to assess    Radiological Exams on Admission: Dg Chest Portable 1 View  Result Date: 03/22/2017 CLINICAL DATA:  Shortness of Breath EXAM: PORTABLE CHEST 1 VIEW COMPARISON:  03/14/2017 FINDINGS: Heart and mediastinal contours are within normal limits. No focal opacities or effusions. No acute bony abnormality. IMPRESSION: No active disease. Electronically Signed   By: Rolm Baptise M.D.   On: 03/22/2017 08:56    EKG: Independently reviewed.  Sinus tachycardia with rate 112; nonspecific ST changes with no evidence of acute ischemia   Labs on Admission: I have personally reviewed the available labs and imaging studies at the time of  the admission.  Pertinent labs:   Glucose 142 BNP 35 Troponin <0.03 Lactate 1.41 Hgb 15.5 WBC 7.5  Assessment/Plan Principal Problem:   Acute on chronic respiratory failure with hypoxia (HCC) Active Problems:   Essential hypertension   COPD with acute exacerbation (HCC)   Depression with anxiety   Acute on chronic respiratory failure associated with a COPD exacerbation -Patient's shortness of breath and productive cough are most likely caused by acute COPD exacerbation.  -She was recently hospitalized with the same, which may indicate bacterial superinfection, aspiration, etc. -She has been taking Azithromycin and Prednisone -She does not have fever or leukocytosis. Chest x-ray is not consistent with pneumonia -She was given a continuous neb treatment in the ED with some improvement.  -will admit patient - with her failure of outpatient therapy and recurrence after recent hospitalization, it seems likely that she will need several days of hospitalization to show sufficient improvement for discharge. -NPO until evaluated by speech therapy to r/o aspiration. -Nebulizers: scheduled Duoneb and prn albuterol -Solu-Medrol 60 mg IV q6h for today only and then prednisone daily starting today; would suggest prolonged taper. -IV Zosyn - was given Vanc and Zosyn in the ER but there is likely little reason to continue Vanc for now  -Continue Singulair  HTN -Continue Metoprolol, first dose now   Depression/anxiety -Continue Wellbutrin, Klonopin, and Lexapro   DVT prophylaxis: Lovenox  Code Status:  DNR - Gold paper present at bedside Family Communication: None present Disposition Plan:  Back to SNF once clinically improved Consults called: CM/SW/PT/OT/Nutrition/RT; ST for swallow eval  Admission status: Admit - It is my clinical opinion that admission to INPATIENT is reasonable and necessary because this patient will require at least 2 midnights in the hospital to treat this condition  based on the medical complexity of the problems presented.  Given the aforementioned information, the predictability of an adverse outcome is felt to be significant.    Karmen Bongo MD [facility]  If note is complete, please contact covering daytime or nighttime physician. [email or web address] Password [email or web address]  03/22/2017, 12:45 PM

## 2017-03-22 NOTE — ED Triage Notes (Signed)
IV flushes but can not draw blood from site.

## 2017-03-22 NOTE — ED Triage Notes (Signed)
Request EDP if [place] Iv placement can be done.

## 2017-03-22 NOTE — ED Triage Notes (Signed)
Consult to IV team for IV start foranti-bx.

## 2017-03-22 NOTE — ED Notes (Signed)
Due to patient having to wear BiPap mask, noticeable, blanchable spot on crease of nose.  Now patient is on Nasal Cannula.

## 2017-03-22 NOTE — ED Triage Notes (Signed)
PT received 2 duo nebs , 125lSmedrol

## 2017-03-22 NOTE — ED Provider Notes (Signed)
[facility] EMERGENCY DEPARTMENT Provider Note   CSN: [id number] Arrival date & time: 03/22/17  7353     History   Chief Complaint Chief Complaint  Patient presents with  . Shortness of Breath    HPI ANTONEA GAUT is a 84 y.o. female.  HPI   84 year old female with dyspnea.  Worsening over the past 2 days.  She has a past history of COPD.  She is not on supplemental oxygen at baseline.  Wheezing per EMS.  She received 2 duo nebs and 125 mg of Solu-Medrol prior to arrival.  She reports headache, neck and shoulder pain but this is a result of a fall which she was evaluated for on 03/14/17.  She denies any acute pain.  Subjective fever.  Past Medical History:  Diagnosis Date  . Arthritis   . Asthma   . Breast cancer ([place]) 2006   right w/lumpectomy  . COPD (chronic obstructive pulmonary disease) ([place])   . Depression   . Diabetes ([place]) 2007  . Diverticulosis of colon   . Endometrial hyperplasia 01/1996   fibroids, adenomyosis  . Frequent UTI   . History of colon polyps   . Hypertension   . Insomnia   . Osteopenia 12/08   hip  . Spinal stenosis 2001  . Vitamin D deficiency     Patient Active Problem List   Diagnosis Date Noted  . Laceration of forehead   . Acute on chronic respiratory failure with hypoxia ([place]) 03/15/2017  . Fall 03/15/2017  . Depression with anxiety 03/15/2017  . COPD exacerbation ([place]) 03/15/2017  . Asthma exacerbation 03/15/2017  . COPD with acute exacerbation ([place])   . Acute lower UTI   . Acute respiratory distress 11/08/2016  . Sepsis ([place]) 11/08/2016  . Mild persistent asthma without complication 29/92/4268  . Morbid obesity due to excess calories ([place]) 09/02/2016  . Esophageal dysfunction 09/01/2016  . PNA (pneumonia) 08/09/2016  . Acute respiratory failure ([place]) 08/09/2016  . Wheelchair bound 08/09/2016  . Wound healing, delayed 08/09/2016  . Acute hypoxemic respiratory failure ([place]) 08/09/2016  . HCAP  (healthcare-associated pneumonia) 07/07/2016  . Chronic ulcer of right leg ([place]) 07/07/2016  . SIRS (systemic inflammatory response syndrome) ([place]) 05/08/2016  . Community acquired pneumonia 05/08/2016  . 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 Breast cancer, right breast ([place]) 07/18/2014  . CONCUSSION WITH LOC OF UNSPECIFIED DURATION 07/19/2009  . OTH SYMPTOMS INVOLVING RESPIRATORY SYSTEM&CHEST 11/12/2008  . UNSPECIFIED DISORDER OF ANKLE AND FOOT JOINT 05/11/2008  . CERUMEN IMPACTION, BILATERAL 05/04/2008  . Dysmetabolic syndrome X 34/19/6222  . UNS ADVRS EFF UNS RX MEDICINAL&BIOLOGICAL SBSTNC 08/12/2007  . Other and unspecified hyperlipidemia 04/01/2007  . INSOMNIA, PERSISTENT 09/09/2006  . Depression 09/09/2006  . Essential hypertension 09/09/2006  . Asthma, chronic, unspecified asthma severity, with acute exacerbation 09/09/2006  . DIVERTICULOSIS, COLON 09/09/2006  . Osteoarthritis 09/09/2006  . Spinal stenosis 09/09/2006  . COLONIC POLYPS, HX OF 09/09/2006    Past Surgical History:  Procedure Laterality Date  . BILATERAL SALPINGOOPHORECTOMY  12/97  . BREAST LUMPECTOMY Right 03/2003  . CARPAL TUNNEL RELEASE    . CERVICAL FUSION    . CERVICAL LAMINECTOMY  1975   x4  . CHOLECYSTECTOMY  11/00   lap chole  . HYSTEROSCOPY  10/97   D&C, postmenopausal bleeding  . LUMBAR LAMINECTOMY  10/06   with spinal stenosis  . manipulation of hip for dislocation    . ROTATOR CUFF REPAIR Right 11/96  . TONSILLECTOMY  AND ADENOIDECTOMY    . TOTAL ABDOMINAL HYSTERECTOMY  12/97   endo hyperplasia, fibroids, adenomyosis  . TOTAL HIP ARTHROPLASTY Right 8/00  . TOTAL HIP REVISION Right 07/10/2015   Procedure: RIGHT ACETABULAR VS TOTAL HIP REVISION;  Surgeon: Gaynelle Arabian, MD;  Location: [facility];  Service: Orthopedics;  Laterality: Right;  . TOTAL KNEE ARTHROPLASTY Left 06/2003  . TOTAL KNEE ARTHROPLASTY Right 10/06  . TUBAL LIGATION Bilateral 1968    OB History    Gravida Para Term  Preterm AB Living   3 3 3          SAB TAB Ectopic Multiple Live Births                   Home Medications    Prior to Admission medications   Medication Sig Start Date End Date Taking? Authorizing Provider  acetaminophen (TYLENOL) 325 MG tablet Take 2 tablets (650 mg total) by mouth every 6 (six) hours as needed for mild pain (or Fever >/= 101). 07/12/15   Perkins, Alexzandrew L, PA-C  acetaminophen (TYLENOL) 650 MG CR tablet Take 650-1,300 mg by mouth every 12 (twelve) hours as needed for pain.    [provider]  albuterol (PROAIR HFA) 108 (90 BASE) MCG/ACT inhaler Inhale 2 puffs into the lungs every 4 (four) hours as needed for shortness of breath.     [provider]  aspirin EC 325 MG tablet Take 325 mg by mouth daily.     [provider]  azithromycin (ZITHROMAX) 250 MG tablet Take 1 tablet (250 mg total) by mouth daily. 03/16/17   Debbe Odea, MD  Benzocaine-Menthol (CEPACOL SORE THROAT) 15-2.6 MG LOZG Use as directed 1 lozenge in the mouth or throat every 4 (four) hours as needed (for sore throat).    [provider]  benzonatate (TESSALON) 200 MG capsule Take 200 mg by mouth 3 (three) times daily as needed for cough.    [provider]  buPROPion (WELLBUTRIN XL) 300 MG 24 hr tablet Take 300 mg by mouth every morning.     [provider]  Calcium Carb-Cholecalciferol (CALCIUM+D3) 600-800 MG-UNIT TABS Take 1 tablet by mouth daily.    [provider]  ciclopirox (PENLAC) 8 % solution Apply topically daily. Over nail and surrounding skin    [provider]  clonazePAM (KLONOPIN) 1 MG tablet Take 1 tablet (1 mg total) by mouth at bedtime as needed (insomnia). Patient taking differently: Take 1 mg by mouth at bedtime as needed for anxiety.  08/12/16   Florencia Reasons, MD  docusate sodium (COLACE) 100 MG capsule Take 100 mg by mouth daily.    [provider]  escitalopram (LEXAPRO) 10 MG tablet Take 10 mg by mouth  daily.  04/20/11   [provider]  fluticasone (FLONASE) 50 MCG/ACT nasal spray Place 1 spray into both nostrils 2 (two) times daily.    [provider]  gabapentin (NEURONTIN) 300 MG capsule TAKE ONE CAPSULE THREE TIMES DAILY 12/14/16   Magrinat, Virgie Dad, MD  HYDROcodone-acetaminophen (NORCO/VICODIN) 5-325 MG tablet Take 1 tablet by mouth 2 (two) times daily as needed for moderate pain. 03/16/17   Debbe Odea, MD  ibuprofen (ADVIL) 200 MG tablet Take 200 mg by mouth every 12 (twelve) hours as needed (pain).     [provider]  ipratropium-albuterol (DUONEB) 0.5-2.5 (3) MG/3ML SOLN Take 3 mLs by nebulization every 6 (six) hours as needed. 03/16/17   Debbe Odea, MD  methocarbamol (ROBAXIN) 500 MG  tablet Take 1 tablet (500 mg total) by mouth every 6 (six) hours as needed for muscle spasms. 07/12/15   Perkins, Alexzandrew L, PA-C  metoprolol tartrate (LOPRESSOR) 25 MG tablet Take 0.5 tablets (12.5 mg total) by mouth 2 (two) times daily. 08/12/16   Florencia Reasons, MD  miconazole (MICOTIN) 2 % powder Apply topically 2 (two) times daily. To area under breasts and to bilateral groin after miconazole cream    [provider]  mirabegron ER (MYRBETRIQ) 50 MG TB24 tablet Take 50 mg by mouth daily.    [provider]  mometasone-formoterol (DULERA) 200-5 MCG/ACT AERO Inhale 1 puff into the lungs 2 (two) times daily.    [provider]  montelukast (SINGULAIR) 10 MG tablet Take 10 mg by mouth at bedtime.      [provider]  ondansetron (ZOFRAN) 4 MG tablet Take 4 mg by mouth every 8 (eight) hours as needed for nausea or vomiting.    [provider]  polyethylene glycol (MIRALAX / GLYCOLAX) packet Take 17 g by mouth daily as needed for mild constipation. 07/12/15   Perkins, Alexzandrew L, PA-C  predniSONE (DELTASONE) 10 MG tablet Take 60 mg tomorrow, 40 mg for 2 days, 20 mg for 2 days and 10 mg the last 2 days 03/16/17   Debbe Odea, MD    Propylene Glycol-Glycerin (SOOTHE) 0.6-0.6 % SOLN Apply 1 drop to eye 2 (two) times daily. 11/28/15   Domenic Moras, PA-C  senna (SENOKOT) 8.6 MG TABS tablet Take 1 tablet by mouth daily as needed for mild constipation.    [provider]  sodium phosphate (FLEET) 7-19 GM/118ML ENEM Place 1 enema rectally daily as needed for severe constipation.    [provider]    Family History Family History  Problem Relation Age of Onset  . Diabetes Father   . Colon cancer Father 51  . Heart failure Father   . Arthritis Mother   . Thyroid disease Mother   . Osteoporosis Mother   . Diabetes Brother   . Diabetes Brother   . Osteoporosis Maternal Aunt   . Osteoporosis Maternal Grandmother     Social History Social History   Tobacco Use  . Smoking status: Former Smoker    Packs/day: 0.25    Years: 10.00    Pack years: 2.50    Types: Cigarettes    Last attempt to quit: 04/24/1990    Years since quitting: 26.9  . Smokeless tobacco: Never Used  Substance Use Topics  . Alcohol use: Yes    Alcohol/week: 2.4 oz    Types: 4 Glasses of wine per week    Comment: occ  . Drug use: No     Allergies   Oxycodone hcl [oxycodone hcl]; Morphine sulfate; and Sulfamethoxazole   Review of Systems Review of Systems  All systems reviewed and negative, other than as noted in HPI.  Physical Exam Updated Vital Signs Pulse (!) 115   Resp (!) 25   SpO2 98%   Physical Exam  Constitutional: She appears toxic. She appears distressed.  Appears ill.  Increased work of breathing.  Drowsy but does open her eyes to voice.  HENT:  Head: Normocephalic.  Subacute appearing ecchymosis to right forehead.  Eyes: Conjunctivae are normal. Pupils are equal, round, and reactive to light. Right eye exhibits no discharge. Left eye exhibits no discharge.  Cardiovascular: Regular rhythm and normal heart sounds. Exam reveals no gallop and no friction rub.  No murmur heard. Tachycardic  Pulmonary/Chest: She is in respiratory distress. She has wheezes.  Tachypnea.  Accessory muscle usage.  Abdominal: Soft. She exhibits no distension. There is no tenderness.  Musculoskeletal: She exhibits edema. She exhibits no tenderness.  Lower extremity edema with chronic appearing wounds to bilateral shins.  Neurological:  Drowsy.  Opens eyes to voice.  Answers simple questions appropriately.  Skin: Skin is warm and dry.  Nursing note and vitals reviewed.    ED Treatments / Results  Labs (all labs ordered are listed, but only abnormal results are displayed) Labs Reviewed  CBC WITH DIFFERENTIAL/PLATELET - Abnormal; Notable for the following components:      Result Value   Hemoglobin 15.5 (*)    HCT 47.6 (*)    Lymphs Abs 0.5 (*)    All other components within normal limits  BASIC METABOLIC PANEL - Abnormal; Notable for the following components:   Chloride 98 (*)    Glucose, Bld 142 (*)    All other components within normal limits  URINALYSIS, ROUTINE W REFLEX MICROSCOPIC - Abnormal; Notable for the following components:   Protein, ur 30 (*)    Bacteria, UA RARE (*)    Squamous Epithelial / LPF 0-5 (*)    All other components within normal limits  BASIC METABOLIC PANEL - Abnormal; Notable for the following components:   Chloride 99 (*)    Glucose, Bld 142 (*)    All other components within normal limits  CBC - Abnormal; Notable for the following components:   Hemoglobin 15.1 (*)    All other components within normal limits  MRSA PCR SCREENING  RESPIRATORY PANEL BY PCR  CULTURE, EXPECTORATED SPUTUM-ASSESSMENT  BRAIN NATRIURETIC PEPTIDE  TROPONIN I  INFLUENZA PANEL BY PCR (TYPE A & B)  I-STAT CG4 LACTIC ACID, ED  I-STAT CG4 LACTIC ACID, ED    EKG  EKG Interpretation  Date/Time:  Monday March 22 2017 08:28:59 EST Ventricular Rate:  112 PR Interval:    QRS Duration: 94 QT Interval:  329 QTC Calculation: 449 R Axis:   -27 Text Interpretation:  Sinus  tachycardia Borderline left axis deviation Borderline T abnormalities, anterior leads Confirmed by Virgel Manifold [phone]) on 03/22/2017 9:28:14 AM       Radiology Dg Chest Portable 1 View  Result Date: 03/22/2017 CLINICAL DATA:  Shortness of Breath EXAM: PORTABLE CHEST 1 VIEW COMPARISON:  03/14/2017 FINDINGS: Heart and mediastinal contours are within normal limits. No focal opacities or effusions. No acute bony abnormality. IMPRESSION: No active disease. Electronically Signed   By: Rolm Baptise M.D.   On: 03/22/2017 08:56    Procedures Procedures (including critical care time)  CRITICAL CARE Performed by: Virgel Manifold Total critical care time: 40 minutes Critical care time was exclusive of separately billable procedures and treating other patients. Critical care was necessary to treat or prevent imminent or life-threatening deterioration. Critical care was time spent personally by me on the following activities: development of treatment plan with patient and/or surrogate as well as nursing, discussions with consultants, evaluation of patient's response to treatment, examination of patient, obtaining history from patient or surrogate, ordering and performing treatments and interventions, ordering and review of laboratory studies, ordering and review of radiographic studies, pulse oximetry and re-evaluation of patient's condition.   Medications Ordered in ED Medications  albuterol (PROVENTIL,VENTOLIN) solution continuous neb (10 mg/hr Nebulization New Bag/Given 03/22/17 0835)  vancomycin (VANCOCIN) 2,000 mg in sodium chloride 0.9 % 500 mL IVPB (not administered)  piperacillin-tazobactam (ZOSYN) IVPB 3.375 g (not administered)  acetaminophen (TYLENOL) tablet 650 mg (not administered)     Initial Impression / Assessment and Plan / ED Course  I have reviewed the triage vital signs and the nursing notes.  Pertinent labs & imaging results that were available during my care of the patient were  reviewed by me and considered in my medical decision making (see chart for details).    84 year old female with respiratory distress.  Wheezing on exam.  She received steroids and duo nebs prior to arrival to the emergency room.  She was very drowsy upon arrival but answering questions  She was started on BiPAP.  Continue nebs.  Noted to be febrile.  Recent admission.  She was empirically given antibiotics to cover for possible nosocomial infection.  Will also check for influenza. She will require admission.   Final Clinical Impressions(s) / ED Diagnoses   Final diagnoses:  Acute on chronic respiratory failure with hypoxia [facility])    ED Discharge Orders    None       Virgel Manifold, MD 03/24/17 1237

## 2017-03-22 NOTE — ED Notes (Signed)
Bladder scan completed, 369 ml resulted. RN notified.

## 2017-03-22 NOTE — ED Triage Notes (Signed)
[place] machine at bed side / EDP notified

## 2017-03-22 NOTE — ED Notes (Signed)
Paged admitting doctor due to family at bedside would like to speak to her.

## 2017-03-23 ENCOUNTER — Encounter (HOSPITAL_COMMUNITY): Payer: Self-pay | Admitting: General Practice

## 2017-03-23 ENCOUNTER — Other Ambulatory Visit: Payer: Self-pay

## 2017-03-23 ENCOUNTER — Inpatient Hospital Stay (HOSPITAL_COMMUNITY): Payer: Medicare Other

## 2017-03-23 LAB — CBC
HEMATOCRIT: 45.2 % (ref 36.0–46.0)
HEMOGLOBIN: 15.1 g/dL — AB (ref 12.0–15.0)
MCH: 32.8 pg (ref 26.0–34.0)
MCHC: 33.4 g/dL (ref 30.0–36.0)
MCV: 98 fL (ref 78.0–100.0)
Platelets: 153 10*3/uL (ref 150–400)
RBC: 4.61 MIL/uL (ref 3.87–5.11)
RDW: 13.8 % (ref 11.5–15.5)
WBC: 5.8 10*3/uL (ref 4.0–10.5)

## 2017-03-23 LAB — RESPIRATORY PANEL BY PCR
Adenovirus: NOT DETECTED
Bordetella pertussis: NOT DETECTED
CORONAVIRUS HKU1-RVPPCR: NOT DETECTED
Chlamydophila pneumoniae: NOT DETECTED
Coronavirus 229E: NOT DETECTED
Coronavirus NL63: NOT DETECTED
Coronavirus OC43: NOT DETECTED
INFLUENZA A-RVPPCR: NOT DETECTED
INFLUENZA B-RVPPCR: NOT DETECTED
METAPNEUMOVIRUS-RVPPCR: NOT DETECTED
Mycoplasma pneumoniae: NOT DETECTED
PARAINFLUENZA VIRUS 2-RVPPCR: NOT DETECTED
PARAINFLUENZA VIRUS 3-RVPPCR: NOT DETECTED
PARAINFLUENZA VIRUS 4-RVPPCR: NOT DETECTED
Parainfluenza Virus 1: NOT DETECTED
RESPIRATORY SYNCYTIAL VIRUS-RVPPCR: NOT DETECTED
RHINOVIRUS / ENTEROVIRUS - RVPPCR: NOT DETECTED

## 2017-03-23 LAB — MRSA PCR SCREENING: MRSA by PCR: NEGATIVE

## 2017-03-23 LAB — BASIC METABOLIC PANEL
ANION GAP: 12 (ref 5–15)
BUN: 11 mg/dL (ref 6–20)
CHLORIDE: 99 mmol/L — AB (ref 101–111)
CO2: 30 mmol/L (ref 22–32)
Calcium: 9.5 mg/dL (ref 8.9–10.3)
Creatinine, Ser: 0.63 mg/dL (ref 0.44–1.00)
GFR calc non Af Amer: >60 mL/min (ref >60)
Glucose, Bld: 142 mg/dL — ABNORMAL HIGH (ref 65–99)
Potassium: 4.4 mmol/L (ref 3.5–5.1)
Sodium: 141 mmol/L (ref 135–145)

## 2017-03-23 MED ORDER — IPRATROPIUM-ALBUTEROL 0.5-2.5 (3) MG/3ML IN SOLN
3.0000 mL | Freq: Three times a day (TID) | RESPIRATORY_TRACT | Status: DC
  Administered 2017-03-23 – 2017-03-24 (×5): 3 mL via RESPIRATORY_TRACT
  Filled 2017-03-23 (×5): qty 3

## 2017-03-23 MED ORDER — ORAL CARE MOUTH RINSE
15.0000 mL | Freq: Two times a day (BID) | OROMUCOSAL | Status: DC
  Administered 2017-03-23 – 2017-03-24 (×3): 15 mL via OROMUCOSAL

## 2017-03-23 NOTE — Progress Notes (Signed)
Modified Barium Swallow Progress Note  Patient Details  Name: COLETTA LOCKNER MRN: [id number] Date of Birth: August 04, 1930  Today's Date: 03/23/2017  Modified Barium Swallow completed.  Full report located under Chart Review in the Imaging Section.  Brief recommendations include the following:  Clinical Impression  Pt demonstrated typical swallow pattern of pt 84 years old with what appears to be significant COPD. Strong COPD-like cough prior to consuming po's. Normal flash penetration with thin and one instance of (mildmod) vallecular and pyriform sinus residue that she denied sensing but spontaneously swallowed to clear. Dyspnea witnessed with pt resting between several barium trials. MBS does not diagnose esophageal d/o but barium pill hesitated at GE junction but cleared with additional sips thin. Recommend eating when respiratory status stable and breaks when needed, regular texture, thin liquids sitting in upright position, small sips (reviewed with pt) and straws allowed and pills whole in applesauce followed by sips thin. No follow up needed at present.       Swallow Evaluation Recommendations       SLP Diet Recommendations: Regular solids;Thin liquid   Liquid Administration via: Straw;Cup   Medication Administration: Whole meds with puree   Supervision: Patient able to self feed;Intermittent supervision to cue for compensatory strategies   Compensations: Slow rate;Small sips/bites(follow pills in applesauce with sips thin)   Postural Changes: Seated upright at 90 degrees   Oral Care Recommendations: Oral care BID        Houston Siren 03/23/2017,10:16 AM   Orbie Pyo Colvin Caroli.Ed [employer] [phone]

## 2017-03-23 NOTE — Progress Notes (Addendum)
Initial Nutrition Assessment  DOCUMENTATION CODES:   Obesity unspecified  INTERVENTION:    Snacks TID between meals per patient request  Recommend liberalize diet to regular  NUTRITION DIAGNOSIS:   Inadequate oral intake related to other (see comment)(dislike of food at nursing facility) as evidenced by per patient/family report, percent weight loss(12% weight loss within 4.5 months).  GOAL:   Patient will meet greater than or equal to 90% of their needs  MONITOR:   PO intake  REASON FOR ASSESSMENT:   Malnutrition Screening Tool, Consult COPD Protocol  ASSESSMENT:   84 yo female with PMH of asthma, depression, HTN, arthritis, diverticulosis, breast cancer, spinal stenosis, osteopenia, vitamin D deficiency, diabetes, COPD, frequent UTIs who was admitted on 2/4 with COPD exacerbation.   Patient reports that she has been eating poorly because she does not like the food that is served at her nursing facility. She thinks she has not been eating as much as she used to. She has been eating smaller portions. Patient complained about being on a carbohydrate restricted diet, stating, "I am not diabetic."  CBG's are not being checked.   S/P MBS with SLP; rec regular consistency solids and thin liquids. From review of weight encounters, she has lost 12% of her usual weight in the past 4.5 months, which is significant.  Labs and medications reviewed.  NUTRITION - FOCUSED PHYSICAL EXAM:    Most Recent Value  Orbital Region  No depletion  Upper Arm Region  No depletion  Thoracic and Lumbar Region  No depletion  Buccal Region  No depletion  Temple Region  Mild depletion  Clavicle Bone Region  No depletion  Clavicle and Acromion Bone Region  No depletion  Scapular Bone Region  No depletion  Dorsal Hand  Mild depletion  Patellar Region  Unable to assess  Anterior Thigh Region  Unable to assess  Posterior Calf Region  Unable to assess  Edema (RD Assessment)  Unable to assess   Hair  Reviewed  Eyes  Reviewed  Mouth  Reviewed  Skin  Reviewed  Nails  Reviewed       Diet Order:  Aspiration precautions Diet Carb Modified Fluid consistency: Thin; Room service appropriate? Yes  EDUCATION NEEDS:   No education needs have been identified at this time  Skin:  Skin Assessment: Skin Integrity Issues: Skin Integrity Issues:: Other (Comment) Other: abrasions to bilateral LE  Last BM:  unknown  Height:   Ht Readings from Last 1 Encounters:  03/15/17 5\' 6"  (1.676 m)    Weight:   Wt Readings from Last 1 Encounters:  03/23/17 192 lb 14.4 oz (87.5 kg)    Ideal Body Weight:  59.1 kg  BMI:  Body mass index is 31.14 kg/m.  Estimated Nutritional Needs:   Kcal:  1600-1800  Protein:  85-95 gm  Fluid:  1.6-1.8 L   Molli Barrows, RD, LDN, [place] Pager [phone] After Hours Pager [phone]

## 2017-03-23 NOTE — Plan of Care (Signed)
  Progressing Clinical Measurements: Ability to maintain clinical measurements within normal limits will improve 03/23/2017 0429 - Progressing by Colonel Bald, RN Note VSS. Respiratory complications will improve 03/23/2017 0429 - Progressing by Colonel Bald, RN Note O2 sats 96-100% on 4L/[place]; no s/s of respiratory distress.

## 2017-03-23 NOTE — Evaluation (Signed)
Physical Therapy Evaluation Patient Details Name: [name] MRN: [id number] DOB: 12/25/1930 Today's Date: 03/23/2017   History of Present Illness  Pt is an 84 y.o. female who was admitted from [facility] 03/22/17 with acute on chronic respiratory failure associated with a COPD exacerbation. Recent admission for similar symptoms 1/28-29. She has a PMH significant for arthritis, asthma, breast cancer, COPD, depression, diabetes, diverticulosis of colon, endometrial hyperplasia, frequent UTI, hypertension, history of colon polyps, insomnia, osteopenia, spinal stenosis, vitamin D deficiency.   Clinical Impression  Pt admitted with/for above state complications.  Pt needing min assist generally overall for basic mobility.  Pt currently limited functionally due to the problems listed. ( See problems list.)   Pt will benefit from PT to maximize function and safety in order to get ready for next venue listed below.     Follow Up Recommendations SNF    Equipment Recommendations  None recommended by PT    Recommendations for Other Services       Precautions / Restrictions Precautions Precautions: Fall Restrictions Weight Bearing Restrictions: No      Mobility  Bed Mobility Overal bed mobility: Needs Assistance Bed Mobility: Supine to Sit     Supine to sit: Min guard     General bed mobility comments: Min guard assist for safety and significantly increased time. Min scooting assist once fatigured  Transfers Overall transfer level: Needs assistance Equipment used: Rolling walker (2 wheeled) Transfers: Sit to/from Stand Sit to Stand: Min assist Stand pivot transfers: Min assist       General transfer comment: Min assist to power up to standing.   Needed minimal stability and RW control  Ambulation/Gait             General Gait Details: pivot steps only  Stairs            Wheelchair Mobility    Modified Rankin (Stroke Patients Only)       Balance Overall  balance assessment: History of Falls;Needs assistance Sitting-balance support: Feet supported;No upper extremity supported Sitting balance-Leahy Scale: Fair     Standing balance support: Bilateral upper extremity supported Standing balance-Leahy Scale: Poor Standing balance comment: Relies on B UE support for standing.                              Pertinent Vitals/Pain Pain Assessment: 0-10 Pain Score: 5  Faces Pain Scale: Hurts little more Pain Location: head Pain Descriptors / Indicators: Aching Pain Intervention(s): Monitored during session    Home Living Family/patient expects to be discharged to:: Inpatient rehab Living Arrangements: Spouse/significant other             Home Equipment: Wheelchair - [employer] - 2 wheels      Prior Function Level of Independence: Needs assistance   Gait / Transfers Assistance Needed: Has been working on ambulation at [employer] with therapy.   ADL's / Homemaking Assistance Needed: Requires assistance for LB and has not been able to shower. Does UB ADL with set-up.   Comments: States she has been able to transfer herself to and from w/c. However, does not self-propel her wheelchair.      Hand Dominance        Extremity/Trunk Assessment   Upper Extremity Assessment Upper Extremity Assessment: Defer to OT evaluation    Lower Extremity Assessment Lower Extremity Assessment: Generalized weakness;Overall [facility] for tasks assessed(hip flexor and df/pf weaknesses bil left >right)    Cervical / Trunk  Assessment Cervical / Trunk Assessment: Kyphotic  Communication   Communication: No difficulties  Cognition Arousal/Alertness: Awake/alert Behavior During Therapy: WFL for tasks assessed/performed Overall Cognitive Status: No family/caregiver present to determine baseline cognitive functioning                                 General Comments: Decreased awareness and executive functioning. No family  present to determine baseline. Does report that her head still hurts from a fall that happened over a month ago.       General Comments General comments (skin integrity, edema, etc.): sats on 2L [place] at 96% and on RA sats maintain in the low 90's    Exercises     Assessment/Plan    PT Assessment Patient needs continued PT services  PT Problem List Decreased strength;Decreased activity tolerance;Decreased balance;Decreased mobility;Decreased knowledge of use of DME;Cardiopulmonary status limiting activity;Pain       PT Treatment Interventions DME instruction;Gait training;Functional mobility training;Therapeutic activities;Therapeutic exercise;Balance training;Patient/family education    PT Goals (Current goals can be found in the Care Plan section)  Acute Rehab PT Goals Patient Stated Goal: Back to [facility] for rehab PT Goal Formulation: With patient Time For Goal Achievement: 03/29/17 Potential to Achieve Goals: Good    Frequency Min 3X/week   Barriers to discharge        Co-evaluation               AM-PAC PT "6 Clicks" Daily Activity  Outcome Measure Difficulty turning over in bed (including adjusting bedclothes, sheets and blankets)?: A Little Difficulty moving from lying on back to sitting on the side of the bed? : A Little Difficulty sitting down on and standing up from a chair with arms (e.g., wheelchair, bedside commode, etc,.)?: Unable Help needed moving to and from a bed to chair (including a wheelchair)?: A Little Help needed walking in hospital room?: A Lot Help needed climbing 3-5 steps with a railing? : Total 6 Click Score: 13    End of Session   Activity Tolerance: Patient limited by fatigue Patient left: in chair;with call bell/phone within reach;with chair alarm set Nurse Communication: Mobility status PT Visit Diagnosis: Unsteadiness on feet (R26.81);Other abnormalities of gait and mobility (R26.89);History of falling (Z91.81);Muscle  weakness (generalized) (M62.81)    Time: 9470-9628 PT Time Calculation (min) (ACUTE ONLY): 28 min   Charges:   PT Evaluation $PT Eval Moderate Complexity: 1 Mod PT Treatments $Therapeutic Activity: 8-22 mins   PT G Codes:        04/19/17  Donnella Sham, PT [phone]  (pager)  Tessie Fass Adeline Petitfrere April 19, 2017, 5:42 PM

## 2017-03-23 NOTE — Progress Notes (Signed)
PROGRESS NOTE    [name]  NWG:[id number] DOB: [birth year] DOA: 03/22/2017 PCP: Reynold Bowen, MD  Outpatient Specialists:   Brief Narrative: Patient is an 84 year old female with past medical history significant for asthma, arthritis, COPD, frequent UTIs, hypertension, spinal stenosis, diet-controlled diabetes mellitus, hypertension, depression, anxiety, breast cancer status post right lumpectomy.  Patient is admitted with shortness of breath.  Patient is being managed for possible COPD exacerbation with possible acute tracheobronchitis.  Patient is not a particularly good historian.   Assessment & Plan:   Principal Problem:   Acute on chronic respiratory failure with hypoxia (HCC) Active Problems:   Essential hypertension   COPD with acute exacerbation (HCC)   Depression with anxiety   Acute on chronic respiratory failure/likely COPD exacerbation: -Continue nebulizer treatment, steroids, antibiotics and Singulair. -Patient seems to be improving. -We will continue current management.  COPD with exacerbation: -See above.  Possible acute tracheal bronchitis: -Continue above treatment. -Respiratory panel is negative. -Influenza testing also came back negative. -Patient is on antibiotics, will continue for now.  Hypertension: -Patient is on metoprolol. -Continue to monitor respiratory status closely. -Blood pressure is reasonably controlled for age. -Continue current regimen.  Anxiety/depression: -Stable.  Hopefully, the patient will be discharged in the next 24-48 hours.  DVT prophylaxis: Subcu Lovenox. Code Status: DNR. Family Communication:  Disposition Plan: Likely back to skilled nursing facility eventually.   Consultants:   None.  Procedures:   None.  Antimicrobials:   IV Zosyn.   Subjective: Patient is a poor historian.  However, the patient looks much improved.  Shortness of breath has improved significantly.  No significant coughing noted  during this consultation.  Objective: Vitals:   03/23/17 1013 03/23/17 1200 03/23/17 1300 03/23/17 1433  BP: (!) 171/96 (!) 149/94 (!) 145/82   Pulse: (!) 105 72 95   Resp:   18   Temp:   99 F (37.2 C)   TempSrc:   Oral   SpO2:  96% 96% 96%  Weight:        Intake/Output Summary (Last 24 hours) at 03/23/2017 1459 Last data filed at 03/23/2017 1300 Gross per 24 hour  Intake 1217.5 ml  Output 503 ml  Net 714.5 ml   Filed Weights   03/22/17 1930 03/23/17 0502  Weight: 87.9 kg (193 lb 12.6 oz) 87.5 kg (192 lb 14.4 oz)    Examination:  General exam: Appears calm and comfortable  Respiratory system: Clear to auscultation. Respiratory effort normal. Cardiovascular system: S1 & S2. No pedal edema. Gastrointestinal system: Abdomen is nondistended, soft and nontender. No organomegaly or masses felt. Normal bowel sounds heard. Central nervous system: Awake and alert.  Patient moves all limbs.   Extremities: Symmetric 5 x 5 power.  Data Reviewed: I have personally reviewed following labs and imaging studies  CBC: Recent Labs  Lab 03/22/17 0941 03/23/17 0357  WBC 7.5 5.8  NEUTROABS 6.8  --   HGB 15.5* 15.1*  HCT 47.6* 45.2  MCV 98.6 98.0  PLT 154 578   Basic Metabolic Panel: Recent Labs  Lab 03/22/17 0941 03/23/17 0357  NA 138 141  K 3.8 4.4  CL 98* 99*  CO2 28 30  GLUCOSE 142* 142*  BUN 9 11  CREATININE 0.72 0.63  CALCIUM 9.0 9.5   GFR: Estimated Creatinine Clearance: 57.3 mL/min (by C-G formula based on SCr of 0.63 mg/dL). Liver Function Tests: No results for input(s): AST, ALT, ALKPHOS, BILITOT, PROT, ALBUMIN in the last 168 hours. No results for input(s):  LIPASE, AMYLASE in the last 168 hours. No results for input(s): AMMONIA in the last 168 hours. Coagulation Profile: No results for input(s): INR, PROTIME in the last 168 hours. Cardiac Enzymes: Recent Labs  Lab 03/22/17 0941  TROPONINI <0.03   BNP (last 3 results) No results for input(s): PROBNP in  the last 8760 hours. HbA1C: No results for input(s): HGBA1C in the last 72 hours. CBG: No results for input(s): GLUCAP in the last 168 hours. Lipid Profile: No results for input(s): CHOL, HDL, LDLCALC, TRIG, CHOLHDL, LDLDIRECT in the last 72 hours. Thyroid Function Tests: No results for input(s): TSH, T4TOTAL, FREET4, T3FREE, THYROIDAB in the last 72 hours. Anemia Panel: No results for input(s): VITAMINB12, FOLATE, FERRITIN, TIBC, IRON, RETICCTPCT in the last 72 hours. Urine analysis:    Component Value Date/Time   COLORURINE YELLOW 03/22/2017 [place] 03/22/2017 1650   LABSPEC 1.018 03/22/2017 1650   PHURINE 5.0 03/22/2017 1650   GLUCOSEU NEGATIVE 03/22/2017 1650   HGBUR NEGATIVE 03/22/2017 1650   BILIRUBINUR NEGATIVE 03/22/2017 1650   BILIRUBINUR n 09/25/2010 1525   KETONESUR NEGATIVE 03/22/2017 1650   PROTEINUR 30 (A) 03/22/2017 1650   UROBILINOGEN 0.2 09/25/2010 1525   UROBILINOGEN 0.2 01/04/2007 0900   NITRITE NEGATIVE 03/22/2017 1650   LEUKOCYTESUR NEGATIVE 03/22/2017 1650   Sepsis Labs: @LABRCNTIP (procalcitonin:4,lacticidven:4)  ) Recent Results (from the past 240 hour(s))  Respiratory Panel by PCR     Status: None   Collection Time: 03/15/17  2:28 AM  Result Value Ref Range Status   Adenovirus NOT DETECTED NOT DETECTED Final   Coronavirus 229E NOT DETECTED NOT DETECTED Final   Coronavirus HKU1 NOT DETECTED NOT DETECTED Final   Coronavirus NL63 NOT DETECTED NOT DETECTED Final   Coronavirus OC43 NOT DETECTED NOT DETECTED Final   Metapneumovirus NOT DETECTED NOT DETECTED Final   Rhinovirus / Enterovirus NOT DETECTED NOT DETECTED Final   Influenza A NOT DETECTED NOT DETECTED Final   Influenza B NOT DETECTED NOT DETECTED Final   Parainfluenza Virus 1 NOT DETECTED NOT DETECTED Final   Parainfluenza Virus 2 NOT DETECTED NOT DETECTED Final   Parainfluenza Virus 3 NOT DETECTED NOT DETECTED Final   Parainfluenza Virus 4 NOT DETECTED NOT DETECTED Final     Respiratory Syncytial Virus NOT DETECTED NOT DETECTED Final   Bordetella pertussis NOT DETECTED NOT DETECTED Final   Chlamydophila pneumoniae NOT DETECTED NOT DETECTED Final   Mycoplasma pneumoniae NOT DETECTED NOT DETECTED Final  Culture, blood (routine x 2) Call MD if unable to obtain prior to antibiotics being given     Status: None   Collection Time: 03/15/17  3:48 AM  Result Value Ref Range Status   Specimen Description BLOOD LEFT FOREARM  Final   Special Requests IN PEDIATRIC BOTTLE Blood Culture adequate volume  Final   Culture   Final    NO GROWTH 5 DAYS Performed at [facility], [address], [place], [place] [address]    Report Status 03/20/2017 FINAL  Final  Culture, blood (routine x 2) Call MD if unable to obtain prior to antibiotics being given     Status: None   Collection Time: 03/15/17  3:50 AM  Result Value Ref Range Status   Specimen Description BLOOD LEFT ANTECUBITAL  Final   Special Requests IN PEDIATRIC BOTTLE Blood Culture adequate volume  Final   Culture   Final    NO GROWTH 5 DAYS Performed at [facility], [place] [address] [place], [place]  [address]    Report Status 03/20/2017 FINAL  Final  MRSA PCR Screening     Status: None   Collection Time: 03/15/17  1:30 PM  Result Value Ref Range Status   MRSA by PCR NEGATIVE NEGATIVE Final    Comment:        The GeneXpert MRSA Assay (FDA approved for NASAL specimens only), is one component of a comprehensive MRSA colonization surveillance program. It is not intended to diagnose MRSA infection nor to guide or monitor treatment for MRSA infections.   Respiratory Panel by PCR     Status: None   Collection Time: 03/22/17 10:40 AM  Result Value Ref Range Status   Adenovirus NOT DETECTED NOT DETECTED Final   Coronavirus 229E NOT DETECTED NOT DETECTED Final   Coronavirus HKU1 NOT DETECTED NOT DETECTED Final   Coronavirus NL63 NOT DETECTED NOT DETECTED Final   Coronavirus OC43 NOT DETECTED NOT  DETECTED Final   Metapneumovirus NOT DETECTED NOT DETECTED Final   Rhinovirus / Enterovirus NOT DETECTED NOT DETECTED Final   Influenza A NOT DETECTED NOT DETECTED Final   Influenza B NOT DETECTED NOT DETECTED Final   Parainfluenza Virus 1 NOT DETECTED NOT DETECTED Final   Parainfluenza Virus 2 NOT DETECTED NOT DETECTED Final   Parainfluenza Virus 3 NOT DETECTED NOT DETECTED Final   Parainfluenza Virus 4 NOT DETECTED NOT DETECTED Final   Respiratory Syncytial Virus NOT DETECTED NOT DETECTED Final   Bordetella pertussis NOT DETECTED NOT DETECTED Final   Chlamydophila pneumoniae NOT DETECTED NOT DETECTED Final   Mycoplasma pneumoniae NOT DETECTED NOT DETECTED Final  MRSA PCR Screening     Status: None   Collection Time: 03/22/17  8:25 PM  Result Value Ref Range Status   MRSA by PCR NEGATIVE NEGATIVE Final    Comment:        The GeneXpert MRSA Assay (FDA approved for NASAL specimens only), is one component of a comprehensive MRSA colonization surveillance program. It is not intended to diagnose MRSA infection nor to guide or monitor treatment for MRSA infections. Performed at [facility], [place] [address], [place], [place] [address]          Radiology Studies: Dg Chest Portable 1 View  Result Date: 03/22/2017 CLINICAL DATA:  Shortness of Breath EXAM: PORTABLE CHEST 1 VIEW COMPARISON:  03/14/2017 FINDINGS: Heart and mediastinal contours are within normal limits. No focal opacities or effusions. No acute bony abnormality. IMPRESSION: No active disease. Electronically Signed   By: Rolm Baptise M.D.   On: 03/22/2017 08:56   Dg Swallowing Func-speech Pathology  Result Date: 03/23/2017 Objective Swallowing Evaluation: Type of Study: MBS-Modified Barium Swallow Study  Patient Details Name: [name] MRN: [id number] Date of Birth: [birth year] Today's Date: 03/23/2017 Time: SLP Start Time (ACUTE ONLY): 0941 -SLP Stop Time (ACUTE ONLY): 0952 SLP Time Calculation (min) (ACUTE ONLY): 11  min Past Medical History: Past Medical History: Diagnosis Date . Arthritis  . Asthma  . Breast cancer ([place]) 2006  right w/lumpectomy . COPD (chronic obstructive pulmonary disease) ([place])  . Depression  . Diabetes ([place]) 2007 . Diverticulosis of colon  . Endometrial hyperplasia 01/1996  fibroids, adenomyosis . Frequent UTI  . History of colon polyps  . Hypertension  . Insomnia  . Osteopenia 12/08  hip . Spinal stenosis 2001 . Vitamin D deficiency  Past Surgical History: Past Surgical History: Procedure Laterality Date . BILATERAL SALPINGOOPHORECTOMY  12/97 . BREAST LUMPECTOMY Right 03/2003 . CARPAL TUNNEL RELEASE   . CERVICAL  FUSION   . CERVICAL LAMINECTOMY  1975  x4 . CHOLECYSTECTOMY  11/00  lap chole . HYSTEROSCOPY  10/97  D&C, postmenopausal bleeding . LUMBAR LAMINECTOMY  10/06  with spinal stenosis . manipulation of hip for dislocation   . ROTATOR CUFF REPAIR Right 11/96 . TONSILLECTOMY AND ADENOIDECTOMY   . TOTAL ABDOMINAL HYSTERECTOMY  12/97  endo hyperplasia, fibroids, adenomyosis . TOTAL HIP ARTHROPLASTY Right 8/00 . TOTAL HIP REVISION Right 07/10/2015  Procedure: RIGHT ACETABULAR VS TOTAL HIP REVISION;  Surgeon: Gaynelle Arabian, MD;  Location: [facility];  Service: Orthopedics;  Laterality: Right; . TOTAL KNEE ARTHROPLASTY Left 06/2003 . TOTAL KNEE ARTHROPLASTY Right 10/06 . TUBAL LIGATION Bilateral 1968 HPI: Abia Monaco Metzis a 84 y.o.femalewith medical history significant ofasthma, arthritis, COPD, frequent UTIs, pna, cervical fusion, hypertension, spinal stenosis,diet-controlled diabetes, hypertension, depression with anxiety, breastcancer presenting with SOB.Per chart hospitalization 1/28-1/29 with aute on chronic respiratory failure. Prt MD differential dx COPD exacerbation with ? viral illness. Worse in last few days. CXR 2/4 No active disease. BSE 07/2016 pt reports ongoing symptoms of esophageal symptoms (Nexium didn't help). Esophagram 6//2018 Nonspecific esophageal motility disorder with tertiary  contractions in the mid and distal esophagus. No overt presbyesophagus. Reg/thin rec'd. MBS recommended after clinical observation and pt history.  No Data Recorded Assessment / Plan / Recommendation CHL IP CLINICAL IMPRESSIONS 03/23/2017 Clinical Impression Pt demonstrated typical swallow pattern of pt 84 years old with what appears to be significant COPD. Strong COPD-like cough prior to consuming po's. Normal flash penetration with thin and one instance of (mildmod) vallecular and pyriform sinus residue that she denied sensing but spontaneously swallowed to clear. Dyspnea witnessed with pt resting between several barium trials. MBS does not diagnose esophageal d/o but barium pill hesitated at GE junction but cleared with additional sips thin. Recommend eating when respiratory status stable and breaks when needed, regular texture, thin liquids sitting in upright position, small sips (reviewed with pt) and straws allowed and pills whole in applesauce followed by sips thin. No follow up needed at present.     SLP Visit Diagnosis Dysphagia, unspecified (R13.10) Attention and concentration deficit following -- Frontal lobe and executive function deficit following -- Impact on safety and function Mild aspiration risk;Moderate aspiration risk   CHL IP TREATMENT RECOMMENDATION 03/23/2017 Treatment Recommendations No treatment recommended at this time   Prognosis 08/09/2016 Prognosis for Safe Diet Advancement Fair Barriers to Reach Goals -- Barriers/Prognosis Comment -- CHL IP DIET RECOMMENDATION 03/23/2017 SLP Diet Recommendations Regular solids;Thin liquid Liquid Administration via Straw;Cup Medication Administration Whole meds with puree Compensations Slow rate;Small sips/bites Postural Changes Seated upright at 90 degrees   CHL IP OTHER RECOMMENDATIONS 03/23/2017 Recommended Consults -- Oral Care Recommendations Oral care BID Other Recommendations --   CHL IP FOLLOW UP RECOMMENDATIONS 03/23/2017 Follow up Recommendations None    CHL IP FREQUENCY AND DURATION 08/09/2016 Speech Therapy Frequency (ACUTE ONLY) min 2x/week Treatment Duration 1 week      CHL IP ORAL PHASE 03/23/2017 Oral Phase WFL Oral - Pudding Teaspoon -- Oral - Pudding Cup -- Oral - Honey Teaspoon -- Oral - Honey Cup -- Oral - Nectar Teaspoon -- Oral - Nectar Cup -- Oral - Nectar Straw -- Oral - Thin Teaspoon -- Oral - Thin Cup -- Oral - Thin Straw -- Oral - Puree -- Oral - Mech Soft -- Oral - Regular -- Oral - Multi-Consistency -- Oral - Pill -- Oral Phase - Comment --  CHL IP PHARYNGEAL PHASE 03/23/2017 Pharyngeal Phase WFL Pharyngeal- Pudding  Teaspoon -- Pharyngeal -- Pharyngeal- Pudding Cup -- Pharyngeal -- Pharyngeal- Honey Teaspoon -- Pharyngeal -- Pharyngeal- Honey Cup -- Pharyngeal -- Pharyngeal- Nectar Teaspoon -- Pharyngeal -- Pharyngeal- Nectar Cup -- Pharyngeal -- Pharyngeal- Nectar Straw -- Pharyngeal -- Pharyngeal- Thin Teaspoon -- Pharyngeal -- Pharyngeal- Thin Cup -- Pharyngeal -- Pharyngeal- Thin Straw -- Pharyngeal -- Pharyngeal- Puree -- Pharyngeal -- Pharyngeal- Mechanical Soft -- Pharyngeal -- Pharyngeal- Regular -- Pharyngeal -- Pharyngeal- Multi-consistency -- Pharyngeal -- Pharyngeal- Pill -- Pharyngeal -- Pharyngeal Comment --  CHL IP CERVICAL ESOPHAGEAL PHASE 03/23/2017 Cervical Esophageal Phase WFL Pudding Teaspoon -- Pudding Cup -- Honey Teaspoon -- Honey Cup -- Nectar Teaspoon -- Nectar Cup -- Nectar Straw -- Thin Teaspoon -- Thin Cup -- Thin Straw -- Puree -- Mechanical Soft -- Regular -- Multi-consistency -- Pill -- Cervical Esophageal Comment -- No flowsheet data found. [place] Siren 03/23/2017, 10:15 AM Orbie Pyo Colvin Caroli.Ed CCC-SLP Pager [phone]                   Scheduled Meds: . aspirin EC  325 mg Oral Daily  . buPROPion  300 mg Oral [id number]  . docusate sodium  100 mg Oral BID  . enoxaparin (LOVENOX) injection  40 mg Subcutaneous Q24H  . escitalopram  10 mg Oral Daily  . fluticasone  1 spray Each Nare BID  . gabapentin  300 mg  Oral TID  . ipratropium-albuterol  3 mL Nebulization TID  . mouth rinse  15 mL Mouth Rinse BID  . metoprolol tartrate  12.5 mg Oral BID  . mirabegron ER  50 mg Oral Daily  . montelukast  10 mg Oral QHS  . predniSONE  40 mg Oral Q supper   Continuous Infusions: . lactated ringers 50 mL/hr at 03/22/17 2259  . piperacillin-tazobactam (ZOSYN)  IV 3.375 g (03/23/17 1446)     LOS: 1 day    Time spent: 40 minutes.    Dana Allan, MD  [facility] Pager #: [phone] 7PM-7AM contact night coverage as above

## 2017-03-23 NOTE — Evaluation (Signed)
Occupational Therapy Evaluation Patient Details Name: [name] MRN: [id number] DOB: 1930-10-09 Today's Date: 03/23/2017    History of Present Illness Pt is an 84 y.o. female who was admitted from [facility] 03/22/17 with acute on chronic respiratory failure associated with a COPD exacerbation. Recent admission for similar symptoms 1/28-29. She has a PMH significant for arthritis, asthma, breast cancer, COPD, depression, diabetes, diverticulosis of colon, endometrial hyperplasia, frequent UTI, hypertension, history of colon polyps, insomnia, osteopenia, spinal stenosis, vitamin D deficiency.    Clinical Impression   PTA, pt was at [facility] for rehabilitation and was working on ambulation with RW but primarily utilizes wheelchair for mobility. She reports being able to transfer to and from w/c without assistance and completing UB ADL independently. She requires assistance for LB ADL and has been unable to shower since she has been there. Pt currently requires max assist for LB ADL and toileting hygiene and min assist for UB ADL and toilet transfers. Pt reports feeling uncoordinated and "wobbly" during session. She did have elevated BP (152/97) and unable to maintain O2 saturations >90% on RA. Able to maintain SpO2 >95% with 2L O2 via [place] reapplied. Recommend return to SNF post-acute D/C to continue rehabilitation. Will continue to follow while admitted.    Follow Up Recommendations  SNF;Supervision/Assistance - 24 hour    Equipment Recommendations  Other (comment)(defer to next venue of care)    Recommendations for Other Services       Precautions / Restrictions Precautions Precautions: Fall Restrictions Weight Bearing Restrictions: No      Mobility Bed Mobility Overal bed mobility: Needs Assistance Bed Mobility: Supine to Sit     Supine to sit: Min guard     General bed mobility comments: Min guard assist for safety and significantly increased time.    Transfers Overall transfer level: Needs assistance Equipment used: Rolling walker (2 wheeled) Transfers: Sit to/from Stand Sit to Stand: Min assist Stand pivot transfers: Min assist       General transfer comment: Min assist to power up to standing.     Balance Overall balance assessment: History of Falls;Needs assistance Sitting-balance support: Feet supported;No upper extremity supported Sitting balance-Leahy Scale: Fair     Standing balance support: Bilateral upper extremity supported Standing balance-Leahy Scale: Poor Standing balance comment: Relies on B UE support for standing.                            ADL either performed or assessed with clinical judgement   ADL Overall ADL's : Needs assistance/impaired Eating/Feeding: Set up;Sitting   Grooming: Set up;Sitting   Upper Body Bathing: Minimal assistance;Sitting   Lower Body Bathing: Maximal assistance;Sit to/from stand   Upper Body Dressing : Minimal assistance;Sitting   Lower Body Dressing: Maximal assistance;Sit to/from stand   Toilet Transfer: Minimal assistance;Stand-pivot;RW Toilet Transfer Details (indicate cue type and reason): Simulated from bed to chair. Able to take a few shuffling, pivotal steps.  Toileting- Clothing Manipulation and Hygiene: Maximal assistance;Sit to/from stand       Functional mobility during ADLs: Minimal assistance;Rolling walker General ADL Comments: Pt reports feeling uncoordinated and "wobbly."     Vision Baseline Vision/History: Wears glasses Wears Glasses: (does not have them with her) Vision Assessment?: No apparent visual deficits     Perception     Praxis      Pertinent Vitals/Pain Pain Assessment: 0-10 Pain Score: 5  Pain Location: head Pain Descriptors / Indicators: Aching Pain Intervention(s):  Monitored during session     Hand Dominance     Extremity/Trunk Assessment Upper Extremity Assessment Upper Extremity Assessment: Generalized  weakness   Lower Extremity Assessment Lower Extremity Assessment: Generalized weakness       Communication Communication Communication: No difficulties   Cognition Arousal/Alertness: Awake/alert Behavior During Therapy: WFL for tasks assessed/performed Overall Cognitive Status: No family/caregiver present to determine baseline cognitive functioning                                 General Comments: Decreased awareness and executive functioning. No family present to determine baseline. Does report that her head still hurts from a fall that happened over a month ago.    General Comments  Pt with SpO2 96% on 2L O2 via [place]. Removed when seated at EOB and O2 desaturation to 89% noted. Reapplied 2L O2 with encouragement for pursed lip breathing and rebound to 96%.    Exercises     Shoulder Instructions      Home Living Family/patient expects to be discharged to:: Inpatient rehab                             Home Equipment: Wheelchair - manual;Walker - 2 wheels          Prior Functioning/Environment Level of Independence: Needs assistance  Gait / Transfers Assistance Needed: Has been working on ambulation at [facility] with therapy.  ADL's / Homemaking Assistance Needed: Requires assistance for LB and has not been able to shower. Does UB ADL with set-up.    Comments: States she has been able to transfer herself to and from w/c. However, does not self-propel her wheelchair.         OT Problem List: Decreased strength;Decreased activity tolerance;Impaired balance (sitting and/or standing);Decreased safety awareness;Decreased knowledge of use of DME or AE;Decreased coordination;Decreased knowledge of precautions;Cardiopulmonary status limiting activity;Pain      OT Treatment/Interventions: Self-care/ADL training;Therapeutic exercise;Neuromuscular education;Energy conservation;DME and/or AE instruction;Therapeutic activities;Cognitive  remediation/compensation;Visual/perceptual remediation/compensation;Patient/family education;Balance training    OT Goals(Current goals can be found in the care plan section) Acute Rehab OT Goals Patient Stated Goal: Back to [facility]F for rehab OT Goal Formulation: With patient Time For Goal Achievement: 04/06/17 Potential to Achieve Goals: Good  OT Frequency: Min 2X/week   Barriers to D/C:            Co-evaluation              AM-PAC PT "6 Clicks" Daily Activity     Outcome Measure Help from another person eating meals?: A Little Help from another person taking care of personal grooming?: A Little Help from another person toileting, which includes using toliet, bedpan, or urinal?: A Lot Help from another person bathing (including washing, rinsing, drying)?: A Lot Help from another person to put on and taking off regular upper body clothing?: A Little Help from another person to put on and taking off regular lower body clothing?: A Lot 6 Click Score: 15   End of Session Equipment Utilized During Treatment: Gait belt;Rolling walker Nurse Communication: Mobility status  Activity Tolerance: Patient tolerated treatment well Patient left: in chair;with call bell/phone within reach;with chair alarm set  OT Visit Diagnosis: Unsteadiness on feet (R26.81);Muscle weakness (generalized) (M62.81);Pain Pain - part of body: (head)                Time: 1448-1856 OT Time Calculation (  min): 33 min Charges:  OT General Charges $OT Visit: 1 Visit OT Evaluation $OT Eval Moderate Complexity: 1 Mod OT Treatments $Self Care/Home Management : 8-22 mins G-Codes:     Norman Herrlich, MS OTR/L  Pager: Riley A Tyreque Finken 03/23/2017, 1:45 PM

## 2017-03-23 NOTE — Clinical Social Work Note (Signed)
Clinical Social Work Assessment  Patient Details  Name: [name] MRN: [id number] Date of Birth: Sep 09, 1930  Date of referral:  03/23/17               Reason for consult:  Facility Placement                Permission sought to share information with:  Facility Art therapist granted to share information::  Yes, Verbal Permission Granted  Name::        Agency::  SNF  Relationship::     Contact Information:     Housing/Transportation Living arrangements for the past 2 months:  [place], [place] of Information:  Patient Patient Interpreter Needed:  None Criminal Activity/Legal Involvement Pertinent to Current Situation/Hospitalization:  No - Comment as needed Significant Relationships:  Adult Children Lives with:  Facility Resident Do you feel safe going back to the place where you live?  No Need for family participation in patient care:  Yes (Comment)  Care giving concerns:  None at this time- was at SNF for short term rehab and plans to return at DC.   Social Worker assessment / plan:  CSW spoke with pt concerning plan for time of DC.  Pt confirmed she was at [facility] for SNF for past couple of days when she went into respiratory distress and required coming back to the hospital.  Employment status:  Retired Nurse, adult PT Recommendations:  [place] / Referral to community resources:  [place]  Patient/Family's Response to care:  Patient is very realistic about level of needs at this time and agreeable to returning to [facility] with hope to return to ALF if she gets stronger.  Patient/Family's Understanding of and Emotional Response to Diagnosis, Current Treatment, and Prognosis:  No questions or concerns at this time- hopeful that she will return to being more independent but considering alternative options if she can't  Emotional  Assessment Appearance:  Appears stated age Attitude/Demeanor/Rapport:    Affect (typically observed):  Appropriate, Pleasant Orientation:  Oriented to Self, Oriented to Place, Oriented to  Time, Oriented to Situation Alcohol / Substance use:  Not Applicable Psych involvement (Current and /or in the community):  No (Comment)  Discharge Needs  Concerns to be addressed:  Care Coordination Readmission within the last 30 days:  Yes Current discharge risk:  Physical Impairment Barriers to Discharge:  Continued Medical Work up   Jorge Ny, LCSW 03/23/2017, 4:20 PM

## 2017-03-23 NOTE — Evaluation (Signed)
Clinical/Bedside Swallow Evaluation Patient Details  Name: [name] MRN: [id number] Date of Birth: 12/09/1930  Today's Date: 03/23/2017 Time: SLP Start Time (ACUTE ONLY): 1660 SLP Stop Time (ACUTE ONLY): 0826 SLP Time Calculation (min) (ACUTE ONLY): 14 min  Past Medical History:  Past Medical History:  Diagnosis Date  . Arthritis   . Asthma   . Breast cancer ([place]) 2006   right w/lumpectomy  . COPD (chronic obstructive pulmonary disease) ([place])   . Depression   . Diabetes ([place]) 2007  . Diverticulosis of colon   . Endometrial hyperplasia 01/1996   fibroids, adenomyosis  . Frequent UTI   . History of colon polyps   . Hypertension   . Insomnia   . Osteopenia 12/08   hip  . Spinal stenosis 2001  . Vitamin D deficiency    Past Surgical History:  Past Surgical History:  Procedure Laterality Date  . BILATERAL SALPINGOOPHORECTOMY  12/97  . BREAST LUMPECTOMY Right 03/2003  . CARPAL TUNNEL RELEASE    . CERVICAL FUSION    . CERVICAL LAMINECTOMY  1975   x4  . CHOLECYSTECTOMY  11/00   lap chole  . HYSTEROSCOPY  10/97   D&C, postmenopausal bleeding  . LUMBAR LAMINECTOMY  10/06   with spinal stenosis  . manipulation of hip for dislocation    . ROTATOR CUFF REPAIR Right 11/96  . TONSILLECTOMY AND ADENOIDECTOMY    . TOTAL ABDOMINAL HYSTERECTOMY  12/97   endo hyperplasia, fibroids, adenomyosis  . TOTAL HIP ARTHROPLASTY Right 8/00  . TOTAL HIP REVISION Right 07/10/2015   Procedure: RIGHT ACETABULAR VS TOTAL HIP REVISION;  Surgeon: Gaynelle Arabian, MD;  Location: [facility];  Service: Orthopedics;  Laterality: Right;  . TOTAL KNEE ARTHROPLASTY Left 06/2003  . TOTAL KNEE ARTHROPLASTY Right 10/06  . TUBAL LIGATION Bilateral 1968   HPI:  [name] a 84 y.o.femalewith medical history significant ofasthma, arthritis, COPD, frequent UTIs, pna, cervical fusion, hypertension, spinal stenosis,diet-controlled diabetes, hypertension, depression with anxiety, breastcancer presenting  with SOB.Per chart hospitalization 1/28-1/29 with aute on chronic respiratory failure. Prt MD differential dx COPD exacerbation with ? viral illness. Worse in last few days. CXR 2/4 No active disease. BSE 07/2016 pt reports ongoing symptoms of esophageal symptoms (Nexium didn't help). Esophagram 6//2018 Nonspecific esophageal motility disorder with tertiary contractions in the mid and distal esophagus. No overt presbyesophagus. Reg/thin rec'd.   Assessment / Plan / Recommendation Clinical Impression  Pt demonstrates indications of pharyngeal dysphagia with immediate and delayed cough with consecutive sips water. Unable to consume fulll 3 oz at once. Coughing difficult to distinguish from baseline cough with COPD, aspiration or diagnosed esophageal dysmotility disorder. Pt agreeable to MBS schedule this morning around 0845.  SLP Visit Diagnosis: Dysphagia, unspecified (R13.10)    Aspiration Risk  Mild aspiration risk;Moderate aspiration risk    Diet Recommendation (can complete pudding and cup water until MBS)   Liquid Administration via: Cup Medication Administration: Whole meds with puree Supervision: Patient able to self feed Compensations: Slow rate;Small sips/bites Postural Changes: Seated upright at 90 degrees    Other  Recommendations Oral Care Recommendations: Oral care BID   Follow up Recommendations        Frequency and Duration            Prognosis        Swallow Study   General HPI: [name] a 84 y.o.femalewith medical history significant ofasthma, arthritis, COPD, frequent UTIs, pna, cervical fusion, hypertension, spinal stenosis,diet-controlled diabetes, hypertension, depression with  anxiety, breastcancer presenting with SOB.Per chart hospitalization 1/28-1/29 with aute on chronic respiratory failure. Prt MD differential dx COPD exacerbation with ? viral illness. Worse in last few days. CXR 2/4 No active disease. BSE 07/2016 pt reports ongoing symptoms  of esophageal symptoms (Nexium didn't help). Esophagram 6//2018 Nonspecific esophageal motility disorder with tertiary contractions in the mid and distal esophagus. No overt presbyesophagus. Reg/thin rec'd. Type of Study: Bedside Swallow Evaluation Previous Swallow Assessment: (esophagram, not MBS) Diet Prior to this Study: NPO Temperature Spikes Noted: No Respiratory Status: Nasal cannula History of Recent Intubation: No Behavior/Cognition: Alert;Cooperative Oral Cavity Assessment: Within Functional Limits Oral Care Completed by SLP: No Oral Cavity - Dentition: Adequate natural dentition Vision: Functional for self-feeding Self-Feeding Abilities: Able to feed self Patient Positioning: Upright in bed Baseline Vocal Quality: Normal Volitional Cough: Strong Volitional Swallow: Able to elicit    Oral/Motor/Sensory Function Overall Oral Motor/Sensory Function: Within functional limits   Ice Chips Ice chips: Not tested   Thin Liquid Thin Liquid: Impaired Presentation: Cup Pharyngeal  Phase Impairments: Cough - Immediate;Cough - Delayed    Nectar Thick Nectar Thick Liquid: Not tested   Honey Thick Honey Thick Liquid: Not tested   Puree Puree: Impaired Presentation: Spoon;Self Fed Pharyngeal Phase Impairments: Cough - Delayed   Solid   GO   Solid: Not tested        [place] Siren 03/23/2017,8:36 AM  Orbie Pyo Colvin Caroli.Ed [employer] [phone]

## 2017-03-23 NOTE — NC FL2 (Signed)
[place] LEVEL OF CARE SCREENING TOOL     IDENTIFICATION  Patient Name: [name] Birthdate: 1930/03/27 Sex: female Admission Date (Current Location): 03/22/2017  [facility] and [place] Number:  Herbalist and Address:  [facility], [place] [address], [place], [place] [address]      Provider Number: [id number]  Attending Physician Name and Address:  Bonnell Public, MD  Relative Name and Phone Number:       Current Level of Care: Hospital Recommended Level of Care: [place] Prior Approval Number:    Date Approved/Denied:   PASRR Number:    Discharge Plan: SNF    Current Diagnoses: Patient Active Problem List   Diagnosis Date Noted  . Laceration of forehead   . Acute on chronic respiratory failure with hypoxia ([place]) 03/15/2017  . Fall 03/15/2017  . Depression with anxiety 03/15/2017  . COPD exacerbation ([place]) 03/15/2017  . COPD with acute exacerbation ([place])   . Acute lower UTI   . Sepsis ([place]) 11/08/2016  . Morbid obesity due to excess calories ([place]) 09/02/2016  . Esophageal dysfunction 09/01/2016  . PNA (pneumonia) 08/09/2016  . Wheelchair bound 08/09/2016  . Wound healing, delayed 08/09/2016  . HCAP (healthcare-associated pneumonia) 07/07/2016  . Chronic ulcer of right leg ([place]) 07/07/2016  . SIRS (systemic inflammatory response syndrome) ([place]) 05/08/2016  . Community acquired pneumonia 05/08/2016  . Failed total hip arthroplasty ([place]) 07/10/2015  . Breast cancer, right breast ([place]) 07/18/2014  . CONCUSSION WITH LOC OF UNSPECIFIED DURATION 07/19/2009  . OTH SYMPTOMS INVOLVING RESPIRATORY SYSTEM&CHEST 11/12/2008  . UNSPECIFIED DISORDER OF ANKLE AND FOOT JOINT 05/11/2008  . CERUMEN IMPACTION, BILATERAL 05/04/2008  . Dysmetabolic syndrome X 93/26/7124  . UNS ADVRS EFF UNS RX MEDICINAL&BIOLOGICAL SBSTNC 08/12/2007  . Other and unspecified hyperlipidemia 04/01/2007  . INSOMNIA, PERSISTENT  09/09/2006  . Essential hypertension 09/09/2006  . DIVERTICULOSIS, COLON 09/09/2006  . Osteoarthritis 09/09/2006  . Spinal stenosis 09/09/2006  . COLONIC POLYPS, HX OF 09/09/2006    Orientation RESPIRATION BLADDER Height & Weight     Self, Time, Situation, Place  O2(2L [place]) Incontinent, External catheter Weight: 192 lb 14.4 oz (87.5 kg) Height:     BEHAVIORAL SYMPTOMS/MOOD NEUROLOGICAL BOWEL NUTRITION STATUS      Continent Diet(see DC summary)  AMBULATORY STATUS COMMUNICATION OF NEEDS Skin   Extensive Assist Verbally Normal                       Personal Care Assistance Level of Assistance  Bathing, Dressing Bathing Assistance: Limited assistance Feeding assistance: Limited assistance Dressing Assistance: Limited assistance     Functional Limitations Info             SPECIAL CARE FACTORS FREQUENCY  PT (By licensed PT), OT (By licensed OT)     PT Frequency: 5/wk OT Frequency: 5/wk            Contractures      Additional Factors Info  Code Status, Allergies, Psychotropic Code Status Info: DNR Allergies Info: Oxycodone Hcl Oxycodone Hcl, Morphine Sulfate, Sulfamethoxazole Psychotropic Info: wellbutrin   Isolation Precautions Info: MRSA     Current Medications (03/23/2017):  This is the current hospital active medication list Current Facility-Administered Medications  Medication Dose Route Frequency Provider Last Rate Last Dose  . acetaminophen (TYLENOL) tablet 650 mg  650 mg Oral Q6H PRN Karmen Bongo, MD       Or  . acetaminophen (TYLENOL) suppository 650  mg  650 mg Rectal Q6H PRN Karmen Bongo, MD      . albuterol (PROVENTIL) (2.5 MG/3ML) 0.083% nebulizer solution 2.5 mg  2.5 mg Nebulization Q1H PRN Karmen Bongo, MD      . aspirin EC tablet 325 mg  325 mg Oral Daily Karmen Bongo, MD   325 mg at 03/23/17 1013  . benzonatate (TESSALON) capsule 200 mg  200 mg Oral TID PRN Karmen Bongo, MD      . buPROPion (WELLBUTRIN XL) 24 hr tablet 300 mg   300 mg Oral Ledell Noss, MD   300 mg at 03/23/17 1013  . clonazePAM (KLONOPIN) tablet 1 mg  1 mg Oral QHS PRN Karmen Bongo, MD      . docusate sodium (COLACE) capsule 100 mg  100 mg Oral BID Karmen Bongo, MD   100 mg at 03/23/17 1013  . enoxaparin (LOVENOX) injection 40 mg  40 mg Subcutaneous Q24H Karmen Bongo, MD   40 mg at 03/22/17 2258  . escitalopram (LEXAPRO) tablet 10 mg  10 mg Oral Daily Karmen Bongo, MD   10 mg at 03/23/17 1013  . fluticasone (FLONASE) 50 MCG/ACT nasal spray 1 spray  1 spray Each Nare BID Karmen Bongo, MD   1 spray at 03/23/17 1013  . gabapentin (NEURONTIN) capsule 300 mg  300 mg Oral TID Karmen Bongo, MD   300 mg at 03/23/17 1013  . HYDROcodone-acetaminophen (NORCO/VICODIN) 5-325 MG per tablet 1 tablet  1 tablet Oral BID PRN Karmen Bongo, MD   1 tablet at 03/23/17 1020  . ipratropium-albuterol (DUONEB) 0.5-2.5 (3) MG/3ML nebulizer solution 3 mL  3 mL Nebulization TID Karmen Bongo, MD   3 mL at 03/23/17 1429  . lactated ringers infusion   Intravenous Continuous Karmen Bongo, MD 50 mL/hr at 03/22/17 2259    . MEDLINE mouth rinse  15 mL Mouth Rinse BID Dana Allan I, MD   15 mL at 03/23/17 1447  . menthol-cetylpyridinium (CEPACOL) lozenge 3 mg  1 lozenge Oral Q4H PRN Karmen Bongo, MD      . methocarbamol (ROBAXIN) tablet 500 mg  500 mg Oral Q6H PRN Karmen Bongo, MD      . metoprolol tartrate (LOPRESSOR) tablet 12.5 mg  12.5 mg Oral BID Karmen Bongo, MD   12.5 mg at 03/23/17 1013  . mirabegron ER (MYRBETRIQ) tablet 50 mg  50 mg Oral Daily Karmen Bongo, MD   50 mg at 03/23/17 1013  . montelukast (SINGULAIR) tablet 10 mg  10 mg Oral QHS Karmen Bongo, MD      . ondansetron [facility]) tablet 4 mg  4 mg Oral Q6H PRN Karmen Bongo, MD       Or  . ondansetron [facility]) injection 4 mg  4 mg Intravenous Q6H PRN Karmen Bongo, MD      . piperacillin-tazobactam (ZOSYN) IVPB 3.375 g  3.375 g Intravenous Lynne Logan, MD  12.5 mL/hr at 03/23/17 1446 3.375 g at 03/23/17 1446  . polyethylene glycol (MIRALAX / GLYCOLAX) packet 17 g  17 g Oral Daily PRN Karmen Bongo, MD      . predniSONE (DELTASONE) tablet 40 mg  40 mg Oral Q supper Karmen Bongo, MD      . senna [facility]) tablet 8.6 mg  1 tablet Oral Daily PRN Karmen Bongo, MD      . sodium phosphate (FLEET) 7-19 GM/118ML enema 1 enema  1 enema Rectal Daily PRN Karmen Bongo, MD         Discharge Medications:  Please see discharge summary for a list of discharge medications.  Relevant Imaging Results:  Relevant Lab Results:   Additional Information SS#: [id number]. From [facility].  Jorge Ny, LCSW

## 2017-03-24 DIAGNOSIS — J9621 Acute and chronic respiratory failure with hypoxia: Principal | ICD-10-CM

## 2017-03-24 DIAGNOSIS — J441 Chronic obstructive pulmonary disease with (acute) exacerbation: Secondary | ICD-10-CM

## 2017-03-24 DIAGNOSIS — F418 Other specified anxiety disorders: Secondary | ICD-10-CM

## 2017-03-24 DIAGNOSIS — I10 Essential (primary) hypertension: Secondary | ICD-10-CM

## 2017-03-24 MED ORDER — GUAIFENESIN ER 600 MG PO TB12: 600.0000 mg | ORAL_TABLET | Freq: Two times a day (BID) | ORAL | 2 refills | Status: AC

## 2017-03-24 MED ORDER — ALBUTEROL SULFATE 0.63 MG/3ML IN NEBU: 1.0000 | INHALATION_SOLUTION | Freq: Four times a day (QID) | RESPIRATORY_TRACT | 12 refills | Status: AC

## 2017-03-24 MED ORDER — ALBUTEROL SULFATE (2.5 MG/3ML) 0.083% IN NEBU: 2.5000 mg | INHALATION_SOLUTION | RESPIRATORY_TRACT | 12 refills | Status: DC | PRN

## 2017-03-24 MED ORDER — POLYVINYL ALCOHOL 1.4 % OP SOLN
1.0000 [drp] | Freq: Two times a day (BID) | OPHTHALMIC | Status: DC
  Administered 2017-03-24: 1 [drp] via OPHTHALMIC
  Filled 2017-03-24: qty 15

## 2017-03-24 MED ORDER — HYDROCODONE-ACETAMINOPHEN 5-325 MG PO TABS: 1.0000 | ORAL_TABLET | Freq: Two times a day (BID) | ORAL | 0 refills | Status: DC | PRN

## 2017-03-24 MED ORDER — LORATADINE 10 MG PO TABS
10.0000 mg | ORAL_TABLET | Freq: Every day | ORAL | Status: DC
  Administered 2017-03-24: 10 mg via ORAL
  Filled 2017-03-24: qty 1

## 2017-03-24 NOTE — Progress Notes (Signed)
Patient belongings retrieved from security. Stored belongings consisted of one gold necklace. Necklace placed around patient's neck per her request.

## 2017-03-24 NOTE — Discharge Summary (Signed)
Physician Discharge Summary  [name] BJS:[id number] DOB: 1930-03-10 DOA: 03/22/2017  PCP: Reynold Bowen, MD  Admit date: 03/22/2017 Discharge date: 03/24/2017  Admitted From: Skilled nursing facility Disposition: Same  Recommendations for Outpatient Follow-up:  1. Continue follow-up of cough/congestion  Discharge Condition:  Stable CODE STATUS:   DNR   Consultations:  None   Discharge Diagnoses:  Principal Problem:   Acute on chronic respiratory failure with hypoxia ([place]) Active Problems:   COPD with acute exacerbation (HCC)   Depression with anxiety   Essential hypertension   Subjective: The patient states that her breathing has improved.  She still has a cough but is not able to bring the mucus up.  No complaints of chest pain.  She admits to postnasal drip which she states is quite significant and is probably causing her to continue coughing.  Brief Summary: Patient is an 84 year old female with past medical history significant for asthma, arthritis, COPD, frequent UTIs, hypertension, spinal stenosis, diet-controlled diabetes mellitus, hypertension, depression, anxiety, breast cancer status post right lumpectomy.    She presented for a cough and shortness of breath.    She admitted to coughing up yellow-green sputum.  Initially she required a BiPAP in the ER and was subsequently admitted for a COPD exacerbation with possible acute tracheobronchitis.  Hospital Course:  Acute failure secondary to acute bronchitis COPD exacerbation -She has been weaned off of oxygen onto room air with a resting pulse ox of 95% -Her lungs are clear on exam she continues to have a moderate cough which sounds quite congested -She also complains that she has a postnasal drip which may be precipitating her cough-she is already on Flonase, Singulair and steroids-I have added Claritin - because of ongoing cough, she underwent a modified barium swallow which did not reveal any aspiration -Recommend  further treatment of cough with twice daily Mucinex and Tessalon - - She will be discharged back to the nursing home  Hypertension -She takes metoprolol as outpatient which has been continued    Discharge Exam: Vitals:   03/24/17 0904 03/24/17 1015  BP:    Pulse:  87  Resp:    Temp:    SpO2: 96%    Vitals:   03/24/17 0430 03/24/17 0436 03/24/17 0904 03/24/17 1015  BP: 138/69     Pulse: (!) 59   87  Resp: 18     Temp: 99.1 F (37.3 C)     TempSrc: Oral     SpO2: 98% 100% 96%   Weight:        General: Pt is alert, awake, not in acute distress Cardiovascular: RRR, S1/S2 +, no rubs, no gallops Respiratory: CTA bilaterally, no wheezing, no rhonchi- has a cough which sounds congested Abdominal: Soft, NT, ND, bowel sounds + Extremities: no edema, no cyanosis   Discharge Instructions   Allergies as of 03/24/2017      Reactions   Oxycodone Hcl [oxycodone Hcl] Rash   Morphine Sulfate Other (See Comments)   REACTION: very anxious   Sulfamethoxazole Other (See Comments)   REACTION: unspecified      Medication List    STOP taking these medications   azithromycin 250 MG tablet Commonly known as:  ZITHROMAX     TAKE these medications   acetaminophen 325 MG tablet Commonly known as:  TYLENOL Take 2 tablets (650 mg total) by mouth every 6 (six) hours as needed for mild pain (or Fever >/= 101).   ADVIL 200 MG tablet Generic drug:  ibuprofen Take  200 mg by mouth every 12 (twelve) hours as needed (pain).   aspirin EC 325 MG tablet Take 325 mg by mouth daily.   benzonatate 200 MG capsule Commonly known as:  TESSALON Take 200 mg by mouth 3 (three) times daily as needed for cough.   buPROPion 300 MG 24 hr tablet Commonly known as:  WELLBUTRIN XL Take 300 mg by mouth every morning.   CALCIUM+D3 600-800 MG-UNIT Tabs Generic drug:  Calcium Carb-Cholecalciferol Take 1 tablet by mouth daily.   CEPACOL SORE THROAT 15-2.6 MG Lozg Generic drug:  Benzocaine-Menthol Use  as directed 1 lozenge in the mouth or throat every 4 (four) hours as needed (for sore throat).   clonazePAM 1 MG tablet Commonly known as:  KLONOPIN Take 1 tablet (1 mg total) by mouth at bedtime as needed (insomnia). What changed:  reasons to take this   docusate sodium 100 MG capsule Commonly known as:  COLACE Take 100 mg by mouth daily.   DULERA 200-5 MCG/ACT Aero Generic drug:  mometasone-formoterol Inhale 1 puff into the lungs 2 (two) times daily.   escitalopram 10 MG tablet Commonly known as:  LEXAPRO Take 10 mg by mouth daily.   fluticasone 50 MCG/ACT nasal spray Commonly known as:  FLONASE Place 1 spray into both nostrils 2 (two) times daily.   gabapentin 300 MG capsule Commonly known as:  NEURONTIN TAKE ONE CAPSULE THREE TIMES DAILY What changed:  See the new instructions.   guaiFENesin 600 MG 12 hr tablet Commonly known as:  MUCINEX Take 1 tablet (600 mg total) by mouth 2 (two) times daily.   HYDROcodone-acetaminophen 5-325 MG tablet Commonly known as:  NORCO/VICODIN Take 1 tablet by mouth 2 (two) times daily as needed for moderate pain.   ipratropium-albuterol 0.5-2.5 (3) MG/3ML Soln Commonly known as:  DUONEB Take 3 mLs by nebulization every 6 (six) hours as needed. What changed:  reasons to take this   methocarbamol 500 MG tablet Commonly known as:  ROBAXIN Take 1 tablet (500 mg total) by mouth every 6 (six) hours as needed for muscle spasms.   metoprolol tartrate 25 MG tablet Commonly known as:  LOPRESSOR Take 0.5 tablets (12.5 mg total) by mouth 2 (two) times daily.   montelukast 10 MG tablet Commonly known as:  SINGULAIR Take 10 mg by mouth at bedtime.   MYRBETRIQ 50 MG Tb24 tablet Generic drug:  mirabegron ER Take 50 mg by mouth daily.   ondansetron 4 MG tablet Commonly known as:  ZOFRAN Take 4 mg by mouth every 8 (eight) hours as needed for nausea or vomiting.   polyethylene glycol packet Commonly known as:  MIRALAX / GLYCOLAX Take 17 g  by mouth daily as needed for mild constipation.   predniSONE 10 MG tablet Commonly known as:  DELTASONE Take 60 mg tomorrow, 40 mg for 2 days, 20 mg for 2 days and 10 mg the last 2 days   PROAIR HFA 108 (90 Base) MCG/ACT inhaler Generic drug:  albuterol Inhale 2 puffs into the lungs every 4 (four) hours as needed for shortness of breath. What changed:    Another medication with the same name was added. Make sure you understand how and when to take each.  Another medication with the same name was changed. Make sure you understand how and when to take each.   albuterol 0.63 MG/3ML nebulizer solution Commonly known as:  ACCUNEB Take 3 mLs (0.63 mg total) by nebulization 4 (four) times daily. What changed:  when to  take this   albuterol (2.5 MG/3ML) 0.083% nebulizer solution Commonly known as:  PROVENTIL Take 3 mLs (2.5 mg total) by nebulization every hour as needed for shortness of breath. What changed:  You were already taking a medication with the same name, and this prescription was added. Make sure you understand how and when to take each.   Propylene Glycol-Glycerin 0.6-0.6 % Soln Commonly known as:  SOOTHE Apply 1 drop to eye 2 (two) times daily.   senna 8.6 MG Tabs tablet Commonly known as:  SENOKOT Take 1 tablet by mouth daily as needed for mild constipation.   sodium phosphate 7-19 GM/118ML Enem Place 1 enema rectally daily as needed for severe constipation.       Allergies  Allergen Reactions  . Oxycodone Hcl [Oxycodone Hcl] Rash  . Morphine Sulfate Other (See Comments)    REACTION: very anxious  . Sulfamethoxazole Other (See Comments)    REACTION: unspecified     Procedures/Studies:  Dg Chest Portable 1 View  Result Date: 03/22/2017 CLINICAL DATA:  Shortness of Breath EXAM: PORTABLE CHEST 1 VIEW COMPARISON:  03/14/2017 FINDINGS: Heart and mediastinal contours are within normal limits. No focal opacities or effusions. No acute bony abnormality. IMPRESSION:  No active disease. Electronically Signed   By: Rolm Baptise M.D.   On: 03/22/2017 08:56   Dg Chest Port 1 View  Result Date: 03/14/2017 CLINICAL DATA:  Shortness of breath, fall. EXAM: PORTABLE CHEST 1 VIEW COMPARISON:  Chest x-ray dated 11/09/2016. FINDINGS: Heart size and mediastinal contours are within normal limits. Lungs are clear. No pleural effusion or pneumothorax seen. Osseous structures about the chest are unremarkable. IMPRESSION: No active disease. Electronically Signed   By: Franki Cabot M.D.   On: 03/14/2017 23:38   Dg Swallowing Func-speech Pathology  Result Date: 03/23/2017 Objective Swallowing Evaluation: Type of Study: MBS-Modified Barium Swallow Study  Patient Details Name: [name] MRN: [id number] Date of Birth: 1930-04-01 Today's Date: 03/23/2017 Time: SLP Start Time (ACUTE ONLY): 0941 -SLP Stop Time (ACUTE ONLY): 0952 SLP Time Calculation (min) (ACUTE ONLY): 11 min Past Medical History: Past Medical History: Diagnosis Date . Arthritis  . Asthma  . Breast cancer ([place]) 2006  right w/lumpectomy . COPD (chronic obstructive pulmonary disease) ([place])  . Depression  . Diabetes ([place]) 2007 . Diverticulosis of colon  . Endometrial hyperplasia 01/1996  fibroids, adenomyosis . Frequent UTI  . History of colon polyps  . Hypertension  . Insomnia  . Osteopenia 12/08  hip . Spinal stenosis 2001 . Vitamin D deficiency  Past Surgical History: Past Surgical History: Procedure Laterality Date . BILATERAL SALPINGOOPHORECTOMY  12/97 . BREAST LUMPECTOMY Right 03/2003 . CARPAL TUNNEL RELEASE   . CERVICAL FUSION   . CERVICAL LAMINECTOMY  1975  x4 . CHOLECYSTECTOMY  11/00  lap chole . HYSTEROSCOPY  10/97  D&C, postmenopausal bleeding . LUMBAR LAMINECTOMY  10/06  with spinal stenosis . manipulation of hip for dislocation   . ROTATOR CUFF REPAIR Right 11/96 . TONSILLECTOMY AND ADENOIDECTOMY   . TOTAL ABDOMINAL HYSTERECTOMY  12/97  endo hyperplasia, fibroids, adenomyosis . TOTAL HIP ARTHROPLASTY Right 8/00 . TOTAL  HIP REVISION Right 07/10/2015  Procedure: RIGHT ACETABULAR VS TOTAL HIP REVISION;  Surgeon: Gaynelle Arabian, MD;  Location: [facility];  Service: Orthopedics;  Laterality: Right; . TOTAL KNEE ARTHROPLASTY Left 06/2003 . TOTAL KNEE ARTHROPLASTY Right 10/06 . TUBAL LIGATION Bilateral 1968 HPI: Dante Cooter Metzis a 84 y.o.femalewith medical history significant ofasthma, arthritis, COPD, frequent UTIs, pna, cervical fusion, hypertension,  spinal stenosis,diet-controlled diabetes, hypertension, depression with anxiety, breastcancer presenting with SOB.Per chart hospitalization 1/28-1/29 with aute on chronic respiratory failure. Prt MD differential dx COPD exacerbation with ? viral illness. Worse in last few days. CXR 2/4 No active disease. BSE 07/2016 pt reports ongoing symptoms of esophageal symptoms (Nexium didn't help). Esophagram 6//2018 Nonspecific esophageal motility disorder with tertiary contractions in the mid and distal esophagus. No overt presbyesophagus. Reg/thin rec'd. MBS recommended after clinical observation and pt history.  No Data Recorded Assessment / Plan / Recommendation CHL IP CLINICAL IMPRESSIONS 03/23/2017 Clinical Impression Pt demonstrated typical swallow pattern of pt 84 years old with what appears to be significant COPD. Strong COPD-like cough prior to consuming po's. Normal flash penetration with thin and one instance of (mildmod) vallecular and pyriform sinus residue that she denied sensing but spontaneously swallowed to clear. Dyspnea witnessed with pt resting between several barium trials. MBS does not diagnose esophageal d/o but barium pill hesitated at GE junction but cleared with additional sips thin. Recommend eating when respiratory status stable and breaks when needed, regular texture, thin liquids sitting in upright position, small sips (reviewed with pt) and straws allowed and pills whole in applesauce followed by sips thin. No follow up needed at present.     SLP Visit Diagnosis  Dysphagia, unspecified (R13.10) Attention and concentration deficit following -- Frontal lobe and executive function deficit following -- Impact on safety and function Mild aspiration risk;Moderate aspiration risk   CHL IP TREATMENT RECOMMENDATION 03/23/2017 Treatment Recommendations No treatment recommended at this time   Prognosis 08/09/2016 Prognosis for Safe Diet Advancement Fair Barriers to Reach Goals -- Barriers/Prognosis Comment -- CHL IP DIET RECOMMENDATION 03/23/2017 SLP Diet Recommendations Regular solids;Thin liquid Liquid Administration via Straw;Cup Medication Administration Whole meds with puree Compensations Slow rate;Small sips/bites Postural Changes Seated upright at 90 degrees   CHL IP OTHER RECOMMENDATIONS 03/23/2017 Recommended Consults -- Oral Care Recommendations Oral care BID Other Recommendations --   CHL IP FOLLOW UP RECOMMENDATIONS 03/23/2017 Follow up Recommendations None   CHL IP FREQUENCY AND DURATION 08/09/2016 Speech Therapy Frequency (ACUTE ONLY) min 2x/week Treatment Duration 1 week      CHL IP ORAL PHASE 03/23/2017 Oral Phase WFL Oral - Pudding Teaspoon -- Oral - Pudding Cup -- Oral - Honey Teaspoon -- Oral - Honey Cup -- Oral - Nectar Teaspoon -- Oral - Nectar Cup -- Oral - Nectar Straw -- Oral - Thin Teaspoon -- Oral - Thin Cup -- Oral - Thin Straw -- Oral - Puree -- Oral - Mech Soft -- Oral - Regular -- Oral - Multi-Consistency -- Oral - Pill -- Oral Phase - Comment --  CHL IP PHARYNGEAL PHASE 03/23/2017 Pharyngeal Phase WFL Pharyngeal- Pudding Teaspoon -- Pharyngeal -- Pharyngeal- Pudding Cup -- Pharyngeal -- Pharyngeal- Honey Teaspoon -- Pharyngeal -- Pharyngeal- Honey Cup -- Pharyngeal -- Pharyngeal- Nectar Teaspoon -- Pharyngeal -- Pharyngeal- Nectar Cup -- Pharyngeal -- Pharyngeal- Nectar Straw -- Pharyngeal -- Pharyngeal- Thin Teaspoon -- Pharyngeal -- Pharyngeal- Thin Cup -- Pharyngeal -- Pharyngeal- Thin Straw -- Pharyngeal -- Pharyngeal- Puree -- Pharyngeal -- Pharyngeal- Mechanical  Soft -- Pharyngeal -- Pharyngeal- Regular -- Pharyngeal -- Pharyngeal- Multi-consistency -- Pharyngeal -- Pharyngeal- Pill -- Pharyngeal -- Pharyngeal Comment --  CHL IP CERVICAL ESOPHAGEAL PHASE 03/23/2017 Cervical Esophageal Phase WFL Pudding Teaspoon -- Pudding Cup -- Honey Teaspoon -- Honey Cup -- Nectar Teaspoon -- Nectar Cup -- Nectar Straw -- Thin Teaspoon -- Thin Cup -- Thin Straw -- Puree -- Mechanical Soft -- Regular -- Multi-consistency --  Pill -- Cervical Esophageal Comment -- No flowsheet data found. [place] Siren 03/23/2017, 10:15 AM Orbie Pyo Colvin Caroli.Ed CCC-SLP Pager [phone]                 The results of significant diagnostics from this hospitalization (including imaging, microbiology, ancillary and laboratory) are listed below for reference.     Microbiology: Recent Results (from the past 240 hour(s))  Respiratory Panel by PCR     Status: None   Collection Time: 03/15/17  2:28 AM  Result Value Ref Range Status   Adenovirus NOT DETECTED NOT DETECTED Final   Coronavirus 229E NOT DETECTED NOT DETECTED Final   Coronavirus HKU1 NOT DETECTED NOT DETECTED Final   Coronavirus NL63 NOT DETECTED NOT DETECTED Final   Coronavirus OC43 NOT DETECTED NOT DETECTED Final   Metapneumovirus NOT DETECTED NOT DETECTED Final   Rhinovirus / Enterovirus NOT DETECTED NOT DETECTED Final   Influenza A NOT DETECTED NOT DETECTED Final   Influenza B NOT DETECTED NOT DETECTED Final   Parainfluenza Virus 1 NOT DETECTED NOT DETECTED Final   Parainfluenza Virus 2 NOT DETECTED NOT DETECTED Final   Parainfluenza Virus 3 NOT DETECTED NOT DETECTED Final   Parainfluenza Virus 4 NOT DETECTED NOT DETECTED Final   Respiratory Syncytial Virus NOT DETECTED NOT DETECTED Final   Bordetella pertussis NOT DETECTED NOT DETECTED Final   Chlamydophila pneumoniae NOT DETECTED NOT DETECTED Final   Mycoplasma pneumoniae NOT DETECTED NOT DETECTED Final  Culture, blood (routine x 2) Call MD if unable to obtain prior  to antibiotics being given     Status: None   Collection Time: 03/15/17  3:48 AM  Result Value Ref Range Status   Specimen Description BLOOD LEFT FOREARM  Final   Special Requests IN PEDIATRIC BOTTLE Blood Culture adequate volume  Final   Culture   Final    NO GROWTH 5 DAYS Performed at [facility], [address] [place], [place] [address]    Report Status 03/20/2017 FINAL  Final  Culture, blood (routine x 2) Call MD if unable to obtain prior to antibiotics being given     Status: None   Collection Time: 03/15/17  3:50 AM  Result Value Ref Range Status   Specimen Description BLOOD LEFT ANTECUBITAL  Final   Special Requests IN PEDIATRIC BOTTLE Blood Culture adequate volume  Final   Culture   Final    NO GROWTH 5 DAYS Performed at [facility], [place] [address] [place], [place] [address]    Report Status 03/20/2017 FINAL  Final  MRSA PCR Screening     Status: None   Collection Time: 03/15/17  1:30 PM  Result Value Ref Range Status   MRSA by PCR NEGATIVE NEGATIVE Final    Comment:        The GeneXpert MRSA Assay (FDA approved for NASAL specimens only), is one component of a comprehensive MRSA colonization surveillance program. It is not intended to diagnose MRSA infection nor to guide or monitor treatment for MRSA infections.   Respiratory Panel by PCR     Status: None   Collection Time: 03/22/17 10:40 AM  Result Value Ref Range Status   Adenovirus NOT DETECTED NOT DETECTED Final   Coronavirus 229E NOT DETECTED NOT DETECTED Final   Coronavirus HKU1 NOT DETECTED NOT DETECTED Final   Coronavirus NL63 NOT DETECTED NOT DETECTED Final   Coronavirus OC43 NOT DETECTED NOT DETECTED Final   Metapneumovirus NOT DETECTED NOT DETECTED Final  Rhinovirus / Enterovirus NOT DETECTED NOT DETECTED Final   Influenza A NOT DETECTED NOT DETECTED Final   Influenza B NOT DETECTED NOT DETECTED Final   Parainfluenza Virus 1 NOT DETECTED NOT DETECTED Final   Parainfluenza Virus 2  NOT DETECTED NOT DETECTED Final   Parainfluenza Virus 3 NOT DETECTED NOT DETECTED Final   Parainfluenza Virus 4 NOT DETECTED NOT DETECTED Final   Respiratory Syncytial Virus NOT DETECTED NOT DETECTED Final   Bordetella pertussis NOT DETECTED NOT DETECTED Final   Chlamydophila pneumoniae NOT DETECTED NOT DETECTED Final   Mycoplasma pneumoniae NOT DETECTED NOT DETECTED Final  MRSA PCR Screening     Status: None   Collection Time: 03/22/17  8:25 PM  Result Value Ref Range Status   MRSA by PCR NEGATIVE NEGATIVE Final    Comment:        The GeneXpert MRSA Assay (FDA approved for NASAL specimens only), is one component of a comprehensive MRSA colonization surveillance program. It is not intended to diagnose MRSA infection nor to guide or monitor treatment for MRSA infections. Performed at [facility], [place] [address], [place], [place] [address]      Labs: BNP (last 3 results) Recent Labs    07/07/16 0844 11/08/16 0855 03/22/17 0829  BNP 103.1* 50.8 63.0   Basic Metabolic Panel: Recent Labs  Lab 03/22/17 0941 03/23/17 0357  NA 138 141  K 3.8 4.4  CL 98* 99*  CO2 28 30  GLUCOSE 142* 142*  BUN 9 11  CREATININE 0.72 0.63  CALCIUM 9.0 9.5   Liver Function Tests: No results for input(s): AST, ALT, ALKPHOS, BILITOT, PROT, ALBUMIN in the last 168 hours. No results for input(s): LIPASE, AMYLASE in the last 168 hours. No results for input(s): AMMONIA in the last 168 hours. CBC: Recent Labs  Lab 03/22/17 0941 03/23/17 0357  WBC 7.5 5.8  NEUTROABS 6.8  --   HGB 15.5* 15.1*  HCT 47.6* 45.2  MCV 98.6 98.0  PLT 154 153   Cardiac Enzymes: Recent Labs  Lab 03/22/17 0941  TROPONINI <0.03   BNP: Invalid input(s): POCBNP CBG: No results for input(s): GLUCAP in the last 168 hours. D-Dimer No results for input(s): DDIMER in the last 72 hours. Hgb A1c No results for input(s): HGBA1C in the last 72 hours. Lipid Profile No results for input(s): CHOL, HDL,  LDLCALC, TRIG, CHOLHDL, LDLDIRECT in the last 72 hours. Thyroid function studies No results for input(s): TSH, T4TOTAL, T3FREE, THYROIDAB in the last 72 hours.  Invalid input(s): FREET3 Anemia work up No results for input(s): VITAMINB12, FOLATE, FERRITIN, TIBC, IRON, RETICCTPCT in the last 72 hours. Urinalysis    Component Value Date/Time   COLORURINE YELLOW 03/22/2017 1650   APPEARANCEUR CLEAR 03/22/2017 1650   LABSPEC 1.018 03/22/2017 1650   PHURINE 5.0 03/22/2017 1650   GLUCOSEU NEGATIVE 03/22/2017 1650   HGBUR NEGATIVE 03/22/2017 1650   BILIRUBINUR NEGATIVE 03/22/2017 1650   BILIRUBINUR n 09/25/2010 1525   KETONESUR NEGATIVE 03/22/2017 1650   PROTEINUR 30 (A) 03/22/2017 1650   UROBILINOGEN 0.2 09/25/2010 1525   UROBILINOGEN 0.2 01/04/2007 0900   NITRITE NEGATIVE 03/22/2017 1650   LEUKOCYTESUR NEGATIVE 03/22/2017 1650   Sepsis Labs Invalid input(s): PROCALCITONIN,  WBC,  LACTICIDVEN Microbiology Recent Results (from the past 240 hour(s))  Respiratory Panel by PCR     Status: None   Collection Time: 03/15/17  2:28 AM  Result Value Ref Range Status   Adenovirus NOT DETECTED NOT DETECTED Final   Coronavirus  229E NOT DETECTED NOT DETECTED Final   Coronavirus HKU1 NOT DETECTED NOT DETECTED Final   Coronavirus NL63 NOT DETECTED NOT DETECTED Final   Coronavirus OC43 NOT DETECTED NOT DETECTED Final   Metapneumovirus NOT DETECTED NOT DETECTED Final   Rhinovirus / Enterovirus NOT DETECTED NOT DETECTED Final   Influenza A NOT DETECTED NOT DETECTED Final   Influenza B NOT DETECTED NOT DETECTED Final   Parainfluenza Virus 1 NOT DETECTED NOT DETECTED Final   Parainfluenza Virus 2 NOT DETECTED NOT DETECTED Final   Parainfluenza Virus 3 NOT DETECTED NOT DETECTED Final   Parainfluenza Virus 4 NOT DETECTED NOT DETECTED Final   Respiratory Syncytial Virus NOT DETECTED NOT DETECTED Final   Bordetella pertussis NOT DETECTED NOT DETECTED Final   Chlamydophila pneumoniae NOT DETECTED NOT  DETECTED Final   Mycoplasma pneumoniae NOT DETECTED NOT DETECTED Final  Culture, blood (routine x 2) Call MD if unable to obtain prior to antibiotics being given     Status: None   Collection Time: 03/15/17  3:48 AM  Result Value Ref Range Status   Specimen Description BLOOD LEFT FOREARM  Final   Special Requests IN PEDIATRIC BOTTLE Blood Culture adequate volume  Final   Culture   Final    NO GROWTH 5 DAYS Performed at [facility], [address], [place], [place] [address]    Report Status 03/20/2017 FINAL  Final  Culture, blood (routine x 2) Call MD if unable to obtain prior to antibiotics being given     Status: None   Collection Time: 03/15/17  3:50 AM  Result Value Ref Range Status   Specimen Description BLOOD LEFT ANTECUBITAL  Final   Special Requests IN PEDIATRIC BOTTLE Blood Culture adequate volume  Final   Culture   Final    NO GROWTH 5 DAYS Performed at [facility], [place] [address], [place], [place] [address]    Report Status 03/20/2017 FINAL  Final  MRSA PCR Screening     Status: None   Collection Time: 03/15/17  1:30 PM  Result Value Ref Range Status   MRSA by PCR NEGATIVE NEGATIVE Final    Comment:        The GeneXpert MRSA Assay (FDA approved for NASAL specimens only), is one component of a comprehensive MRSA colonization surveillance program. It is not intended to diagnose MRSA infection nor to guide or monitor treatment for MRSA infections.   Respiratory Panel by PCR     Status: None   Collection Time: 03/22/17 10:40 AM  Result Value Ref Range Status   Adenovirus NOT DETECTED NOT DETECTED Final   Coronavirus 229E NOT DETECTED NOT DETECTED Final   Coronavirus HKU1 NOT DETECTED NOT DETECTED Final   Coronavirus NL63 NOT DETECTED NOT DETECTED Final   Coronavirus OC43 NOT DETECTED NOT DETECTED Final   Metapneumovirus NOT DETECTED NOT DETECTED Final   Rhinovirus / Enterovirus NOT DETECTED NOT DETECTED Final   Influenza A NOT DETECTED NOT DETECTED  Final   Influenza B NOT DETECTED NOT DETECTED Final   Parainfluenza Virus 1 NOT DETECTED NOT DETECTED Final   Parainfluenza Virus 2 NOT DETECTED NOT DETECTED Final   Parainfluenza Virus 3 NOT DETECTED NOT DETECTED Final   Parainfluenza Virus 4 NOT DETECTED NOT DETECTED Final   Respiratory Syncytial Virus NOT DETECTED NOT DETECTED Final   Bordetella pertussis NOT DETECTED NOT DETECTED Final   Chlamydophila pneumoniae NOT DETECTED NOT DETECTED Final   Mycoplasma pneumoniae NOT DETECTED NOT DETECTED Final  MRSA  PCR Screening     Status: None   Collection Time: 03/22/17  8:25 PM  Result Value Ref Range Status   MRSA by PCR NEGATIVE NEGATIVE Final    Comment:        The GeneXpert MRSA Assay (FDA approved for NASAL specimens only), is one component of a comprehensive MRSA colonization surveillance program. It is not intended to diagnose MRSA infection nor to guide or monitor treatment for MRSA infections. Performed at [facility], [place] [address] [place], [place] [address]      Time coordinating discharge: Over 30 minutes  SIGNED:   Debbe Odea, MD  [facility] 03/24/2017, 12:41 PM Pager   If 7PM-7AM, please contact night-coverage [email or web address] Password [email or web address]

## 2017-03-24 NOTE — Progress Notes (Signed)
Physical Therapy Treatment Patient Details Name: [name] MRN: [id number] DOB: [birth year] Today's Date: 03/24/2017    History of Present Illness Pt is an 84 y.o. female who was admitted from [facility] 03/22/17 with acute on chronic respiratory failure associated with a COPD exacerbation. Recent admission for similar symptoms 1/28-29. She has a PMH significant for arthritis, asthma, breast cancer, COPD, depression, diabetes, diverticulosis of colon, endometrial hyperplasia, frequent UTI, hypertension, history of colon polyps, insomnia, osteopenia, spinal stenosis, vitamin D deficiency.     PT Comments    Pt very participative and encouraged.  Emphasis on sit to stand, standing balance and gait stability/tolerance.   Follow Up Recommendations  SNF     Equipment Recommendations  None recommended by PT    Recommendations for Other Services       Precautions / Restrictions Precautions Precautions: Fall    Mobility  Bed Mobility               General bed mobility comments: pt up in chair on arrival  Transfers Overall transfer level: Needs assistance Equipment used: Rolling walker (2 wheeled) Transfers: Sit to/from Stand Sit to Stand: Min assist         General transfer comment: boost assist and stability once standing to bring forward over her BOS  Ambulation/Gait Ambulation/Gait assistance: Mod assist Ambulation Distance (Feet): 18 Feet Assistive device: Rolling walker (2 wheeled) Gait Pattern/deviations: Step-through pattern;Decreased step length - right;Decreased step length - left;Decreased stride length;Trendelenburg Gait velocity: slower Gait velocity interpretation: Below normal speed for age/gender General Gait Details: significant left hip drop.  pt blames her leg length discrepancy, but a large portion of the problem is R hip weakness   Stairs            Wheelchair Mobility    Modified Rankin (Stroke Patients Only)       Balance  Overall balance assessment: History of Falls;Needs assistance   Sitting balance-Leahy Scale: Fair(or better)     Standing balance support: Bilateral upper extremity supported Standing balance-Leahy Scale: Poor Standing balance comment: Relies on B UE support for standing.                             Cognition Arousal/Alertness: Awake/alert Behavior During Therapy: WFL for tasks assessed/performed Overall Cognitive Status: Within Functional Limits for tasks assessed                                        Exercises      General Comments General comments (skin integrity, edema, etc.): sats on RA running low 90's during mobility.      Pertinent Vitals/Pain Pain Assessment: Faces Faces Pain Scale: No hurt    Home Living                      Prior Function            PT Goals (current goals can now be found in the care plan section) Acute Rehab PT Goals PT Goal Formulation: With patient Time For Goal Achievement: 03/29/17 Potential to Achieve Goals: Good    Frequency    Min 3X/week      PT Plan Current plan remains appropriate    Co-evaluation              AM-PAC PT "6 Clicks" Daily Activity  Outcome Measure  Difficulty turning over in bed (including adjusting bedclothes, sheets and blankets)?: A Little Difficulty moving from lying on back to sitting on the side of the bed? : A Little Difficulty sitting down on and standing up from a chair with arms (e.g., wheelchair, bedside commode, etc,.)?: Unable Help needed moving to and from a bed to chair (including a wheelchair)?: A Little Help needed walking in hospital room?: A Lot Help needed climbing 3-5 steps with a railing? : Total 6 Click Score: 13    End of Session   Activity Tolerance: Patient limited by fatigue Patient left: in bed;with call bell/phone within reach;with family/visitor present;Other (comment)(sitting EOB) Nurse Communication: Mobility status PT  Visit Diagnosis: Unsteadiness on feet (R26.81);Other abnormalities of gait and mobility (R26.89);History of falling (Z91.81);Muscle weakness (generalized) (M62.81)     Time: 1884-1660 PT Time Calculation (min) (ACUTE ONLY): 28 min  Charges:  $Gait Training: 8-22 mins $Therapeutic Activity: 8-22 mins                    G Codes:       03/31/2017  Donnella Sham, PT [phone]  (pager)   Tessie Fass Batul Diego 03-31-17, 12:38 PM

## 2017-03-24 NOTE — Progress Notes (Signed)
Report called to [place], receiving RN at [employer]. All questions answered.

## 2017-03-24 NOTE — Care Management Note (Signed)
Case Management Note  Patient Details  Name: [name] MRN: [id number] Date of Birth: [birth year]  Subjective/Objective:  Pt presented for Acute on Chronic Respiratory Failure. Plan for SNF @ [facility]- CSW assisting with disposition needs.                 Action/Plan: No further needs from CM at this time.    Expected Discharge Date:  03/24/17               Expected Discharge Plan:  Skilled Nursing Facility  In-House Referral:  Clinical Social Work  Discharge planning Services  CM Consult  Post Acute Care Choice:  NA Choice offered to:  NA  DME Arranged:  N/A DME Agency:  NA  HH Arranged:  NA HH Agency:  NA  Status of Service:  Completed, signed off  If discussed at [employer] of Stay Meetings, dates discussed:    Additional Comments:  Bethena Roys, RN 03/24/2017, 1:51 PM

## 2017-03-24 NOTE — Progress Notes (Signed)
Patient will discharge to [facility] Anticipated discharge date: 2/6 Family notified: pt son Transportation by [facility]- called at 2:45pm Report #: [phone]- ask for RN for room 3221  Pinch signing off.  Jorge Ny, LCSW Clinical Social Worker [phone]

## 2017-04-01 ENCOUNTER — Inpatient Hospital Stay (HOSPITAL_COMMUNITY)
Admission: EM | Admit: 2017-04-01 | Discharge: 2017-04-04 | DRG: 175 | Disposition: A | Discharge status: Skilled Nursing Facility | Payer: Medicare Other | Attending: Family Medicine | Admitting: Family Medicine

## 2017-04-01 ENCOUNTER — Emergency Department (HOSPITAL_COMMUNITY): Payer: Medicare Other

## 2017-04-01 ENCOUNTER — Encounter (HOSPITAL_COMMUNITY): Payer: Self-pay

## 2017-04-01 ENCOUNTER — Other Ambulatory Visit: Payer: Self-pay

## 2017-04-01 DIAGNOSIS — Z9049 Acquired absence of other specified parts of digestive tract: Secondary | ICD-10-CM

## 2017-04-01 DIAGNOSIS — F419 Anxiety disorder, unspecified: Secondary | ICD-10-CM | POA: Diagnosis present

## 2017-04-01 DIAGNOSIS — Z7951 Long term (current) use of inhaled steroids: Secondary | ICD-10-CM

## 2017-04-01 DIAGNOSIS — E873 Alkalosis: Secondary | ICD-10-CM

## 2017-04-01 DIAGNOSIS — R0902 Hypoxemia: Secondary | ICD-10-CM | POA: Diagnosis not present

## 2017-04-01 DIAGNOSIS — I2609 Other pulmonary embolism with acute cor pulmonale: Secondary | ICD-10-CM | POA: Diagnosis present

## 2017-04-01 DIAGNOSIS — Z90710 Acquired absence of both cervix and uterus: Secondary | ICD-10-CM | POA: Diagnosis not present

## 2017-04-01 DIAGNOSIS — Z7982 Long term (current) use of aspirin: Secondary | ICD-10-CM | POA: Diagnosis not present

## 2017-04-01 DIAGNOSIS — Z96653 Presence of artificial knee joint, bilateral: Secondary | ICD-10-CM | POA: Diagnosis present

## 2017-04-01 DIAGNOSIS — J9601 Acute respiratory failure with hypoxia: Secondary | ICD-10-CM

## 2017-04-01 DIAGNOSIS — I2510 Atherosclerotic heart disease of native coronary artery without angina pectoris: Secondary | ICD-10-CM | POA: Diagnosis present

## 2017-04-01 DIAGNOSIS — L299 Pruritus, unspecified: Secondary | ICD-10-CM | POA: Diagnosis present

## 2017-04-01 DIAGNOSIS — Z96641 Presence of right artificial hip joint: Secondary | ICD-10-CM | POA: Diagnosis present

## 2017-04-01 DIAGNOSIS — F329 Major depressive disorder, single episode, unspecified: Secondary | ICD-10-CM | POA: Diagnosis present

## 2017-04-01 DIAGNOSIS — Z853 Personal history of malignant neoplasm of breast: Secondary | ICD-10-CM | POA: Diagnosis not present

## 2017-04-01 DIAGNOSIS — Z86010 Personal history of colonic polyps: Secondary | ICD-10-CM

## 2017-04-01 DIAGNOSIS — G4700 Insomnia, unspecified: Secondary | ICD-10-CM | POA: Diagnosis present

## 2017-04-01 DIAGNOSIS — Z87440 Personal history of urinary (tract) infections: Secondary | ICD-10-CM | POA: Diagnosis not present

## 2017-04-01 DIAGNOSIS — I10 Essential (primary) hypertension: Secondary | ICD-10-CM | POA: Diagnosis present

## 2017-04-01 DIAGNOSIS — I2699 Other pulmonary embolism without acute cor pulmonale: Secondary | ICD-10-CM | POA: Diagnosis not present

## 2017-04-01 DIAGNOSIS — G629 Polyneuropathy, unspecified: Secondary | ICD-10-CM | POA: Diagnosis present

## 2017-04-01 DIAGNOSIS — I361 Nonrheumatic tricuspid (valve) insufficiency: Secondary | ICD-10-CM | POA: Diagnosis not present

## 2017-04-01 DIAGNOSIS — R7303 Prediabetes: Secondary | ICD-10-CM | POA: Diagnosis present

## 2017-04-01 DIAGNOSIS — Z981 Arthrodesis status: Secondary | ICD-10-CM

## 2017-04-01 DIAGNOSIS — Z87891 Personal history of nicotine dependence: Secondary | ICD-10-CM

## 2017-04-01 DIAGNOSIS — Z79899 Other long term (current) drug therapy: Secondary | ICD-10-CM | POA: Diagnosis not present

## 2017-04-01 DIAGNOSIS — J449 Chronic obstructive pulmonary disease, unspecified: Secondary | ICD-10-CM

## 2017-04-01 DIAGNOSIS — I2602 Saddle embolus of pulmonary artery with acute cor pulmonale: Secondary | ICD-10-CM | POA: Diagnosis not present

## 2017-04-01 LAB — I-STAT VENOUS BLOOD GAS, ED
ACID-BASE EXCESS: 5 mmol/L — AB (ref 0.0–2.0)
Bicarbonate: 27.6 mmol/L (ref 20.0–28.0)
O2 SAT: 99 %
PCO2 VEN: 34.6 mmHg — AB (ref 44.0–60.0)
TCO2: 29 mmol/L (ref 22–32)
pH, Ven: 7.511 — ABNORMAL HIGH (ref 7.250–7.430)
pO2, Ven: 127 mmHg — ABNORMAL HIGH (ref 32.0–45.0)

## 2017-04-01 LAB — URINALYSIS, ROUTINE W REFLEX MICROSCOPIC
BACTERIA UA: NONE SEEN
Bilirubin Urine: NEGATIVE
Glucose, UA: NEGATIVE mg/dL
KETONES UR: NEGATIVE mg/dL
Nitrite: NEGATIVE
PH: 7 (ref 5.0–8.0)
PROTEIN: NEGATIVE mg/dL
Specific Gravity, Urine: 1.013 (ref 1.005–1.030)

## 2017-04-01 LAB — CBC
HEMATOCRIT: 46.5 % — AB (ref 36.0–46.0)
Hemoglobin: 15.2 g/dL — ABNORMAL HIGH (ref 12.0–15.0)
MCH: 31.7 pg (ref 26.0–34.0)
MCHC: 32.7 g/dL (ref 30.0–36.0)
MCV: 97.1 fL (ref 78.0–100.0)
Platelets: 127 10*3/uL — ABNORMAL LOW (ref 150–400)
RBC: 4.79 MIL/uL (ref 3.87–5.11)
RDW: 13.9 % (ref 11.5–15.5)
WBC: 12.3 10*3/uL — AB (ref 4.0–10.5)

## 2017-04-01 LAB — BASIC METABOLIC PANEL
Anion gap: 9 (ref 5–15)
BUN: 12 mg/dL (ref 6–20)
CO2: 25 mmol/L (ref 22–32)
CREATININE: 0.66 mg/dL (ref 0.44–1.00)
Calcium: 9.1 mg/dL (ref 8.9–10.3)
Chloride: 102 mmol/L (ref 101–111)
GFR calc Af Amer: >60 mL/min (ref >60)
GFR calc non Af Amer: >60 mL/min (ref >60)
GLUCOSE: 134 mg/dL — AB (ref 65–99)
POTASSIUM: 4.5 mmol/L (ref 3.5–5.1)
SODIUM: 136 mmol/L (ref 135–145)

## 2017-04-01 LAB — I-STAT TROPONIN, ED: Troponin i, poc: 0.05 ng/mL (ref 0.00–0.08)

## 2017-04-01 LAB — INFLUENZA PANEL BY PCR (TYPE A & B)
INFLAPCR: NEGATIVE
INFLBPCR: NEGATIVE

## 2017-04-01 MED ORDER — CLONAZEPAM 0.5 MG PO TABS
1.0000 mg | ORAL_TABLET | Freq: Every evening | ORAL | Status: DC | PRN
  Administered 2017-04-02 (×2): 1 mg via ORAL
  Filled 2017-04-01 (×2): qty 2

## 2017-04-01 MED ORDER — IOPAMIDOL (ISOVUE-370) INJECTION 76%
INTRAVENOUS | Status: AC
  Administered 2017-04-01: 100 mL
  Filled 2017-04-01: qty 100

## 2017-04-01 MED ORDER — ONDANSETRON HCL 4 MG PO TABS: 4.0000 mg | ORAL_TABLET | Freq: Four times a day (QID) | ORAL | Status: DC | PRN

## 2017-04-01 MED ORDER — HYDROMORPHONE HCL 1 MG/ML IJ SOLN
0.5000 mg | Freq: Once | INTRAMUSCULAR | Status: AC
  Administered 2017-04-01: 0.5 mg via INTRAVENOUS
  Filled 2017-04-01: qty 1

## 2017-04-01 MED ORDER — FLUTICASONE PROPIONATE 50 MCG/ACT NA SUSP
1.0000 | Freq: Two times a day (BID) | NASAL | Status: DC
  Administered 2017-04-02 – 2017-04-04 (×4): 1 via NASAL
  Filled 2017-04-01 (×2): qty 16

## 2017-04-01 MED ORDER — ONDANSETRON HCL 4 MG/2ML IJ SOLN: 4.0000 mg | Freq: Four times a day (QID) | INTRAMUSCULAR | Status: DC | PRN

## 2017-04-01 MED ORDER — ALBUTEROL SULFATE (2.5 MG/3ML) 0.083% IN NEBU
5.0000 mg | INHALATION_SOLUTION | Freq: Once | RESPIRATORY_TRACT | Status: AC
  Administered 2017-04-01: 5 mg via RESPIRATORY_TRACT
  Filled 2017-04-01: qty 6

## 2017-04-01 MED ORDER — MONTELUKAST SODIUM 10 MG PO TABS
10.0000 mg | ORAL_TABLET | Freq: Every day | ORAL | Status: DC
  Administered 2017-04-02 – 2017-04-03 (×3): 10 mg via ORAL
  Filled 2017-04-01 (×4): qty 1

## 2017-04-01 MED ORDER — ESCITALOPRAM OXALATE 10 MG PO TABS
10.0000 mg | ORAL_TABLET | Freq: Every day | ORAL | Status: DC
  Administered 2017-04-02 – 2017-04-04 (×3): 10 mg via ORAL
  Filled 2017-04-01 (×3): qty 1

## 2017-04-01 MED ORDER — SODIUM CHLORIDE 0.9% FLUSH
3.0000 mL | Freq: Two times a day (BID) | INTRAVENOUS | Status: DC
  Administered 2017-04-03 – 2017-04-04 (×3): 3 mL via INTRAVENOUS

## 2017-04-01 MED ORDER — HEPARIN BOLUS VIA INFUSION
4500.0000 [IU] | Freq: Once | INTRAVENOUS | Status: AC
  Administered 2017-04-01: 4500 [IU] via INTRAVENOUS
  Filled 2017-04-01: qty 4500

## 2017-04-01 MED ORDER — HYDROMORPHONE HCL 1 MG/ML IJ SOLN: 0.5000 mg | INTRAMUSCULAR | Status: DC | PRN

## 2017-04-01 MED ORDER — METHYLPREDNISOLONE SODIUM SUCC 125 MG IJ SOLR
125.0000 mg | Freq: Once | INTRAMUSCULAR | Status: AC
  Administered 2017-04-01: 125 mg via INTRAVENOUS
  Filled 2017-04-01: qty 2

## 2017-04-01 MED ORDER — DOCUSATE SODIUM 100 MG PO CAPS
100.0000 mg | ORAL_CAPSULE | Freq: Every day | ORAL | Status: DC
  Administered 2017-04-02 – 2017-04-04 (×2): 100 mg via ORAL
  Filled 2017-04-01 (×2): qty 1

## 2017-04-01 MED ORDER — IPRATROPIUM BROMIDE 0.02 % IN SOLN
0.5000 mg | Freq: Once | RESPIRATORY_TRACT | Status: AC
  Administered 2017-04-01: 0.5 mg via RESPIRATORY_TRACT
  Filled 2017-04-01: qty 2.5

## 2017-04-01 MED ORDER — FUROSEMIDE 10 MG/ML IJ SOLN
40.0000 mg | Freq: Once | INTRAMUSCULAR | Status: AC
  Administered 2017-04-01: 40 mg via INTRAVENOUS
  Filled 2017-04-01: qty 4

## 2017-04-01 MED ORDER — ACETAMINOPHEN 650 MG RE SUPP: 650.0000 mg | Freq: Four times a day (QID) | RECTAL | Status: DC | PRN

## 2017-04-01 MED ORDER — GABAPENTIN 300 MG PO CAPS
300.0000 mg | ORAL_CAPSULE | Freq: Three times a day (TID) | ORAL | Status: DC
  Administered 2017-04-02 – 2017-04-04 (×9): 300 mg via ORAL
  Filled 2017-04-01: qty 1
  Filled 2017-04-01: qty 3
  Filled 2017-04-01 (×3): qty 1
  Filled 2017-04-01: qty 3
  Filled 2017-04-01 (×3): qty 1

## 2017-04-01 MED ORDER — MOMETASONE FURO-FORMOTEROL FUM 200-5 MCG/ACT IN AERO
1.0000 | INHALATION_SPRAY | Freq: Two times a day (BID) | RESPIRATORY_TRACT | Status: DC
  Administered 2017-04-02 – 2017-04-04 (×4): 1 via RESPIRATORY_TRACT
  Filled 2017-04-01 (×2): qty 8.8

## 2017-04-01 MED ORDER — METHOCARBAMOL 500 MG PO TABS
500.0000 mg | ORAL_TABLET | Freq: Four times a day (QID) | ORAL | Status: DC | PRN
  Administered 2017-04-02 – 2017-04-03 (×3): 500 mg via ORAL
  Filled 2017-04-01 (×3): qty 1

## 2017-04-01 MED ORDER — ACETAMINOPHEN 325 MG PO TABS: 650.0000 mg | ORAL_TABLET | Freq: Four times a day (QID) | ORAL | Status: DC | PRN

## 2017-04-01 MED ORDER — SODIUM CHLORIDE 0.9 % IV SOLN: INTRAVENOUS | Status: AC

## 2017-04-01 MED ORDER — BUPROPION HCL ER (XL) 150 MG PO TB24
300.0000 mg | ORAL_TABLET | Freq: Every day | ORAL | Status: DC
  Administered 2017-04-02 – 2017-04-04 (×3): 300 mg via ORAL
  Filled 2017-04-01 (×3): qty 2

## 2017-04-01 MED ORDER — METOPROLOL TARTRATE 12.5 MG HALF TABLET
12.5000 mg | ORAL_TABLET | Freq: Two times a day (BID) | ORAL | Status: DC
  Administered 2017-04-02 – 2017-04-04 (×6): 12.5 mg via ORAL
  Filled 2017-04-01 (×6): qty 1

## 2017-04-01 MED ORDER — ASPIRIN EC 325 MG PO TBEC
325.0000 mg | DELAYED_RELEASE_TABLET | Freq: Every day | ORAL | Status: DC
  Administered 2017-04-02 – 2017-04-04 (×3): 325 mg via ORAL
  Filled 2017-04-01 (×3): qty 1

## 2017-04-01 MED ORDER — MIRABEGRON ER 25 MG PO TB24
50.0000 mg | ORAL_TABLET | Freq: Every day | ORAL | Status: DC
  Administered 2017-04-02 – 2017-04-04 (×3): 50 mg via ORAL
  Filled 2017-04-01 (×3): qty 2

## 2017-04-01 MED ORDER — POLYVINYL ALCOHOL 1.4 % OP SOLN
1.0000 [drp] | Freq: Two times a day (BID) | OPHTHALMIC | Status: DC
  Administered 2017-04-02 – 2017-04-04 (×3): 1 [drp] via OPHTHALMIC
  Filled 2017-04-01: qty 15

## 2017-04-01 MED ORDER — ALBUTEROL SULFATE (2.5 MG/3ML) 0.083% IN NEBU: 2.5000 mg | INHALATION_SOLUTION | RESPIRATORY_TRACT | Status: DC | PRN

## 2017-04-01 MED ORDER — HEPARIN (PORCINE) IN NACL 100-0.45 UNIT/ML-% IJ SOLN
1250.0000 [IU]/h | INTRAMUSCULAR | Status: DC
  Administered 2017-04-01: 1250 [IU]/h via INTRAVENOUS
  Filled 2017-04-01: qty 250

## 2017-04-01 NOTE — ED Provider Notes (Signed)
[facility] EMERGENCY DEPARTMENT Provider Note   CSN: [id number] Arrival date & time: 04/01/17  1553     History   Chief Complaint Chief Complaint  Patient presents with  . Shortness of Breath  . Weakness    HPI FAIZAH KANDLER is a 84 y.o. female.  HPI  84 year old female history of asthma, COPD, frequent urinary tract infections, hypertension, spinal stenosis, diabetes, recent hospitalization from February 4 - February 6 from her skilled nursing facility for a COPD exacerbation requiring bilevel and empirically started on vancomycin/Zosyn along with steroid therapy and scheduled nebulizer.  Patient left the facility back to her skilled nursing with scheduled nebulizer along with intermittent O2 therapy.  Patient states she has had weakness since discharge and now nursing staff was concerned inability to transitioning from seated to standing.  Patient denies any dyspnea/shortness of breath.  Patient denies any chest pain.  Patient does endorse weakness.  Patient denies headache, changes in visual acuity or loss of consciousness.  Patient denies any anticoagulation/anti-platelet therapy.  Past Medical History:  Diagnosis Date  . Arthritis   . Asthma   . Breast cancer ([place]) 2006   right w/lumpectomy  . COPD (chronic obstructive pulmonary disease) ([place])   . Depression   . Diverticulosis of colon   . Endometrial hyperplasia 01/1996   fibroids, adenomyosis  . Fall 02/2017  . Frequent UTI   . History of colon polyps   . Hypertension   . Insomnia   . Osteopenia 12/08   hip  . Pre-diabetes   . Spinal stenosis 2001  . Vitamin D deficiency     Patient Active Problem List   Diagnosis Date Noted  . Acute respiratory failure with hypoxia ([place]) 04/01/2017  . Laceration of forehead   . Acute on chronic respiratory failure with hypoxia ([place]) 03/15/2017  . Fall 03/15/2017  . Depression with anxiety 03/15/2017  . COPD exacerbation ([place]) 03/15/2017  . COPD with  acute exacerbation ([employer])   . Acute lower UTI   . Sepsis ([place]) 11/08/2016  . Morbid obesity due to excess calories ([place]) 09/02/2016  . Esophageal dysfunction 09/01/2016  . PNA (pneumonia) 08/09/2016  . Wheelchair bound 08/09/2016  . Wound healing, delayed 08/09/2016  . HCAP (healthcare-associated pneumonia) 07/07/2016  . Chronic ulcer of right leg ([place]) 07/07/2016  . SIRS (systemic inflammatory response syndrome) ([place]) 05/08/2016  . Community acquired pneumonia 05/08/2016  . Failed total hip arthroplasty ([place]) 07/10/2015  . Breast cancer, right breast ([place]) 07/18/2014  . CONCUSSION WITH LOC OF UNSPECIFIED DURATION 07/19/2009  . OTH SYMPTOMS INVOLVING RESPIRATORY SYSTEM&CHEST 11/12/2008  . UNSPECIFIED DISORDER OF ANKLE AND FOOT JOINT 05/11/2008  . CERUMEN IMPACTION, BILATERAL 05/04/2008  . Dysmetabolic syndrome X 66/44/0347  . UNS ADVRS EFF UNS RX MEDICINAL&BIOLOGICAL SBSTNC 08/12/2007  . Other and unspecified hyperlipidemia 04/01/2007  . INSOMNIA, PERSISTENT 09/09/2006  . Essential hypertension 09/09/2006  . DIVERTICULOSIS, COLON 09/09/2006  . Osteoarthritis 09/09/2006  . Spinal stenosis 09/09/2006  . COLONIC POLYPS, HX OF 09/09/2006    Past Surgical History:  Procedure Laterality Date  . BILATERAL SALPINGOOPHORECTOMY  12/97  . BREAST LUMPECTOMY Right 03/2003  . CARPAL TUNNEL RELEASE    . CERVICAL FUSION    . CERVICAL LAMINECTOMY  1975   x4  . CHOLECYSTECTOMY  11/00   lap chole  . HYSTEROSCOPY  10/97   D&C, postmenopausal bleeding  . LUMBAR LAMINECTOMY  10/06   with spinal stenosis  . manipulation of hip for dislocation    .  ROTATOR CUFF REPAIR Right 11/96  . TONSILLECTOMY AND ADENOIDECTOMY    . TOTAL ABDOMINAL HYSTERECTOMY  12/97   endo hyperplasia, fibroids, adenomyosis  . TOTAL HIP ARTHROPLASTY Right 8/00  . TOTAL HIP REVISION Right 07/10/2015   Procedure: RIGHT ACETABULAR VS TOTAL HIP REVISION;  Surgeon: Gaynelle Arabian, MD;  Location: [facility] ORS;  Service: Orthopedics;   Laterality: Right;  . TOTAL KNEE ARTHROPLASTY Left 06/2003  . TOTAL KNEE ARTHROPLASTY Right 10/06  . TUBAL LIGATION Bilateral 1968    OB History    Gravida Para Term Preterm AB Living   3 3 3          SAB TAB Ectopic Multiple Live Births                   Home Medications    Prior to Admission medications   Medication Sig Start Date End Date Taking? Authorizing Provider  acetaminophen (TYLENOL) 325 MG tablet Take 2 tablets (650 mg total) by mouth every 6 (six) hours as needed for mild pain (or Fever >/= 101). 07/12/15   Perkins, Alexzandrew L, PA-C  albuterol (ACCUNEB) 0.63 MG/3ML nebulizer solution Take 3 mLs (0.63 mg total) by nebulization 4 (four) times daily. 03/24/17   Debbe Odea, MD  albuterol (PROAIR HFA) 108 (90 BASE) MCG/ACT inhaler Inhale 2 puffs into the lungs every 4 (four) hours as needed for shortness of breath.     [provider]  albuterol (PROVENTIL) (2.5 MG/3ML) 0.083% nebulizer solution Take 3 mLs (2.5 mg total) by nebulization every hour as needed for shortness of breath. 03/24/17   Debbe Odea, MD  aspirin EC 325 MG tablet Take 325 mg by mouth daily.     [provider]  Benzocaine-Menthol (CEPACOL SORE THROAT) 15-2.6 MG LOZG Use as directed 1 lozenge in the mouth or throat every 4 (four) hours as needed (for sore throat).    [provider]  benzonatate (TESSALON) 200 MG capsule Take 200 mg by mouth 3 (three) times daily as needed for cough.    [provider]  buPROPion (WELLBUTRIN XL) 300 MG 24 hr tablet Take 300 mg by mouth every morning.     [provider]  Calcium Carb-Cholecalciferol (CALCIUM+D3) 600-800 MG-UNIT TABS Take 1 tablet by mouth daily.    [provider]  clonazePAM (KLONOPIN) 1 MG tablet Take 1 tablet (1 mg total) by mouth at bedtime as needed (insomnia). Patient taking differently: Take 1 mg by mouth at bedtime as needed for anxiety.  08/12/16   Florencia Reasons, MD  docusate sodium (COLACE) 100 MG  capsule Take 100 mg by mouth daily.    [provider]  escitalopram (LEXAPRO) 10 MG tablet Take 10 mg by mouth daily.  04/20/11   [provider]  fluticasone (FLONASE) 50 MCG/ACT nasal spray Place 1 spray into both nostrils 2 (two) times daily.    [provider]  gabapentin (NEURONTIN) 300 MG capsule TAKE ONE CAPSULE THREE TIMES DAILY Patient taking differently: TAKE 300 mg CAPSULE THREE TIMES DAILY 12/14/16   Magrinat, Virgie Dad, MD  guaiFENesin (MUCINEX) 600 MG 12 hr tablet Take 1 tablet (600 mg total) by mouth 2 (two) times daily. 03/24/17 03/24/18  Debbe Odea, MD  HYDROcodone-acetaminophen (NORCO/VICODIN) 5-325 MG tablet Take 1 tablet by mouth 2 (two) times daily as needed for moderate pain. 03/24/17   Debbe Odea, MD  ibuprofen (ADVIL) 200 MG tablet Take 200 mg by mouth every 12 (twelve) hours as needed (pain).  [provider]  ipratropium-albuterol (DUONEB) 0.5-2.5 (3) MG/3ML SOLN Take 3 mLs by nebulization every 6 (six) hours as needed. Patient taking differently: Take 3 mLs by nebulization every 6 (six) hours as needed (wheezing).  03/16/17   Debbe Odea, MD  methocarbamol (ROBAXIN) 500 MG tablet Take 1 tablet (500 mg total) by mouth every 6 (six) hours as needed for muscle spasms. 07/12/15   Perkins, Alexzandrew L, PA-C  metoprolol tartrate (LOPRESSOR) 25 MG tablet Take 0.5 tablets (12.5 mg total) by mouth 2 (two) times daily. 08/12/16   Florencia Reasons, MD  mirabegron ER (MYRBETRIQ) 50 MG TB24 tablet Take 50 mg by mouth daily.    [provider]  mometasone-formoterol (DULERA) 200-5 MCG/ACT AERO Inhale 1 puff into the lungs 2 (two) times daily.    [provider]  montelukast (SINGULAIR) 10 MG tablet Take 10 mg by mouth at bedtime.      [provider]  ondansetron (ZOFRAN) 4 MG tablet Take 4 mg by mouth every 8 (eight) hours as needed for nausea or vomiting.    [provider]  polyethylene glycol (MIRALAX / GLYCOLAX)  packet Take 17 g by mouth daily as needed for mild constipation. 07/12/15   Perkins, Alexzandrew L, PA-C  predniSONE (DELTASONE) 10 MG tablet Take 60 mg tomorrow, 40 mg for 2 days, 20 mg for 2 days and 10 mg the last 2 days 03/16/17   Debbe Odea, MD  Propylene Glycol-Glycerin (SOOTHE) 0.6-0.6 % SOLN Apply 1 drop to eye 2 (two) times daily. 11/28/15   Domenic Moras, PA-C  senna (SENOKOT) 8.6 MG TABS tablet Take 1 tablet by mouth daily as needed for mild constipation.    [provider]  sodium phosphate (FLEET) 7-19 GM/118ML ENEM Place 1 enema rectally daily as needed for severe constipation.    [provider]    Family History Family History  Problem Relation Age of Onset  . Diabetes Father   . Colon cancer Father 58  . Heart failure Father   . Arthritis Mother   . Thyroid disease Mother   . Osteoporosis Mother   . Diabetes Brother   . Diabetes Brother   . Osteoporosis Maternal Aunt   . Osteoporosis Maternal Grandmother     Social History Social History   Tobacco Use  . Smoking status: Former Smoker    Packs/day: 0.25    Years: 10.00    Pack years: 2.50    Types: Cigarettes    Last attempt to quit: 04/24/1990    Years since quitting: 26.9  . Smokeless tobacco: Never Used  Substance Use Topics  . Alcohol use: Yes    Alcohol/week: 2.4 oz    Types: 4 Glasses of wine per week    Comment: occ  . Drug use: No     Allergies   Oxycodone hcl [oxycodone hcl]; Morphine sulfate; and Sulfamethoxazole   Review of Systems Review of Systems  Review of Systems  Constitutional: Negative for fever and chills.  HENT: Negative for ear pain, sore throat and trouble swallowing.   Eyes: Negative for pain and visual disturbance.  Respiratory: + cough/neg SOB Cardiovascular: Negative for chest pain and leg swelling.  Gastrointestinal: Negative for nausea, vomiting, abdominal pain and diarrhea.  Genitourinary: Negative for dysuria, urgency and frequency.    Musculoskeletal: Negative for back pain and joint swelling.  Skin: Negative for rash and wound.  Neurological: Negative for dizziness, syncope, speech difficulty, + weakness   Physical Exam Updated Vital Signs BP 134/90  Pulse 96   Resp 16   SpO2 95%   Physical Exam  Physical Exam Vitals:   04/01/17 1645 04/01/17 1700  BP: 127/74 134/90  Pulse: 92 96  Resp: (!) 26 16  SpO2: 95% 95%   Constitutional: Patient is in no acute distress Head: Normocephalic and atraumatic.  Eyes: Extraocular motion intact, no scleral icterus Neck: Supple without meningismus, mass, or overt JVD Respiratory: air mvt good; bilateral slight wheeze to bases.  No respiratory distress. CV: Heart regular rate and rhythm, no obvious murmurs.  Pulses +2 and symmetric Abdomen: Soft, non-tender, non-distended MSK: Extremities are atraumatic without deformity, ROM intact Skin: Warm, dry, intact Neuro: Alert and oriented, no motor deficit noted; CN II-XII intact;neg pronator drift.  Psychiatric: Mood and affect are normal.  ED Treatments / Results  Labs (all labs ordered are listed, but only abnormal results are displayed) Labs Reviewed  BASIC METABOLIC PANEL - Abnormal; Notable for the following components:      Result Value   Glucose, Bld 134 (*)    All other components within normal limits  CBC - Abnormal; Notable for the following components:   WBC 12.3 (*)    Hemoglobin 15.2 (*)    HCT 46.5 (*)    Platelets 127 (*)    All other components within normal limits  I-STAT VENOUS BLOOD GAS, ED - Abnormal; Notable for the following components:   pH, Ven 7.511 (*)    pCO2, Ven 34.6 (*)    pO2, Ven 127.0 (*)    Acid-Base Excess 5.0 (*)    All other components within normal limits  INFLUENZA PANEL BY PCR (TYPE A & B)  URINALYSIS, ROUTINE W REFLEX MICROSCOPIC  HEPARIN LEVEL (UNFRACTIONATED)  CBC  BASIC METABOLIC PANEL  CBG MONITORING, ED  I-STAT TROPONIN, ED    EKG  EKG Interpretation None        Radiology Ct Head Wo Contrast  Result Date: 04/01/2017 CLINICAL DATA:  84 year old female status post fall. Generalized weakness, but greater on the right. EXAM: CT HEAD WITHOUT CONTRAST TECHNIQUE: Contiguous axial images were obtained from the base of the skull through the vertex without intravenous contrast. COMPARISON:  Head and cervical spine CT 03/14/2017 and earlier. FINDINGS: Brain: Stable cerebral volume. Stable mild ventricular prominence and mild for age patchy bilateral cerebral white matter hypodensity. No midline shift, ventriculomegaly, mass effect, evidence of mass lesion, intracranial hemorrhage or evidence of cortically based acute infarction. No cortical encephalomalacia identified. Vascular: Calcified atherosclerosis at the skull base. No suspicious intracranial vascular hyperdensity. Skull: No acute osseous abnormality identified. Sinuses/Orbits: Clear. Other: No acute scalp or orbits soft tissue findings. IMPRESSION: No acute intracranial abnormality or acute traumatic injury identified. Stable non contrast CT appearance of the brain. Electronically Signed   By: Genevie Ann M.D.   On: 04/01/2017 17:54   Ct Angio Chest Pe W And/or Wo Contrast  Result Date: 04/01/2017 CLINICAL DATA:  84 year old female with weakness and shortness of breath for 2 weeks. EXAM: CT ANGIOGRAPHY CHEST WITH CONTRAST TECHNIQUE: Multidetector CT imaging of the chest was performed using the standard protocol during bolus administration of intravenous contrast. Multiplanar CT image reconstructions and MIPs were obtained to evaluate the vascular anatomy. CONTRAST:  141mL ISOVUE-370 IOPAMIDOL (ISOVUE-370) INJECTION 76% COMPARISON:  Portable chest radiograph 1657 hr today and earlier. Chest CTA 08/09/2016. FINDINGS: Cardiovascular: Excellent contrast bolus timing in the pulmonary arterial tree. Positive for bilateral pulmonary emboli. Left hilar thrombus extends into the upper lobe, lingula, and some lower lobe  branches. No saddle embolus. Distal right main pulmonary artery thrombus extends into multiple right upper lobe branches, some of which are occluded. Thrombus also extends into multiple right lower lobe pulmonary artery branches. RV / LV ratio = 1.0. No pericardial effusion. Negative visualized aorta aside from atherosclerosis. Mediastinum/Nodes: No mediastinal lymphadenopathy. Lungs/Pleura: Major airways are patent. There is mild scattered peripheral right upper lobe ground-glass opacity, mostly in the anterior right lung apex. There is occasional distal peribronchial ground-glass opacity and nodularity in the inferior right upper lobe and superior right lower lobe. Superimposed peripheral ground-glass opacity in the inferior bilateral middle and lower lobes appears to be chronic scarring. No acute pulmonary opacity in the left lung. No pleural effusion. Upper Abdomen: Stable and negative visualized upper abdominal viscera. Musculoskeletal: Partially visible lower cervical spine fusion hardware. No acute osseous abnormality identified. Review of the MIP images confirms the above findings. IMPRESSION: 1. Positive for acute PE with CT evidence of possible right heart strain (RV/LV Ratio = 1.0) suggesting intermediate risk PE. The presence of right heart strain has been associated with an increased risk of morbidity and mortality. Consider activation of code PE by paging [phone]. 2. No pleural or pericardial effusion. 3. Scattered small areas of nonspecific peripheral ground-glass opacity in the right lung might indicate small areas of pulmonary infarct. Superimposed chronic bilateral lung base scarring. Critical Value/emergent results were called by telephone at the time of interpretation on 04/01/2017 at 6:02 pm to Dr. Willette Alma , who verbally acknowledged these results. Electronically Signed   By: Genevie Ann M.D.   On: 04/01/2017 18:04   Dg Chest Port 1 View  Result Date: 04/01/2017 CLINICAL DATA:   Weakness.  Shortness of breath. EXAM: PORTABLE CHEST 1 VIEW COMPARISON:  03/22/2017. FINDINGS: Mediastinum hilar structures normal. Heart size normal. Low lung volumes. Bibasilar atelectasis. No pleural effusion or pneumothorax prior cervicothoracic spine fusion. IMPRESSION: Low lung volumes with mild bibasilar atelectasis. Electronically Signed   By: Marcello Moores  Register   On: 04/01/2017 17:06    Procedures Procedures (including critical care time)  Medications Ordered in ED Medications  heparin ADULT infusion 100 units/mL (25000 units/260mL sodium chloride 0.45%) (1,250 Units/hr Intravenous New Bag/Given 04/01/17 1820)  HYDROmorphone (DILAUDID) injection 0.5 mg (not administered)  albuterol (PROVENTIL) (2.5 MG/3ML) 0.083% nebulizer solution 2.5 mg (not administered)  aspirin EC tablet 325 mg (not administered)  buPROPion (WELLBUTRIN XL) 24 hr tablet 300 mg (not administered)  clonazePAM (KLONOPIN) tablet 1 mg (not administered)  docusate sodium (COLACE) capsule 100 mg (not administered)  escitalopram (LEXAPRO) tablet 10 mg (not administered)  fluticasone (FLONASE) 50 MCG/ACT nasal spray 1 spray (not administered)  gabapentin (NEURONTIN) capsule 300 mg (not administered)  methocarbamol (ROBAXIN) tablet 500 mg (not administered)  metoprolol tartrate (LOPRESSOR) tablet 12.5 mg (not administered)  mirabegron ER (MYRBETRIQ) tablet 50 mg (not administered)  mometasone-formoterol (DULERA) 200-5 MCG/ACT inhaler 1 puff (not administered)  montelukast (SINGULAIR) tablet 10 mg (not administered)  Propylene Glycol-Glycerin 0.6-0.6 % SOLN 1 drop (not administered)  sodium chloride flush (NS) 0.9 % injection 3 mL (not administered)  0.9 %  sodium chloride infusion ( Intravenous Hold 04/01/17 2102)  acetaminophen (TYLENOL) tablet 650 mg (not administered)    Or  acetaminophen (TYLENOL) suppository 650 mg (not administered)  ondansetron (ZOFRAN) tablet 4 mg (not administered)    Or  ondansetron (ZOFRAN)  injection 4 mg (not administered)  albuterol (PROVENTIL) (2.5 MG/3ML) 0.083% nebulizer solution 5 mg (5 mg Nebulization Given 04/01/17 1810)  ipratropium (ATROVENT) nebulizer solution  0.5 mg (0.5 mg Nebulization Given 04/01/17 1810)  methylPREDNISolone sodium succinate (SOLU-MEDROL) 125 mg/2 mL injection 125 mg (125 mg Intravenous Given 04/01/17 1810)  iopamidol (ISOVUE-370) 76 % injection (100 mLs  Contrast Given 04/01/17 1725)  heparin bolus via infusion 4,500 Units (4,500 Units Intravenous Bolus from Bag 04/01/17 1820)  HYDROmorphone (DILAUDID) injection 0.5 mg (0.5 mg Intravenous Given 04/01/17 2102)  furosemide (LASIX) injection 40 mg (40 mg Intravenous Given 04/01/17 2139)     Initial Impression / Assessment and Plan / ED Course  I have reviewed the triage vital signs and the nursing notes.  Pertinent labs & imaging results that were available during my care of the patient were reviewed by me and considered in my medical decision making (see chart for details).    84 year old female history of asthma, COPD, frequent urinary tract infections, hypertension, spinal stenosis, diabetes, recent hospitalization from February 4 - February 6 from her skilled nursing facility for a COPD exacerbation requiring bilevel and empirically started on vancomycin/Zosyn along with steroid therapy and scheduled nebulizer.  Patient left the facility back to her skilled nursing with scheduled nebulizer along with intermittent O2 therapy.  Patient states she has had weakness since discharge and now nursing staff was concerned inability to transitioning from seated to standing.  Patient denies any dyspnea/shortness of breath.  Patient denies any chest pain.  Patient does endorse weakness.  Patient denies headache, changes in visual acuity or loss of consciousness.  Patient denies any anticoagulation/anti-platelet therapy.  Physical exam as annotated above patient does not appear to have any functional deficits and not in  acute respiratory distress.  There is a new oxygen requirement 3 L on nasal cannula as patient arrived 89% on room air.  Review of patient's labs shows mild leukocytosis of 12,000 with chest x-ray no findings concerning for infectious etiology.  Likely slight leukocytosis related to recent and current steroid therapy.  Venous blood gas with respiratory alkalosis pH 7.5 and PCO2 of 34.6.  And patient has good renal function creatinine of 0.66.  Troponin x1 0.05 and no findings on EKG concerning for ACS.  CT head without findings concerning for ICH/CVA.  This correlates with physical exam.  Patient with bilateral pulmonary embolisms as relayed by radiologist.  Plan for anticoagulation in the emergency department with heparin.  Of note patient was given DuoNeb x1 along with 125 mg of Solu-Medrol.    Plan for admission to the hospitalist group for hypoxia in the setting of pulmonary embolism.  Currently patient is hemodynamically stable with good oxygen saturation at 3 L nasal cannula.   Final Clinical Impressions(s) / ED Diagnoses   Final diagnoses:  Hypoxia  Other pulmonary embolism without acute cor pulmonale, unspecified chronicity ([place])  Respiratory alkalosis    ED Discharge Orders    None       Willette Alma, DO 04/01/17 2238    Drenda Freeze, MD 04/02/17 2337

## 2017-04-01 NOTE — ED Notes (Signed)
ED Provider at bedside. 

## 2017-04-01 NOTE — Progress Notes (Addendum)
ANTICOAGULATION CONSULT NOTE - Initial Consult  Pharmacy Consult:  Heparin Indication:  Acute PE  Allergies  Allergen Reactions  . Oxycodone Hcl [Oxycodone Hcl] Rash  . Morphine Sulfate Other (See Comments)    REACTION: very anxious  . Sulfamethoxazole Other (See Comments)    REACTION: unspecified    Patient Measurements:   Heparin Dosing Weight: 78 kg  Vital Signs: BP: 131/80 (02/14 1556) Pulse Rate: 92 (02/14 1556)  Labs: Recent Labs    04/01/17 1607  HGB 15.2*  HCT 46.5*  PLT 127*  CREATININE 0.66    Estimated Creatinine Clearance: 57.3 mL/min (by C-G formula based on SCr of 0.66 mg/dL).   Medical History: Past Medical History:  Diagnosis Date  . Arthritis   . Asthma   . Breast cancer ([place]) 2006   right w/lumpectomy  . COPD (chronic obstructive pulmonary disease) ([place])   . Depression   . Diverticulosis of colon   . Endometrial hyperplasia 01/1996   fibroids, adenomyosis  . Fall 02/2017  . Frequent UTI   . History of colon polyps   . Hypertension   . Insomnia   . Osteopenia 12/08   hip  . Pre-diabetes   . Spinal stenosis 2001  . Vitamin D deficiency      Assessment: 74 YOF presented with SOB, found to have acute PE on CTA.  Pharmacy consulted to initiate IV heparin.  Baseline labs and home meds reviewed.  Patient's platelet count is low but no bleeding reported.  Head CT negative.   Goal of Therapy:  Heparin level 0.3-0.7 units/ml Monitor platelets by anticoagulation protocol: Yes    Plan:  Heparin 4500 units IV bolus, then Heparin gtt at 1250 units/hr Check 8 hr heparin level Daily heparin level and CBC   Lacy Sofia D. Mina Marble, PharmD, BCPS Pager:  [phone] 04/01/2017, 6:11 PM

## 2017-04-01 NOTE — ED Notes (Signed)
Hospitalist bedside 

## 2017-04-01 NOTE — ED Notes (Signed)
Pt's lung sounds noted to be acutely congested, continuous NS infusion held, MD paged, orders received.

## 2017-04-01 NOTE — H&P (Signed)
[facility] History and Physical  [name]:[id number] DOB: [birth year] DOA: 04/01/2017  Referring physician:  PCP: Mariah Bowen, MD   Chief Complaint: "I could not walk."  HPI: [name] is a 84 y.o. female past medical history significant for breast cancer, asthma, depression, COPD presents the emergency room with shortness of breath.  Note patient was recently discharged from [facility] for COPD exacerbation.  She states that she was well for about the first 3-4 days and then gradually became weaker short of breath.  Over the last 2 days she was acutely short of breath and had little to no response from breathing treatments.  Patient came to the emergency room for further evaluation after having difficulty in physical therapy due to dyspnea.  ED course: CTA chest done showing bilateral PE and possible right heart strain.  Patient started on IV heparin.  Patient needing supplemental oxygen does not wear oxygen at baseline.  Hospitalist consulted for admission.   Review of Systems:  As per HPI otherwise 10 point review of systems negative.    Past Medical History:  Diagnosis Date  . Arthritis   . Asthma   . Breast cancer ([place]) 2006   right w/lumpectomy  . COPD (chronic obstructive pulmonary disease) ([place])   . Depression   . Diverticulosis of colon   . Endometrial hyperplasia 01/1996   fibroids, adenomyosis  . Fall 02/2017  . Frequent UTI   . History of colon polyps   . Hypertension   . Insomnia   . Osteopenia 12/08   hip  . Pre-diabetes   . Spinal stenosis 2001  . Vitamin D deficiency    Past Surgical History:  Procedure Laterality Date  . BILATERAL SALPINGOOPHORECTOMY  12/97  . BREAST LUMPECTOMY Right 03/2003  . CARPAL TUNNEL RELEASE    . CERVICAL FUSION    . CERVICAL LAMINECTOMY  1975   x4  . CHOLECYSTECTOMY  11/00   lap chole  . HYSTEROSCOPY  10/97   D&C, postmenopausal bleeding  . LUMBAR LAMINECTOMY  10/06   with spinal stenosis    . manipulation of hip for dislocation    . ROTATOR CUFF REPAIR Right 11/96  . TONSILLECTOMY AND ADENOIDECTOMY    . TOTAL ABDOMINAL HYSTERECTOMY  12/97   endo hyperplasia, fibroids, adenomyosis  . TOTAL HIP ARTHROPLASTY Right 8/00  . TOTAL HIP REVISION Right 07/10/2015   Procedure: RIGHT ACETABULAR VS TOTAL HIP REVISION;  Surgeon: Gaynelle Arabian, MD;  Location: [facility] ORS;  Service: Orthopedics;  Laterality: Right;  . TOTAL KNEE ARTHROPLASTY Left 06/2003  . TOTAL KNEE ARTHROPLASTY Right 10/06  . TUBAL LIGATION Bilateral 1968   Social History:  reports that she quit smoking about 26 years ago. Her smoking use included cigarettes. She has a 2.50 pack-year smoking history. she has never used smokeless tobacco. She reports that she drinks about 2.4 oz of alcohol per week. She reports that she does not use drugs.  Allergies  Allergen Reactions  . Oxycodone Hcl [Oxycodone Hcl] Rash  . Morphine Sulfate Other (See Comments)    REACTION: very anxious  . Sulfamethoxazole Other (See Comments)    REACTION: unspecified    Family History  Problem Relation Age of Onset  . Diabetes Father   . Colon cancer Father 84  . Heart failure Father   . Arthritis Mother   . Thyroid disease Mother   . Osteoporosis Mother   . Diabetes Brother   . Diabetes Brother   . Osteoporosis Maternal  Aunt   . Osteoporosis Maternal Grandmother      Prior to Admission medications   Medication Sig Start Date End Date Taking? Authorizing Provider  acetaminophen (TYLENOL) 325 MG tablet Take 2 tablets (650 mg total) by mouth every 6 (six) hours as needed for mild pain (or Fever >/= 101). 07/12/15   Perkins, Alexzandrew L, PA-C  albuterol (ACCUNEB) 0.63 MG/3ML nebulizer solution Take 3 mLs (0.63 mg total) by nebulization 4 (four) times daily. 03/24/17   Debbe Odea, MD  albuterol (PROAIR HFA) 108 (90 BASE) MCG/ACT inhaler Inhale 2 puffs into the lungs every 4 (four) hours as needed for shortness of breath.     [provider]  albuterol (PROVENTIL) (2.5 MG/3ML) 0.083% nebulizer solution Take 3 mLs (2.5 mg total) by nebulization every hour as needed for shortness of breath. 03/24/17   Debbe Odea, MD  aspirin EC 325 MG tablet Take 325 mg by mouth daily.     [provider]  Benzocaine-Menthol (CEPACOL SORE THROAT) 15-2.6 MG LOZG Use as directed 1 lozenge in the mouth or throat every 4 (four) hours as needed (for sore throat).    [provider]  benzonatate (TESSALON) 200 MG capsule Take 200 mg by mouth 3 (three) times daily as needed for cough.    [provider]  buPROPion (WELLBUTRIN XL) 300 MG 24 hr tablet Take 300 mg by mouth every morning.     [provider]  Calcium Carb-Cholecalciferol (CALCIUM+D3) 600-800 MG-UNIT TABS Take 1 tablet by mouth daily.    [provider]  clonazePAM (KLONOPIN) 1 MG tablet Take 1 tablet (1 mg total) by mouth at bedtime as needed (insomnia). Patient taking differently: Take 1 mg by mouth at bedtime as needed for anxiety.  08/12/16   Florencia Reasons, MD  docusate sodium (COLACE) 100 MG capsule Take 100 mg by mouth daily.    [provider]  escitalopram (LEXAPRO) 10 MG tablet Take 10 mg by mouth daily.  04/20/11   [provider]  fluticasone (FLONASE) 50 MCG/ACT nasal spray Place 1 spray into both nostrils 2 (two) times daily.    [provider]  gabapentin (NEURONTIN) 300 MG capsule TAKE ONE CAPSULE THREE TIMES DAILY Patient taking differently: TAKE 300 mg CAPSULE THREE TIMES DAILY 12/14/16   Magrinat, Virgie Dad, MD  guaiFENesin (MUCINEX) 600 MG 12 hr tablet Take 1 tablet (600 mg total) by mouth 2 (two) times daily. 03/24/17 03/24/18  Debbe Odea, MD  HYDROcodone-acetaminophen (NORCO/VICODIN) 5-325 MG tablet Take 1 tablet by mouth 2 (two) times daily as needed for moderate pain. 03/24/17   Debbe Odea, MD  ibuprofen (ADVIL) 200 MG tablet Take 200 mg by mouth every 12 (twelve) hours as needed (pain).     [provider]  ipratropium-albuterol (DUONEB) 0.5-2.5 (3) MG/3ML SOLN Take 3 mLs by nebulization every 6 (six) hours as needed. Patient taking differently: Take 3 mLs by nebulization every 6 (six) hours as needed (wheezing).  03/16/17   Debbe Odea, MD  methocarbamol (ROBAXIN) 500 MG tablet Take 1 tablet (500 mg total) by mouth every 6 (six) hours as needed for muscle spasms. 07/12/15   Perkins, Alexzandrew L, PA-C  metoprolol tartrate (LOPRESSOR) 25 MG tablet Take 0.5 tablets (12.5 mg total) by mouth 2 (two) times daily. 08/12/16   Florencia Reasons, MD  mirabegron ER (MYRBETRIQ) 50 MG TB24 tablet Take 50 mg by mouth daily.    [provider]  mometasone-formoterol (DULERA) 200-5 MCG/ACT AERO Inhale 1 puff  into the lungs 2 (two) times daily.    [provider]  montelukast (SINGULAIR) 10 MG tablet Take 10 mg by mouth at bedtime.      [provider]  ondansetron (ZOFRAN) 4 MG tablet Take 4 mg by mouth every 8 (eight) hours as needed for nausea or vomiting.    [provider]  polyethylene glycol (MIRALAX / GLYCOLAX) packet Take 17 g by mouth daily as needed for mild constipation. 07/12/15   Perkins, Alexzandrew L, PA-C  predniSONE (DELTASONE) 10 MG tablet Take 60 mg tomorrow, 40 mg for 2 days, 20 mg for 2 days and 10 mg the last 2 days 03/16/17   Debbe Odea, MD  Propylene Glycol-Glycerin (SOOTHE) 0.6-0.6 % SOLN Apply 1 drop to eye 2 (two) times daily. 11/28/15   Domenic Moras, PA-C  senna (SENOKOT) 8.6 MG TABS tablet Take 1 tablet by mouth daily as needed for mild constipation.    [provider]  sodium phosphate (FLEET) 7-19 GM/118ML ENEM Place 1 enema rectally daily as needed for severe constipation.    [provider]   Physical Exam: Vitals:   04/01/17 1615 04/01/17 1630 04/01/17 1645 04/01/17 1700  BP: 121/81 127/76 127/74 134/90  Pulse: 93 92 92 96  Resp: (!) 24 (!) 25 (!) 26 16  SpO2: (!) 88% 95% 95% 95%    Wt Readings from Last 3  Encounters:  03/23/17 87.5 kg (192 lb 14.4 oz)  03/15/17 96.1 kg (211 lb 13.8 oz)  11/10/16 98.7 kg (217 lb 9.5 oz)    General:  Appears calm and comfortable; A&Ox3 Eyes:  PERRL, EOMI, normal lids, iris; light sensitivity (chronic) ENT:  grossly normal hearing, lips & tongue Neck:  no LAD, masses or thyromegaly Cardiovascular:  RRR, no m/r/g. No LE edema.  Respiratory:  Mild wheeze. Normal respiratory effort. Abdomen:  soft, ntnd Skin:  no rash or induration seen on limited exam Musculoskeletal:  grossly normal tone BUE/BLE Psychiatric:  grossly normal mood and affect, speech fluent and appropriate Neurologic:  CN 2-12 grossly intact, moves all extremities in coordinated fashion.          Labs on Admission:  Basic Metabolic Panel: Recent Labs  Lab 04/01/17 1607  NA 136  K 4.5  CL 102  CO2 25  GLUCOSE 134*  BUN 12  CREATININE 0.66  CALCIUM 9.1   Liver Function Tests: No results for input(s): AST, ALT, ALKPHOS, BILITOT, PROT, ALBUMIN in the last 168 hours. No results for input(s): LIPASE, AMYLASE in the last 168 hours. No results for input(s): AMMONIA in the last 168 hours. CBC: Recent Labs  Lab 04/01/17 1607  WBC 12.3*  HGB 15.2*  HCT 46.5*  MCV 97.1  PLT 127*   Cardiac Enzymes: No results for input(s): CKTOTAL, CKMB, CKMBINDEX, TROPONINI in the last 168 hours.  BNP (last 3 results) Recent Labs    07/07/16 0844 11/08/16 0855 03/22/17 0829  BNP 103.1* 50.8 35.0    ProBNP (last 3 results) No results for input(s): PROBNP in the last 8760 hours.   Serum creatinine: 0.66 mg/dL 04/01/17 1607 Estimated creatinine clearance: 57.3 mL/min  CBG: No results for input(s): GLUCAP in the last 168 hours.  Radiological Exams on Admission: Ct Head Wo Contrast  Result Date: 04/01/2017 CLINICAL DATA:  84 year old female status post fall. Generalized weakness, but greater on the right. EXAM: CT HEAD WITHOUT CONTRAST TECHNIQUE: Contiguous axial images were obtained  from the base of the skull through the vertex without intravenous contrast.  COMPARISON:  Head and cervical spine CT 03/14/2017 and earlier. FINDINGS: Brain: Stable cerebral volume. Stable mild ventricular prominence and mild for age patchy bilateral cerebral white matter hypodensity. No midline shift, ventriculomegaly, mass effect, evidence of mass lesion, intracranial hemorrhage or evidence of cortically based acute infarction. No cortical encephalomalacia identified. Vascular: Calcified atherosclerosis at the skull base. No suspicious intracranial vascular hyperdensity. Skull: No acute osseous abnormality identified. Sinuses/Orbits: Clear. Other: No acute scalp or orbits soft tissue findings. IMPRESSION: No acute intracranial abnormality or acute traumatic injury identified. Stable non contrast CT appearance of the brain. Electronically Signed   By: Genevie Ann M.D.   On: 04/01/2017 17:54   Ct Angio Chest Pe W And/or Wo Contrast  Result Date: 04/01/2017 CLINICAL DATA:  84 year old female with weakness and shortness of breath for 2 weeks. EXAM: CT ANGIOGRAPHY CHEST WITH CONTRAST TECHNIQUE: Multidetector CT imaging of the chest was performed using the standard protocol during bolus administration of intravenous contrast. Multiplanar CT image reconstructions and MIPs were obtained to evaluate the vascular anatomy. CONTRAST:  1100mL ISOVUE-370 IOPAMIDOL (ISOVUE-370) INJECTION 76% COMPARISON:  Portable chest radiograph 1657 hr today and earlier. Chest CTA 08/09/2016. FINDINGS: Cardiovascular: Excellent contrast bolus timing in the pulmonary arterial tree. Positive for bilateral pulmonary emboli. Left hilar thrombus extends into the upper lobe, lingula, and some lower lobe branches. No saddle embolus. Distal right main pulmonary artery thrombus extends into multiple right upper lobe branches, some of which are occluded. Thrombus also extends into multiple right lower lobe pulmonary artery branches. RV / LV ratio = 1.0.  No pericardial effusion. Negative visualized aorta aside from atherosclerosis. Mediastinum/Nodes: No mediastinal lymphadenopathy. Lungs/Pleura: Major airways are patent. There is mild scattered peripheral right upper lobe ground-glass opacity, mostly in the anterior right lung apex. There is occasional distal peribronchial ground-glass opacity and nodularity in the inferior right upper lobe and superior right lower lobe. Superimposed peripheral ground-glass opacity in the inferior bilateral middle and lower lobes appears to be chronic scarring. No acute pulmonary opacity in the left lung. No pleural effusion. Upper Abdomen: Stable and negative visualized upper abdominal viscera. Musculoskeletal: Partially visible lower cervical spine fusion hardware. No acute osseous abnormality identified. Review of the MIP images confirms the above findings. IMPRESSION: 1. Positive for acute PE with CT evidence of possible right heart strain (RV/LV Ratio = 1.0) suggesting intermediate risk PE. The presence of right heart strain has been associated with an increased risk of morbidity and mortality. Consider activation of code PE by paging [phone]. 2. No pleural or pericardial effusion. 3. Scattered small areas of nonspecific peripheral ground-glass opacity in the right lung might indicate small areas of pulmonary infarct. Superimposed chronic bilateral lung base scarring. Critical Value/emergent results were called by telephone at the time of interpretation on 04/01/2017 at 6:02 pm to Dr. Willette Alma , who verbally acknowledged these results. Electronically Signed   By: Genevie Ann M.D.   On: 04/01/2017 18:04   Dg Chest Port 1 View  Result Date: 04/01/2017 CLINICAL DATA:  Weakness.  Shortness of breath. EXAM: PORTABLE CHEST 1 VIEW COMPARISON:  03/22/2017. FINDINGS: Mediastinum hilar structures normal. Heart size normal. Low lung volumes. Bibasilar atelectasis. No pleural effusion or pneumothorax prior cervicothoracic spine  fusion. IMPRESSION: Low lung volumes with mild bibasilar atelectasis. Electronically Signed   By: Marcello Moores  Register   On: 04/01/2017 17:06    EKG: pending  Assessment/Plan Active Problems:   Acute respiratory failure with hypoxia (HCC)   PE w/ hypoxia RT consult Prn albuterol  Cont puls eox Cont heparin ECHO in AM IVF  COPD Cont Dulera  CAD Cont ASA  Depression/anxiety No SI/HI Cont wellbutrin Prn klonopin Cont lexapro  HTN Lopressor BId  Allergies Cont flonase, singulair  Bladder dysf Cont home med  Chronic pan Cont gabapentin tid    Code Status: FC  DVT Prophylaxis: heparin Family Communication: asked that kids not be called unless emergent Disposition Plan: Pending Improvement  Status: inpt telre  Elwin Mocha, MD Family Medicine [facility] [email or web address] Password [email or web address]

## 2017-04-01 NOTE — ED Triage Notes (Signed)
Pt arrives to ED from assisted living with complaints of weakness, SOB  since 2 weeks ago after being admitted for a fall and COPD exacerbation. EMS reports pt has generalized weakness but worse on the right side. Pt ambulatory with walker assistance. Pt placed in position of comfort with bed locked and lowered, call bell in reach.

## 2017-04-02 ENCOUNTER — Other Ambulatory Visit: Payer: Self-pay

## 2017-04-02 ENCOUNTER — Inpatient Hospital Stay (HOSPITAL_COMMUNITY): Payer: Medicare Other

## 2017-04-02 DIAGNOSIS — F329 Major depressive disorder, single episode, unspecified: Secondary | ICD-10-CM

## 2017-04-02 DIAGNOSIS — I2510 Atherosclerotic heart disease of native coronary artery without angina pectoris: Secondary | ICD-10-CM

## 2017-04-02 DIAGNOSIS — I361 Nonrheumatic tricuspid (valve) insufficiency: Secondary | ICD-10-CM

## 2017-04-02 DIAGNOSIS — I2602 Saddle embolus of pulmonary artery with acute cor pulmonale: Secondary | ICD-10-CM

## 2017-04-02 LAB — BASIC METABOLIC PANEL
Anion gap: 13 (ref 5–15)
BUN: 10 mg/dL (ref 6–20)
CO2: 27 mmol/L (ref 22–32)
Calcium: 9.2 mg/dL (ref 8.9–10.3)
Chloride: 99 mmol/L — ABNORMAL LOW (ref 101–111)
Creatinine, Ser: 0.63 mg/dL (ref 0.44–1.00)
GFR calc Af Amer: >60 mL/min (ref >60)
Glucose, Bld: 196 mg/dL — ABNORMAL HIGH (ref 65–99)
Potassium: 3.7 mmol/L (ref 3.5–5.1)
SODIUM: 139 mmol/L (ref 135–145)

## 2017-04-02 LAB — CBC
HCT: 44.6 % (ref 36.0–46.0)
Hemoglobin: 14.9 g/dL (ref 12.0–15.0)
MCH: 32.1 pg (ref 26.0–34.0)
MCHC: 33.4 g/dL (ref 30.0–36.0)
MCV: 96.1 fL (ref 78.0–100.0)
PLATELETS: 122 10*3/uL — AB (ref 150–400)
RBC: 4.64 MIL/uL (ref 3.87–5.11)
RDW: 13.5 % (ref 11.5–15.5)
WBC: 9 10*3/uL (ref 4.0–10.5)

## 2017-04-02 LAB — ECHOCARDIOGRAM COMPLETE
Height: 67.5 in
Weight: 3136 oz

## 2017-04-02 LAB — HEPARIN LEVEL (UNFRACTIONATED)
HEPARIN UNFRACTIONATED: 0.61 [IU]/mL (ref 0.30–0.70)
Heparin Unfractionated: 1.24 IU/mL — ABNORMAL HIGH (ref 0.30–0.70)

## 2017-04-02 MED ORDER — HEPARIN (PORCINE) IN NACL 100-0.45 UNIT/ML-% IJ SOLN
1050.0000 [IU]/h | INTRAMUSCULAR | Status: DC
  Administered 2017-04-02: 1000 [IU]/h via INTRAVENOUS
  Filled 2017-04-02: qty 250

## 2017-04-02 MED ORDER — BENZONATATE 100 MG PO CAPS
100.0000 mg | ORAL_CAPSULE | Freq: Three times a day (TID) | ORAL | Status: DC | PRN
  Administered 2017-04-02: 100 mg via ORAL
  Filled 2017-04-02: qty 1

## 2017-04-02 NOTE — Progress Notes (Signed)
  Echocardiogram 2D Echocardiogram has been performed.  [name] M 04/02/2017, 12:04 PM

## 2017-04-02 NOTE — Progress Notes (Signed)
ANTICOAGULATION CONSULT NOTE - Follow Up Consult  Pharmacy Consult for Heparin  Indication: pulmonary embolus  Allergies  Allergen Reactions  . Oxycodone Hcl [Oxycodone Hcl] Rash  . Morphine Sulfate Other (See Comments)    REACTION: very anxious  . Sulfamethoxazole Other (See Comments)    REACTION: unspecified    Patient Measurements: Height: 5' 7.5" (171.5 cm) Weight: 196 lb (88.9 kg) IBW/kg (Calculated) : 62.75  Vital Signs: Temp: 98 F (36.7 C) (02/15 1400) Temp Source: Axillary (02/15 1400) BP: 133/73 (02/15 1400)  Labs: Recent Labs    04/01/17 1607 04/02/17 0302 04/02/17 1503  HGB 15.2* 14.9  --   HCT 46.5* 44.6  --   PLT 127* 122*  --   HEPARINUNFRC  --  1.24* 0.61  CREATININE 0.66 0.63  --     Estimated Creatinine Clearance: 59.4 mL/min (by C-G formula based on SCr of 0.63 mg/dL).   Assessment: 84 y/o F on heparin drip for new onset PE. Heparin level 0.61 is at goal, heparin drip rate 1000 uts/hr.   plts on the low side but stable from the previous day,h /h ok, no issues per RN.   Goal of Therapy:  Heparin level 0.3-0.7 units/ml Monitor platelets by anticoagulation protocol: Yes   Plan:  Continue heparin drip 1000 units/hr  Daily HL, CBC   Bonnita Nasuti Pharm.D. CPP, BCPS Clinical Pharmacist [phone] 04/02/2017 5:48 PM

## 2017-04-02 NOTE — Care Management Note (Addendum)
Case Management Note  Patient Details  Name: [name] MRN: [id number] Date of Birth: 1930/05/29  Subjective/Objective:                 Patient admitted from [facility]. CSW following.   Benefit check sent for Xaralto 15 mg BID for 21 days then 20 mg once a day. Eliquis 10 mg  bid for 7 days then 5mg  twice daily   Action/Plan:   Expected Discharge Date:  04/03/17               Expected Discharge Plan:  [place]  In-House Referral:     Discharge planning Services     Post Acute Care Choice:    Choice offered to:     DME Arranged:    DME Agency:     HH Arranged:    [place] Agency:     Status of Service:  In process, will continue to follow  If discussed at Long Length of Stay Meetings, dates discussed:    Additional Comments:  Carles Collet, RN 04/02/2017, 3:31 PM

## 2017-04-02 NOTE — Progress Notes (Signed)
ANTICOAGULATION CONSULT NOTE - Follow Up Consult  Pharmacy Consult for Heparin  Indication: pulmonary embolus  Allergies  Allergen Reactions  . Oxycodone Hcl [Oxycodone Hcl] Rash  . Morphine Sulfate Other (See Comments)    REACTION: very anxious  . Sulfamethoxazole Other (See Comments)    REACTION: unspecified    Patient Measurements: Height: 5' 7.5" (171.5 cm) Weight: 196 lb (88.9 kg) IBW/kg (Calculated) : 62.75  Vital Signs: Temp: 97.6 F (36.4 C) (02/15 0446) Temp Source: Axillary (02/15 0446) BP: 140/81 (02/15 0446) Pulse Rate: 75 (02/15 0446)  Labs: Recent Labs    04/01/17 1607 04/02/17 0302  HGB 15.2* 14.9  HCT 46.5* 44.6  PLT 127* 122*  HEPARINUNFRC  --  1.24*  CREATININE 0.66 0.63    Estimated Creatinine Clearance: 59.4 mL/min (by C-G formula based on SCr of 0.63 mg/dL).   Assessment: 84 y/o F on heparin drip for new onset PE, heparin level is elevated this AM, plts on the low side but stable from the previous day, no issues per RN.   Goal of Therapy:  Heparin level 0.3-0.7 units/ml Monitor platelets by anticoagulation protocol: Yes   Plan:  Hold heparin x 1hr Re-start heparin at 1000 units/hr at 0630 1430 HL  Narda Bonds 04/02/2017,5:35 AM

## 2017-04-02 NOTE — ED Notes (Signed)
Report attempted x 1

## 2017-04-02 NOTE — Plan of Care (Signed)
  Progressing Pain Managment: General experience of comfort will improve 04/02/2017 0414 - Progressing by Colonel Bald, RN Note Denies c/o pain or discomfort. Phase I Progression Outcomes Pain controlled with appropriate interventions 04/02/2017 0414 - Progressing by Colonel Bald, RN Note Pain controlled with PRN pain meds. Dyspnea controlled at rest (PE) 04/02/2017 0414 - Progressing by Colonel Bald, RN Note No s/s of dyspnea at rest.  Pt states that her breathing has improved since admission. Voiding-avoid urinary catheter unless indicated 04/02/2017 0414 - Progressing by Colonel Bald, RN Note Voiding without difficulty.

## 2017-04-02 NOTE — Progress Notes (Signed)
PROGRESS NOTE    [name]  CHY:[id number] DOB: May 22, 1930 DOA: 04/01/2017 PCP: Reynold Bowen, MD   Brief Narrative:  84 year old female with history of breast cancer, asthma, depression, COPD came to the hospital with complains of shortness of breath.  She was recently discharged after being treated for COPD exacerbation.  Upon admission she was found to have bilateral pulmonary embolism with right heart strain, started on heparin drip.   Assessment & Plan:   Active Problems:   Acute respiratory failure with hypoxia (HCC)  Acute respiratory distress Bilateral pulmonary embolism with cor pulmonale -Continue the patient on heparin drip - Check factor V Leiden, states her daughter has factor V Leiden deficiency - Check and lower extremity Dopplers and echocardiogram -Supplemental oxygen as necessary -When she feels little stronger she will need ambulatory pulse ox -We will eventually transition her to NOAC  COPD, stable - Continue home medications nebulizers-DuoNeb and Dulera  Essential hypertension -Continue metoprolol 12.5 mg  History of CAD, stable -Continue aspirin, metoprolol 12.5 mg  History of depression -Continue Lexapro 10 mg  Peripheral neuropathy -On gabapentin 300 mg 3 times daily  DVT prophylaxis: Heparin drip Code Status: Full code Family Communication: None at bedside Disposition Plan: To be determined  Consultants:   None  Procedures:   None     Subjective: Denies any complaints, reports her shortness of breath is little better at rest.  She has not ambulated yet. Denies any long distance traveling.  Does have history of breast cancer.  She also states her daughter has factor V Leiden deficiency.  Objective: Vitals:   04/02/17 0030 04/02/17 0045 04/02/17 0158 04/02/17 0446  BP: 115/75 136/84 131/78 140/81  Pulse: 84 87 83 75  Resp: 20 (!) 22 20 18   Temp:   97.9 F (36.6 C) 97.6 F (36.4 C)  TempSrc:   Oral Axillary  SpO2: 96%  97% 97% 95%  Weight:   86.5 kg (190 lb 11.2 oz) 88.9 kg (196 lb)  Height:   5' 7.5" (1.715 m)     Intake/Output Summary (Last 24 hours) at 04/02/2017 0903 Last data filed at 04/02/2017 0700 Gross per 24 hour  Intake 3.67 ml  Output 1100 ml  Net -1096.33 ml   Filed Weights   04/02/17 0158 04/02/17 0446  Weight: 86.5 kg (190 lb 11.2 oz) 88.9 kg (196 lb)    Examination:  General exam: Appears calm and comfortable  Respiratory system: Slightly diminished breath sounds at the bases Cardiovascular system: S1 & S2 heard, RRR. No JVD, murmurs, rubs, gallops or clicks. No pedal edema. Gastrointestinal system: Abdomen is nondistended, soft and nontender. No organomegaly or masses felt. Normal bowel sounds heard. Central nervous system: Alert and oriented. No focal neurological deficits. Extremities: Symmetric 5 x 5 power. Skin: No rashes, lesions or ulcers Psychiatry: Judgement and insight appear normal. Mood & affect appropriate.     Data Reviewed:   CBC: Recent Labs  Lab 04/01/17 1607 04/02/17 0302  WBC 12.3* 9.0  HGB 15.2* 14.9  HCT 46.5* 44.6  MCV 97.1 96.1  PLT 127* 878*   Basic Metabolic Panel: Recent Labs  Lab 04/01/17 1607 04/02/17 0302  NA 136 139  K 4.5 3.7  CL 102 99*  CO2 25 27  GLUCOSE 134* 196*  BUN 12 10  CREATININE 0.66 0.63  CALCIUM 9.1 9.2   GFR: Estimated Creatinine Clearance: 59.4 mL/min (by C-G formula based on SCr of 0.63 mg/dL). Liver Function Tests: No results for input(s): AST, ALT,  ALKPHOS, BILITOT, PROT, ALBUMIN in the last 168 hours. No results for input(s): LIPASE, AMYLASE in the last 168 hours. No results for input(s): AMMONIA in the last 168 hours. Coagulation Profile: No results for input(s): INR, PROTIME in the last 168 hours. Cardiac Enzymes: No results for input(s): CKTOTAL, CKMB, CKMBINDEX, TROPONINI in the last 168 hours. BNP (last 3 results) No results for input(s): PROBNP in the last 8760 hours. HbA1C: No results for  input(s): HGBA1C in the last 72 hours. CBG: No results for input(s): GLUCAP in the last 168 hours. Lipid Profile: No results for input(s): CHOL, HDL, LDLCALC, TRIG, CHOLHDL, LDLDIRECT in the last 72 hours. Thyroid Function Tests: No results for input(s): TSH, T4TOTAL, FREET4, T3FREE, THYROIDAB in the last 72 hours. Anemia Panel: No results for input(s): VITAMINB12, FOLATE, FERRITIN, TIBC, IRON, RETICCTPCT in the last 72 hours. Sepsis Labs: No results for input(s): PROCALCITON, LATICACIDVEN in the last 168 hours.  No results found for this or any previous visit (from the past 240 hour(s)).       Radiology Studies: Ct Head Wo Contrast  Result Date: 04/01/2017 CLINICAL DATA:  84 year old female status post fall. Generalized weakness, but greater on the right. EXAM: CT HEAD WITHOUT CONTRAST TECHNIQUE: Contiguous axial images were obtained from the base of the skull through the vertex without intravenous contrast. COMPARISON:  Head and cervical spine CT 03/14/2017 and earlier. FINDINGS: Brain: Stable cerebral volume. Stable mild ventricular prominence and mild for age patchy bilateral cerebral white matter hypodensity. No midline shift, ventriculomegaly, mass effect, evidence of mass lesion, intracranial hemorrhage or evidence of cortically based acute infarction. No cortical encephalomalacia identified. Vascular: Calcified atherosclerosis at the skull base. No suspicious intracranial vascular hyperdensity. Skull: No acute osseous abnormality identified. Sinuses/Orbits: Clear. Other: No acute scalp or orbits soft tissue findings. IMPRESSION: No acute intracranial abnormality or acute traumatic injury identified. Stable non contrast CT appearance of the brain. Electronically Signed   By: Genevie Ann M.D.   On: 04/01/2017 17:54   Ct Angio Chest Pe W And/or Wo Contrast  Result Date: 04/01/2017 CLINICAL DATA:  84 year old female with weakness and shortness of breath for 2 weeks. EXAM: CT ANGIOGRAPHY  CHEST WITH CONTRAST TECHNIQUE: Multidetector CT imaging of the chest was performed using the standard protocol during bolus administration of intravenous contrast. Multiplanar CT image reconstructions and MIPs were obtained to evaluate the vascular anatomy. CONTRAST:  131mL ISOVUE-370 IOPAMIDOL (ISOVUE-370) INJECTION 76% COMPARISON:  Portable chest radiograph 1657 hr today and earlier. Chest CTA 08/09/2016. FINDINGS: Cardiovascular: Excellent contrast bolus timing in the pulmonary arterial tree. Positive for bilateral pulmonary emboli. Left hilar thrombus extends into the upper lobe, lingula, and some lower lobe branches. No saddle embolus. Distal right main pulmonary artery thrombus extends into multiple right upper lobe branches, some of which are occluded. Thrombus also extends into multiple right lower lobe pulmonary artery branches. RV / LV ratio = 1.0. No pericardial effusion. Negative visualized aorta aside from atherosclerosis. Mediastinum/Nodes: No mediastinal lymphadenopathy. Lungs/Pleura: Major airways are patent. There is mild scattered peripheral right upper lobe ground-glass opacity, mostly in the anterior right lung apex. There is occasional distal peribronchial ground-glass opacity and nodularity in the inferior right upper lobe and superior right lower lobe. Superimposed peripheral ground-glass opacity in the inferior bilateral middle and lower lobes appears to be chronic scarring. No acute pulmonary opacity in the left lung. No pleural effusion. Upper Abdomen: Stable and negative visualized upper abdominal viscera. Musculoskeletal: Partially visible lower cervical spine fusion hardware. No acute osseous  abnormality identified. Review of the MIP images confirms the above findings. IMPRESSION: 1. Positive for acute PE with CT evidence of possible right heart strain (RV/LV Ratio = 1.0) suggesting intermediate risk PE. The presence of right heart strain has been associated with an increased risk of  morbidity and mortality. Consider activation of code PE by paging [phone]. 2. No pleural or pericardial effusion. 3. Scattered small areas of nonspecific peripheral ground-glass opacity in the right lung might indicate small areas of pulmonary infarct. Superimposed chronic bilateral lung base scarring. Critical Value/emergent results were called by telephone at the time of interpretation on 04/01/2017 at 6:02 pm to Dr. Willette Alma , who verbally acknowledged these results. Electronically Signed   By: Genevie Ann M.D.   On: 04/01/2017 18:04   Dg Chest Port 1 View  Result Date: 04/01/2017 CLINICAL DATA:  Weakness.  Shortness of breath. EXAM: PORTABLE CHEST 1 VIEW COMPARISON:  03/22/2017. FINDINGS: Mediastinum hilar structures normal. Heart size normal. Low lung volumes. Bibasilar atelectasis. No pleural effusion or pneumothorax prior cervicothoracic spine fusion. IMPRESSION: Low lung volumes with mild bibasilar atelectasis. Electronically Signed   By: Marcello Moores  Register   On: 04/01/2017 17:06        Scheduled Meds: . aspirin EC  325 mg Oral Daily  . buPROPion  300 mg Oral Daily  . docusate sodium  100 mg Oral Daily  . escitalopram  10 mg Oral Daily  . fluticasone  1 spray Each Nare BID  . gabapentin  300 mg Oral TID  . metoprolol tartrate  12.5 mg Oral BID  . mirabegron ER  50 mg Oral Daily  . mometasone-formoterol  1 puff Inhalation BID  . montelukast  10 mg Oral QHS  . polyvinyl alcohol  1 drop Both Eyes BID  . sodium chloride flush  3 mL Intravenous Q12H   Continuous Infusions: . heparin 1,000 Units/hr (04/02/17 8295)     LOS: 1 day    Time spent: 30 minutes    Allaya Abbasi Arsenio Loader, MD [facility] Pager [phone]   If 7PM-7AM, please contact night-coverage [email or web address] Password [facility] 04/02/2017, 9:03 AM

## 2017-04-02 NOTE — NC FL2 (Signed)
[place] LEVEL OF CARE SCREENING TOOL     IDENTIFICATION  Patient Name: [name] Birthdate: October 02, 1930 Sex: female Admission Date (Current Location): 04/01/2017  [facility] and [place] Number:  Herbalist and Address:  [facility], [place] [address], [place], [place] [address]      Provider Number: [id number]  Attending Physician Name and Address:  Damita Lack, MD  Relative Name and Phone Number:       Current Level of Care: Hospital Recommended Level of Care: [place] Prior Approval Number:    Date Approved/Denied:   PASRR Number: [id number]  Discharge Plan: SNF    Current Diagnoses: Patient Active Problem List   Diagnosis Date Noted  . Acute respiratory failure with hypoxia ([place]) 04/01/2017  . Laceration of forehead   . Acute on chronic respiratory failure with hypoxia ([place]) 03/15/2017  . Fall 03/15/2017  . Depression with anxiety 03/15/2017  . COPD exacerbation ([place]) 03/15/2017  . COPD with acute exacerbation ([place])   . Acute lower UTI   . Sepsis ([place]) 11/08/2016  . Morbid obesity due to excess calories ([place]) 09/02/2016  . Esophageal dysfunction 09/01/2016  . PNA (pneumonia) 08/09/2016  . Wheelchair bound 08/09/2016  . Wound healing, delayed 08/09/2016  . HCAP (healthcare-associated pneumonia) 07/07/2016  . Chronic ulcer of right leg ([place]) 07/07/2016  . SIRS (systemic inflammatory response syndrome) ([place]) 05/08/2016  . Community acquired pneumonia 05/08/2016  . Failed total hip arthroplasty ([place]) 07/10/2015  . Breast cancer, right breast ([place]) 07/18/2014  . CONCUSSION WITH LOC OF UNSPECIFIED DURATION 07/19/2009  . OTH SYMPTOMS INVOLVING RESPIRATORY SYSTEM&CHEST 11/12/2008  . UNSPECIFIED DISORDER OF ANKLE AND FOOT JOINT 05/11/2008  . CERUMEN IMPACTION, BILATERAL 05/04/2008  . Dysmetabolic syndrome X 51/03/5850  . UNS ADVRS EFF UNS RX MEDICINAL&BIOLOGICAL SBSTNC 08/12/2007  . Other and  unspecified hyperlipidemia 04/01/2007  . INSOMNIA, PERSISTENT 09/09/2006  . Essential hypertension 09/09/2006  . DIVERTICULOSIS, COLON 09/09/2006  . Osteoarthritis 09/09/2006  . Spinal stenosis 09/09/2006  . COLONIC POLYPS, HX OF 09/09/2006    Orientation RESPIRATION BLADDER Height & Weight     Self, Time, Situation, Place  O2(Nasal Cannula, 2.5 L) Continent Weight: 196 lb (88.9 kg) Height:  5' 7.5" (171.5 cm)  BEHAVIORAL SYMPTOMS/MOOD NEUROLOGICAL BOWEL NUTRITION STATUS      Continent (Please see d/c summary)  AMBULATORY STATUS COMMUNICATION OF NEEDS Skin   Limited Assist Verbally Normal                       Personal Care Assistance Level of Assistance  Feeding, Dressing, Bathing Bathing Assistance: Limited assistance Feeding assistance: Independent Dressing Assistance: Limited assistance     Functional Limitations Info  Sight, Hearing, Speech Sight Info: Adequate   Speech Info: Adequate    SPECIAL CARE FACTORS FREQUENCY  OT (By licensed OT), PT (By licensed PT)     PT Frequency: 5x week OT Frequency: 5x week            Contractures Contractures Info: Not present    Additional Factors Info  Code Status, Allergies Code Status Info: FULL CODE Allergies Info: Oxycodone Hcl Oxycodone Hcl, Morphine Sulfate, Sulfamethoxazole     Isolation Precautions Info: MRSA     Current Medications (04/02/2017):  This is the current hospital active medication list Current Facility-Administered Medications  Medication Dose Route Frequency Provider Last Rate Last Dose  . acetaminophen (TYLENOL) tablet 650 mg  650 mg Oral Q6H PRN Aggie Moats,  Layne Benton, MD       Or  . acetaminophen (TYLENOL) suppository 650 mg  650 mg Rectal Q6H PRN Elwin Mocha, MD      . albuterol (PROVENTIL) (2.5 MG/3ML) 0.083% nebulizer solution 2.5 mg  2.5 mg Nebulization Q2H PRN Elwin Mocha, MD      . aspirin EC tablet 325 mg  325 mg Oral Daily Elwin Mocha, MD   325 mg at 04/02/17 1003  .  buPROPion (WELLBUTRIN XL) 24 hr tablet 300 mg  300 mg Oral Daily Elwin Mocha, MD   300 mg at 04/02/17 1003  . clonazePAM (KLONOPIN) tablet 1 mg  1 mg Oral QHS PRN Elwin Mocha, MD   1 mg at 04/02/17 0241  . docusate sodium (COLACE) capsule 100 mg  100 mg Oral Daily Elwin Mocha, MD   100 mg at 04/02/17 1003  . escitalopram (LEXAPRO) tablet 10 mg  10 mg Oral Daily Elwin Mocha, MD   10 mg at 04/02/17 1003  . fluticasone (FLONASE) 50 MCG/ACT nasal spray 1 spray  1 spray Each Nare BID Elwin Mocha, MD   1 spray at 04/02/17 1030  . gabapentin (NEURONTIN) capsule 300 mg  300 mg Oral TID Elwin Mocha, MD   300 mg at 04/02/17 1504  . heparin ADULT infusion 100 units/mL (25000 units/292mL sodium chloride 0.45%)  1,000 Units/hr Intravenous Continuous Erenest Blank, RPH 10 mL/hr at 04/02/17 1434 1,000 Units/hr at 04/02/17 1434  . HYDROmorphone (DILAUDID) injection 0.5 mg  0.5 mg Intravenous Q3H PRN Elwin Mocha, MD      . methocarbamol (ROBAXIN) tablet 500 mg  500 mg Oral Q6H PRN Elwin Mocha, MD   500 mg at 04/02/17 0241  . metoprolol tartrate (LOPRESSOR) tablet 12.5 mg  12.5 mg Oral BID Elwin Mocha, MD   12.5 mg at 04/02/17 1003  . mirabegron ER (MYRBETRIQ) tablet 50 mg  50 mg Oral Daily Elwin Mocha, MD   50 mg at 04/02/17 1203  . mometasone-formoterol (DULERA) 200-5 MCG/ACT inhaler 1 puff  1 puff Inhalation BID Elwin Mocha, MD   1 puff at 04/02/17 1130  . montelukast (SINGULAIR) tablet 10 mg  10 mg Oral QHS Elwin Mocha, MD   10 mg at 04/02/17 0233  . ondansetron (ZOFRAN) tablet 4 mg  4 mg Oral Q6H PRN Elwin Mocha, MD       Or  . ondansetron [facility]) injection 4 mg  4 mg Intravenous Q6H PRN Elwin Mocha, MD      . polyvinyl alcohol (LIQUIFILM TEARS) 1.4 % ophthalmic solution 1 drop  1 drop Both Eyes BID Elwin Mocha, MD   1 drop at 04/02/17 1030  . sodium chloride flush (NS) 0.9 % injection 3 mL  3 mL Intravenous Q12H Elwin Mocha, MD          Discharge Medications: Please see discharge summary for a list of discharge medications.  Relevant Imaging Results:  Relevant Lab Results:   Additional Information SS#: [id number].  Bridget A Cobb, LCSW

## 2017-04-03 ENCOUNTER — Inpatient Hospital Stay (HOSPITAL_COMMUNITY): Payer: Medicare Other

## 2017-04-03 DIAGNOSIS — I2699 Other pulmonary embolism without acute cor pulmonale: Secondary | ICD-10-CM

## 2017-04-03 DIAGNOSIS — R0902 Hypoxemia: Secondary | ICD-10-CM

## 2017-04-03 LAB — CBC
HCT: 41.7 % (ref 36.0–46.0)
Hemoglobin: 13.6 g/dL (ref 12.0–15.0)
MCH: 31.4 pg (ref 26.0–34.0)
MCHC: 32.6 g/dL (ref 30.0–36.0)
MCV: 96.3 fL (ref 78.0–100.0)
PLATELETS: 151 10*3/uL (ref 150–400)
RBC: 4.33 MIL/uL (ref 3.87–5.11)
RDW: 13.6 % (ref 11.5–15.5)
WBC: 10.9 10*3/uL — ABNORMAL HIGH (ref 4.0–10.5)

## 2017-04-03 LAB — HEPARIN LEVEL (UNFRACTIONATED): Heparin Unfractionated: 0.42 IU/mL (ref 0.30–0.70)

## 2017-04-03 MED ORDER — APIXABAN 5 MG PO TABS
10.0000 mg | ORAL_TABLET | Freq: Two times a day (BID) | ORAL | Status: DC
  Administered 2017-04-03 – 2017-04-04 (×3): 10 mg via ORAL
  Filled 2017-04-03 (×3): qty 2

## 2017-04-03 NOTE — Progress Notes (Signed)
PT Cancellation Note  Patient Details Name: [name] MRN: [id number] DOB: February 23, 1930   Cancelled Treatment:    Reason Eval/Treat Not Completed: Other (comment). Pt reports headache and needs to rest. Will try later.   [place] 04/03/2017, 1:59 PM [place]

## 2017-04-03 NOTE — Plan of Care (Addendum)
  Clinical Measurements: Respiratory complications will improve. Pt maintained 02 WNL on 2L [place] throughout shift. Symmetrical, unlabored breathing on assessment. Clear in upper lobes with diminished bases. Pt has a cough and requested tessalon pearls with her nightly medications. Medication was effective at reducing cough. Will continue to monitor and assess pt's respiratory status this shift.   Pt repositioned q2h. Pillows used to support bony prominences. New foam pad placed on sacrum. No new injuries or skin breakdown present this shift. Will continue to monitor and assess.  04/03/2017 0437 - Progressing by Mellissa Kohut, RN   Elimination: Will not experience complications related to bowel motility and urinary output. Pt states that she had a BM 8/76 without complication. Pt had adequate urinary output this shift. No other GU issues reported at this time.  04/03/2017 0437 - Progressing by Mellissa Kohut, RN

## 2017-04-03 NOTE — Progress Notes (Signed)
Patient on 2L of oxygen. MD removed oxygen and patient sustained oxygen level of 98%. Per MD place oxygen at 2L if patient destats to 88% sustained.

## 2017-04-03 NOTE — Progress Notes (Signed)
LE venous duplex prelim: No DVT RLE. Positive for DVT involving the Left femoral, popliteal, posterior tibial, and peroneal veins. Landry Mellow, RDMS, RVT Called results to patient's nurse

## 2017-04-03 NOTE — Progress Notes (Signed)
ANTICOAGULATION CONSULT NOTE - Follow Up Consult  Pharmacy Consult for Heparin  Indication: pulmonary embolus  Patient Measurements: Height: 5' 7.5" (171.5 cm) Weight: 196 lb 4.8 oz (89 kg) IBW/kg (Calculated) : 62.75  Labs: Recent Labs    04/01/17 1607 04/02/17 0302 04/02/17 1503 04/03/17 0419  HGB 15.2* 14.9  --  13.6  HCT 46.5* 44.6  --  41.7  PLT 127* 122*  --  151  HEPARINUNFRC  --  1.24* 0.61 0.42  CREATININE 0.66 0.63  --   --     Estimated Creatinine Clearance: 59.5 mL/min (by C-G formula based on SCr of 0.63 mg/dL).  Assessment: 84 y/o F on heparin drip for new onset PE. Heparin level remains at goal. Hgb stable, PLTs up to 151 from 122, no bleeding noted.  Due to the quick downward trend, will bump the heparin gtt 50units (~1u/kg/hr) to prevent subtherapeutic levels tomorrow.  Goal of Therapy:  Heparin level 0.3-0.7 units/ml Monitor platelets by anticoagulation protocol: Yes   Plan:  Increase heparin drip to 1050 units/hr  Daily HL, [place] PharmD PGY1 Pharmacy Practice Resident 04/03/2017 8:35 AM Pager: [phone] Phone: [phone]

## 2017-04-03 NOTE — Progress Notes (Signed)
PROGRESS NOTE    [name]  SAY:[id number] DOB: 1930/10/04 DOA: 04/01/2017 PCP: Reynold Bowen, MD   Brief Narrative:  [name]  breast cancer  post right lumpectomy and lymph node dissection 04/1998 [id number] stage Ia ductal CA--last mammogram December 2018 per patient was normal Bipolar Prior right hip surgery wheelchair-bound previously spinal stenosis with chronic low back pain and immobility since last hospital admission COPD quit smoking in 1992/(asthma since age [age])  Came to the hospital with complains of shortness of breath.  She was recently discharged after being treated for COPD exacerbation 2/4-03/24/17.  And prior to that earlier in the year 1/20 and 7-1/29/19 with similar respiratory complaints quiring BiPAP at that time Prior right-sided pneumonia 10/2016 Upon admission she was found to have bilateral pulmonary embolism with right heart strain, started on heparin drip.   Assessment & Plan:   Active Problems:   Acute respiratory failure with hypoxia (HCC)  Acute respiratory distress Bilateral pulmonary embolism with cor pulmonale - heparin drip transition to Eliquis 10 mg twice daily 2/16-->2/23 then 5 mg twice daily -Await factor V Leiden testing performed -Lower extremity Dopplers ordered and pending --Echocardiogram done shows EF 55-60% with no mention of any asynchrony right heart strain  COPD, stable - Continue home medications nebulizers-DuoNeb and Dulera, continue Tessalon 100 3 times daily, Dulera 1 puff twice daily, Singulair 10 at bedtime  Essential hypertension -Continue metoprolol 12.5 mg  History of CAD, stable -Continue aspirin, metoprolol 12.5 mg twice daily  History of depression -Continue Lexapro 10 mg, Wellbutrin 300 daily  Peripheral neuropathy, history of spinal stenosis and lumbar surgery, history of cervical stenosis not operative candidate -On gabapentin 300 mg 3 times daily, continue Robaxin 500 every 6 as needed-using IV  Dilaudid 0.5 every 3 as needed for breakthrough -States that was not able to ambulate post discharge at [place] facility from earlier this month and this may be the trigger as to why patient developed a DVT we will ask therapy to evaluate the patient  History of breast cancer status post lumpectomy sentinel node dissection 2005 -Outpatient oncology follow-up especially given venous thromboembolism  Pruritus upper extremities from may be affecting lower extremities as well?  DVT prophylaxis: Heparin drip Code Status: Full code Family Communication: None at bedside Disposition Plan: Planning to return to [facility] facility in the next 24-48 hours once Dopplers are performed we will get therapy evaluation prior to the same  Consultants:   None  Procedures:   Echo Study Conclusions  - Left ventricle: abnormal septal motion The cavity size was   normal. Systolic function was normal. The estimated ejection   fraction was in the range of 55% to 60%. Wall motion was normal;   there were no regional wall motion abnormalities. Doppler   parameters are consistent with abnormal left ventricular   relaxation (grade 1 diastolic dysfunction). - Aortic valve: Calcified non coronary cusp. There was trivial   regurgitation. - Right ventricle: The cavity size was mildly dilated. Wall   thickness was normal. - Atrial septum: No defect or patent foramen ovale was identified. - Pulmonary arteries: PA peak pressure: 39 mm Hg (S).   Subjective:  Awake alert in no pain but tells me he has not walked since last discharge early in February No chest pain no nausea Able to move 4 extremities No focal deficit Mentally very sharp Does not like food   Objective: Vitals:   04/02/17 1400 04/02/17 1935 04/02/17 2200 04/03/17 0435  BP: 133/73 130/66  135/68  Pulse:  92 91 74  Resp: 18 20  20   Temp: 98 F (36.7 C) 98.9 F (37.2 C)  98.1 F (36.7 C)  TempSrc: Axillary Oral  Oral    SpO2:  92% 95% 100%  Weight:    89 kg (196 lb 4.8 oz)  Height:        Intake/Output Summary (Last 24 hours) at 04/03/2017 0851 Last data filed at 04/03/2017 0438 Gross per 24 hour  Intake 791.33 ml  Output 1200 ml  Net -408.67 ml   Filed Weights   04/02/17 0158 04/02/17 0446 04/03/17 0435  Weight: 86.5 kg (190 lb 11.2 oz) 88.9 kg (196 lb) 89 kg (196 lb 4.8 oz)    Examination:  Awake alert pleasant looks about stated age S1-S2 no murmur rub or gallop Wheezy bilaterally with some crackles Abdomen soft nontender no rebound no guarding Fingers of both extremities have contracture deformities and lower extremities and toes have some clawing of the toes reflexes are brisk 3/3 power is reasonable in both lower extremities able to flex knee to chest postoperative scar over right knee sensory is grossly intact Babinski deferred   Data Reviewed:   CBC: Recent Labs  Lab 04/01/17 1607 04/02/17 0302 04/03/17 0419  WBC 12.3* 9.0 10.9*  HGB 15.2* 14.9 13.6  HCT 46.5* 44.6 41.7  MCV 97.1 96.1 96.3  PLT 127* 122* 622   Basic Metabolic Panel: Recent Labs  Lab 04/01/17 1607 04/02/17 0302  NA 136 139  K 4.5 3.7  CL 102 99*  CO2 25 27  GLUCOSE 134* 196*  BUN 12 10  CREATININE 0.66 0.63  CALCIUM 9.1 9.2   GFR: Estimated Creatinine Clearance: 59.5 mL/min (by C-G formula based on SCr of 0.63 mg/dL). Liver Function Tests: No results for input(s): AST, ALT, ALKPHOS, BILITOT, PROT, ALBUMIN in the last 168 hours. No results for input(s): LIPASE, AMYLASE in the last 168 hours. No results for input(s): AMMONIA in the last 168 hours. Coagulation Profile: No results for input(s): INR, PROTIME in the last 168 hours. Cardiac Enzymes: No results for input(s): CKTOTAL, CKMB, CKMBINDEX, TROPONINI in the last 168 hours. BNP (last 3 results) No results for input(s): PROBNP in the last 8760 hours. HbA1C: No results for input(s): HGBA1C in the last 72 hours. CBG: No results for input(s):  GLUCAP in the last 168 hours. Lipid Profile: No results for input(s): CHOL, HDL, LDLCALC, TRIG, CHOLHDL, LDLDIRECT in the last 72 hours. Thyroid Function Tests: No results for input(s): TSH, T4TOTAL, FREET4, T3FREE, THYROIDAB in the last 72 hours. Anemia Panel: No results for input(s): VITAMINB12, FOLATE, FERRITIN, TIBC, IRON, RETICCTPCT in the last 72 hours. Sepsis Labs: No results for input(s): PROCALCITON, LATICACIDVEN in the last 168 hours.  No results found for this or any previous visit (from the past 240 hour(s)).       Radiology Studies: Ct Head Wo Contrast  Result Date: 04/01/2017 CLINICAL DATA:  84 year old female status post fall. Generalized weakness, but greater on the right. EXAM: CT HEAD WITHOUT CONTRAST TECHNIQUE: Contiguous axial images were obtained from the base of the skull through the vertex without intravenous contrast. COMPARISON:  Head and cervical spine CT 03/14/2017 and earlier. FINDINGS: Brain: Stable cerebral volume. Stable mild ventricular prominence and mild for age patchy bilateral cerebral white matter hypodensity. No midline shift, ventriculomegaly, mass effect, evidence of mass lesion, intracranial hemorrhage or evidence of cortically based acute infarction. No cortical encephalomalacia identified. Vascular: Calcified atherosclerosis at the skull base. No suspicious  intracranial vascular hyperdensity. Skull: No acute osseous abnormality identified. Sinuses/Orbits: Clear. Other: No acute scalp or orbits soft tissue findings. IMPRESSION: No acute intracranial abnormality or acute traumatic injury identified. Stable non contrast CT appearance of the brain. Electronically Signed   By: Genevie Ann M.D.   On: 04/01/2017 17:54   Ct Angio Chest Pe W And/or Wo Contrast  Result Date: 04/01/2017 CLINICAL DATA:  84 year old female with weakness and shortness of breath for 2 weeks. EXAM: CT ANGIOGRAPHY CHEST WITH CONTRAST TECHNIQUE: Multidetector CT imaging of the chest was  performed using the standard protocol during bolus administration of intravenous contrast. Multiplanar CT image reconstructions and MIPs were obtained to evaluate the vascular anatomy. CONTRAST:  149mL ISOVUE-370 IOPAMIDOL (ISOVUE-370) INJECTION 76% COMPARISON:  Portable chest radiograph 1657 hr today and earlier. Chest CTA 08/09/2016. FINDINGS: Cardiovascular: Excellent contrast bolus timing in the pulmonary arterial tree. Positive for bilateral pulmonary emboli. Left hilar thrombus extends into the upper lobe, lingula, and some lower lobe branches. No saddle embolus. Distal right main pulmonary artery thrombus extends into multiple right upper lobe branches, some of which are occluded. Thrombus also extends into multiple right lower lobe pulmonary artery branches. RV / LV ratio = 1.0. No pericardial effusion. Negative visualized aorta aside from atherosclerosis. Mediastinum/Nodes: No mediastinal lymphadenopathy. Lungs/Pleura: Major airways are patent. There is mild scattered peripheral right upper lobe ground-glass opacity, mostly in the anterior right lung apex. There is occasional distal peribronchial ground-glass opacity and nodularity in the inferior right upper lobe and superior right lower lobe. Superimposed peripheral ground-glass opacity in the inferior bilateral middle and lower lobes appears to be chronic scarring. No acute pulmonary opacity in the left lung. No pleural effusion. Upper Abdomen: Stable and negative visualized upper abdominal viscera. Musculoskeletal: Partially visible lower cervical spine fusion hardware. No acute osseous abnormality identified. Review of the MIP images confirms the above findings. IMPRESSION: 1. Positive for acute PE with CT evidence of possible right heart strain (RV/LV Ratio = 1.0) suggesting intermediate risk PE. The presence of right heart strain has been associated with an increased risk of morbidity and mortality. Consider activation of code PE by paging  [phone]. 2. No pleural or pericardial effusion. 3. Scattered small areas of nonspecific peripheral ground-glass opacity in the right lung might indicate small areas of pulmonary infarct. Superimposed chronic bilateral lung base scarring. Critical Value/emergent results were called by telephone at the time of interpretation on 04/01/2017 at 6:02 pm to Dr. Willette Alma , who verbally acknowledged these results. Electronically Signed   By: Genevie Ann M.D.   On: 04/01/2017 18:04   Dg Chest Port 1 View  Result Date: 04/01/2017 CLINICAL DATA:  Weakness.  Shortness of breath. EXAM: PORTABLE CHEST 1 VIEW COMPARISON:  03/22/2017. FINDINGS: Mediastinum hilar structures normal. Heart size normal. Low lung volumes. Bibasilar atelectasis. No pleural effusion or pneumothorax prior cervicothoracic spine fusion. IMPRESSION: Low lung volumes with mild bibasilar atelectasis. Electronically Signed   By: Marcello Moores  Register   On: 04/01/2017 17:06        Scheduled Meds: . aspirin EC  325 mg Oral Daily  . buPROPion  300 mg Oral Daily  . docusate sodium  100 mg Oral Daily  . escitalopram  10 mg Oral Daily  . fluticasone  1 spray Each Nare BID  . gabapentin  300 mg Oral TID  . metoprolol tartrate  12.5 mg Oral BID  . mirabegron ER  50 mg Oral Daily  . mometasone-formoterol  1 puff Inhalation BID  .  montelukast  10 mg Oral QHS  . polyvinyl alcohol  1 drop Both Eyes BID  . sodium chloride flush  3 mL Intravenous Q12H   Continuous Infusions: . heparin 1,000 Units/hr (04/02/17 1434)     LOS: 2 days    Time spent: 30 minutes    Verneita Griffes, MD [facility] (P) [phone]   If 7PM-7AM, please contact night-coverage [email or web address] Password [email or web address] 04/03/2017, 8:51 AM

## 2017-04-04 LAB — CBC
HEMATOCRIT: 42.2 % (ref 36.0–46.0)
Hemoglobin: 13.7 g/dL (ref 12.0–15.0)
MCH: 31.6 pg (ref 26.0–34.0)
MCHC: 32.5 g/dL (ref 30.0–36.0)
MCV: 97.5 fL (ref 78.0–100.0)
Platelets: 139 10*3/uL — ABNORMAL LOW (ref 150–400)
RBC: 4.33 MIL/uL (ref 3.87–5.11)
RDW: 14 % (ref 11.5–15.5)
WBC: 9.1 10*3/uL (ref 4.0–10.5)

## 2017-04-04 MED ORDER — ESCITALOPRAM OXALATE 10 MG PO TABS: 10.0000 mg | ORAL_TABLET | Freq: Every day | ORAL | 0 refills | Status: AC

## 2017-04-04 MED ORDER — APIXABAN 5 MG PO TABS: 10.0000 mg | ORAL_TABLET | Freq: Two times a day (BID) | ORAL | 0 refills | Status: DC

## 2017-04-04 MED ORDER — APIXABAN 5 MG PO TABS: 5.0000 mg | ORAL_TABLET | Freq: Two times a day (BID) | ORAL | 12 refills | Status: AC

## 2017-04-04 MED ORDER — HYDROCODONE-ACETAMINOPHEN 5-325 MG PO TABS: 1.0000 | ORAL_TABLET | Freq: Two times a day (BID) | ORAL | 0 refills | Status: DC | PRN

## 2017-04-04 MED ORDER — CLONAZEPAM 1 MG PO TABS: 1.0000 mg | ORAL_TABLET | Freq: Every evening | ORAL | 0 refills | Status: DC | PRN

## 2017-04-04 MED ORDER — BUPROPION HCL ER (XL) 300 MG PO TB24: 300.0000 mg | ORAL_TABLET | ORAL | 0 refills | Status: AC

## 2017-04-04 NOTE — Evaluation (Signed)
Physical Therapy Evaluation Patient Details Name: [name] MRN: [id number] DOB: [birth year] Today's Date: 04/04/2017   History of Present Illness  Pt is an 84 y.o. female who was admitted from [facility] 04/01/17 with respiratory failure and found to have bil PEs and LLE DVT. Recent admissionsfor COPD exacerbations. She has a PMH significant for arthritis, asthma, breast cancer, COPD, depression, diabetes, diverticulosis of colon, endometrial hyperplasia, frequent UTI, hypertension, history of colon polyps, insomnia, osteopenia, spinal stenosis, vitamin D deficiency.     Clinical Impression  Pt admitted with above diagnosis. Pt currently with functional limitations due to the deficits listed below (see PT Problem List).  Pt will benefit from skilled PT to increase their independence and safety with mobility to allow discharge to the venue listed below.       Follow Up Recommendations SNF    Equipment Recommendations  None recommended by PT    Recommendations for Other Services       Precautions / Restrictions Precautions Precautions: Fall Restrictions Weight Bearing Restrictions: No      Mobility  Bed Mobility Overal bed mobility: Needs Assistance Bed Mobility: Supine to Sit;Sit to Supine     Supine to sit: Min assist;HOB elevated(with rail) Sit to supine: Mod assist   General bed mobility comments: assist to elevate trunk, scoot hips to EOB; raise leg onto bed  Transfers Overall transfer level: Needs assistance Equipment used: Rolling walker (2 wheeled);None Transfers: Sit to/from Stand Sit to Stand: Min assist;Mod assist(min with RW; x2 without RW progressing to mod assist ) Stand pivot transfers: (unable with one person assist; pt fearful)       General transfer comment: Attempted 1st wiht RW, however pt unable to advance feet; attempted pivot with one person x 2 and pt could not let go of the bed rail  Ambulation/Gait                Stairs             Wheelchair Mobility    Modified Rankin (Stroke Patients Only)       Balance Overall balance assessment: History of Falls;Needs assistance Sitting-balance support: Bilateral upper extremity supported;Feet supported Sitting balance-Leahy Scale: Poor Sitting balance - Comments: with fatigue, leans to her left Postural control: Left lateral lean Standing balance support: Bilateral upper extremity supported Standing balance-Leahy Scale: Poor                               Pertinent Vitals/Pain Pain Assessment: No/denies pain    Home Living Family/patient expects to be discharged to:: [place]: Wheelchair - manual;Walker - 2 wheels      Prior Function Level of Independence: Needs assistance   Gait / Transfers Assistance Needed: Has been working on transfers and trying to resume ambulation at [employer] with therapy. Has not walked sinced mid-January  ADL's / Homemaking Assistance Needed: Requires assistance for LB and has not been able to shower. Does UB ADL with set-up.         Hand Dominance        Extremity/Trunk Assessment   Upper Extremity Assessment Upper Extremity Assessment: Generalized weakness(AROM WFL bil)    Lower Extremity Assessment Lower Extremity Assessment: RLE deficits/detail;LLE deficits/detail RLE Deficits / Details: AROM WFL; strength grossly 3+ LLE Deficits / Details: AROM WFL; strength grossly 3+  Communication   Communication: No difficulties  Cognition Arousal/Alertness: Awake/alert Behavior During Therapy: WFL for tasks assessed/performed Overall Cognitive Status: Within Functional Limits for tasks assessed                                        General Comments      Exercises     Assessment/Plan    PT Assessment Patient needs continued PT services  PT Problem List Decreased strength;Decreased activity tolerance;Decreased  balance;Decreased mobility;Decreased knowledge of use of DME;Cardiopulmonary status limiting activity       PT Treatment Interventions DME instruction;Gait training;Functional mobility training;Therapeutic activities;Therapeutic exercise;Balance training;Patient/family education    PT Goals (Current goals can be found in the Care Plan section)  Acute Rehab PT Goals Patient Stated Goal: Back to [facility] SNF for rehab PT Goal Formulation: With patient Time For Goal Achievement: 04/18/17 Potential to Achieve Goals: Good    Frequency Min 2X/week   Barriers to discharge Decreased caregiver support ? was in ALF at [facility]; does need skilled nursing level    Co-evaluation               AM-PAC PT "6 Clicks" Daily Activity  Outcome Measure Difficulty turning over in bed (including adjusting bedclothes, sheets and blankets)?: A Little Difficulty moving from lying on back to sitting on the side of the bed? : A Lot Difficulty sitting down on and standing up from a chair with arms (e.g., wheelchair, bedside commode, etc,.)?: A Lot Help needed moving to and from a bed to chair (including a wheelchair)?: A Lot Help needed walking in hospital room?: Total Help needed climbing 3-5 steps with a railing? : Total 6 Click Score: 11    End of Session Equipment Utilized During Treatment: Gait belt;Oxygen Activity Tolerance: Patient limited by fatigue(SaO2>94% on 1L; HR max 107) Patient left: in bed;with call bell/phone within reach(semi-chair position) Nurse Communication: Mobility status PT Visit Diagnosis: Muscle weakness (generalized) (M62.81)    Time: 3614-4315 PT Time Calculation (min) (ACUTE ONLY): 39 min   Charges:   PT Evaluation $PT Eval Moderate Complexity: 1 Mod PT Treatments $Therapeutic Activity: 8-22 mins   PT G Codes:          Barry Brunner, PT Pager [phone]      Rexanne Mano 04/04/2017, 9:54 AM

## 2017-04-04 NOTE — Clinical Social Work Note (Signed)
Clinical Social Work Assessment  Patient Details  Name: [name] MRN: [id number] Date of Birth: [birth year]  Date of referral:  04/04/17               Reason for consult:  Facility Placement                Permission sought to share information with:  Facility Sport and exercise psychologist, Family Supports Permission granted to share information::  Yes, Verbal Permission Granted  Name::     [name]  Agency::  yes  Relationship::  Spouse  Contact Information:  yes  Housing/Transportation Living arrangements for the past 2 months:  [place] of Information:  Patient Patient Interpreter Needed:  None Criminal Activity/Legal Involvement Pertinent to Current Situation/Hospitalization:  No - Comment as needed Significant Relationships:  Adult Children, Spouse Lives with:  Facility Resident Do you feel safe going back to the place where you live?  Yes Need for family participation in patient care:  No (Coment)  Care giving concerns: CSW spoke with pt at bedside.  Pt resides at [facility]. Pt does not have any care giving concerns.   Social Worker assessment / plan:  CSW spoke with pt at bedside. Pt is agreeable for placement at [facility].  Employment status:  Retired Nurse, adult PT Recommendations:  [place] / Referral to community resources: NA    Patient/Family's Response to care:  Patient reports agreement with discharge plan.  Patient/Family's Understanding of and Emotional Response to Diagnosis, Current Treatment, and Prognosis:  Pt is realistic regarding therapy needs.  Pt expressed understanding of CSW role and discharge process as well as medical condition.  No questions/concerns about plan or treatment.  Emotional Assessment Appearance:  Appears stated age Attitude/Demeanor/Rapport:  Engaged Affect (typically observed):  Accepting, Appropriate Orientation:  Oriented to Self, Oriented to  Place, Oriented to  Time, Oriented to Situation Alcohol / Substance use:  Not Applicable Psych involvement (Current and /or in the community):  No (Comment)  Discharge Needs  Concerns to be addressed:  (NA) Readmission within the last 30 days:  Yes Current discharge risk:  None Barriers to Discharge:  No Barriers Identified   Carolin Sicks, LCSWA 04/04/2017, 1:04 PM

## 2017-04-04 NOTE — Progress Notes (Signed)
Patient will Discharge To: [facility] Anticipated DC Date:04/04/17 Family Notified:Robert Nida [phone] ( not able to leave message.Voice mail not set up). Pt did not want anyone else contacted. Transport By: Corey Harold   Per MD patient ready for DC to [facility] . RN, patient, patient's family, and facility notified of DC. Assessment, Fl2/Pasrr, and Discharge Summary sent to facility. RN given number for report [phone]). DC packet on chart. Ambulance transport requested for patient.   CSW signing off.  Reed Breech LCSWA [phone]

## 2017-04-04 NOTE — Discharge Summary (Signed)
Physician Discharge Summary  [name] GMW:[id number] DOB: 05-20-1930 DOA: 04/01/2017  PCP: Reynold Bowen, MD  Admit date: 04/01/2017 Discharge date: 04/04/2017  Time spent: 25 minutes  Recommendations for Outpatient Follow-up:  1. Patient will need Eliquis twice a day 10 mg until 2/23 and then taper to 5 mg twice a day--has risk factors of relative immobility and prior breast cancer so may need this lifelong 2. Check CBC in about 1 week is on blood thinner in addition to aspirin 325 3. Resuming therapy with therapy services at [facility] when discharged 4. Will need evaluation of lower back in terms of spinal stenosis and neuropathy and pain suspect that this may be the reason why she has not been able to ambulate as well since her last admission recently 5. Will probably require oxygen on discharge 2 L 6. Please follow-up factor V Leiden was performed which is still pending 7. Suggest discussion with hematologist/oncologist as an outpatient regarding duration of anticoagulation 8. 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  She was recently discharged after being treated for COPD  exacerbation 2/4-03/24/17.  And prior to that earlier in the year 1/20 and 7-1/29/19 with similar respiratory complaints quiring BiPAP at that time Prior right-sided pneumonia 10/2016 Upon admission she was found to have bilateral pulmonary embolism with right heart strain, started on heparin drip--see below for discussion    Hospital Course:  Acute respiratory distress Bilateral pulmonary embolism with cor pulmonale - heparin drip transition to Eliquis 10 mg twice daily until 2/23 and then 5 mg twice daily -Await factor V Leiden testing performed as an outpatient can be followed -Lower extremity Dopplers revealed that patient has DVT in the left side --Echocardiogram done shows EF 55-60% with no mention of any asynchrony right heart strain at this time  COPD, stable - Continue home medications nebulizers-DuoNeb and Dulera, continue Tessalon 100 3 times daily, Dulera 1 puff twice daily, Singulair 10 at bedtime, recently finished prednisone burst  Essential hypertension -Continue metoprolol 12.5 mg   History of CAD, stable -Continue aspirin 325 daily, metoprolol 12.5 mg twice daily   History of depression -Continue Lexapro 10 mg, Wellbutrin 300 daily  Peripheral neuropathy, history of spinal stenosis and lumbar surgery, history of cervical stenosis not operative candidate -On gabapentin 300 mg 3 times daily, continue Robaxin 500 every 6 as needed-using IV Dilaudid 0.5 every 3 as needed for breakthrough -States that was not able to ambulate post discharge at [facility] facility from earlier this month and this may be the trigger as to why patient developed a DVT-- will need further therapy on follow-up  History of breast cancer status post lumpectomy sentinel node dissection 2005 -Outpatient oncology follow-up especially given venous thromboembolism  Pruritus upper extremities  from may be affecting lower extremities as well?--Lower extremities were examined this admission and  looked reasonable and were dressed with iodoform dressings and will need to continue to be dressed going forward by nursing daily at the facility    Procedures:   Echo Study Conclusions  - Left ventricle: abnormal septal motion The cavity size was normal. Systolic function was normal. The estimated ejection fraction was in the range of 55% to 60%. Wall motion was normal; there were no regional wall motion abnormalities. Doppler parameters are consistent with abnormal left ventricular relaxation (grade 1 diastolic dysfunction). - Aortic valve: Calcified non coronary cusp. There was trivial regurgitation. - Right ventricle: The cavity size was mildly dilated. Wall thickness was normal. - Atrial septum: No defect or patent foramen ovale was identified. - Pulmonary arteries: PA peak pressure: 39 mm Hg (S).   Discharge Exam: Vitals:   04/04/17 0608 04/04/17 0900  BP: 128/62   Pulse: 74   Resp: (!) 29   Temp: 98.7 F (37.1 C)   SpO2: 93% 94%    General: Awake alert pleasant no distress Cardiovascular: S1-S2 no murmur rub or gallop Respiratory: Clinically clear no added sounds Abdomen soft nontender no rebound no guarding Lower extremity wounds look like they are healing well     Discharge Instructions   Discharge Instructions    Diet - low sodium heart healthy   Complete by:  As directed    Increase activity slowly   Complete by:  As directed      Allergies as of 04/04/2017      Reactions   Oxycodone Hcl [oxycodone Hcl] Rash   Morphine Sulfate Other (See Comments)   REACTION: very anxious   Sulfamethoxazole Other (See Comments)   REACTION: unspecified      Medication List    STOP taking these medications   ADVIL 200 MG tablet Generic drug:  ibuprofen   docusate sodium 100 MG capsule Commonly known as:  COLACE   predniSONE 10 MG tablet Commonly known as:  DELTASONE     TAKE these medications   acetaminophen 325 MG tablet Commonly  known as:  TYLENOL Take 2 tablets (650 mg total) by mouth every 6 (six) hours as needed for mild pain (or Fever >/= 101).   apixaban 5 MG Tabs tablet Commonly known as:  ELIQUIS Take 2 tablets (10 mg total) by mouth 2 (two) times daily for 6 days.   apixaban 5 MG Tabs tablet Commonly known as:  ELIQUIS Take 1 tablet (5 mg total) by mouth 2 (two) times daily. Start taking on:  04/10/2017   aspirin EC 325 MG tablet Take 325 mg by mouth daily.   benzonatate 200 MG capsule Commonly known as:  TESSALON Take 200 mg by mouth 3 (three) times daily as needed for cough.   buPROPion 300 MG 24 hr tablet Commonly known as:  WELLBUTRIN XL Take 1 tablet (300 mg total) by mouth every morning.   CALCIUM+D3 600-800 MG-UNIT Tabs Generic drug:  Calcium Carb-Cholecalciferol Take 1 tablet by mouth daily.   CEPACOL SORE THROAT 15-2.6 MG Lozg Generic drug:  Benzocaine-Menthol Use as directed 1 lozenge in the mouth or throat every 4 (four) hours as needed (for sore throat).   clonazePAM 1 MG tablet Commonly known as:  KLONOPIN Take 1 tablet (1 mg total) by mouth at bedtime as needed for anxiety.   DULERA 200-5 MCG/ACT Aero Generic drug:  mometasone-formoterol Inhale 1 puff into the lungs 2 (two) times daily.  escitalopram 10 MG tablet Commonly known as:  LEXAPRO Take 1 tablet (10 mg total) by mouth daily.   fluticasone 50 MCG/ACT nasal spray Commonly known as:  FLONASE Place 1 spray into both nostrils 2 (two) times daily.   gabapentin 300 MG capsule Commonly known as:  NEURONTIN TAKE ONE CAPSULE THREE TIMES DAILY What changed:  See the new instructions.   guaiFENesin 600 MG 12 hr tablet Commonly known as:  MUCINEX Take 1 tablet (600 mg total) by mouth 2 (two) times daily.   HYDROcodone-acetaminophen 5-325 MG tablet Commonly known as:  NORCO/VICODIN Take 1 tablet by mouth 2 (two) times daily as needed for moderate pain.   ipratropium-albuterol 0.5-2.5 (3) MG/3ML Soln Commonly known  as:  DUONEB Take 3 mLs by nebulization every 6 (six) hours as needed. What changed:  reasons to take this   methocarbamol 500 MG tablet Commonly known as:  ROBAXIN Take 1 tablet (500 mg total) by mouth every 6 (six) hours as needed for muscle spasms.   metoprolol tartrate 25 MG tablet Commonly known as:  LOPRESSOR Take 0.5 tablets (12.5 mg total) by mouth 2 (two) times daily.   montelukast 10 MG tablet Commonly known as:  SINGULAIR Take 10 mg by mouth at bedtime.   MYRBETRIQ 50 MG Tb24 tablet Generic drug:  mirabegron ER Take 50 mg by mouth daily.   ondansetron 4 MG tablet Commonly known as:  ZOFRAN Take 4 mg by mouth every 8 (eight) hours as needed for nausea or vomiting.   polyethylene glycol packet Commonly known as:  MIRALAX / GLYCOLAX Take 17 g by mouth daily as needed for mild constipation.   PROAIR HFA 108 (90 Base) MCG/ACT inhaler Generic drug:  albuterol Inhale 2 puffs into the lungs every 4 (four) hours as needed for shortness of breath.   albuterol 0.63 MG/3ML nebulizer solution Commonly known as:  ACCUNEB Take 3 mLs (0.63 mg total) by nebulization 4 (four) times daily.   albuterol (2.5 MG/3ML) 0.083% nebulizer solution Commonly known as:  PROVENTIL Take 3 mLs (2.5 mg total) by nebulization every hour as needed for shortness of breath.   Propylene Glycol-Glycerin 0.6-0.6 % Soln Commonly known as:  SOOTHE Apply 1 drop to eye 2 (two) times daily.   senna 8.6 MG Tabs tablet Commonly known as:  SENOKOT Take 1 tablet by mouth daily as needed for mild constipation.   sodium phosphate 7-19 GM/118ML Enem Place 1 enema rectally daily as needed for severe constipation.      Allergies  Allergen Reactions  . Oxycodone Hcl [Oxycodone Hcl] Rash  . Morphine Sulfate Other (See Comments)    REACTION: very anxious  . Sulfamethoxazole Other (See Comments)    REACTION: unspecified      The results of significant diagnostics from this hospitalization (including  imaging, microbiology, ancillary and laboratory) are listed below for reference.    Significant Diagnostic Studies:  Microbiology: No results found for this or any previous visit (from the past 240 hour(s)).   Labs: Basic Metabolic Panel: Recent Labs  Lab 04/01/17 1607 04/02/17 0302  NA 136 139  K 4.5 3.7  CL 102 99*  CO2 25 27  GLUCOSE 134* 196*  BUN 12 10  CREATININE 0.66 0.63  CALCIUM 9.1 9.2   Liver Function Tests: No results for input(s): AST, ALT, ALKPHOS, BILITOT, PROT, ALBUMIN in the last 168 hours. No results for input(s): LIPASE, AMYLASE in the last 168 hours. No results for input(s): AMMONIA in the last 168 hours. CBC:  Recent Labs  Lab 04/01/17 1607 04/02/17 0302 04/03/17 0419 04/04/17 0439  WBC 12.3* 9.0 10.9* 9.1  HGB 15.2* 14.9 13.6 13.7  HCT 46.5* 44.6 41.7 42.2  MCV 97.1 96.1 96.3 97.5  PLT 127* 122* 151 139*   Cardiac Enzymes: No results for input(s): CKTOTAL, CKMB, CKMBINDEX, TROPONINI in the last 168 hours. BNP: BNP (last 3 results) Recent Labs    07/07/16 0844 11/08/16 0855 03/22/17 0829  BNP 103.1* 50.8 35.0    ProBNP (last 3 results) No results for input(s): PROBNP in the last 8760 hours.  CBG: No results for input(s): GLUCAP in the last 168 hours.     Signed:  Nita Sells MD   [facility] 04/04/2017, 1:04 PM

## 2017-04-07 LAB — FACTOR 5 LEIDEN

## 2017-06-11 ENCOUNTER — Inpatient Hospital Stay (HOSPITAL_COMMUNITY)
Admission: EM | Admit: 2017-06-11 | Discharge: 2017-06-15 | DRG: 871 | Disposition: A | Discharge status: Skilled Nursing Facility | Payer: Medicare Other | Attending: Family Medicine | Admitting: Family Medicine

## 2017-06-11 ENCOUNTER — Other Ambulatory Visit: Payer: Self-pay

## 2017-06-11 ENCOUNTER — Encounter (HOSPITAL_COMMUNITY): Payer: Self-pay | Admitting: General Practice

## 2017-06-11 ENCOUNTER — Emergency Department (HOSPITAL_COMMUNITY): Payer: Medicare Other

## 2017-06-11 DIAGNOSIS — F319 Bipolar disorder, unspecified: Secondary | ICD-10-CM | POA: Diagnosis present

## 2017-06-11 DIAGNOSIS — E119 Type 2 diabetes mellitus without complications: Secondary | ICD-10-CM | POA: Diagnosis present

## 2017-06-11 DIAGNOSIS — J441 Chronic obstructive pulmonary disease with (acute) exacerbation: Secondary | ICD-10-CM | POA: Diagnosis present

## 2017-06-11 DIAGNOSIS — Z87891 Personal history of nicotine dependence: Secondary | ICD-10-CM

## 2017-06-11 DIAGNOSIS — J9621 Acute and chronic respiratory failure with hypoxia: Secondary | ICD-10-CM | POA: Diagnosis present

## 2017-06-11 DIAGNOSIS — J690 Pneumonitis due to inhalation of food and vomit: Secondary | ICD-10-CM | POA: Diagnosis present

## 2017-06-11 DIAGNOSIS — Z853 Personal history of malignant neoplasm of breast: Secondary | ICD-10-CM | POA: Diagnosis not present

## 2017-06-11 DIAGNOSIS — Z882 Allergy status to sulfonamides status: Secondary | ICD-10-CM

## 2017-06-11 DIAGNOSIS — I2699 Other pulmonary embolism without acute cor pulmonale: Secondary | ICD-10-CM | POA: Diagnosis present

## 2017-06-11 DIAGNOSIS — I10 Essential (primary) hypertension: Secondary | ICD-10-CM | POA: Diagnosis present

## 2017-06-11 DIAGNOSIS — Z86711 Personal history of pulmonary embolism: Secondary | ICD-10-CM

## 2017-06-11 DIAGNOSIS — J9622 Acute and chronic respiratory failure with hypercapnia: Secondary | ICD-10-CM | POA: Diagnosis present

## 2017-06-11 DIAGNOSIS — C50111 Malignant neoplasm of central portion of right female breast: Secondary | ICD-10-CM

## 2017-06-11 DIAGNOSIS — A419 Sepsis, unspecified organism: Principal | ICD-10-CM | POA: Diagnosis present

## 2017-06-11 DIAGNOSIS — R1310 Dysphagia, unspecified: Secondary | ICD-10-CM | POA: Diagnosis present

## 2017-06-11 DIAGNOSIS — G4700 Insomnia, unspecified: Secondary | ICD-10-CM

## 2017-06-11 DIAGNOSIS — Z9181 History of falling: Secondary | ICD-10-CM

## 2017-06-11 DIAGNOSIS — G92 Toxic encephalopathy: Secondary | ICD-10-CM | POA: Diagnosis present

## 2017-06-11 DIAGNOSIS — F418 Other specified anxiety disorders: Secondary | ICD-10-CM | POA: Diagnosis present

## 2017-06-11 DIAGNOSIS — Z96641 Presence of right artificial hip joint: Secondary | ICD-10-CM | POA: Diagnosis present

## 2017-06-11 DIAGNOSIS — R509 Fever, unspecified: Secondary | ICD-10-CM

## 2017-06-11 DIAGNOSIS — Z7901 Long term (current) use of anticoagulants: Secondary | ICD-10-CM

## 2017-06-11 DIAGNOSIS — Z993 Dependence on wheelchair: Secondary | ICD-10-CM

## 2017-06-11 DIAGNOSIS — Z90710 Acquired absence of both cervix and uterus: Secondary | ICD-10-CM

## 2017-06-11 DIAGNOSIS — Z96653 Presence of artificial knee joint, bilateral: Secondary | ICD-10-CM | POA: Diagnosis present

## 2017-06-11 DIAGNOSIS — J9601 Acute respiratory failure with hypoxia: Secondary | ICD-10-CM | POA: Diagnosis present

## 2017-06-11 DIAGNOSIS — Z885 Allergy status to narcotic agent status: Secondary | ICD-10-CM

## 2017-06-11 DIAGNOSIS — Z87440 Personal history of urinary (tract) infections: Secondary | ICD-10-CM

## 2017-06-11 DIAGNOSIS — G9340 Encephalopathy, unspecified: Secondary | ICD-10-CM

## 2017-06-11 DIAGNOSIS — J209 Acute bronchitis, unspecified: Secondary | ICD-10-CM

## 2017-06-11 DIAGNOSIS — I2781 Cor pulmonale (chronic): Secondary | ICD-10-CM | POA: Diagnosis present

## 2017-06-11 DIAGNOSIS — J440 Chronic obstructive pulmonary disease with acute lower respiratory infection: Secondary | ICD-10-CM | POA: Diagnosis present

## 2017-06-11 DIAGNOSIS — M549 Dorsalgia, unspecified: Secondary | ICD-10-CM | POA: Diagnosis present

## 2017-06-11 LAB — COMPREHENSIVE METABOLIC PANEL
ALBUMIN: 3.1 g/dL — AB (ref 3.5–5.0)
ALK PHOS: 95 U/L (ref 38–126)
ALT: 30 U/L (ref 14–54)
AST: 36 U/L (ref 15–41)
Anion gap: 15 (ref 5–15)
BILIRUBIN TOTAL: 1.1 mg/dL (ref 0.3–1.2)
BUN: 8 mg/dL (ref 6–20)
CALCIUM: 9.3 mg/dL (ref 8.9–10.3)
CO2: 24 mmol/L (ref 22–32)
CREATININE: 0.49 mg/dL (ref 0.44–1.00)
Chloride: 98 mmol/L — ABNORMAL LOW (ref 101–111)
GFR calc Af Amer: >60 mL/min (ref >60)
GLUCOSE: 100 mg/dL — AB (ref 65–99)
Potassium: 4 mmol/L (ref 3.5–5.1)
Sodium: 137 mmol/L (ref 135–145)
TOTAL PROTEIN: 6.6 g/dL (ref 6.5–8.1)

## 2017-06-11 LAB — I-STAT ARTERIAL BLOOD GAS, ED
ACID-BASE DEFICIT: 2 mmol/L (ref 0.0–2.0)
ACID-BASE EXCESS: 2 mmol/L (ref 0.0–2.0)
BICARBONATE: 26 mmol/L (ref 20.0–28.0)
BICARBONATE: 28.4 mmol/L — AB (ref 20.0–28.0)
O2 SAT: 98 %
O2 SAT: 99 %
PO2 ART: 134 mmHg — AB (ref 83.0–108.0)
PO2 ART: 159 mmHg — AB (ref 83.0–108.0)
Patient temperature: 101
Patient temperature: 98.6
TCO2: 28 mmol/L (ref 22–32)
TCO2: 30 mmol/L (ref 22–32)
pCO2 arterial: 60.3 mmHg — ABNORMAL HIGH (ref 32.0–48.0)
pCO2 arterial: 50.3 mmHg — ABNORMAL HIGH (ref 32.0–48.0)
pH, Arterial: 7.25 — ABNORMAL LOW (ref 7.350–7.450)
pH, Arterial: 7.361 (ref 7.350–7.450)

## 2017-06-11 LAB — APTT: APTT: 117 s — AB (ref 24–36)

## 2017-06-11 LAB — I-STAT VENOUS BLOOD GAS, ED
ACID-BASE DEFICIT: 3 mmol/L — AB (ref 0.0–2.0)
BICARBONATE: 24.3 mmol/L (ref 20.0–28.0)
O2 SAT: 65 %
PO2 VEN: 41 mmHg (ref 32.0–45.0)
TCO2: 26 mmol/L (ref 22–32)
pCO2, Ven: 52.4 mmHg (ref 44.0–60.0)
pH, Ven: 7.281 (ref 7.250–7.430)

## 2017-06-11 LAB — CBC WITH DIFFERENTIAL/PLATELET
BASOS ABS: 0 10*3/uL (ref 0.0–0.1)
BASOS PCT: 0 %
EOS ABS: 0 10*3/uL (ref 0.0–0.7)
EOS PCT: 0 %
HCT: 42.1 % (ref 36.0–46.0)
Hemoglobin: 13.9 g/dL (ref 12.0–15.0)
LYMPHS PCT: 9 %
Lymphs Abs: 1.1 10*3/uL (ref 0.7–4.0)
MCH: 32.2 pg (ref 26.0–34.0)
MCHC: 33 g/dL (ref 30.0–36.0)
MCV: 97.5 fL (ref 78.0–100.0)
MONO ABS: 2 10*3/uL — AB (ref 0.1–1.0)
Monocytes Relative: 17 %
Neutro Abs: 8.6 10*3/uL — ABNORMAL HIGH (ref 1.7–7.7)
Neutrophils Relative %: 74 %
PLATELETS: 235 10*3/uL (ref 150–400)
RBC: 4.32 MIL/uL (ref 3.87–5.11)
RDW: 14.3 % (ref 11.5–15.5)
WBC: 11.7 10*3/uL — AB (ref 4.0–10.5)

## 2017-06-11 LAB — INFLUENZA PANEL BY PCR (TYPE A & B)
INFLBPCR: NEGATIVE
Influenza A By PCR: NEGATIVE

## 2017-06-11 LAB — TROPONIN I

## 2017-06-11 LAB — MRSA PCR SCREENING: MRSA by PCR: POSITIVE — AB

## 2017-06-11 LAB — I-STAT CG4 LACTIC ACID, ED: Lactic Acid, Venous: 1.44 mmol/L (ref 0.5–1.9)

## 2017-06-11 LAB — HEPARIN LEVEL (UNFRACTIONATED): Heparin Unfractionated: >2.2 IU/mL — ABNORMAL HIGH (ref 0.30–0.70)

## 2017-06-11 LAB — GLUCOSE, CAPILLARY: Glucose-Capillary: 248 mg/dL — ABNORMAL HIGH (ref 65–99)

## 2017-06-11 MED ORDER — BUDESONIDE 0.5 MG/2ML IN SUSP
0.5000 mg | Freq: Two times a day (BID) | RESPIRATORY_TRACT | Status: DC
Start: 2017-06-11 — End: 2017-06-15
  Administered 2017-06-11 – 2017-06-15 (×8): 0.5 mg via RESPIRATORY_TRACT
  Filled 2017-06-11 (×9): qty 2

## 2017-06-11 MED ORDER — SODIUM CHLORIDE 0.9 % IV BOLUS (SEPSIS)
1000.0000 mL | Freq: Once | INTRAVENOUS | Status: AC
  Administered 2017-06-11: 1000 mL via INTRAVENOUS

## 2017-06-11 MED ORDER — FENTANYL CITRATE (PF) 100 MCG/2ML IJ SOLN
12.5000 ug | INTRAMUSCULAR | Status: DC | PRN
  Administered 2017-06-11: 12.5 ug via INTRAVENOUS
  Filled 2017-06-11: qty 2

## 2017-06-11 MED ORDER — POLYVINYL ALCOHOL 1.4 % OP SOLN
1.0000 [drp] | Freq: Two times a day (BID) | OPHTHALMIC | Status: DC
  Administered 2017-06-11 – 2017-06-15 (×8): 1 [drp] via OPHTHALMIC
  Filled 2017-06-11 (×2): qty 15

## 2017-06-11 MED ORDER — METHYLPREDNISOLONE SODIUM SUCC 125 MG IJ SOLR
60.0000 mg | Freq: Three times a day (TID) | INTRAMUSCULAR | Status: DC
  Administered 2017-06-11 – 2017-06-14 (×9): 60 mg via INTRAVENOUS
  Filled 2017-06-11 (×9): qty 2

## 2017-06-11 MED ORDER — ALBUTEROL (5 MG/ML) CONTINUOUS INHALATION SOLN: 15.0000 mg/h | INHALATION_SOLUTION | RESPIRATORY_TRACT | Status: DC

## 2017-06-11 MED ORDER — ONDANSETRON HCL 4 MG PO TABS: 4.0000 mg | ORAL_TABLET | Freq: Four times a day (QID) | ORAL | Status: DC | PRN

## 2017-06-11 MED ORDER — SODIUM CHLORIDE 0.9 % IV SOLN
INTRAVENOUS | Status: DC
  Administered 2017-06-11 – 2017-06-12 (×2): via INTRAVENOUS

## 2017-06-11 MED ORDER — SODIUM CHLORIDE 0.9 % IV SOLN
2.0000 g | INTRAVENOUS | Status: DC
  Administered 2017-06-11 – 2017-06-13 (×3): 2 g via INTRAVENOUS
  Filled 2017-06-11 (×3): qty 20

## 2017-06-11 MED ORDER — ALBUTEROL (5 MG/ML) CONTINUOUS INHALATION SOLN
15.0000 mg/h | INHALATION_SOLUTION | RESPIRATORY_TRACT | Status: DC
  Administered 2017-06-11: 15 mg/h via RESPIRATORY_TRACT
  Filled 2017-06-11: qty 20

## 2017-06-11 MED ORDER — IPRATROPIUM-ALBUTEROL 0.5-2.5 (3) MG/3ML IN SOLN
3.0000 mL | Freq: Four times a day (QID) | RESPIRATORY_TRACT | Status: DC
  Administered 2017-06-11 – 2017-06-12 (×5): 3 mL via RESPIRATORY_TRACT
  Filled 2017-06-11 (×5): qty 3

## 2017-06-11 MED ORDER — SODIUM CHLORIDE 0.9 % IV SOLN
500.0000 mg | INTRAVENOUS | Status: DC
  Administered 2017-06-11 – 2017-06-13 (×3): 500 mg via INTRAVENOUS
  Filled 2017-06-11 (×4): qty 500

## 2017-06-11 MED ORDER — ACETAMINOPHEN 325 MG PO TABS: 650.0000 mg | ORAL_TABLET | Freq: Four times a day (QID) | ORAL | Status: DC | PRN

## 2017-06-11 MED ORDER — LORAZEPAM 2 MG/ML IJ SOLN
0.5000 mg | Freq: Once | INTRAMUSCULAR | Status: AC
  Administered 2017-06-11: 0.5 mg via INTRAVENOUS
  Filled 2017-06-11: qty 1

## 2017-06-11 MED ORDER — ALBUTEROL SULFATE (2.5 MG/3ML) 0.083% IN NEBU: 2.5000 mg | INHALATION_SOLUTION | RESPIRATORY_TRACT | Status: DC | PRN

## 2017-06-11 MED ORDER — ACETAMINOPHEN 650 MG RE SUPP: 650.0000 mg | Freq: Four times a day (QID) | RECTAL | Status: DC | PRN

## 2017-06-11 MED ORDER — IPRATROPIUM BROMIDE 0.02 % IN SOLN
0.5000 mg | Freq: Once | RESPIRATORY_TRACT | Status: AC
  Administered 2017-06-11: 0.5 mg via RESPIRATORY_TRACT
  Filled 2017-06-11: qty 2.5

## 2017-06-11 MED ORDER — PROPYLENE GLYCOL-GLYCERIN 0.6-0.6 % OP SOLN: 1.0000 [drp] | Freq: Two times a day (BID) | OPHTHALMIC | Status: DC

## 2017-06-11 MED ORDER — HEPARIN (PORCINE) IN NACL 100-0.45 UNIT/ML-% IJ SOLN
1150.0000 [IU]/h | INTRAMUSCULAR | Status: DC
  Administered 2017-06-11: 1300 [IU]/h via INTRAVENOUS
  Administered 2017-06-12: 1100 [IU]/h via INTRAVENOUS
  Administered 2017-06-13: 1150 [IU]/h via INTRAVENOUS
  Filled 2017-06-11 (×4): qty 250

## 2017-06-11 MED ORDER — FENTANYL CITRATE (PF) 100 MCG/2ML IJ SOLN
50.0000 ug | Freq: Once | INTRAMUSCULAR | Status: AC
  Administered 2017-06-11: 50 ug via INTRAVENOUS
  Filled 2017-06-11: qty 2

## 2017-06-11 MED ORDER — SODIUM CHLORIDE 0.9% FLUSH
3.0000 mL | Freq: Two times a day (BID) | INTRAVENOUS | Status: DC
  Administered 2017-06-11 – 2017-06-14 (×5): 3 mL via INTRAVENOUS

## 2017-06-11 MED ORDER — ACETAMINOPHEN 500 MG PO TABS
1000.0000 mg | ORAL_TABLET | Freq: Once | ORAL | Status: AC
  Administered 2017-06-11: 1000 mg via ORAL
  Filled 2017-06-11: qty 2

## 2017-06-11 MED ORDER — ONDANSETRON HCL 4 MG/2ML IJ SOLN
4.0000 mg | Freq: Four times a day (QID) | INTRAMUSCULAR | Status: DC | PRN
Start: 2017-06-11 — End: 2017-06-15
  Filled 2017-06-11: qty 2

## 2017-06-11 MED ORDER — FLUTICASONE PROPIONATE 50 MCG/ACT NA SUSP
1.0000 | Freq: Two times a day (BID) | NASAL | Status: DC
  Administered 2017-06-12 – 2017-06-15 (×7): 1 via NASAL
  Filled 2017-06-11: qty 16

## 2017-06-11 MED ORDER — MAGNESIUM SULFATE 2 GM/50ML IV SOLN
2.0000 g | Freq: Once | INTRAVENOUS | Status: AC
  Administered 2017-06-11: 2 g via INTRAVENOUS
  Filled 2017-06-11: qty 50

## 2017-06-11 NOTE — ED Triage Notes (Addendum)
To ED via [facility] from [facility], with c/o shortness of breath increasing over past 2 days-- productive cough for green sputum - pt also running temp - has been hospitalized x 3 since January with pneumonia  Pr received solumedrol 120mg  per EMS and albuterol 10/atrovent .5 nebulizer per EMS

## 2017-06-11 NOTE — Progress Notes (Signed)
ANTICOAGULATION CONSULT NOTE - Initial Consult  Pharmacy Consult for Heparin Indication: H/o PE  Allergies  Allergen Reactions  . Oxycodone Hcl [Oxycodone Hcl] Rash  . Morphine Sulfate Other (See Comments)    REACTION: very anxious  . Sulfamethoxazole Other (See Comments)    REACTION: unspecified    Patient Measurements: Height: 5\' 8"  (172.7 cm) Weight: 194 lb (88 kg) IBW/kg (Calculated) : 63.9 Heparin Dosing Weight: 81 kg   Vital Signs: Temp: 101 F (38.3 C) (04/26 0831) Temp Source: Oral (04/26 0831) BP: 93/64 (04/26 1230) Pulse Rate: 108 (04/26 1230)  Labs: Recent Labs    06/11/17 0856  HGB 13.9  HCT 42.1  PLT 235  CREATININE 0.49  TROPONINI <0.03    Estimated Creatinine Clearance: 59.7 mL/min (by C-G formula based on SCr of 0.49 mg/dL).   Medical History: Past Medical History:  Diagnosis Date  . Arthritis   . Asthma   . Breast cancer ([place]) 2006   right w/lumpectomy  . COPD (chronic obstructive pulmonary disease) ([place])   . Depression   . Diverticulosis of colon   . Endometrial hyperplasia 01/1996   fibroids, adenomyosis  . Fall 02/2017  . Frequent UTI   . History of colon polyps   . Hypertension   . Insomnia   . Osteopenia 12/08   hip  . Pre-diabetes   . Spinal stenosis 2001  . Vitamin D deficiency     Medications:   (Not in a hospital admission)  Assessment: 6 YOF from SNF on apixaban at home for h/o of bilateral PE's. Patient is here with worsening cough and difficulty breathing. Pharmacy consulted to switch to IV heparin due to poor PO intake. Last dose of apixaban was on 4/25. H/H and Plt wnl  Goal of Therapy:  Heparin level 0.3-0.7 units/ml aPTT 66-102s seconds Monitor platelets by anticoagulation protocol: Yes   Plan: -Start IV heparin at 1300 units/hr  -F/u 8 hr aPTT/HL -Monitor daily aPTT/HL, CBC and s/s of bleeding   Albertina Parr, PharmD., BCPS Clinical Pharmacist Clinical phone for 06/11/17 until 3:30pm: [phone] If  after 3:30pm, please call main pharmacy at: [phone]

## 2017-06-11 NOTE — ED Provider Notes (Signed)
[facility] DEPARTMENT Provider Note   CSN: [id number] Arrival date & time: 06/11/17  0820     History   Chief Complaint Chief Complaint  Patient presents with  . Respiratory Distress    HPI [name] is a 84 y.o. female.  Pt presents to the ED today with SOB.  The pt said sx have been ongoing for 4 days.  The pt said she's been unable to sleep.  She was admitted from 2/14-2/17 for COPD exacerbation and bilateral PE with cor pulmonale.  She was put on Eliquis while hospitalized.  The pt said she's been coughing up sputum and may have had a fever.  EMS gave pt 10 mg albuterol, 0.5 mg atrovent, 125 mg solumedrol.  She is still very sob.        Past Medical History:  Diagnosis Date  . Arthritis   . Asthma   . Breast cancer ([place]) 2006   right w/lumpectomy  . COPD (chronic obstructive pulmonary disease) ([place])   . Depression   . Diverticulosis of colon   . Endometrial hyperplasia 01/1996   fibroids, adenomyosis  . Fall 02/2017  . Frequent UTI   . History of colon polyps   . Hypertension   . Insomnia   . Osteopenia 12/08   hip  . Pre-diabetes   . Spinal stenosis 2001  . Vitamin D deficiency     Patient Active Problem List   Diagnosis Date Noted  . Acute respiratory failure with hypoxia ([place]) 04/01/2017  . Laceration of forehead   . Acute on chronic respiratory failure with hypoxia ([place]) 03/15/2017  . Fall 03/15/2017  . Depression with anxiety 03/15/2017  . COPD exacerbation ([place]) 03/15/2017  . COPD with acute exacerbation ([place])   . Acute lower UTI   . Sepsis ([place]) 11/08/2016  . Morbid obesity due to excess calories ([place]) 09/02/2016  . Esophageal dysfunction 09/01/2016  . PNA (pneumonia) 08/09/2016  . Wheelchair bound 08/09/2016  . Wound healing, delayed 08/09/2016  . HCAP (healthcare-associated pneumonia) 07/07/2016  . Chronic ulcer of right leg ([place]) 07/07/2016  . SIRS (systemic inflammatory response syndrome) ([place])  05/08/2016  . Community acquired pneumonia 05/08/2016  . Failed total hip arthroplasty ([place]) 07/10/2015  . Breast cancer, right breast ([place]) 07/18/2014  . CONCUSSION WITH LOC OF UNSPECIFIED DURATION 07/19/2009  . OTH SYMPTOMS INVOLVING RESPIRATORY SYSTEM&CHEST 11/12/2008  . UNSPECIFIED DISORDER OF ANKLE AND FOOT JOINT 05/11/2008  . CERUMEN IMPACTION, BILATERAL 05/04/2008  . Dysmetabolic syndrome X 08/12/9483  . UNS ADVRS EFF UNS RX MEDICINAL&BIOLOGICAL SBSTNC 08/12/2007  . Other and unspecified hyperlipidemia 04/01/2007  . INSOMNIA, PERSISTENT 09/09/2006  . Essential hypertension 09/09/2006  . DIVERTICULOSIS, COLON 09/09/2006  . Osteoarthritis 09/09/2006  . Spinal stenosis 09/09/2006  . COLONIC POLYPS, HX OF 09/09/2006    Past Surgical History:  Procedure Laterality Date  . BILATERAL SALPINGOOPHORECTOMY  12/97  . BREAST LUMPECTOMY Right 03/2003  . CARPAL TUNNEL RELEASE    . CERVICAL FUSION    . CERVICAL LAMINECTOMY  1975   x4  . CHOLECYSTECTOMY  11/00   lap chole  . HYSTEROSCOPY  10/97   D&C, postmenopausal bleeding  . LUMBAR LAMINECTOMY  10/06   with spinal stenosis  . manipulation of hip for dislocation    . ROTATOR CUFF REPAIR Right 11/96  . TONSILLECTOMY AND ADENOIDECTOMY    . TOTAL ABDOMINAL HYSTERECTOMY  12/97   endo hyperplasia, fibroids, adenomyosis  . TOTAL HIP ARTHROPLASTY Right 8/00  . TOTAL HIP  REVISION Right 07/10/2015   Procedure: RIGHT ACETABULAR VS TOTAL HIP REVISION;  Surgeon: Gaynelle Arabian, MD;  Location: [facility] ORS;  Service: Orthopedics;  Laterality: Right;  . TOTAL KNEE ARTHROPLASTY Left 06/2003  . TOTAL KNEE ARTHROPLASTY Right 10/06  . TUBAL LIGATION Bilateral 1968     OB History    Gravida  3   Para  3   Term  3   Preterm      AB      Living        SAB      TAB      Ectopic      Multiple      Live Births               Home Medications    Prior to Admission medications   Medication Sig Start Date End Date Taking?  Authorizing Provider  acetaminophen (TYLENOL) 325 MG tablet Take 2 tablets (650 mg total) by mouth every 6 (six) hours as needed for mild pain (or Fever >/= 101). 07/12/15   Perkins, Alexzandrew L, PA-C  albuterol (ACCUNEB) 0.63 MG/3ML nebulizer solution Take 3 mLs (0.63 mg total) by nebulization 4 (four) times daily. 03/24/17   Debbe Odea, MD  albuterol (PROAIR HFA) 108 (90 BASE) MCG/ACT inhaler Inhale 2 puffs into the lungs every 4 (four) hours as needed for shortness of breath.     [provider]  albuterol (PROVENTIL) (2.5 MG/3ML) 0.083% nebulizer solution Take 3 mLs (2.5 mg total) by nebulization every hour as needed for shortness of breath. 03/24/17   Debbe Odea, MD  apixaban (ELIQUIS) 5 MG TABS tablet Take 2 tablets (10 mg total) by mouth 2 (two) times daily for 6 days. 04/04/17 04/10/17  Nita Sells, MD  apixaban (ELIQUIS) 5 MG TABS tablet Take 1 tablet (5 mg total) by mouth 2 (two) times daily. 04/10/17   Nita Sells, MD  aspirin EC 325 MG tablet Take 325 mg by mouth daily.     [provider]  Benzocaine-Menthol (CEPACOL SORE THROAT) 15-2.6 MG LOZG Use as directed 1 lozenge in the mouth or throat every 4 (four) hours as needed (for sore throat).    [provider]  benzonatate (TESSALON) 200 MG capsule Take 200 mg by mouth 3 (three) times daily as needed for cough.    [provider]  buPROPion (WELLBUTRIN XL) 300 MG 24 hr tablet Take 1 tablet (300 mg total) by mouth every morning. 04/04/17   Nita Sells, MD  Calcium Carb-Cholecalciferol (CALCIUM+D3) 600-800 MG-UNIT TABS Take 1 tablet by mouth daily.    [provider]  clonazePAM (KLONOPIN) 1 MG tablet Take 1 tablet (1 mg total) by mouth at bedtime as needed for anxiety. 04/04/17   Nita Sells, MD  escitalopram (LEXAPRO) 10 MG tablet Take 1 tablet (10 mg total) by mouth daily. 04/04/17   Nita Sells, MD  fluticasone (FLONASE) 50 MCG/ACT nasal spray Place 1  spray into both nostrils 2 (two) times daily.    [provider]  gabapentin (NEURONTIN) 300 MG capsule TAKE ONE CAPSULE THREE TIMES DAILY Patient taking differently: TAKE 300 mg CAPSULE THREE TIMES DAILY 12/14/16   Magrinat, Virgie Dad, MD  guaiFENesin (MUCINEX) 600 MG 12 hr tablet Take 1 tablet (600 mg total) by mouth 2 (two) times daily. 03/24/17 03/24/18  Debbe Odea, MD  HYDROcodone-acetaminophen (NORCO/VICODIN) 5-325 MG tablet Take 1 tablet by mouth 2 (two) times daily as needed for moderate pain. 04/04/17   Nita Sells, MD  ipratropium-albuterol (DUONEB) 0.5-2.5 (3) MG/3ML SOLN Take 3 mLs by nebulization every 6 (six) hours as needed. Patient taking differently: Take 3 mLs by nebulization every 6 (six) hours as needed (wheezing).  03/16/17   Debbe Odea, MD  methocarbamol (ROBAXIN) 500 MG tablet Take 1 tablet (500 mg total) by mouth every 6 (six) hours as needed for muscle spasms. 07/12/15   Perkins, Alexzandrew L, PA-C  metoprolol tartrate (LOPRESSOR) 25 MG tablet Take 0.5 tablets (12.5 mg total) by mouth 2 (two) times daily. 08/12/16   Florencia Reasons, MD  mirabegron ER (MYRBETRIQ) 50 MG TB24 tablet Take 50 mg by mouth daily.    [provider]  mometasone-formoterol (DULERA) 200-5 MCG/ACT AERO Inhale 1 puff into the lungs 2 (two) times daily.    [provider]  montelukast (SINGULAIR) 10 MG tablet Take 10 mg by mouth at bedtime.      [provider]  ondansetron (ZOFRAN) 4 MG tablet Take 4 mg by mouth every 8 (eight) hours as needed for nausea or vomiting.    [provider]  polyethylene glycol (MIRALAX / GLYCOLAX) packet Take 17 g by mouth daily as needed for mild constipation. 07/12/15   Perkins, Alexzandrew L, PA-C  Propylene Glycol-Glycerin (SOOTHE) 0.6-0.6 % SOLN Apply 1 drop to eye 2 (two) times daily. 11/28/15   Domenic Moras, PA-C  senna (SENOKOT) 8.6 MG TABS tablet Take 1 tablet by mouth daily as needed for mild constipation.    [provider]  sodium phosphate (FLEET) 7-19 GM/118ML ENEM Place 1 enema rectally daily as needed for severe constipation.    [provider]    Family History Family History  Problem Relation Age of Onset  . Diabetes Father   . Colon cancer Father 84  . Heart failure Father   . Arthritis Mother   . Thyroid disease Mother   . Osteoporosis Mother   . Diabetes Brother   . Diabetes Brother   . Osteoporosis Maternal Aunt   . Osteoporosis Maternal Grandmother     Social History Social History   Tobacco Use  . Smoking status: Former Smoker    Packs/day: 0.25    Years: 10.00    Pack years: 2.50    Types: Cigarettes    Last attempt to quit: 04/24/1990    Years since quitting: 27.1  . Smokeless tobacco: Never Used  Substance Use Topics  . Alcohol use: Yes    Alcohol/week: 2.4 oz    Types: 4 Glasses of wine per week    Comment: occ  . Drug use: No     Allergies   Oxycodone hcl [oxycodone hcl]; Morphine sulfate; and Sulfamethoxazole   Review of Systems Review of Systems  Constitutional: Positive for fever.  Respiratory: Positive for cough, shortness of breath and wheezing.   All other systems reviewed and are negative.    Physical Exam Updated Vital Signs BP 125/75   Pulse (!) 120   Temp (S) (!) 101 F (38.3 C) (Oral)   Resp (!) 29   Ht 5\' 8"  (1.727 m)   Wt 88 kg (194 lb)   SpO2 97%   BMI 29.50 kg/m   Physical Exam  Constitutional: She is oriented to person, place, and time. She appears distressed.  HENT:  Head: Normocephalic and atraumatic.  Right Ear: External ear normal.  Left Ear: External ear normal.  Nose: Nose normal.  Mouth/Throat: Mucous membranes are dry.  Eyes: Pupils are equal, round, and reactive to light. Conjunctivae and EOM  are normal.  Neck: Normal range of motion. Neck supple.  Cardiovascular: Regular rhythm. Tachycardia present.  Pulmonary/Chest: She is in respiratory distress. She has wheezes.  Abdominal: Soft. Bowel sounds  are normal.  Musculoskeletal: Normal range of motion.  Neurological: She is alert and oriented to person, place, and time.  Skin: Skin is warm. Capillary refill takes less than 2 seconds.  Psychiatric: Her mood appears anxious.     ED Treatments / Results  Labs (all labs ordered are listed, but only abnormal results are displayed) Labs Reviewed  COMPREHENSIVE METABOLIC PANEL - Abnormal; Notable for the following components:      Result Value   Chloride 98 (*)    Glucose, Bld 100 (*)    Albumin 3.1 (*)    All other components within normal limits  CBC WITH DIFFERENTIAL/PLATELET - Abnormal; Notable for the following components:   WBC 11.7 (*)    Neutro Abs 8.6 (*)    Monocytes Absolute 2.0 (*)    All other components within normal limits  CULTURE, BLOOD (ROUTINE X 2)  CULTURE, BLOOD (ROUTINE X 2)  URINE CULTURE  TROPONIN I  URINALYSIS, ROUTINE W REFLEX MICROSCOPIC  INFLUENZA PANEL BY PCR (TYPE A & B)  I-STAT CG4 LACTIC ACID, ED  I-STAT CG4 LACTIC ACID, ED    EKG EKG Interpretation  Date/Time:  Friday June 11 2017 08:28:41 EDT Ventricular Rate:  119 PR Interval:    QRS Duration: 94 QT Interval:  319 QTC Calculation: 449 R Axis:   18 Text Interpretation:  Sinus tachycardia RSR' in V1 or V2, right VCD or RVH Borderline T abnormalities, anterior leads No significant change since last tracing Confirmed by Isla Pence [phone]) on 06/11/2017 8:47:02 AM   Radiology Dg Chest Port 1 View  Result Date: 06/11/2017 CLINICAL DATA:  Shortness of breath. EXAM: PORTABLE CHEST 1 VIEW COMPARISON:  CT chest and chest x-ray dated April 01, 2017. FINDINGS: The heart size and mediastinal contours are within normal limits. Normal pulmonary vascularity. Slightly coarsened interstitial markings are similar to prior study. Mild bibasilar atelectasis. No focal consolidation, pleural effusion, or pneumothorax. No acute osseous abnormality. IMPRESSION: No active disease. Electronically Signed    By: Titus Dubin M.D.   On: 06/11/2017 09:10    Procedures Procedures (including critical care time)  Medications Ordered in ED Medications  albuterol (PROVENTIL,VENTOLIN) solution continuous neb (0 mg/hr Nebulization Stopped 06/11/17 1040)  cefTRIAXone (ROCEPHIN) 2 g in sodium chloride 0.9 % 100 mL IVPB (0 g Intravenous Stopped 06/11/17 0958)  azithromycin (ZITHROMAX) 500 mg in sodium chloride 0.9 % 250 mL IVPB (500 mg Intravenous New Bag/Given 06/11/17 1007)  albuterol (PROVENTIL,VENTOLIN) solution continuous neb (0 mg/hr Nebulization Hold 06/11/17 0838)  ipratropium (ATROVENT) nebulizer solution 0.5 mg (0.5 mg Nebulization Given 06/11/17 0837)  sodium chloride 0.9 % bolus 1,000 mL (1,000 mLs Intravenous New Bag/Given 06/11/17 0928)    And  sodium chloride 0.9 % bolus 1,000 mL (1,000 mLs Intravenous New Bag/Given 06/11/17 0935)    And  sodium chloride 0.9 % bolus 1,000 mL (1,000 mLs Intravenous New Bag/Given 06/11/17 1000)  acetaminophen (TYLENOL) tablet 1,000 mg (1,000 mg Oral Given 06/11/17 0957)  magnesium sulfate IVPB 2 g 50 mL (2 g Intravenous New Bag/Given 06/11/17 0949)  fentaNYL (SUBLIMAZE) injection 50 mcg (50 mcg Intravenous Given 06/11/17 0942)  LORazepam (ATIVAN) injection 0.5 mg (0.5 mg Intravenous Given 06/11/17 0938)     Initial Impression / Assessment and Plan / ED Course  I have reviewed the triage vital  signs and the nursing notes.  Pertinent labs & imaging results that were available during my care of the patient were reviewed by me and considered in my medical decision making (see chart for details).  Pt given an additional 15 mg continuous albuterol neb and has improved some, but is still sob and wheezing.  The pt placed on bipap.  Pt treated for sepsis with sepsis protocol.  IVFs and IV abx given.  CRITICAL CARE Performed by: Isla Pence   Total critical care time: 30 minutes  Critical care time was exclusive of separately billable procedures and treating  other patients.  Critical care was necessary to treat or prevent imminent or life-threatening deterioration.  Critical care was time spent personally by me on the following activities: development of treatment plan with patient and/or surrogate as well as nursing, discussions with consultants, evaluation of patient's response to treatment, examination of patient, obtaining history from patient or surrogate, ordering and performing treatments and interventions, ordering and review of laboratory studies, ordering and review of radiographic studies, pulse oximetry and re-evaluation of patient's condition.    Pt d/w Dr. Algis Liming ([facility]) for admission.  Final Clinical Impressions(s) / ED Diagnoses   Final diagnoses:  COPD exacerbation ([place])  Acute bronchitis, unspecified organism  Fever, unspecified fever cause  Sepsis, due to unspecified organism [facility])  Acute on chronic respiratory failure with hypoxia [facility])    ED Discharge Orders    None       Isla Pence, MD 06/11/17 1113

## 2017-06-11 NOTE — ED Notes (Signed)
Admitting notified of 2 consecutive BP readings systolic in the 49'T. Manual performed 98/60. Will continue to monitor. Respiratory at bedside drawing ABG.

## 2017-06-11 NOTE — Progress Notes (Signed)
Addendum  ABG results reviewed: pH 7.25, PCO2 60.3, PO2 134, bicarbonate 26, oxygen saturation 98%.  Based on these results, patient likely has acute on chronic hypoxic and hypercapnic respiratory failure.  Baseline CO2 may be in the low 50s.  Continue BiPAP.  Repeat ABG in 2 hours.  As per RN, patient woke up and complaining of back pain.  Tylenol not helping.  Listed allergy to oxycodone and morphine.  PRN IV fentanyl at low-dose.  Discussed in detail with patient's RN.  Vernell Leep, MD, FACP, [facility]. [facility] Pager [phone]  If 7PM-7AM, please contact night-coverage [email or web address] Password [email or web address] 06/11/2017, 1:18 PM

## 2017-06-11 NOTE — Progress Notes (Signed)
Patient placed on bipap per MD order.  Patient currently tolerating well.  Will continue to monitor.  

## 2017-06-11 NOTE — H&P (Addendum)
History and Physical    [name] PPH:[id number] DOB: 1930-10-02 DOA: 06/11/2017  PCP: Reynold Bowen, MD   I have briefly reviewed patients previous medical reports in [facility].  Patient coming from: [facility]  Chief Complaint: Productive cough, fever and dyspnea.  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, recently widowed 3 weeks ago, currently residing at [facility] for the last month, reportedly not on chronic oxygen, not mobile and moves around with the help of a wheelchair, PMH of asthma/COPD, anxiety, depression, recurrent UTI, HTN, diet-controlled DM 2, breast cancer status post lumpectomy, recent multiple hospitalizations (3 in the last 3 months), last one 04/01/2017-04/04/2017 for acute respiratory failure due to bilateral pulmonary embolism with cor pulmonale at which time she was discharged on Eliquis, presented to [facility] ED via EMS on 06/11/2017 with above complaints.  Patient currently is sedated and unresponsive from medications that she received in the ED and is unable to provide any history.  History was obtained by discussing with the DP and patient son [name] who is also the healthcare power of attorney at bedside.  He states that since patient's husband's death approximately 3 weeks ago, patient has been doing poorly, not moving around much.  He gives 4 to 5 days history of cough productive of brown sputum and difficulty breathing.  He last saw her 3 days prior to admission.  Today he was called by SNF who advised him that patient was being transported to the ED due to worsening symptoms.  As per EDP, EMS gave patient 10 mg of albuterol, 0.5 mg of Atrovent, 125 mg Solu-Medrol for worsening dyspnea.  ED Course: Patient received prolonged nebulization, treated per sepsis protocol with aggressive IV fluids and started empirically on IV ceftriaxone and azithromycin.  Dyspnea persisted and she was placed on BiPAP.  She received Ativan and fentanyl for some  agitation.  Chest x-ray negative.  Review of Systems:  All other systems reviewed and apart from HPI, are negative.  Past Medical History:  Diagnosis Date  . Arthritis   . Asthma   . Breast cancer ([place]) 2006   right w/lumpectomy  . COPD (chronic obstructive pulmonary disease) ([place])   . Depression   . Diverticulosis of colon   . Endometrial hyperplasia 01/1996   fibroids, adenomyosis  . Fall 02/2017  . Frequent UTI   . History of colon polyps   . Hypertension   . Insomnia   . Osteopenia 12/08   hip  . Pre-diabetes   . Spinal stenosis 2001  . Vitamin D deficiency     Past Surgical History:  Procedure Laterality Date  . BILATERAL SALPINGOOPHORECTOMY  12/97  . BREAST LUMPECTOMY Right 03/2003  . CARPAL TUNNEL RELEASE    . CERVICAL FUSION    . CERVICAL LAMINECTOMY  1975   x4  . CHOLECYSTECTOMY  11/00   lap chole  . HYSTEROSCOPY  10/97   D&C, postmenopausal bleeding  . LUMBAR LAMINECTOMY  10/06   with spinal stenosis  . manipulation of hip for dislocation    . ROTATOR CUFF REPAIR Right 11/96  . TONSILLECTOMY AND ADENOIDECTOMY    . TOTAL ABDOMINAL HYSTERECTOMY  12/97   endo hyperplasia, fibroids, adenomyosis  . TOTAL HIP ARTHROPLASTY Right 8/00  . TOTAL HIP REVISION Right 07/10/2015   Procedure: RIGHT ACETABULAR VS TOTAL HIP REVISION;  Surgeon: Gaynelle Arabian, MD;  Location: [facility] ORS;  Service: Orthopedics;  Laterality: Right;  . TOTAL KNEE ARTHROPLASTY Left 06/2003  . TOTAL  KNEE ARTHROPLASTY Right 10/06  . TUBAL LIGATION Bilateral 1968    Social History  reports that she quit smoking about 27 years ago. Her smoking use included cigarettes. She has a 2.50 pack-year smoking history. She has never used smokeless tobacco. She reports that she drinks about 2.4 oz of alcohol per week. She reports that she does not use drugs.  Allergies  Allergen Reactions  . Oxycodone Hcl [Oxycodone Hcl] Rash  . Morphine Sulfate Other (See Comments)    REACTION: very anxious  .  Sulfamethoxazole Other (See Comments)    REACTION: unspecified    Family History  Problem Relation Age of Onset  . Diabetes Father   . Colon cancer Father 44  . Heart failure Father   . Arthritis Mother   . Thyroid disease Mother   . Osteoporosis Mother   . Diabetes Brother   . Diabetes Brother   . Osteoporosis Maternal Aunt   . Osteoporosis Maternal Grandmother      Prior to Admission medications   Medication Sig Start Date End Date Taking? Authorizing Provider  acetaminophen (TYLENOL) 325 MG tablet Take 2 tablets (650 mg total) by mouth every 6 (six) hours as needed for mild pain (or Fever >/= 101). 07/12/15   Perkins, Alexzandrew L, PA-C  albuterol (ACCUNEB) 0.63 MG/3ML nebulizer solution Take 3 mLs (0.63 mg total) by nebulization 4 (four) times daily. 03/24/17   Debbe Odea, MD  albuterol (PROAIR HFA) 108 (90 BASE) MCG/ACT inhaler Inhale 2 puffs into the lungs every 4 (four) hours as needed for shortness of breath.     [provider]  albuterol (PROVENTIL) (2.5 MG/3ML) 0.083% nebulizer solution Take 3 mLs (2.5 mg total) by nebulization every hour as needed for shortness of breath. 03/24/17   Debbe Odea, MD  apixaban (ELIQUIS) 5 MG TABS tablet Take 2 tablets (10 mg total) by mouth 2 (two) times daily for 6 days. 04/04/17 04/10/17  Nita Sells, MD  apixaban (ELIQUIS) 5 MG TABS tablet Take 1 tablet (5 mg total) by mouth 2 (two) times daily. 04/10/17   Nita Sells, MD  aspirin EC 325 MG tablet Take 325 mg by mouth daily.     [provider]  Benzocaine-Menthol (CEPACOL SORE THROAT) 15-2.6 MG LOZG Use as directed 1 lozenge in the mouth or throat every 4 (four) hours as needed (for sore throat).    [provider]  benzonatate (TESSALON) 200 MG capsule Take 200 mg by mouth 3 (three) times daily as needed for cough.    [provider]  buPROPion (WELLBUTRIN XL) 300 MG 24 hr tablet Take 1 tablet (300 mg total) by mouth every morning.  04/04/17   Nita Sells, MD  Calcium Carb-Cholecalciferol (CALCIUM+D3) 600-800 MG-UNIT TABS Take 1 tablet by mouth daily.    [provider]  clonazePAM (KLONOPIN) 1 MG tablet Take 1 tablet (1 mg total) by mouth at bedtime as needed for anxiety. 04/04/17   Nita Sells, MD  escitalopram (LEXAPRO) 10 MG tablet Take 1 tablet (10 mg total) by mouth daily. 04/04/17   Nita Sells, MD  fluticasone (FLONASE) 50 MCG/ACT nasal spray Place 1 spray into both nostrils 2 (two) times daily.    [provider]  gabapentin (NEURONTIN) 300 MG capsule TAKE ONE CAPSULE THREE TIMES DAILY Patient taking differently: TAKE 300 mg CAPSULE THREE TIMES DAILY 12/14/16   Magrinat, Virgie Dad, MD  guaiFENesin (MUCINEX) 600 MG 12 hr tablet Take 1 tablet (600 mg total) by mouth 2 (two) times daily.  03/24/17 03/24/18  Debbe Odea, MD  HYDROcodone-acetaminophen (NORCO/VICODIN) 5-325 MG tablet Take 1 tablet by mouth 2 (two) times daily as needed for moderate pain. 04/04/17   Nita Sells, MD  ipratropium-albuterol (DUONEB) 0.5-2.5 (3) MG/3ML SOLN Take 3 mLs by nebulization every 6 (six) hours as needed. Patient taking differently: Take 3 mLs by nebulization every 6 (six) hours as needed (wheezing).  03/16/17   Debbe Odea, MD  methocarbamol (ROBAXIN) 500 MG tablet Take 1 tablet (500 mg total) by mouth every 6 (six) hours as needed for muscle spasms. 07/12/15   Perkins, Alexzandrew L, PA-C  metoprolol tartrate (LOPRESSOR) 25 MG tablet Take 0.5 tablets (12.5 mg total) by mouth 2 (two) times daily. 08/12/16   Florencia Reasons, MD  mirabegron ER (MYRBETRIQ) 50 MG TB24 tablet Take 50 mg by mouth daily.    [provider]  mometasone-formoterol (DULERA) 200-5 MCG/ACT AERO Inhale 1 puff into the lungs 2 (two) times daily.    [provider]  montelukast (SINGULAIR) 10 MG tablet Take 10 mg by mouth at bedtime.      [provider]  ondansetron (ZOFRAN) 4 MG tablet Take 4 mg by  mouth every 8 (eight) hours as needed for nausea or vomiting.    [provider]  polyethylene glycol (MIRALAX / GLYCOLAX) packet Take 17 g by mouth daily as needed for mild constipation. 07/12/15   Perkins, Alexzandrew L, PA-C  Propylene Glycol-Glycerin (SOOTHE) 0.6-0.6 % SOLN Apply 1 drop to eye 2 (two) times daily. 11/28/15   Domenic Moras, PA-C  senna (SENOKOT) 8.6 MG TABS tablet Take 1 tablet by mouth daily as needed for mild constipation.    [provider]  sodium phosphate (FLEET) 7-19 GM/118ML ENEM Place 1 enema rectally daily as needed for severe constipation.    [provider]    Physical Exam: Vitals:   06/11/17 0930 06/11/17 1000 06/11/17 1042 06/11/17 1045  BP: (!) 142/92 (!) 164/87 125/75   Pulse: (!) 121 (!) 123 (!) 119 (!) 120  Resp: (!) 23 (!) 35 (!) 29 (!) 29  Temp:      TempSrc:      SpO2: 93% 90% 97%   Weight:      Height:          Constitutional: Pleasant elderly female, moderately built and nourished lying comfortably propped up in bed with mild respiratory distress and on BiPAP currently. Eyes: PERTLA, lids and conjunctivae normal ENMT: Unable to examine currently due to patient's lack of cooperation from being unresponsive and BiPAP. Neck: supple, no masses, no thyromegaly Respiratory: Decreased breath sounds bilaterally with scattered bilateral few medium pitched expiratory rhonchi but no crackles.  No stridor.  Mild increased work of breathing.  Cardiovascular: S1 & S2 heard, regular rate and rhythm, no murmurs / rubs / gallops.  Trace bilateral ankle edema. 2+ pedal pulses. No carotid bruits.  Abdomen: No distension, no tenderness, no masses palpated. No hepatosplenomegaly. Bowel sounds normal.  Musculoskeletal: no clubbing / cyanosis. No joint deformity upper and lower extremities. Good ROM, no contractures. Normal muscle tone.  Surgical scars over both knees. Skin: Multiple superficial bruises/abrasions over both legs. Neurologic:  CN 2-12 grossly intact. Sensation intact, DTR normal.  Patient spontaneously moves all extremities but power cannot be truly assessed due to lack of cooperation. Psychiatric: Unable to evaluate judgment and insight.  Sedated.  Barely opens eyes to touch, spontaneously moves all extremities but no verbal response and does not follow instructions.    Labs on  Admission: I have personally reviewed following labs and imaging studies  CBC: Recent Labs  Lab 06/11/17 0856  WBC 11.7*  NEUTROABS 8.6*  HGB 13.9  HCT 42.1  MCV 97.5  PLT 938   Basic Metabolic Panel: Recent Labs  Lab 06/11/17 0856  NA 137  K 4.0  CL 98*  CO2 24  GLUCOSE 100*  BUN 8  CREATININE 0.49  CALCIUM 9.3   Liver Function Tests: Recent Labs  Lab 06/11/17 0856  AST 36  ALT 30  ALKPHOS 95  BILITOT 1.1  PROT 6.6  ALBUMIN 3.1*   Cardiac Enzymes: Recent Labs  Lab 06/11/17 0856  TROPONINI <0.03      Radiological Exams on Admission: Dg Chest Port 1 View  Result Date: 06/11/2017 CLINICAL DATA:  Shortness of breath. EXAM: PORTABLE CHEST 1 VIEW COMPARISON:  CT chest and chest x-ray dated April 01, 2017. FINDINGS: The heart size and mediastinal contours are within normal limits. Normal pulmonary vascularity. Slightly coarsened interstitial markings are similar to prior study. Mild bibasilar atelectasis. No focal consolidation, pleural effusion, or pneumothorax. No acute osseous abnormality. IMPRESSION: No active disease. Electronically Signed   By: Titus Dubin M.D.   On: 06/11/2017 09:10    EKG: Independently reviewed.  Sinus tachycardia at 119 bpm, normal axis, RBBB pattern (?  Incomplete) in leads V1-2 (not new), no acute findings and QTc 449 ms.  Assessment/Plan Principal Problem:   Acute on chronic respiratory failure with hypoxia (HCC) Active Problems:   Essential hypertension   Wheelchair bound   Sepsis ([place])   Depression with anxiety   COPD exacerbation ([place])     1. Acute on chronic  hypoxic respiratory failure: Likely precipitated by acute purulent bronchitis and COPD exacerbation.  Pneumonia not ruled out despite negative chest x-ray.  Low index of suspicion for decompensated CHF or pulmonary embolism in the context of compliance with Eliquis at SNF and current febrile illness.  Patient received prolonged nebulization with 10 mg of albuterol, 0.5 mg Atrovent and Solu-Medrol 125 mg IV x1 by EMS on route to ED.  In ED received additional prolonged albuterol nebulization.  Remained in respiratory distress and hence placed on BiPAP.  Continue BiPAP and when able, try to wean to oxygen via nasal cannula.  Treat underlying cause. 2. Acute purulent bronchitis with COPD exacerbation: Check flu panel PCR, droplet isolation until flu negative, continue empirically started IV ceftriaxone and azithromycin.  Follow-up chest x-ray 4/27 to see if pneumonia reveals post hydration.  Current chest x-ray negative.  Continue DuoNeb scheduled, albuterol nebulizations as needed, IV Solu-Medrol and Pulmicort.  Current mild sinus tachycardia likely multifactorial related to acute respiratory distress, agitation/anxiety, multiple prolonged albuterol nebulizations.  Monitor on telemetry. 3. Sepsis: Present on admission as evidenced by fever, tachycardia, tachypnea.  Treated per sepsis protocol with aggressive IV fluids, IV ceftriaxone and azithromycin.  Follow blood cultures, flu panel PCR, urine microscopy. 4. Essential hypertension: Came in with mild hypertension but most recent blood pressures showed SBP in the 90s.  Temporarily hold antihypertensives/metoprolol.  Continue fluid bolus per sepsis protocol and maintenance IV fluids.  Closely monitor. 5. Diet controlled DM/pre-diabetes: Last A1c 5.6 on 08/09/2016.  Monitor CBGs while on steroids.  SSI if needed. 6. Pulmonary embolism: Diagnosed during hospitalization in February 2019 and was placed on Eliquis.  Received last dose on the night prior to admission.   Currently unsafe to take oral medications.  Hence IV heparin per pharmacy until able to consistently and safely take by mouth.  7. Anxiety and depression: Hold medications until mental status improved.  Patient apparently grieving from recent death of her husband 3 weeks ago. 8. Acute encephalopathy: Likely precipitated by febrile illness/sepsis, acute respiratory failure with hypoxia and medications (Ativan and fentanyl) that she received for agitation in ED.   9. History of frequent falls: As per son, patient's legs prescription for dinner table couple days prior to admission but no reported recent falls.   DVT prophylaxis: IV heparin per pharmacy Code Status: Partial code (BiPAP only) as discussed with patient's son/healthcare power of attorney at bedside. Family Communication: Discussed in detail with patient's son and healthcare power of attorney at bedside. Disposition Plan: Admit to stepdown unit.  Will likely need a couple days of inpatient care and then DC back to SNF. Consults called: None Admission status: Inpatient, stepdown.   Patient is critically ill with progressively worsening dyspnea, presented with sepsis and acute respiratory failure requiring sepsis protocol and BiPAP in ED.  Management as outlined above.  Critical care time spent in evaluating patient at bedside, discussing with RN and pharmacy: 70 minutes.  Vernell Leep MD [facility] Pager [phone]  If 7PM-7AM, please contact night-coverage [email or web address] Password [email or web address]  06/11/2017, 12:12 PM

## 2017-06-11 NOTE — Progress Notes (Signed)
Follow up ABG obtained on patient.  Results were taken on bipap of IPAP of 16, EPAP of 6, respiratory rate of 10, FIO2 of 50%.   Ref. Range 06/11/2017 15:10  Sample type Unknown ARTERIAL  pH, Arterial Latest Ref Range: 7.350 - 7.450  7.361  pCO2 arterial Latest Ref Range: 32.0 - 48.0 mmHg 50.3 (H)  pO2, Arterial Latest Ref Range: 83.0 - 108.0 mmHg 159.0 (H)  TCO2 Latest Ref Range: 22 - 32 mmol/L 30  Acid-Base Excess Latest Ref Range: 0.0 - 2.0 mmol/L 2.0  Bicarbonate Latest Ref Range: 20.0 - 28.0 mmol/L 28.4 (H)  O2 Saturation Latest Units: % 99.0  Patient temperature Unknown 98.6 F  Collection site Unknown RADIAL, [facility] T.Marland KitchenMarland Kitchen

## 2017-06-11 NOTE — Progress Notes (Signed)
RT note: patient started on 15mg  Albuterol nebulizer, with 0.5mg  Atrovent, at 0837.  Patient currently tolerating well.  Will continue to monitor.

## 2017-06-11 NOTE — Progress Notes (Signed)
ABG obtained on patient per MD order.  Patient was currently on bipap with IPAP of 14, EPAP of 6, respiratory rate of 10, FIO2 of 50%.  Increased IPAP to 16.  Results were given to MD.  Will continue to monitor.    Ref. Range 06/11/2017 12:44  Sample type Unknown ARTERIAL  pH, Arterial Latest Ref Range: 7.350 - 7.450  7.250 (L)  pCO2 arterial Latest Ref Range: 32.0 - 48.0 mmHg 60.3 (H)  pO2, Arterial Latest Ref Range: 83.0 - 108.0 mmHg 134.0 (H)  TCO2 Latest Ref Range: 22 - 32 mmol/L 28  Acid-base deficit Latest Ref Range: 0.0 - 2.0 mmol/L 2.0  Bicarbonate Latest Ref Range: 20.0 - 28.0 mmol/L 26.0  O2 Saturation Latest Units: % 98.0  Patient temperature Unknown 101.0 F  Collection site Unknown RADIAL, [facility] T.Marland KitchenMarland Kitchen

## 2017-06-11 NOTE — Progress Notes (Signed)
   06/11/17 1200  Clinical Encounter Type  Visited With Patient and family together;Health care provider  Visit Type ED  Stress Factors  Family Stress Factors Loss   Encountered the son just outside the trauma room.  He seemed upset.  Indicated his step-father died just a few weeks ago and his mother, the patient, is struggling.  Son had to make decisions for the step father and he is struggling with that.  Patient was resting.  When I checked back son seems to be in a better place sitting bedside.  Will follow and support as needed. Chaplain Katherene Ponto

## 2017-06-11 NOTE — ED Notes (Signed)
Pur Wick placed, instructions given for use. 

## 2017-06-12 ENCOUNTER — Inpatient Hospital Stay (HOSPITAL_COMMUNITY): Payer: Medicare Other

## 2017-06-12 DIAGNOSIS — J9621 Acute and chronic respiratory failure with hypoxia: Secondary | ICD-10-CM

## 2017-06-12 LAB — BASIC METABOLIC PANEL
ANION GAP: 8 (ref 5–15)
BUN: 9 mg/dL (ref 6–20)
CHLORIDE: 106 mmol/L (ref 101–111)
CO2: 27 mmol/L (ref 22–32)
Calcium: 9.2 mg/dL (ref 8.9–10.3)
Creatinine, Ser: 0.43 mg/dL — ABNORMAL LOW (ref 0.44–1.00)
GFR calc Af Amer: >60 mL/min (ref >60)
GFR calc non Af Amer: >60 mL/min (ref >60)
Glucose, Bld: 151 mg/dL — ABNORMAL HIGH (ref 65–99)
POTASSIUM: 3.1 mmol/L — AB (ref 3.5–5.1)
Sodium: 141 mmol/L (ref 135–145)

## 2017-06-12 LAB — CBC
HEMATOCRIT: 35.3 % — AB (ref 36.0–46.0)
HEMOGLOBIN: 11.4 g/dL — AB (ref 12.0–15.0)
MCH: 31.3 pg (ref 26.0–34.0)
MCHC: 32.3 g/dL (ref 30.0–36.0)
MCV: 97 fL (ref 78.0–100.0)
Platelets: 186 10*3/uL (ref 150–400)
RBC: 3.64 MIL/uL — AB (ref 3.87–5.11)
RDW: 14.5 % (ref 11.5–15.5)
WBC: 6.1 10*3/uL (ref 4.0–10.5)

## 2017-06-12 LAB — BLOOD CULTURE ID PANEL (REFLEXED)
ACINETOBACTER BAUMANNII: NOT DETECTED
CANDIDA PARAPSILOSIS: NOT DETECTED
Candida albicans: NOT DETECTED
Candida glabrata: NOT DETECTED
Candida krusei: NOT DETECTED
Candida tropicalis: NOT DETECTED
ENTEROBACTERIACEAE SPECIES: NOT DETECTED
ENTEROCOCCUS SPECIES: NOT DETECTED
Enterobacter cloacae complex: NOT DETECTED
Escherichia coli: NOT DETECTED
HAEMOPHILUS INFLUENZAE: NOT DETECTED
KLEBSIELLA OXYTOCA: NOT DETECTED
Klebsiella pneumoniae: NOT DETECTED
LISTERIA MONOCYTOGENES: NOT DETECTED
METHICILLIN RESISTANCE: DETECTED — AB
NEISSERIA MENINGITIDIS: NOT DETECTED
PSEUDOMONAS AERUGINOSA: NOT DETECTED
Proteus species: NOT DETECTED
STAPHYLOCOCCUS AUREUS BCID: NOT DETECTED
STREPTOCOCCUS SPECIES: NOT DETECTED
Serratia marcescens: NOT DETECTED
Staphylococcus species: DETECTED — AB
Streptococcus agalactiae: NOT DETECTED
Streptococcus pneumoniae: NOT DETECTED
Streptococcus pyogenes: NOT DETECTED

## 2017-06-12 LAB — APTT
APTT: 64 s — AB (ref 24–36)
aPTT: 54 seconds — ABNORMAL HIGH (ref 24–36)

## 2017-06-12 LAB — GLUCOSE, CAPILLARY
GLUCOSE-CAPILLARY: 123 mg/dL — AB (ref 65–99)
GLUCOSE-CAPILLARY: 155 mg/dL — AB (ref 65–99)
Glucose-Capillary: 123 mg/dL — ABNORMAL HIGH (ref 65–99)

## 2017-06-12 LAB — HEPARIN LEVEL (UNFRACTIONATED)
HEPARIN UNFRACTIONATED: 1.98 [IU]/mL — AB (ref 0.30–0.70)
Heparin Unfractionated: >2.2 IU/mL — ABNORMAL HIGH (ref 0.30–0.70)

## 2017-06-12 MED ORDER — POTASSIUM CHLORIDE CRYS ER 20 MEQ PO TBCR
40.0000 meq | EXTENDED_RELEASE_TABLET | Freq: Every day | ORAL | Status: DC
  Administered 2017-06-12 – 2017-06-15 (×4): 40 meq via ORAL
  Filled 2017-06-12 (×5): qty 2

## 2017-06-12 MED ORDER — MORPHINE SULFATE (CONCENTRATE) 10 MG/0.5ML PO SOLN: 5.0000 mg | ORAL | Status: DC | PRN

## 2017-06-12 MED ORDER — IPRATROPIUM-ALBUTEROL 0.5-2.5 (3) MG/3ML IN SOLN
3.0000 mL | Freq: Three times a day (TID) | RESPIRATORY_TRACT | Status: DC
  Administered 2017-06-13 – 2017-06-15 (×7): 3 mL via RESPIRATORY_TRACT
  Filled 2017-06-12 (×8): qty 3

## 2017-06-12 NOTE — Progress Notes (Signed)
[name] for Heparin Indication: H/o PE Patient Measurements: Height: 5\' 8"  (172.7 cm) Weight: 194 lb (88 kg) IBW/kg (Calculated) : 63.9 Heparin Dosing Weight: 81 kg   Vital Signs: Temp: 97.8 F (36.6 C) (04/27 0032) Temp Source: Axillary (04/27 0032) BP: 116/63 (04/27 0032) Pulse Rate: 81 (04/27 0032)  Labs: Recent Labs    06/11/17 0856 06/11/17 2230  HGB 13.9  --   HCT 42.1  --   PLT 235  --   APTT  --  117*  HEPARINUNFRC  --  >2.20*  CREATININE 0.49  --   TROPONINI <0.03  --     Estimated Creatinine Clearance: 59.7 mL/min (by C-G formula based on SCr of 0.49 mg/dL).   Medical History: Past Medical History:  Diagnosis Date  . Arthritis   . Asthma   . Breast cancer ([place]) 2006   right w/lumpectomy  . COPD (chronic obstructive pulmonary disease) ([place])   . Depression   . Diverticulosis of colon   . Endometrial hyperplasia 01/1996   fibroids, adenomyosis  . Fall 02/2017  . Frequent UTI   . History of colon polyps   . Hypertension   . Insomnia   . Osteopenia 12/08   hip  . Pre-diabetes   . Spinal stenosis 2001  . Vitamin D deficiency    Assessment: 15 YOF from SNF on apixaban at home for h/o of bilateral PE's. Patient is here with worsening cough and difficulty breathing. Pharmacy consulted to switch to IV heparin due to poor PO intake. Last dose of apixaban was on 4/25. H/H and Plt wnl. Will be using aPTT's to guide dosing for now given apixaban influence on anti-Xa levels. APTT is elevated tonight, no issues per RN.   Goal of Therapy:  Heparin level 0.3-0.7 units/ml aPTT 66-102s seconds Monitor platelets by anticoagulation protocol: Yes   Plan: -Decrease heparin to 1100 units/hr -F/u 8 hr aPTT/HL -Monitor daily aPTT/HL, CBC and s/s of bleeding   Narda Bonds, PharmD, BCPS Clinical Pharmacist Phone: [phone]

## 2017-06-12 NOTE — Progress Notes (Signed)
PHARMACY - PHYSICIAN COMMUNICATION CRITICAL VALUE ALERT - BLOOD CULTURE IDENTIFICATION (BCID)  [name] is an 84 y.o. female who presented to [facility] on 06/11/2017 with a chief complaint of productive cough and difficulty breathing   Assessment:  COPD exacerbation / bronchitis  Name of physician (or Provider) Contacted: Dr. Verlon Au   Current antibiotics: Ceftriaxone & Azithromycin   Changes to prescribed antibiotics recommended:  Patient is on recommended antibiotics - No changes needed  Results for orders placed or performed during the hospital encounter of 06/11/17  Blood Culture ID Panel (Reflexed) (Collected: 06/11/2017  9:01 AM)  Result Value Ref Range   Enterococcus species NOT DETECTED NOT DETECTED   Listeria monocytogenes NOT DETECTED NOT DETECTED   Staphylococcus species DETECTED (A) NOT DETECTED   Staphylococcus aureus NOT DETECTED NOT DETECTED   Methicillin resistance DETECTED (A) NOT DETECTED   Streptococcus species NOT DETECTED NOT DETECTED   Streptococcus agalactiae NOT DETECTED NOT DETECTED   Streptococcus pneumoniae NOT DETECTED NOT DETECTED   Streptococcus pyogenes NOT DETECTED NOT DETECTED   Acinetobacter baumannii NOT DETECTED NOT DETECTED   Enterobacteriaceae species NOT DETECTED NOT DETECTED   Enterobacter cloacae complex NOT DETECTED NOT DETECTED   Escherichia coli NOT DETECTED NOT DETECTED   Klebsiella oxytoca NOT DETECTED NOT DETECTED   Klebsiella pneumoniae NOT DETECTED NOT DETECTED   Proteus species NOT DETECTED NOT DETECTED   Serratia marcescens NOT DETECTED NOT DETECTED   Haemophilus influenzae NOT DETECTED NOT DETECTED   Neisseria meningitidis NOT DETECTED NOT DETECTED   Pseudomonas aeruginosa NOT DETECTED NOT DETECTED   Candida albicans NOT DETECTED NOT DETECTED   Candida glabrata NOT DETECTED NOT DETECTED   Candida krusei NOT DETECTED NOT DETECTED   Candida parapsilosis NOT DETECTED NOT DETECTED   Candida tropicalis NOT DETECTED NOT  DETECTED    Albertina Parr, PharmD., BCPS Clinical Pharmacist Clinical phone for 06/12/17/ until 3:30pm: [phone] If after 3:30pm, please call main pharmacy at: [phone]

## 2017-06-12 NOTE — Clinical Social Work Note (Signed)
Clinical Social Work Assessment  Patient Details  Name: [name] MRN: [id number] Date of Birth: 1930/07/05  Date of referral:  06/12/17               Reason for consult:  Discharge Planning                Permission sought to share information with:  Facility Art therapist granted to share information::  Yes, Verbal Permission Granted  Name::        Agency::  [facility]  Relationship::     Contact Information:     Housing/Transportation Living arrangements for the past 2 months:  [place] of Information:  Patient, Medical Team, Facility Patient Interpreter Needed:  None Criminal Activity/Legal Involvement Pertinent to Current Situation/Hospitalization:  No - Comment as needed Significant Relationships:  Adult Children Lives with:  Facility Resident Do you feel safe going back to the place where you live?  Yes Need for family participation in patient care:  Yes (Comment)  Care giving concerns:  Patient is a long-term resident at [facility].   Social Worker assessment / plan:  CSW met with patient. No supports at bedside. CSW introduced role and explained that discharge planning would be discussed. Patient confirmed she is a long-term resident at [facility] and plans to return at discharge. Per admissions coordinator, family is not paying for a bed hold. No further concerns. CSW encouraged patient to contact CSW as needed. CSW will continue to follow patient for support and facilitate discharge to SNF once medically stable.  Employment status:  Retired Nurse, adult PT Recommendations:  Not assessed at this time Information / Referral to community resources:  [place]  Patient/Family's Response to care:  Patient agreeable to return to SNF. Patient's children supportive and involved in patient's care. Patient appreciated social work intervention.  Patient/Family's  Understanding of and Emotional Response to Diagnosis, Current Treatment, and Prognosis:  Patient has a good understanding of the reason for admission and plan to return to SNF at discharge. Patient appears happy with hospital care.  Emotional Assessment Appearance:  Appears stated age Attitude/Demeanor/Rapport:  Engaged, Gracious Affect (typically observed):  Accepting, Appropriate, Calm, Pleasant Orientation:  Oriented to Self, Oriented to Place, Oriented to  Time, Oriented to Situation Alcohol / Substance use:  Never Used Psych involvement (Current and /or in the community):  No (Comment)  Discharge Needs  Concerns to be addressed:  Care Coordination Readmission within the last 30 days:  No Current discharge risk:  None Barriers to Discharge:  Continued Medical Work up   Candie Chroman, LCSW 06/12/2017, 1:54 PM

## 2017-06-12 NOTE — NC FL2 (Signed)
[place] LEVEL OF CARE SCREENING TOOL     IDENTIFICATION  Patient Name: [name] Birthdate: 07-Mar-1930 Sex: female Admission Date (Current Location): 06/11/2017  [facility] and [place] Number:  Herbalist and Address:  [facility], [place] [address] [place], [place] [address]      Provider Number: [id number]  Attending Physician Name and Address:  Nita Sells, MD  Relative Name and Phone Number:       Current Level of Care: Hospital Recommended Level of Care: [place] Prior Approval Number:    Date Approved/Denied:   PASRR Number: [id number]  Discharge Plan: SNF    Current Diagnoses: Patient Active Problem List   Diagnosis Date Noted  . Laceration of forehead   . Acute on chronic respiratory failure with hypoxia ([place]) 03/15/2017  . Fall 03/15/2017  . Depression with anxiety 03/15/2017  . COPD exacerbation ([place]) 03/15/2017  . Acute lower UTI   . Sepsis ([place]) 11/08/2016  . Morbid obesity due to excess calories ([place]) 09/02/2016  . Esophageal dysfunction 09/01/2016  . Wheelchair bound 08/09/2016  . Wound healing, delayed 08/09/2016  . Chronic ulcer of right leg ([place]) 07/07/2016  . Failed total hip arthroplasty ([place]) 07/10/2015  . Breast cancer, right breast ([place]) 07/18/2014  . CONCUSSION WITH LOC OF UNSPECIFIED DURATION 07/19/2009  . OTH SYMPTOMS INVOLVING RESPIRATORY SYSTEM&CHEST 11/12/2008  . UNSPECIFIED DISORDER OF ANKLE AND FOOT JOINT 05/11/2008  . CERUMEN IMPACTION, BILATERAL 05/04/2008  . Dysmetabolic syndrome X 56/21/3086  . UNS ADVRS EFF UNS RX MEDICINAL&BIOLOGICAL SBSTNC 08/12/2007  . Other and unspecified hyperlipidemia 04/01/2007  . INSOMNIA, PERSISTENT 09/09/2006  . Essential hypertension 09/09/2006  . DIVERTICULOSIS, COLON 09/09/2006  . Osteoarthritis 09/09/2006  . Spinal stenosis 09/09/2006  . COLONIC POLYPS, HX OF 09/09/2006    Orientation RESPIRATION BLADDER Height &  Weight     Self, Situation, Place, Time  O2(Nasal Canula 3 L. Bipap at night: 16/6 at 40%.) Incontinent Weight: 201 lb 11.5 oz (91.5 kg) Height:  5\' 8"  (172.7 cm)  BEHAVIORAL SYMPTOMS/MOOD NEUROLOGICAL BOWEL NUTRITION STATUS  (None) (None) Continent Diet(Currently NPO. See discharge summary when available.)  AMBULATORY STATUS COMMUNICATION OF NEEDS Skin     Verbally Skin abrasions, Bruising                       Personal Care Assistance Level of Assistance              Functional Limitations Info  Sight, Hearing, Speech Sight Info: Adequate Hearing Info: Adequate Speech Info: Adequate    SPECIAL CARE FACTORS FREQUENCY  Blood pressure                    Contractures Contractures Info: Not present    Additional Factors Info  Code Status, Allergies, Psychotropic, Isolation Precautions Code Status Info: Partial: Use NIPPV/BiPAp only if indicated Allergies Info: Oxycodone Hcl, Morphine Sulfate, Sulfamethoxazole. Psychotropic Info: Depression, Anxiety: No psychotropic meds.   Isolation Precautions Info: Contact: MRSA     Current Medications (06/12/2017):  This is the current hospital active medication list Current Facility-Administered Medications  Medication Dose Route Frequency Provider Last Rate Last Dose  . acetaminophen (TYLENOL) tablet 650 mg  650 mg Oral Q6H PRN Hongalgi, Lenis Dickinson, MD       Or  . acetaminophen (TYLENOL) suppository 650 mg  650 mg Rectal Q6H PRN Hongalgi, Lenis Dickinson, MD      . albuterol (PROVENTIL) (2.5 MG/3ML)  0.083% nebulizer solution 2.5 mg  2.5 mg Nebulization Q2H PRN Hongalgi, Anand D, MD      . azithromycin (ZITHROMAX) 500 mg in sodium chloride 0.9 % 250 mL IVPB  500 mg Intravenous Q24H Modena Jansky, MD   Stopped at 06/12/17 1022  . budesonide (PULMICORT) nebulizer solution 0.5 mg  0.5 mg Nebulization BID Vernell Leep D, MD   0.5 mg at 06/12/17 0830  . cefTRIAXone (ROCEPHIN) 2 g in sodium chloride 0.9 % 100 mL IVPB  2 g Intravenous  Q24H Modena Jansky, MD   Stopped at 06/12/17 [phone]  . fluticasone (FLONASE) 50 MCG/ACT nasal spray 1 spray  1 spray Each Nare BID Vernell Leep D, MD   1 spray at 06/12/17 4268  . heparin ADULT infusion 100 units/mL (25000 units/264mL sodium chloride 0.45%)  1,100 Units/hr Intravenous Continuous Erenest Blank, RPH 11 mL/hr at 06/12/17 0040 1,100 Units/hr at 06/12/17 0040  . ipratropium-albuterol (DUONEB) 0.5-2.5 (3) MG/3ML nebulizer solution 3 mL  3 mL Nebulization Q6H Hongalgi, Anand D, MD   3 mL at 06/12/17 0830  . methylPREDNISolone sodium succinate (SOLU-MEDROL) 125 mg/2 mL injection 60 mg  60 mg Intravenous Q8H Hongalgi, Lenis Dickinson, MD   60 mg at 06/12/17 0502  . ondansetron (ZOFRAN) tablet 4 mg  4 mg Oral Q6H PRN Hongalgi, Lenis Dickinson, MD       Or  . ondansetron (ZOFRAN) injection 4 mg  4 mg Intravenous Q6H PRN Hongalgi, Anand D, MD      . polyvinyl alcohol (LIQUIFILM TEARS) 1.4 % ophthalmic solution 1 drop  1 drop Both Eyes BID Modena Jansky, MD   1 drop at 06/12/17 3419  . potassium chloride SA (K-DUR,KLOR-CON) CR tablet 40 mEq  40 mEq Oral Daily Nita Sells, MD   40 mEq at 06/12/17 0942  . sodium chloride flush (NS) 0.9 % injection 3 mL  3 mL Intravenous Q12H Modena Jansky, MD   3 mL at 06/12/17 6222     Discharge Medications: Please see discharge summary for a list of discharge medications.  Relevant Imaging Results:  Relevant Lab Results:   Additional Information SS#: [id number]  Candie Chroman, LCSW

## 2017-06-12 NOTE — Progress Notes (Signed)
[place] for Heparin Indication: H/o PE  Patient Measurements: Height: 5\' 8"  (172.7 cm) Weight: 201 lb 11.5 oz (91.5 kg) IBW/kg (Calculated) : 63.9 Heparin Dosing Weight: 81 kg   Vital Signs: Temp: 98.8 F (37.1 C) (04/27 0755) Temp Source: Oral (04/27 0755) BP: 158/81 (04/27 0755) Pulse Rate: 106 (04/27 0755)  Labs: Recent Labs    06/11/17 0856 06/11/17 2230 06/12/17 0220 06/12/17 0849  HGB 13.9  --  11.4*  --   HCT 42.1  --  35.3*  --   PLT 235  --  186  --   APTT  --  117*  --  54*  HEPARINUNFRC  --  >2.20*  --  1.98*  CREATININE 0.49  --  0.43*  --   TROPONINI <0.03  --   --   --    Estimated Creatinine Clearance: 60.8 mL/min (A) (by C-G formula based on SCr of 0.43 mg/dL (L)).  Assessment: 48 yoF presenting with worsening cough and difficulty breathing, with h/o bilateral PE on apixaban PTA. Pharmacy consulted to dose IV heparin due to poor PO intake. Last dose of apixaban was on 4/25. Will be using aPTTs to guide heparin dosing for now given apixaban influence on anti-Xa levels. aPTT this AM is below goal at 54. Per RN, patient lost IV access and heparin infusion was off for ~1 hour but is now resumed. Hgb 13.9>>11.4, pltc WNL. No bleeding noted.  Goal of Therapy:  Heparin level 0.3-0.7 units/ml aPTT 66-102s seconds Monitor platelets by anticoagulation protocol: Yes   Plan: Continue heparin at 1100 units/hr for now Will check another heparin level later today to confirm Daily aPTT/heparin level and CBC Monitor s/sx of bleeding F/u plans for transition back to [place]. [name], PharmD PGY1 Pharmacy Resident Pager: [phone] 06/12/17   11:00 AM

## 2017-06-12 NOTE — Progress Notes (Signed)
Hospitalist progress note   [name]  JIR:[id number] DOB: 07-Jun-1930 DOA: 06/11/2017 PCP: Reynold Bowen, MD   Specialists:    Brief Narrative:  53 COPD/asthma, bipolar, recurrent UTI, HTN, TTY 2 DM, breast cancer + lumpectomy, recurrent hospitalizations-last one 2/14-2/17 2/2 acute hypoxic respiratory failure + bilateral PE + cor pulmonale Admit from skilled nursing 5-day history productive cough brown sputum difficulty breathing-in the ED given albuterol Atrovent Solu-Medrol-ultimately placed on BiPAP and as was agitated received fentanyl and Ativan She is a partial code according to son  Assessment & Plan:   Acute respiratory failure-bronchitis versus AE COPD-continue BiPAP, repeat ABG this a.m. last ABG 4/26 PCO2 60 on admission pH 7.2 continue IV steroids 60 every possible component cor pulmonale right-sided heart failure in setting of recent bilateral PE-8  ?  Pneumonia-continue CAP coverage azithromycin, ceftriaxone-Flu panel [-]  Cor pulmonale,?  Decompensated right-sided heart failureJudicious saline 60 cc/h-once evaluated in a.m. will decide on diuresis-CXR from this a.m. personally reviewed shows no active disease process  A1c 5.6-monitor sugars  Bipolar-probably component of encephalopathy secondary to polypharmacy.  Acute encephalopathy hypoxia, CO2 retention, febrile illness all playing a role on at the bedside on 4/27 reports a history of inappropriate use of opiates and other medications which may be a component of her hypoxia  Bilateral PE recently 2/14-continue IV heparin for now but will need to discuss transition back to Eliquis once she is able to reliably take p.o. and does not need BiPAP  History of frequent falls-fall at dinner table a couple of days ago.   DVT prophylaxis: heparin  Code Status:   Partial--- I filled out a most form with this done on 4/27 and discussed advanced care planning for 10 minutes direct face-to-face time   family Communication:    none Disposition Plan: SDU/ICU   Consultants:   Pulm  Procedures:   none  Antimicrobials:   Ceftriaxone  Azithromycin   Subjective:  Awake but confabulating a little bit No fever no chills, able to tell me where she has, not able to tell me date, cannot tell me the president's name In the past regarding a nebulization  Objective: Vitals:   06/12/17 0032 06/12/17 0129 06/12/17 0317 06/12/17 0404  BP: 116/63   (!) 141/73  Pulse: 81 85 91 93  Resp: 15 19 20  (!) 24  Temp: 97.8 F (36.6 C)   98.4 F (36.9 C)  TempSrc: Axillary   Axillary  SpO2: 99% 99% 97% 95%  Weight:    91.5 kg (201 lb 11.5 oz)  Height:        Intake/Output Summary (Last 24 hours) at 06/12/2017 0753 Last data filed at 06/11/2017 1223 Gross per 24 hour  Intake 3000 ml  Output -  Net 3000 ml   Filed Weights   06/11/17 0834 06/12/17 0404  Weight: 88 kg (194 lb) 91.5 kg (201 lb 11.5 oz)    Examination: Awake alert oriented no distress--Work of breathing seems benign and nonlabored she has been off of BiPAP since 4 AM Chest is clear S1-S2 looks to be in sinus tach/possible flutter on monitor Abdomen is soft nontender no rebound No lower extremity edema Neurologically intact Musculoskeletal is intact  Data Reviewed: I have personally reviewed following labs and imaging studies  CBC: Recent Labs  Lab 06/11/17 0856 06/12/17 0220  WBC 11.7* 6.1  NEUTROABS 8.6*  --   HGB 13.9 11.4*  HCT 42.1 35.3*  MCV 97.5 97.0  PLT 235 751   Basic Metabolic  Panel: Recent Labs  Lab 06/11/17 0856 06/12/17 0220  NA 137 141  K 4.0 3.1*  CL 98* 106  CO2 24 27  GLUCOSE 100* 151*  BUN 8 9  CREATININE 0.49 0.43*  CALCIUM 9.3 9.2   GFR: Estimated Creatinine Clearance: 60.8 mL/min (A) (by C-G formula based on SCr of 0.43 mg/dL (L)). Liver Function Tests: Recent Labs  Lab 06/11/17 0856  AST 36  ALT 30  ALKPHOS 95  BILITOT 1.1  PROT 6.6  ALBUMIN 3.1*   No results for input(s): LIPASE, AMYLASE  in the last 168 hours. No results for input(s): AMMONIA in the last 168 hours. Coagulation Profile: No results for input(s): INR, PROTIME in the last 168 hours. Cardiac Enzymes:  Radiology Studies: Reviewed images personally in health database   Scheduled Meds: . budesonide  0.5 mg Nebulization BID  . fluticasone  1 spray Each Nare BID  . ipratropium-albuterol  3 mL Nebulization Q6H  . methylPREDNISolone (SOLU-MEDROL) injection  60 mg Intravenous Q8H  . polyvinyl alcohol  1 drop Both Eyes BID  . sodium chloride flush  3 mL Intravenous Q12H   Continuous Infusions: . sodium chloride 60 mL/hr at 06/12/17 0416  . azithromycin Stopped (06/11/17 1107)  . cefTRIAXone (ROCEPHIN)  IV Stopped (06/11/17 7530)  . heparin 1,100 Units/hr (06/12/17 0040)     LOS: 1 day    Time spent: [place], MD [facility]  If 7PM-7AM, please contact night-coverage [email or web address] Password [facility] 06/12/2017, 7:53 AM

## 2017-06-12 NOTE — Progress Notes (Signed)
PT Cancellation Note  Patient Details Name: [name] MRN: [id number] DOB: 1931/02/15   Cancelled Treatment:    Reason Eval/Treat Not Completed: Medical issues which prohibited therapy. Pt removed from bipap this AM but is currently still NPO. Pt politely requesting holding PT eval until she is allowed to at least drink water. She reports at baseline, she is able to transfer to/from her wheelchair with supervision but is dependent for w/c mobility. PT to re-attempt tomorrow.   Lorriane Shire 06/12/2017, 3:28 PM

## 2017-06-12 NOTE — Progress Notes (Signed)
ANTICOAGULATION CONSULT NOTE   Pharmacy Consult for Heparin Indication: H/o PE  Patient Measurements: Height: 5\' 8"  (172.7 cm) Weight: 201 lb 11.5 oz (91.5 kg) IBW/kg (Calculated) : 63.9 Heparin Dosing Weight: 81 kg   Vital Signs: Temp: 98.7 F (37.1 C) (04/27 1644) Temp Source: Oral (04/27 1644) BP: 157/84 (04/27 1644) Pulse Rate: 89 (04/27 1926)  Labs: Recent Labs    06/11/17 0856 06/11/17 2230 06/12/17 0220 06/12/17 0849 06/12/17 1808  HGB 13.9  --  11.4*  --   --   HCT 42.1  --  35.3*  --   --   PLT 235  --  186  --   --   APTT  --  117*  --  54* 64*  HEPARINUNFRC  --  >2.20*  --  1.98*  --   CREATININE 0.49  --  0.43*  --   --   TROPONINI <0.03  --   --   --   --    Estimated Creatinine Clearance: 60.8 mL/min (A) (by C-G formula based on SCr of 0.43 mg/dL (L)).  Assessment: 84 yoF presenting with worsening cough and difficulty breathing, with h/o bilateral PE on apixaban PTA. Pharmacy consulted to dose IV heparin due to poor PO intake. Last dose of apixaban was on 4/25. Will be using aPTTs to guide heparin dosing for now given apixaban influence on anti-Xa levels. aPTT this evening remains subtherapeutic at 64.   Goal of Therapy:  Heparin level 0.3-0.7 units/ml aPTT 66-102s seconds Monitor platelets by anticoagulation protocol: Yes   Plan: Increase heparin to 1200 units/hr for now F/u AM aptt/HL  Daily aPTT/heparin level and CBC Monitor s/sx of bleeding F/u plans for transition back to [place], PharmD., BCPS Clinical Pharmacist If after 3:30pm, please call main pharmacy at: [phone]

## 2017-06-13 LAB — GLUCOSE, CAPILLARY
GLUCOSE-CAPILLARY: 152 mg/dL — AB (ref 65–99)
Glucose-Capillary: 166 mg/dL — ABNORMAL HIGH (ref 65–99)

## 2017-06-13 LAB — CBC
HEMATOCRIT: 34.8 % — AB (ref 36.0–46.0)
HEMOGLOBIN: 11.2 g/dL — AB (ref 12.0–15.0)
MCH: 30.8 pg (ref 26.0–34.0)
MCHC: 32.2 g/dL (ref 30.0–36.0)
MCV: 95.6 fL (ref 78.0–100.0)
Platelets: 216 10*3/uL (ref 150–400)
RBC: 3.64 MIL/uL — AB (ref 3.87–5.11)
RDW: 14.5 % (ref 11.5–15.5)
WBC: 7.8 10*3/uL (ref 4.0–10.5)

## 2017-06-13 LAB — RENAL FUNCTION PANEL
ALBUMIN: 2.4 g/dL — AB (ref 3.5–5.0)
Anion gap: 14 (ref 5–15)
BUN: 15 mg/dL (ref 6–20)
CO2: 24 mmol/L (ref 22–32)
CREATININE: 0.65 mg/dL (ref 0.44–1.00)
Calcium: 8.8 mg/dL — ABNORMAL LOW (ref 8.9–10.3)
Chloride: 103 mmol/L (ref 101–111)
GFR calc Af Amer: >60 mL/min (ref >60)
Glucose, Bld: 186 mg/dL — ABNORMAL HIGH (ref 65–99)
PHOSPHORUS: 2.4 mg/dL — AB (ref 2.5–4.6)
Potassium: 3.8 mmol/L (ref 3.5–5.1)
SODIUM: 141 mmol/L (ref 135–145)

## 2017-06-13 LAB — APTT: APTT: 100 s — AB (ref 24–36)

## 2017-06-13 LAB — HEPARIN LEVEL (UNFRACTIONATED)

## 2017-06-13 MED ORDER — BUPROPION HCL ER (XL) 300 MG PO TB24
300.0000 mg | ORAL_TABLET | ORAL | Status: DC
  Administered 2017-06-13: 300 mg via ORAL
  Filled 2017-06-13: qty 1

## 2017-06-13 MED ORDER — METOPROLOL TARTRATE 12.5 MG HALF TABLET
12.5000 mg | ORAL_TABLET | Freq: Two times a day (BID) | ORAL | Status: DC
  Administered 2017-06-13 – 2017-06-15 (×5): 12.5 mg via ORAL
  Filled 2017-06-13 (×5): qty 1

## 2017-06-13 MED ORDER — ASPIRIN EC 325 MG PO TBEC
325.0000 mg | DELAYED_RELEASE_TABLET | Freq: Every day | ORAL | Status: DC
  Administered 2017-06-13 – 2017-06-15 (×3): 325 mg via ORAL
  Filled 2017-06-13 (×3): qty 1

## 2017-06-13 MED ORDER — MIRABEGRON ER 25 MG PO TB24
50.0000 mg | ORAL_TABLET | Freq: Every day | ORAL | Status: DC
  Administered 2017-06-13 – 2017-06-15 (×3): 50 mg via ORAL
  Filled 2017-06-13 (×3): qty 2

## 2017-06-13 MED ORDER — PIPERACILLIN-TAZOBACTAM 3.375 G IVPB
3.3750 g | Freq: Three times a day (TID) | INTRAVENOUS | Status: DC
  Administered 2017-06-14: 3.375 g via INTRAVENOUS
  Filled 2017-06-13 (×2): qty 50

## 2017-06-13 MED ORDER — ESCITALOPRAM OXALATE 10 MG PO TABS
10.0000 mg | ORAL_TABLET | Freq: Every day | ORAL | Status: DC
  Administered 2017-06-13 – 2017-06-15 (×3): 10 mg via ORAL
  Filled 2017-06-13 (×3): qty 1

## 2017-06-13 MED ORDER — ACETAMINOPHEN 325 MG PO TABS
650.0000 mg | ORAL_TABLET | Freq: Four times a day (QID) | ORAL | Status: DC | PRN
  Administered 2017-06-14 – 2017-06-15 (×3): 650 mg via ORAL
  Filled 2017-06-13 (×3): qty 2

## 2017-06-13 MED ORDER — APIXABAN 5 MG PO TABS
5.0000 mg | ORAL_TABLET | Freq: Two times a day (BID) | ORAL | Status: DC
  Administered 2017-06-13 – 2017-06-15 (×5): 5 mg via ORAL
  Filled 2017-06-13 (×5): qty 1

## 2017-06-13 NOTE — Evaluation (Signed)
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, recently widowed 3 weeks ago, currently residing at [facility] for the last month, reportedly not on chronic oxygen, not mobile and moves around with the help of a wheelchair, PMH of asthma/COPD, anxiety, depression, recurrent UTI, HTN, diet-controlled DM 2, breast cancer status post lumpectomy, recent multiple hospitalizations (3 in the last 3 months), last one 04/01/2017-04/04/2017 for acute respiratory failure due to bilateral pulmonary embolism with cor pulmonale at which time she was discharged on Eliquis, presented to [facility] ED via EMS on 06/11/2017 with productive cough, fever and dyspnea.     Clinical Impression  Pt admitted with above diagnosis. Pt currently with functional limitations due to the deficits listed below (see PT Problem List). On eval, pt required +1-2 mod assist bed mobility and mod assist partial sit to stand. Pt unable to attain full, upright stance nor was she able to tolerate SPT bed to recliner. Pt on 3 L O2 throughout session with SpO2 96%. Crackles and wheezing noted with increased WOB during mobility. Despite being motivated to complete transfer to recliner, pt required return to supine at end of session. Pt will benefit from skilled PT to increase their independence and safety with mobility to allow discharge to the venue listed below.       Follow Up Recommendations SNF    Equipment Recommendations  None recommended by PT    Recommendations for Other Services       Precautions / Restrictions Precautions Precautions: Fall Restrictions Weight Bearing Restrictions: No      Mobility  Bed Mobility Overal bed mobility: Needs Assistance Bed Mobility: Supine to Sit;Sit to Supine     Supine to sit: Mod assist;HOB elevated Sit to supine: +2 for physical assistance;Mod assist    General bed mobility comments: +rail, cues for sequencing, use of bed pad to scoot to EOB and to scoot up in bed upon return to supine  Transfers Overall transfer level: Needs assistance Equipment used: 1 person hand held assist Transfers: Sit to/from Stand Sit to Stand: Mod assist         General transfer comment: Sit to stand x 3 trials. Pt unable to clear bottom from bed requiring return to sitting.  Ambulation/Gait             General Gait Details: nonambulatory  Stairs            Wheelchair Mobility    Modified Rankin (Stroke Patients Only)       Balance Overall balance assessment: Needs assistance Sitting-balance support: No upper extremity supported;Feet supported Sitting balance-Leahy Scale: Fair Sitting balance - Comments: Pt sat EOB x 5 minutes min guard assist.                                     Pertinent Vitals/Pain Pain Assessment: Faces Faces Pain Scale: Hurts even more Pain Location: "stitch in my side" when coughing (R lower flank) Pain Descriptors / Indicators: Grimacing;Guarding;Spasm Pain Intervention(s): Monitored during session;Repositioned    Home Living Family/patient expects to be discharged to:: Skilled nursing facility                 Additional Comments: [facility]    Prior Function Level of Independence: Needs assistance   Gait / Transfers Assistance Needed: supervision transfers. Wheelchair dependent. Dependent w/c  mobility/unable to self-propel.  ADL's / Homemaking Assistance Needed: SNF staff assists with all ADLs.         Hand Dominance   Dominant Hand: Right    Extremity/Trunk Assessment   Upper Extremity Assessment Upper Extremity Assessment: Generalized weakness    Lower Extremity Assessment Lower Extremity Assessment: Generalized weakness    Cervical / Trunk Assessment Cervical / Trunk Assessment: Kyphotic  Communication   Communication: No difficulties  Cognition  Arousal/Alertness: Awake/alert Behavior During Therapy: WFL for tasks assessed/performed Overall Cognitive Status: Within Functional Limits for tasks assessed                                        General Comments      Exercises     Assessment/Plan    PT Assessment Patient needs continued PT services  PT Problem List Decreased strength;Decreased mobility;Decreased activity tolerance;Cardiopulmonary status limiting activity;Pain;Decreased balance       PT Treatment Interventions Therapeutic activities;Therapeutic exercise;Patient/family education;Balance training;Functional mobility training    PT Goals (Current goals can be found in the Care Plan section)  Acute Rehab PT Goals Patient Stated Goal: get stronger PT Goal Formulation: With patient Time For Goal Achievement: 06/27/17 Potential to Achieve Goals: Fair    Frequency Min 2X/week   Barriers to discharge        Co-evaluation               AM-PAC PT "6 Clicks" Daily Activity  Outcome Measure Difficulty turning over in bed (including adjusting bedclothes, sheets and blankets)?: Unable Difficulty moving from lying on back to sitting on the side of the bed? : Unable Difficulty sitting down on and standing up from a chair with arms (e.g., wheelchair, bedside commode, etc,.)?: Unable Help needed moving to and from a bed to chair (including a wheelchair)?: A Lot Help needed walking in hospital room?: Total Help needed climbing 3-5 steps with a railing? : Total 6 Click Score: 7    End of Session Equipment Utilized During Treatment: Gait belt;Oxygen Activity Tolerance: Patient tolerated treatment well Patient left: in bed;with call bell/phone within reach;with bed alarm set Nurse Communication: Mobility status PT Visit Diagnosis: Other abnormalities of gait and mobility (R26.89);Muscle weakness (generalized) (M62.81)    Time: 5573-2202 PT Time Calculation (min) (ACUTE ONLY): 32  min   Charges:   PT Evaluation $PT Eval Moderate Complexity: 1 Mod PT Treatments $Therapeutic Activity: 8-22 mins   PT G Codes:        Lorrin Goodell, PT  Office # [phone] Pager [phone]   Lorriane Shire 06/13/2017, 9:39 AM

## 2017-06-13 NOTE — Progress Notes (Signed)
ANTICOAGULATION CONSULT NOTE   Pharmacy Consult for Heparin Indication: H/o PE  Patient Measurements: Height: 5\' 8"  (172.7 cm) Weight: 205 lb 14.6 oz (93.4 kg) IBW/kg (Calculated) : 63.9 Heparin Dosing Weight: 81 kg   Vital Signs: Temp: 98.1 F (36.7 C) (04/28 0457) Temp Source: Oral (04/28 0457) BP: 176/81 (04/28 0457) Pulse Rate: 94 (04/28 0457)  Labs: Recent Labs    06/11/17 0856  06/12/17 0220 06/12/17 0849 06/12/17 1808 06/13/17 0333  HGB 13.9  --  11.4*  --   --  11.2*  HCT 42.1  --  35.3*  --   --  34.8*  PLT 235  --  186  --   --  216  APTT  --    < >  --  54* 64* 100*  HEPARINUNFRC  --    < >  --  1.98* >2.20* >2.20*  CREATININE 0.49  --  0.43*  --   --   --   TROPONINI <0.03  --   --   --   --   --    < > = values in this interval not displayed.   Estimated Creatinine Clearance: 61.4 mL/min (A) (by C-G formula based on SCr of 0.43 mg/dL (L)).  Assessment: 71 yoF presenting with worsening cough and difficulty breathing, with h/o bilateral PE on apixaban PTA. Pharmacy consulted to dose IV heparin due to poor PO intake. Last dose of apixaban was on 4/25. Will be using aPTTs to guide heparin dosing for now given apixaban influence on anti-Xa levels. aPTT is now at high end of goal range at 100 this AM following a rate increase overnight. Hgb 11.2 stable, pltc WNL. No bleeding noted.  Goal of Therapy:  Heparin level 0.3-0.7 units/ml aPTT 66-102s seconds Monitor platelets by anticoagulation protocol: Yes   Plan: Decrease heparin to 1150 units/hr  Daily aPTT/heparin level and CBC Monitor s/sx of bleeding F/u plans for transition back to [place]. [name], PharmD PGY1 Pharmacy Resident Pager: [phone] 06/13/17   7:06 AM

## 2017-06-13 NOTE — Progress Notes (Signed)
Hospitalist progress note   [name]  CWC:[id number] DOB: 19-Nov-1930 DOA: 06/11/2017 PCP: Reynold Bowen, MD  Specialists:    Brief Narrative:  66 COPD/asthma, bipolar, recurrent UTI, HTN, TTY 2 DM, breast cancer + lumpectomy, recurrent hospitalizations-last one 2/14-2/17 2/2 acute hypoxic respiratory failure + bilateral PE + cor pulmonale Admit from skilled nursing 5-day history productive cough brown sputum difficulty breathing-in the ED given albuterol Atrovent Solu-Medrol-ultimately placed on BiPAP and as was agitated received fentanyl and Ativan She is a partial code according to son  Assessment & Plan:   Acute respiratory failure-bronchitis versus AE COPD-continue BiPAP, repeat ABG this a.m. last ABG 4/26 PCO2 60 on admission pH 7.2 continue IV steroids 60 every possible  ?component cor pulmonale  Aspiration Pneumonia-continue CAP coverage azithromycin, ceftriaxone-Flu panel [-]  Cor pulmonale?  Decompensated right-sided heart failure-keep normal saline locked and repeat x-ray to ensure no fluid in addition to aspiration  A1c 5.6-monitor sugars-blood sugar 1 52-2 48-tapering steroids  Bipolar-probably component of encephalopathy secondary to polypharmacy-Home meds include Robaxin 500, Remeron 7.5 at bedtime, Norco, Neurontin 300 3 times daily, Klonopin 1 mg at bedtime, Lexapro 10 daily, Wellbutrin 300 a.m. -We will reimplement only what we feel is absolutely indicated and this polypharmacy will need to be managed by her outpatient physician  Acute encephalopathy hypoxia, CO2 retention, febrile illness all playing a role Son at the bedside on 4/27 reports a history of inappropriate use of opiates and other medications which may be a component of her hypoxia  Bilateral PE recently 2/14-position from IV heparin to Eliquis  Dysphagia causing aspiration-speech therapy cleared patient for dysphagia 2 diet.  Son mentions that patient is impulsive and eats quickly which may be the  cause  History of frequent falls-fall at dinner table a couple of days ago.   DVT prophylaxis: heparin  Code Status:   Partial   family Communication:   Discussed with son on phone 4/28 disposition Plan: SDU/ICU   Consultants:   Pulm  Procedures:   none  Antimicrobials:   Ceftriaxone  Azithromycin   Subjective:  Much more awake alert satting 90% on 2 L Had some applesauce and is hungry No chest pain No fever   Objective: Vitals:   06/13/17 0457 06/13/17 0723 06/13/17 0728 06/13/17 0730  BP: (!) 176/81 (!) 146/125    Pulse: 94 (!) 105    Resp: (!) 23 (!) 22    Temp: 98.1 F (36.7 C) 98.8 F (37.1 C)    TempSrc: Oral Oral    SpO2: 95% 95% 95% 97%  Weight: 93.4 kg (205 lb 14.6 oz)     Height:        Intake/Output Summary (Last 24 hours) at 06/13/2017 1043 Last data filed at 06/13/2017 0929 Gross per 24 hour  Intake 2029.5 ml  Output 500 ml  Net 1529.5 ml   Filed Weights   06/11/17 0834 06/12/17 0404 06/13/17 0457  Weight: 88 kg (194 lb) 91.5 kg (201 lb 11.5 oz) 93.4 kg (205 lb 14.6 oz)    Examination:  no distress--Work of breathing seems benign Chest slight crackles S1-S2 looks to be in sinus tach Abdomen is soft nontender no rebound No lower extremity edema Neurologically intact Musculoskeletal is intact  Data Reviewed: I have personally reviewed following labs and imaging studies  CBC: Recent Labs  Lab 06/11/17 0856 06/12/17 0220 06/13/17 0333  WBC 11.7* 6.1 7.8  NEUTROABS 8.6*  --   --   HGB 13.9 11.4* 11.2*  HCT 42.1  35.3* 34.8*  MCV 97.5 97.0 95.6  PLT 235 186 517   Basic Metabolic Panel: Recent Labs  Lab 06/11/17 0856 06/12/17 0220  NA 137 141  K 4.0 3.1*  CL 98* 106  CO2 24 27  GLUCOSE 100* 151*  BUN 8 9  CREATININE 0.49 0.43*  CALCIUM 9.3 9.2   GFR: Estimated Creatinine Clearance: 61.4 mL/min (A) (by C-G formula based on SCr of 0.43 mg/dL (L)). Liver Function Tests: Recent Labs  Lab 06/11/17 0856  AST 36  ALT  30  ALKPHOS 95  BILITOT 1.1  PROT 6.6  ALBUMIN 3.1*   No results for input(s): LIPASE, AMYLASE in the last 168 hours. No results for input(s): AMMONIA in the last 168 hours. Coagulation Profile: No results for input(s): INR, PROTIME in the last 168 hours. Cardiac Enzymes:  Radiology Studies: Reviewed images personally in health database   Scheduled Meds: . budesonide  0.5 mg Nebulization BID  . fluticasone  1 spray Each Nare BID  . ipratropium-albuterol  3 mL Nebulization TID  . methylPREDNISolone (SOLU-MEDROL) injection  60 mg Intravenous Q8H  . polyvinyl alcohol  1 drop Both Eyes BID  . potassium chloride  40 mEq Oral Daily  . sodium chloride flush  3 mL Intravenous Q12H   Continuous Infusions: . azithromycin 500 mg (06/13/17 0903)  . cefTRIAXone (ROCEPHIN)  IV Stopped (06/13/17 0834)  . heparin 1,150 Units/hr (06/13/17 0929)     LOS: 2 days    Time spent: [place], MD [facility]  If 7PM-7AM, please contact night-coverage [email or web address] Password [facility] 06/13/2017, 10:43 AM

## 2017-06-13 NOTE — Evaluation (Signed)
Clinical/Bedside Swallow Evaluation Patient Details  Name: [name] MRN: [id number] Date of Birth: 05-09-1930  Today's Date: 06/13/2017 Time: SLP Start Time (ACUTE ONLY): 4098 SLP Stop Time (ACUTE ONLY): 1022 SLP Time Calculation (min) (ACUTE ONLY): 29 min  Past Medical History:  Past Medical History:  Diagnosis Date  . Arthritis   . Asthma   . Breast cancer ([place]) 2006   right w/lumpectomy  . COPD (chronic obstructive pulmonary disease) ([place])   . Depression   . Diverticulosis of colon   . Endometrial hyperplasia 01/1996   fibroids, adenomyosis  . Fall 02/2017  . Frequent UTI   . History of colon polyps   . Hypertension   . Insomnia   . Osteopenia 12/08   hip  . Pre-diabetes   . Spinal stenosis 2001  . Vitamin D deficiency    Past Surgical History:  Past Surgical History:  Procedure Laterality Date  . BILATERAL SALPINGOOPHORECTOMY  12/97  . BREAST LUMPECTOMY Right 03/2003  . CARPAL TUNNEL RELEASE    . CERVICAL FUSION    . CERVICAL LAMINECTOMY  1975   x4  . CHOLECYSTECTOMY  11/00   lap chole  . HYSTEROSCOPY  10/97   D&C, postmenopausal bleeding  . LUMBAR LAMINECTOMY  10/06   with spinal stenosis  . manipulation of hip for dislocation    . ROTATOR CUFF REPAIR Right 11/96  . TONSILLECTOMY AND ADENOIDECTOMY    . TOTAL ABDOMINAL HYSTERECTOMY  12/97   endo hyperplasia, fibroids, adenomyosis  . TOTAL HIP ARTHROPLASTY Right 8/00  . TOTAL HIP REVISION Right 07/10/2015   Procedure: RIGHT ACETABULAR VS TOTAL HIP REVISION;  Surgeon: Gaynelle Arabian, MD;  Location: [facility];  Service: Orthopedics;  Laterality: Right;  . TOTAL KNEE ARTHROPLASTY Left 06/2003  . TOTAL KNEE ARTHROPLASTY Right 10/06  . TUBAL LIGATION Bilateral 1968   HPI:  Pt is an 84 yo female admitted from SNF with productive cough and difficulty breathing, initially requiring BiPAP on admission. Pt had prior swallow evaluations in June 2018 (suspected esophageal component, regular diet/thin liquids  recommended) and February 2019 (MBS completed recommending regular diet, thin liquids). Esophagram 07/2016: Nonspecific esophageal motility disorder with tertiary contractions in the mid and distal esophagus. PMH includes COPD/asthma, bipolar, recurrent UTI, HTN, TTY 2 DM, breast cancer + lumpectomy, cervical fusion, recurrent hospitalizations-last one 2/14-2/17 2/2 acute hypoxic respiratory failure + bilateral PE + cor pulmonale   Assessment / Plan / Recommendation Clinical Impression  Pt initially appeared to tolerate sips of water well, but after consuming solids she had consistent, immediate coughing with additional boluses of thin liquid. No coughing was noted with SLP intervention for thickening to a nectar consistency. Subjectively she describes improved tolerance with warmer liquids, alternating solids/liquids, and eating softer foods. Given the above, as well as recent MBS that showed adequate oropharyngeal function for age, suspect that pt's symptoms are primarily esophageal; however, given the amount of coughing observed with thin liquids, secondary pharyngeal components cannot be excluded. Recommend Dys 2 diet and nectar thick liquids for today pending completion of MBS (likely on next date).  SLP Visit Diagnosis: Dysphagia, unspecified (R13.10)    Aspiration Risk  Mild aspiration risk    Diet Recommendation Dysphagia 2 (Fine chop);Nectar-thick liquid   Liquid Administration via: Cup;No straw Medication Administration: Crushed with puree Supervision: Patient able to self feed;Full supervision/cueing for compensatory strategies Compensations: Minimize environmental distractions;Slow rate;Small sips/bites;Follow solids with liquid Postural Changes: Seated upright at 90 degrees;Remain upright for at least 30 minutes after po  intake    Other  Recommendations Recommended Consults: Consider esophageal assessment Oral Care Recommendations: Oral care BID Other Recommendations: Order thickener  from pharmacy;Prohibited food (jello, ice cream, thin soups);Remove water pitcher   Follow up Recommendations        Frequency and Duration            Prognosis Prognosis for Safe Diet Advancement: Fair Barriers to Reach Goals: Other (Comment)(suspect primary esophageal deficits)      Swallow Study   General HPI: Pt is an 84 yo female admitted from SNF with productive cough and difficulty breathing, initially requiring BiPAP on admission. Pt had prior swallow evaluations in June 2018 (suspected esophageal component, regular diet/thin liquids recommended) and February 2019 (MBS completed recommending regular diet, thin liquids). Esophagram 07/2016: Nonspecific esophageal motility disorder with tertiary contractions in the mid and distal esophagus. PMH includes COPD/asthma, bipolar, recurrent UTI, HTN, TTY 2 DM, breast cancer + lumpectomy, cervical fusion, recurrent hospitalizations-last one 2/14-2/17 2/2 acute hypoxic respiratory failure + bilateral PE + cor pulmonale Type of Study: Bedside Swallow Evaluation Previous Swallow Assessment: see HPI Diet Prior to this Study: Thin liquids Temperature Spikes Noted: No Respiratory Status: Nasal cannula History of Recent Intubation: No Behavior/Cognition: Alert;Cooperative;Requires cueing Oral Cavity Assessment: Within Functional Limits Oral Care Completed by SLP: No Oral Cavity - Dentition: Adequate natural dentition Vision: Functional for self-feeding Self-Feeding Abilities: Able to feed self Patient Positioning: Upright in bed Baseline Vocal Quality: Normal Volitional Cough: Strong;Other (Comment)(guarded due to reported pain with coughing) Volitional Swallow: Able to elicit    Oral/Motor/Sensory Function Overall Oral Motor/Sensory Function: Within functional limits   Ice Chips Ice chips: Not tested   Thin Liquid Thin Liquid: Impaired Presentation: Cup;Self Fed Pharyngeal  Phase Impairments: Cough - Immediate    Nectar Thick Nectar  Thick Liquid: Within functional limits Presentation: Cup;Self Fed   Honey Thick Honey Thick Liquid: Not tested   Puree Puree: Within functional limits Presentation: Self Fed;Spoon   Solid   GO   Solid: Impaired Presentation: Self Fed Oral Phase Impairments: Impaired mastication Oral Phase Functional Implications: Oral residue        Germain Osgood 06/13/2017,10:38 AM   Germain Osgood, M.A. CCC-SLP [phone]

## 2017-06-13 NOTE — Progress Notes (Signed)
Patient is currently on room air with sats of 97%. Patient is in no distress and all vitals are stable. BIPAP is in room but is not needed at this time. Patient also states she does not want to wear it at this time. Will continue to monitor.

## 2017-06-13 NOTE — Plan of Care (Signed)
Discussed plan of care with patient.  Explained pt can only have clear liquids.  Provided apple juice and monitored pt as she drank.  Tolerated fluids.  Coughed when drinking too fast.

## 2017-06-13 NOTE — Progress Notes (Addendum)
Pharmacy Antibiotic Note  [name] is an 84 y.o. female admitted on 06/11/2017 with PNA, has been treated with ceftriaxone and azithromycin. Pharmacy now consulted to switch to Zosyn due to concern for aspiration. On admit, WBC 11.7 and tmax 101. Now WBC WNL, currently AF. Scr stable < 1.  Plan: Zosyn 3.375g IV q8h (4 hour infusion). F/u clinical status, C&S, renal function, de-escalation, LOT  Height: 5\' 8"  (172.7 cm) Weight: 205 lb 14.6 oz (93.4 kg) IBW/kg (Calculated) : 63.9  Temp (24hrs), Avg:98.4 F (36.9 C), Min:98.1 F (36.7 C), Max:98.8 F (37.1 C)  Recent Labs  Lab 06/11/17 0856 06/11/17 0907 06/12/17 0220 06/13/17 0333  WBC 11.7*  --  6.1 7.8  CREATININE 0.49  --  0.43*  --   LATICACIDVEN  --  1.44  --   --     Estimated Creatinine Clearance: 61.4 mL/min (A) (by C-G formula based on SCr of 0.43 mg/dL (L)).    Antibiotics this admission: CTX 4/26 >> 4/28 Azitho 4/26 >> 4/28 Zosyn 4/28 >>  Microbiology: 4/26 BCx: GPC 2/4 (BCID MR staph species - likely contaminant) 4/26 MRSA PCR: positive 4/26 flu panel: neg  Thank you for allowing pharmacy to be a part of this patient's care.  [name], PharmD PGY1 Pharmacy Resident Pager: [phone] 06/13/2017 11:11 AM

## 2017-06-14 ENCOUNTER — Inpatient Hospital Stay (HOSPITAL_COMMUNITY): Payer: Medicare Other

## 2017-06-14 LAB — CBC
HCT: 35.6 % — ABNORMAL LOW (ref 36.0–46.0)
Hemoglobin: 11.7 g/dL — ABNORMAL LOW (ref 12.0–15.0)
MCH: 31.5 pg (ref 26.0–34.0)
MCHC: 32.9 g/dL (ref 30.0–36.0)
MCV: 95.7 fL (ref 78.0–100.0)
Platelets: 225 10*3/uL (ref 150–400)
RBC: 3.72 MIL/uL — AB (ref 3.87–5.11)
RDW: 14.5 % (ref 11.5–15.5)
WBC: 6.8 10*3/uL (ref 4.0–10.5)

## 2017-06-14 LAB — HEPARIN LEVEL (UNFRACTIONATED): Heparin Unfractionated: >2.2 IU/mL — ABNORMAL HIGH (ref 0.30–0.70)

## 2017-06-14 LAB — APTT: aPTT: 30 seconds (ref 24–36)

## 2017-06-14 LAB — GLUCOSE, CAPILLARY
GLUCOSE-CAPILLARY: 135 mg/dL — AB (ref 65–99)
GLUCOSE-CAPILLARY: 156 mg/dL — AB (ref 65–99)

## 2017-06-14 MED ORDER — BUPROPION HCL ER (XL) 300 MG PO TB24
300.0000 mg | ORAL_TABLET | Freq: Every day | ORAL | Status: DC
  Administered 2017-06-14 – 2017-06-15 (×2): 300 mg via ORAL
  Filled 2017-06-14 (×2): qty 1

## 2017-06-14 MED ORDER — AMOXICILLIN-POT CLAVULANATE 875-125 MG PO TABS
1.0000 | ORAL_TABLET | Freq: Two times a day (BID) | ORAL | Status: DC
  Administered 2017-06-14 – 2017-06-15 (×3): 1 via ORAL
  Filled 2017-06-14 (×3): qty 1

## 2017-06-14 MED ORDER — MUPIROCIN 2 % EX OINT
1.0000 "application " | TOPICAL_OINTMENT | Freq: Two times a day (BID) | CUTANEOUS | Status: DC
  Administered 2017-06-14 – 2017-06-15 (×2): 1 via NASAL
  Filled 2017-06-14 (×2): qty 22

## 2017-06-14 MED ORDER — STARCH (THICKENING) PO POWD
ORAL | Status: DC | PRN
  Filled 2017-06-14: qty 227

## 2017-06-14 MED ORDER — IBUPROFEN 200 MG PO TABS
400.0000 mg | ORAL_TABLET | Freq: Four times a day (QID) | ORAL | Status: DC | PRN
  Administered 2017-06-15: 400 mg via ORAL
  Filled 2017-06-14: qty 2

## 2017-06-14 MED ORDER — PREDNISONE 20 MG PO TABS
20.0000 mg | ORAL_TABLET | Freq: Every day | ORAL | Status: DC
  Administered 2017-06-15: 20 mg via ORAL
  Filled 2017-06-14: qty 1

## 2017-06-14 MED ORDER — CHLORHEXIDINE GLUCONATE CLOTH 2 % EX PADS
6.0000 | MEDICATED_PAD | Freq: Every day | CUTANEOUS | Status: DC
  Administered 2017-06-15: 6 via TOPICAL

## 2017-06-14 NOTE — Progress Notes (Signed)
Chaplain stopped by but Pt was not available please page chaplain when Pt comes by form CT.  [phone]

## 2017-06-14 NOTE — Progress Notes (Signed)
Modified Barium Swallow Progress Note  Patient Details  Name: [name] MRN: [id number] Date of Birth: Sep 09, 1930  Today's Date: 06/14/2017  Modified Barium Swallow completed.  Full report located under Chart Review in the Imaging Section.  Brief recommendations include the following:  Clinical Impression  Pt presents with a mild oropharyngeal dysphagia characterized by decreased bolus cohesion in the oral cavity and impaired timing for coordination of swallowing and airway protection. Of note, pt subjectively verbalizes difficulty with managing solid textures, requesting liquid washes, reporting fatigue, and not wanting to take in much, although mastication and oral clearance were minimally impaired during this test. Pt has consistent penetration with thin liquids and nectar thick liquids by straw. Although it is trace in volume and remains above the vocal folds, she cannot clear it with a cued cough. Pt attributes this to guarding from the "stitch" in her side that hurts if she tries to cough harder. A chin tuck does not significantly change function. Pt had only one instance of aspiration that occurred with multiple quick, consecutive cup sips of nectar thick liquids, which did clear with a cued cough. In light of pt's recurring respiratory issues, would favor a more conservative approach while pt is acutely compromised. Would continue with Dys 2 diet and nectar thick liquids by cup, monitoring for small, single sips.  Pt may be able to progress as her cough becomes more efficient to protect her airway.    Swallow Evaluation Recommendations       SLP Diet Recommendations: Dysphagia 2 (Fine chop) solids;Nectar thick liquid   Liquid Administration via: Cup;No straw   Medication Administration: Whole meds with puree   Supervision: Patient able to self feed;Full supervision/cueing for compensatory strategies   Compensations: Minimize environmental distractions;Slow rate;Small  sips/bites;Follow solids with liquid;Clear throat intermittently   Postural Changes: Seated upright at 90 degrees   Oral Care Recommendations: Oral care BID   Other Recommendations: Order thickener from pharmacy;Prohibited food (jello, ice cream, thin soups);Remove water pitcher    Germain Osgood 06/14/2017,12:05 PM   Germain Osgood, M.A. CCC-SLP [phone]

## 2017-06-14 NOTE — Plan of Care (Signed)
Refuses to get out of bed so is why activity goal is not progressing; however, is progressing in bed mobility.  Pt does not turn every two hours as she should.  Pt progressing toward discharge and may be discharged as early as 06/15/2017.

## 2017-06-14 NOTE — Progress Notes (Signed)
Hospitalist progress note   [name]  HFW:[id number] DOB: 27-Jul-1930 DOA: 06/11/2017 PCP: Reynold Bowen, MD  Specialists:    Brief Narrative:  43 COPD/asthma, bipolar, recurrent UTI, HTN, TTY 2 DM, breast cancer + lumpectomy, recurrent hospitalizations-last one 2/14-2/17 2/2 acute hypoxic respiratory failure + bilateral PE + cor pulmonale Admit from skilled nursing 5-day history productive cough brown sputum difficulty breathing-in the ED given albuterol Atrovent Solu-Medrol-ultimately placed on BiPAP and as was agitated received fentanyl and Ativan She is a partial code according to son  Assessment & Plan:   Acute respiratory failure-bronchitis versus AE COPD-continue BiPAP, repeat ABG this a.m. last ABG 4/26 PCO2 60 on admission pH 7.2 continue IV steroids 60--transitioned on 4/29 to p.o. prednisone 20 daily ?component cor pulmonale  Aspiration Pneumonia-continue CAP coverage azithromycin, ceftriaxone-Flu panel [-]-transitioned to oral Augmentin on 4/29 Needs repeat chest x-ray in about 1 month to denote clearing versus not  Cor pulmonale?  Decompensated right-sided heart failure-keep normal saline locked and repeat x-ray to ensure no fluid in addition to aspiration  A1c 5.6-monitor sugars-blood sugar trends are in the 130s and patient does not like fingersticks so we will only check with a.m. labs  Bipolar-probably component of encephalopathy secondary to polypharmacy-Home meds include Robaxin 500, Remeron 7.5 at bedtime, Norco, Neurontin 300 3 times daily, Klonopin 1 mg at bedtime, Lexapro 10 daily, Wellbutrin 300 a.m. -We will reimplement only what we feel is absolutely indicated   Acute encephalopathy hypoxia, CO2 retention, febrile illness all playing a role Son at the bedside on 4/27 reports a history of inappropriate use of opiates and other medications which may be a component of her hypoxia I have discontinued as above various meds  Bilateral PE recently 2/14-position  from IV heparin to Eliquis currently on 5 bid  Dysphagia causing aspiration-speech therapy cleared patient for dysphagia 2 diet.  Son mentions that patient is impulsive and eats quickly which may be the cause Speech therapy to reevaluate the patient as grainy type of diet caused the patient to be choked this am  History of frequent falls-fall at dinner table a couple of days ago.  ?  Melanoma to right leg-may need skin input as an outpatient once she gets better   DVT prophylaxis: heparin  Code Status:   Partial   family Communication:   Discussed with son on phone 4/29 again and fully explained  Disposition Plan: SDU/ICU   Consultants:   Pulm  Procedures:   none  Antimicrobials:   Ceftriaxone  Azithromycin   Subjective:  - Fever, - chills, - sputum, - diarrhea awake alert in no distress tolerating diet but was impulsive and felt choked earlier this morning Much more coherent   Objective: Vitals:   06/13/17 2008 06/13/17 2346 06/14/17 0123 06/14/17 0856  BP:  (!) 172/120 (!) 152/97   Pulse:  98 (!) 59   Resp:  18 20   Temp:      TempSrc:      SpO2: 97% 93% 93% (!) 86%  Weight:      Height:        Intake/Output Summary (Last 24 hours) at 06/14/2017 1013 Last data filed at 06/13/2017 1315 Gross per 24 hour  Intake 200 ml  Output -  Net 200 ml   Filed Weights   06/11/17 0834 06/12/17 0404 06/13/17 0457  Weight: 88 kg (194 lb) 91.5 kg (201 lb 11.5 oz) 93.4 kg (205 lb 14.6 oz)    Examination:  Awake alert pleasant oriented EOMI  Arcus senilis NCAT Neck is thick however no JVD bruit S1-S2 sinus rhythm on monitors Abdomen soft nontender nondistended no rebound no guarding Chest has mild wheeze posterolaterally with no egophony no fremitus and is improved from prior Lower extremities look a little darker than yesterday she does have an area over the right shin that is raised and macular like and may need further attention melanoma to right foot  Data  Reviewed: I have personally reviewed following labs and imaging studies  CBC: Recent Labs  Lab 06/11/17 0856 06/12/17 0220 06/13/17 0333 06/14/17 0146  WBC 11.7* 6.1 7.8 6.8  NEUTROABS 8.6*  --   --   --   HGB 13.9 11.4* 11.2* 11.7*  HCT 42.1 35.3* 34.8* 35.6*  MCV 97.5 97.0 95.6 95.7  PLT 235 186 216 008   Basic Metabolic Panel: Recent Labs  Lab 06/11/17 0856 06/12/17 0220 06/13/17 0932  NA 137 141 141  K 4.0 3.1* 3.8  CL 98* 106 103  CO2 24 27 24   GLUCOSE 100* 151* 186*  BUN 8 9 15   CREATININE 0.49 0.43* 0.65  CALCIUM 9.3 9.2 8.8*  PHOS  --   --  2.4*   GFR: Estimated Creatinine Clearance: 61.4 mL/min (by C-G formula based on SCr of 0.65 mg/dL). Liver Function Tests: Recent Labs  Lab 06/11/17 0856 06/13/17 0932  AST 36  --   ALT 30  --   ALKPHOS 95  --   BILITOT 1.1  --   PROT 6.6  --   ALBUMIN 3.1* 2.4*   No results for input(s): LIPASE, AMYLASE in the last 168 hours. No results for input(s): AMMONIA in the last 168 hours. Coagulation Profile: No results for input(s): INR, PROTIME in the last 168 hours. Cardiac Enzymes:  Radiology Studies: Reviewed images personally in health database   Scheduled Meds: . amoxicillin-clavulanate  1 tablet Oral Q12H  . apixaban  5 mg Oral BID  . aspirin EC  325 mg Oral Daily  . budesonide  0.5 mg Nebulization BID  . buPROPion  300 mg Oral Daily  . escitalopram  10 mg Oral Daily  . fluticasone  1 spray Each Nare BID  . ipratropium-albuterol  3 mL Nebulization TID  . metoprolol tartrate  12.5 mg Oral BID  . mirabegron ER  50 mg Oral Daily  . polyvinyl alcohol  1 drop Both Eyes BID  . potassium chloride  40 mEq Oral Daily  . [START ON 06/15/2017] predniSONE  20 mg Oral QAC breakfast  . sodium chloride flush  3 mL Intravenous Q12H   Continuous Infusions:    LOS: 3 days    Time spent: [place], MD [facility] (P) [phone]  If 7PM-7AM, please contact night-coverage [email or web address] Password  [facility] 06/14/2017, 10:13 AM

## 2017-06-15 LAB — CBC WITH DIFFERENTIAL/PLATELET
Basophils Absolute: 0 10*3/uL (ref 0.0–0.1)
Basophils Relative: 0 %
EOS ABS: 0 10*3/uL (ref 0.0–0.7)
Eosinophils Relative: 0 %
HEMATOCRIT: 35.1 % — AB (ref 36.0–46.0)
HEMOGLOBIN: 11.4 g/dL — AB (ref 12.0–15.0)
LYMPHS ABS: 0.9 10*3/uL (ref 0.7–4.0)
LYMPHS PCT: 10 %
MCH: 31 pg (ref 26.0–34.0)
MCHC: 32.5 g/dL (ref 30.0–36.0)
MCV: 95.4 fL (ref 78.0–100.0)
MONOS PCT: 8 %
Monocytes Absolute: 0.7 10*3/uL (ref 0.1–1.0)
NEUTROS ABS: 7.1 10*3/uL (ref 1.7–7.7)
NEUTROS PCT: 82 %
Platelets: 188 10*3/uL (ref 150–400)
RBC: 3.68 MIL/uL — AB (ref 3.87–5.11)
RDW: 14.6 % (ref 11.5–15.5)
WBC: 8.7 10*3/uL (ref 4.0–10.5)

## 2017-06-15 LAB — RENAL FUNCTION PANEL
ANION GAP: 8 (ref 5–15)
Albumin: 2.4 g/dL — ABNORMAL LOW (ref 3.5–5.0)
BUN: 12 mg/dL (ref 6–20)
CHLORIDE: 104 mmol/L (ref 101–111)
CO2: 29 mmol/L (ref 22–32)
Calcium: 9.1 mg/dL (ref 8.9–10.3)
Creatinine, Ser: 0.52 mg/dL (ref 0.44–1.00)
GFR calc non Af Amer: >60 mL/min (ref >60)
GLUCOSE: 136 mg/dL — AB (ref 65–99)
Phosphorus: 3.1 mg/dL (ref 2.5–4.6)
Potassium: 4.6 mmol/L (ref 3.5–5.1)
Sodium: 141 mmol/L (ref 135–145)

## 2017-06-15 LAB — CULTURE, BLOOD (ROUTINE X 2): Special Requests: ADEQUATE

## 2017-06-15 IMAGING — DX DG CHEST 2V
2 series · 2 of 2 positions shown · non-contrast
Comparison: 02/11/2015

CLINICAL DATA: Preoperative right hip replacement.

EXAM:
CHEST  2 VIEW

[chest lat]
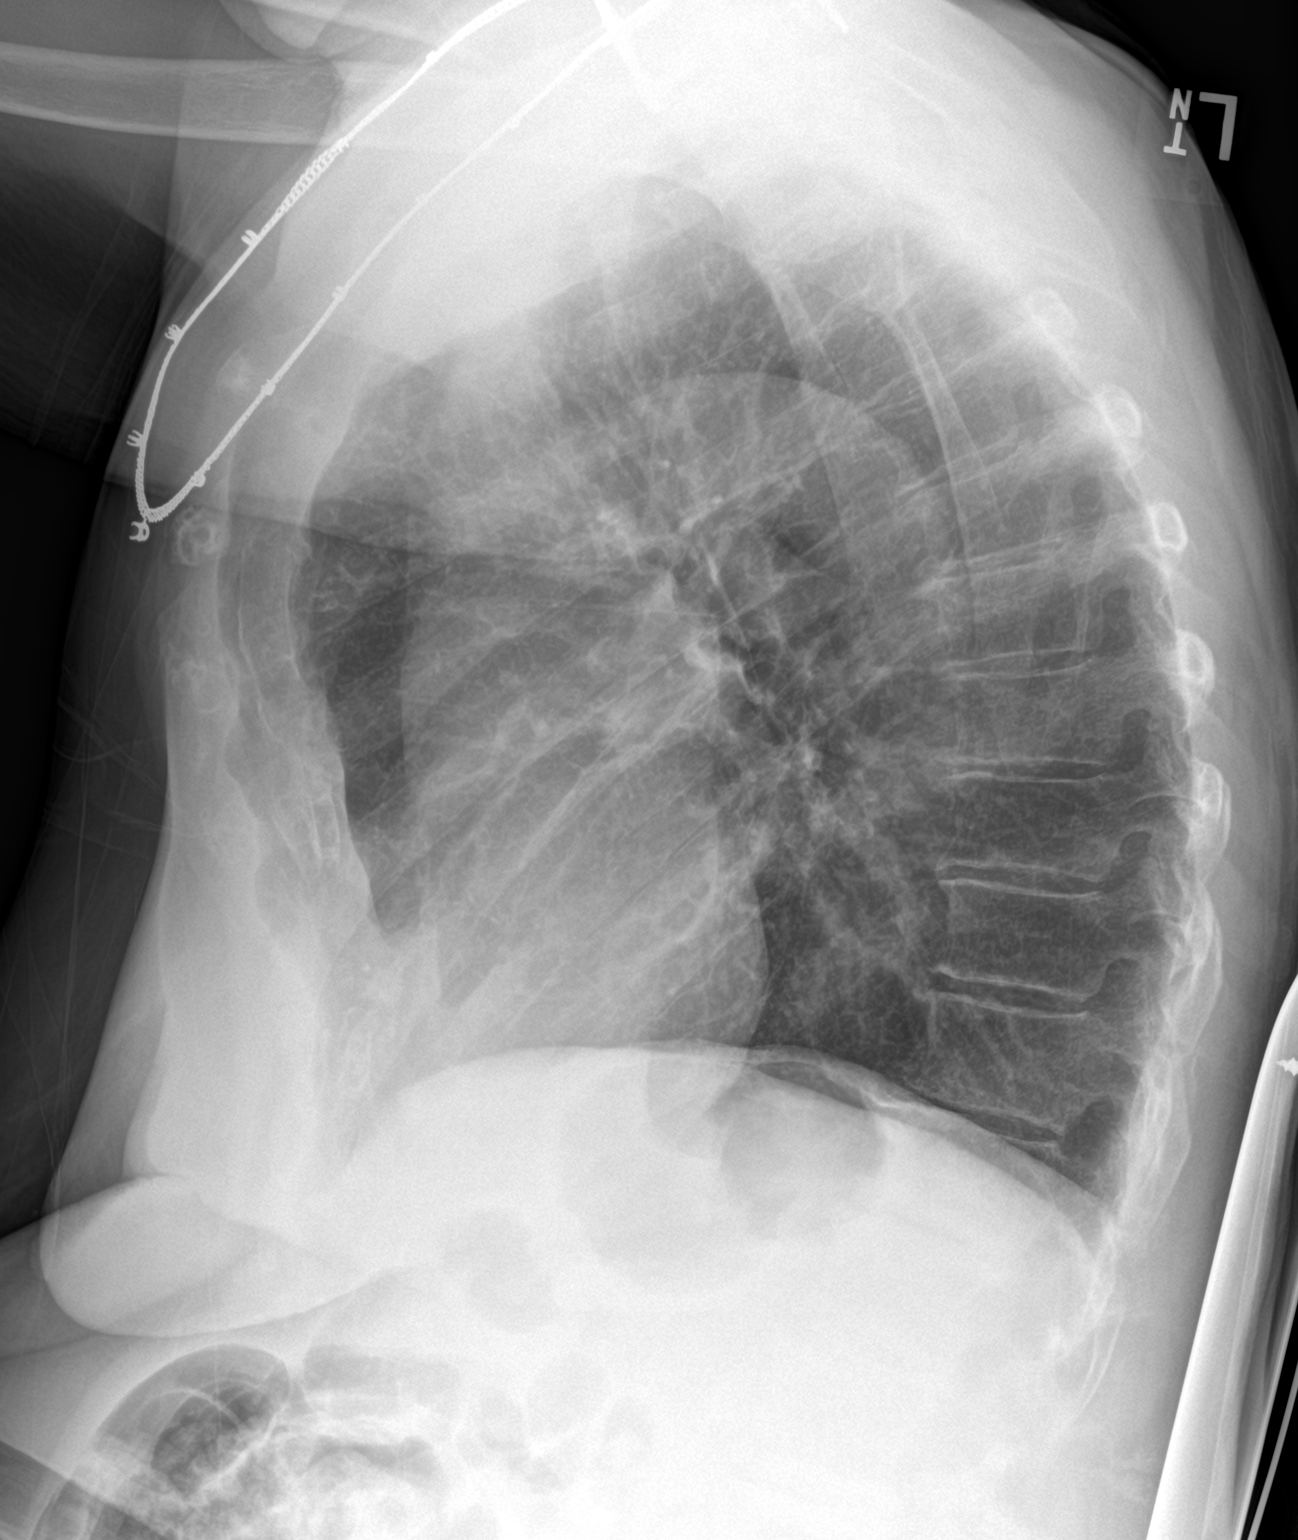

[chest ap]
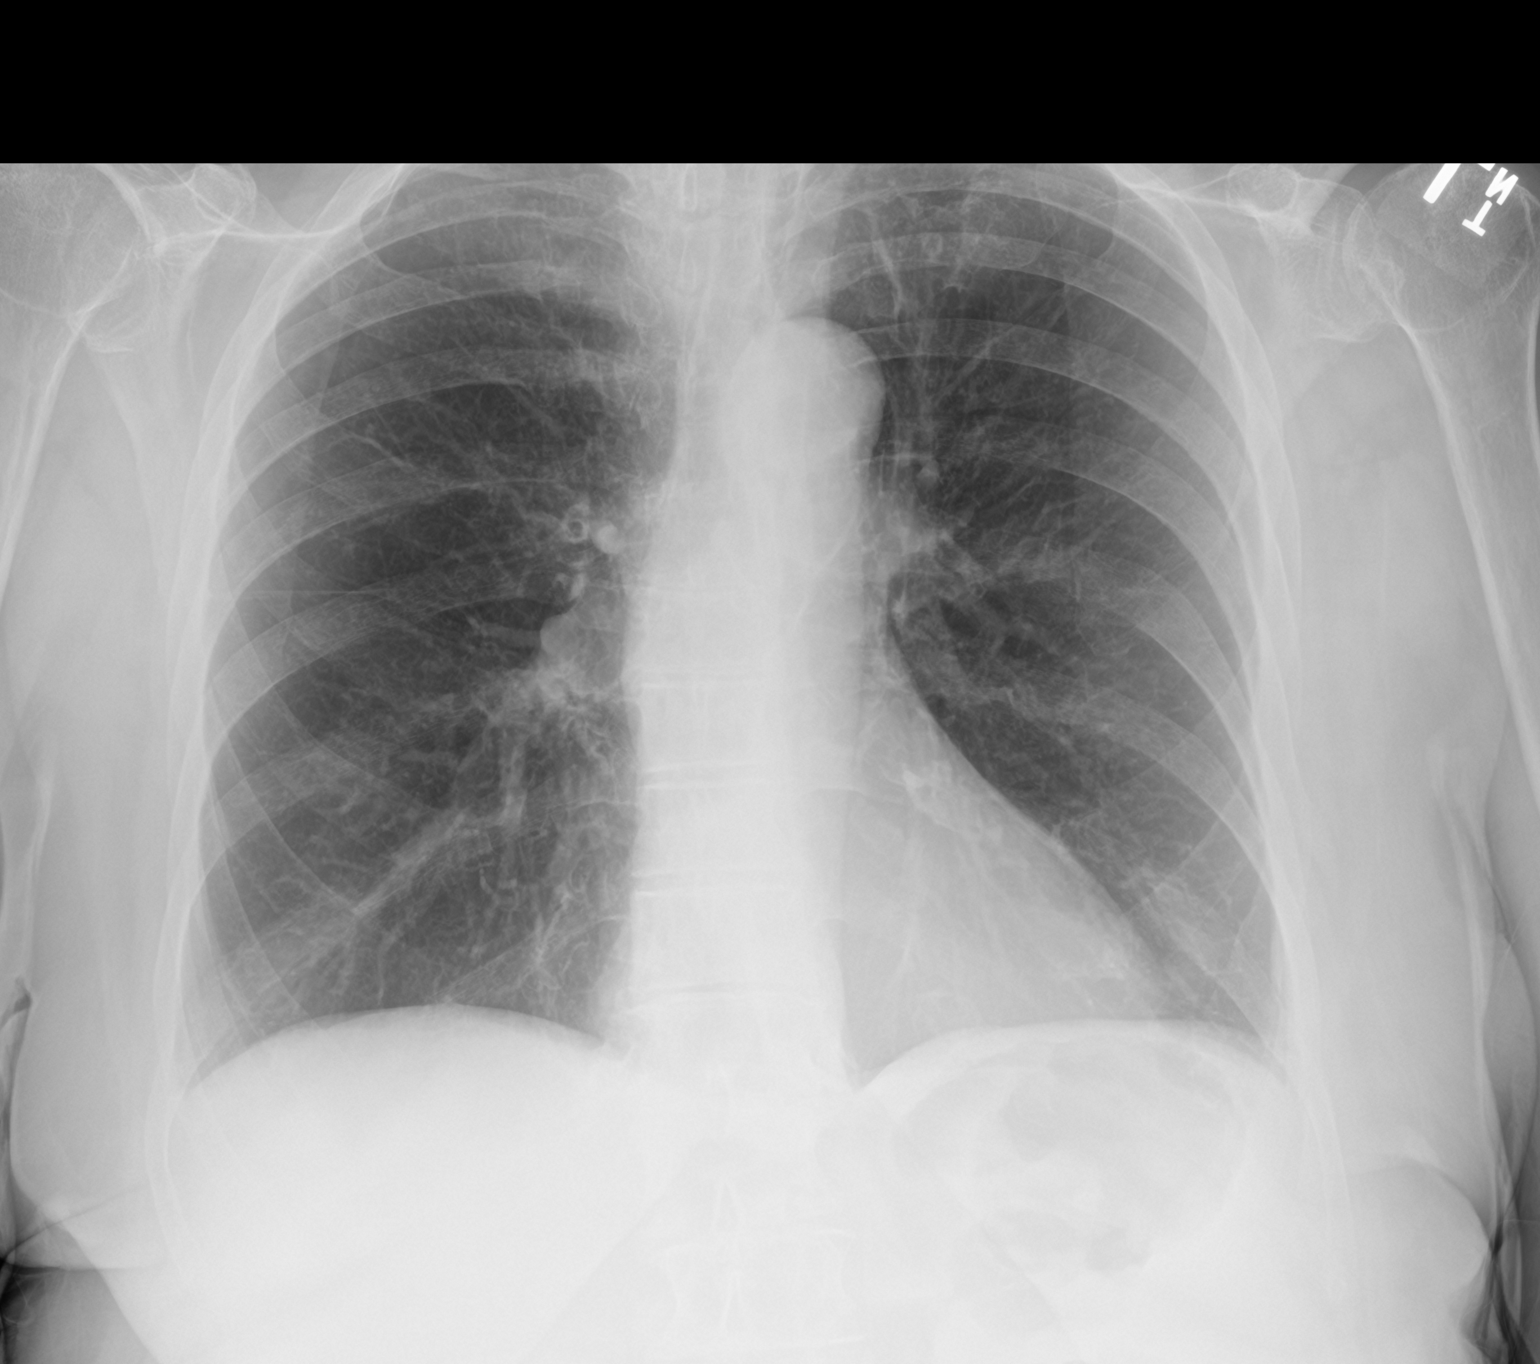

[2 of 2 positions shown; findings below may reference images not displayed]

FINDINGS: Cardiomediastinal silhouette is normal. Mediastinal contours appear
intact.

There is no evidence of focal airspace consolidation, pleural
effusion or pneumothorax.

Osseous structures are without acute abnormality. Soft tissues are
grossly normal. Partially visualized lower cervical spine fusion
hardware.
IMPRESSION: No active cardiopulmonary disease.

## 2017-06-15 MED ORDER — STARCH (THICKENING) PO POWD: ORAL | 0 refills | Status: AC

## 2017-06-15 MED ORDER — PREDNISONE 20 MG PO TABS: 40.0000 mg | ORAL_TABLET | Freq: Every day | ORAL | 0 refills | Status: AC

## 2017-06-15 MED ORDER — AMOXICILLIN-POT CLAVULANATE 875-125 MG PO TABS: 1.0000 | ORAL_TABLET | Freq: Two times a day (BID) | ORAL | 0 refills | Status: DC

## 2017-06-15 MED ORDER — IBUPROFEN 400 MG PO TABS: 400.0000 mg | ORAL_TABLET | Freq: Four times a day (QID) | ORAL | 0 refills | Status: DC | PRN

## 2017-06-15 NOTE — Progress Notes (Signed)
Patient discharged to [facility]. Report called to nurse. Patient is alert and oriented with VS stable at time of discharge.

## 2017-06-15 NOTE — Progress Notes (Signed)
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 CXR negative. Worked up for acute on chronic hypoxic respiratory failure likely precipitated by acute purulent bronchitis and COPD exacerbation; also with sepsis. PMH of asthma/COPD, anxiety, depression, recurrent UTI, HTN, DM2, breast CA. Of note, pt has had multiple recent hospitalizations, most recently 04/01/2017-04/04/2017 for acute respiratory failure due to bilateral PE with cor pulmonale, at which time she was discharged on Eliquis.   PT Comments    Pt declining seated or OOB mobility this session secondary to significant fatigue. Willing to participate in supine LE therex with max encouragement. Easily fatigued during this, requiring multiple rest breaks with DOE 2/4; SpO2 90% on RA. Pt incontinent of urine/bowel during session, requiring modA to roll R/L and dependent for pericare. Continue to recommend SNF-level therapies.    Follow Up Recommendations  SNF     Equipment Recommendations  None recommended by PT    Recommendations for Other Services       Precautions / Restrictions Precautions Precautions: Fall Restrictions Weight Bearing Restrictions: No    Mobility  Bed Mobility Overal bed mobility: Needs Assistance Bed Mobility: Rolling Rolling: Mod assist         General bed mobility comments: ModA to roll R/L with use of bed rail secondary to bowel/bladder incontinence pt was not aware of; dependent for pericare. MaxA+2 to scoot up in bed for repositioning  Transfers                 General transfer comment: Pt declining seated or OOB mobility secondary to fatigue; agreeable to supine therex.  Ambulation/Gait                 Stairs              Wheelchair Mobility    Modified Rankin (Stroke Patients Only)       Balance                                            Cognition Arousal/Alertness: Awake/alert Behavior During Therapy: WFL for tasks assessed/performed Overall Cognitive Status: No family/caregiver present to determine baseline cognitive functioning Area of Impairment: Memory;Following commands;Problem solving                     Memory: Decreased short-term memory Following Commands: Follows multi-step commands inconsistently     Problem Solving: Requires verbal cues        Exercises General Exercises - Lower Extremity Ankle Circles/Pumps: AROM;Both;10 reps;Supine Heel Slides: AROM;Both;20 reps;Supine Hip ABduction/ADduction: AROM;Both;20 reps;Supine Straight Leg Raises: AROM;Both;20 reps;Supine    General Comments        Pertinent Vitals/Pain Pain Assessment: Faces Faces Pain Scale: Hurts little more Pain Location: Abdomen when coughing Pain Descriptors / Indicators: Grimacing;Guarding    Home Living                      Prior Function            PT Goals (current goals can now be found in the care plan section) Acute Rehab PT Goals Patient Stated Goal: get stronger PT Goal Formulation: With patient Time For  Goal Achievement: 06/27/17 Potential to Achieve Goals: Fair Progress towards PT goals: Progressing toward goals    Frequency    Min 2X/week      PT Plan Current plan remains appropriate    Co-evaluation              AM-PAC PT "6 Clicks" Daily Activity  Outcome Measure  Difficulty turning over in bed (including adjusting bedclothes, sheets and blankets)?: Unable Difficulty moving from lying on back to sitting on the side of the bed? : Unable Difficulty sitting down on and standing up from a chair with arms (e.g., wheelchair, bedside commode, etc,.)?: Unable Help needed moving to and from a bed to chair (including a  wheelchair)?: A Lot Help needed walking in hospital room?: Total Help needed climbing 3-5 steps with a railing? : Total 6 Click Score: 7    End of Session Equipment Utilized During Treatment: Gait belt Activity Tolerance: Patient limited by fatigue Patient left: in bed;with call bell/phone within reach;Other (comment)(with SLP present) Nurse Communication: Mobility status PT Visit Diagnosis: Other abnormalities of gait and mobility (R26.89);Muscle weakness (generalized) (M62.81)     Time: 5732-2025 PT Time Calculation (min) (ACUTE ONLY): 39 min  Charges:  $Therapeutic Exercise: 23-37 mins $Therapeutic Activity: 8-22 mins                    G Codes:      Mabeline Caras, PT, DPT Acute Rehab Services  Pager: [place] 06/15/2017, 2:48 PM

## 2017-06-15 NOTE — Progress Notes (Signed)
Patient will discharge to [facility] Anticipated discharge date: 4/30 Family notified:pt son Transportation by [facility]- scheduled for 3pm Report #: [phone] [id number]  CSW signing off.  Jorge Ny, LCSW Clinical Social Worker [phone]

## 2017-06-15 NOTE — Progress Notes (Signed)
  Speech Language Pathology Treatment: Dysphagia  Patient Details Name: [name] MRN: [id number] DOB: 10/06/1930 Today's Date: 06/15/2017 Time: 4656-8127 SLP Time Calculation (min) (ACUTE ONLY): 11 min  Assessment / Plan / Recommendation Clinical Impression  Pt can verbalize swallowing strategies recommended from MBS on previous date with Min cues, and implements with Min cues for smaller sips and energy conservation. She took a large sip x1, immediately stating that she got too much volume, with a delayed cough that followed. No other overt signs concerning for aspiration were noted. Although pt can say what her strategies are, she has only partial awareness of her overall swallow function, stating that her son and MD keep telling her she is aspirating, but she does not believe them. SLP reviewed results from [facility] on previous date including penetration that cannot be cleared and risk for aspiration with more impulsive intake, reiterating that this is the rationale for her diet modifications and swallowing precautions. Recommend that pt continue on current diet (Dys 2, nectar thick liquids by cup) with additional f/u at SNF.   HPI HPI: Pt is an 84 yo female admitted from SNF with productive cough and difficulty breathing, initially requiring BiPAP on admission. Pt had prior swallow evaluations in June 2018 (suspected esophageal component, regular diet/thin liquids recommended) and February 2019 (MBS completed recommending regular diet, thin liquids). Esophagram 07/2016: Nonspecific esophageal motility disorder with tertiary contractions in the mid and distal esophagus. PMH includes COPD/asthma, bipolar, recurrent UTI, HTN, TTY 2 DM, breast cancer + lumpectomy, cervical fusion, recurrent hospitalizations-last one 2/14-2/17 2/2 acute hypoxic respiratory failure + bilateral PE + cor pulmonale      SLP Plan  Continue with current plan of care       Recommendations  Diet recommendations:  Nectar-thick liquid;Dysphagia 2 (fine chop) Liquids provided via: Cup;No straw Medication Administration: Whole meds with puree Supervision: Patient able to self feed;Full supervision/cueing for compensatory strategies Compensations: Minimize environmental distractions;Slow rate;Small sips/bites;Follow solids with liquid;Clear throat intermittently Postural Changes and/or Swallow Maneuvers: Seated upright 90 degrees                Oral Care Recommendations: Oral care BID Follow up Recommendations: Skilled Nursing facility SLP Visit Diagnosis: Dysphagia, oropharyngeal phase (R13.12) Plan: Continue with current plan of care       GO                Germain Osgood 06/15/2017, 3:15 PM  Germain Osgood, M.A. CCC-SLP [phone]

## 2017-06-15 NOTE — Care Management Important Message (Signed)
Important Message  Patient Details  Name: [name] MRN: [id number] Date of Birth: December 25, 1930   Medicare Important Message Given:  Yes    Zenon Mayo, RN 06/15/2017, 10:53 AM

## 2017-06-15 NOTE — Discharge Summary (Signed)
Physician Discharge Summary  [name] GHW:[id number] DOB: Aug 19, 1930 DOA: 06/11/2017  PCP: Reynold Bowen, MD  Admit date: 06/11/2017 Discharge date: 06/15/2017  Time spent: 45 minutes  Recommendations for Outpatient Follow-up:  1. Please note changes on MAR with regards to multiple sedating medications inclusive but not limited to opiates, trazodone, etc. etc.-I have only discontinued Lexapro and Wellbutrin 2. Would complete course of Augmentin for aspiration pneumonia and would recommend an x-ray in about 1 month 3. Please get complete metabolic panel in addition to CBC in about 1 week 4. Please use limited amounts of ibuprofen for overall body aches and pains as a second choice for more severe pain-Tylenol should be fine for normal aches and pains 5. Recommend thickened liquids and full supervision at the time of feeding 6.  patient should be seated at 45 to 60 degrees with meals and speech therapy should reevaluate the patient at facility on discharge 7. Please ensure that the patient has follow-up for his skin lesions-has a new skin lesion on the right shin area that may be benign but looks suspicious for melanoma  Discharge Diagnoses:  Principal Problem:   Acute on chronic respiratory failure with hypoxia ([place]) Active Problems:   Essential hypertension   Wheelchair bound   Sepsis ([place])   Depression with anxiety   COPD exacerbation ([place])   Discharge Condition: Improved  Diet recommendation: Dysphagia II nectar thick--do not give Jell-O ice cream thin soups and remove water pitcher from room  Filed Weights   06/12/17 0404 06/13/17 0457 06/15/17 0428  Weight: 91.5 kg (201 lb 11.5 oz) 93.4 kg (205 lb 14.6 oz) 94.2 kg (207 lb 10.8 oz)    History of present illness:  87 COPD/asthma, bipolar, recurrent UTI, HTN, TTY 2 DM, breast cancer + lumpectomy, recurrent hospitalizations-last one 2/14-2/17 2/2 acute hypoxic respiratory failure + bilateral PE + cor pulmonale Admit from  skilled nursing 5-day history productive cough brown sputum difficulty breathing-in the ED given albuterol Atrovent Solu-Medrol-ultimately placed on BiPAP and as was agitated received fentanyl and Ativan She is a partial code according to son    Hospital Course:    Acute respiratory failure-bronchitis versus AE COPD-continue BiPAP, repeat ABG this a.m. last ABG 4/26 PCO2 60 on admission pH 7.2  Patient did not require continued BiPAP and was discontinued off of oxygen over the course of 24 hours prior to discharge without further oxygen requirements  IV steroids 60--transitioned on 4/29 to p.o. prednisone 20 daily ultimately discontinued on discharge  Aspiration Pneumonia-continue CAP coverage azithromycin, ceftriaxone-Flu panel [-]-transitioned to oral Augmentin on 4/29 Needs repeat chest x-ray in about 1 month to denote clearing versus not  Cor pulmonale?  Decompensated right-sided heart failure-keep normal saline locked and repeat x-ray to ensure no fluid in addition to aspiration Did not seem to need Lasix and was not swollen Continue metoprolol 12.5 twice daily  A1c 5.6-monitor sugars-blood sugar trends are in the 130s  Likely impaired glucose tolerance secondary to steroid use-would recommend outpatient reassessment of the same  Bipolar-probably component of encephalopathy secondary to polypharmacy-Home meds include Robaxin 500, Remeron 7.5 at bedtime, Norco, Neurontin 300 3 times daily, Klonopin 1 mg at bedtime, Lexapro 10 daily, Wellbutrin 300 a.m. -We will reimplement only what we feel is absolutely indicated-see above discussion  Acute encephalopathy hypoxia, CO2 retention, febrile illness all playing a role Son at the bedside on 4/27 reports a history of inappropriate use of opiates and other medications which may be a component of her hypoxia I  have discontinued as above various meds  Bilateral PE recently 2/14-position from IV heparin to Eliquis currently on 5  bid  Dysphagia causing aspiration-speech therapy cleared patient for dysphagia 2 diet.  Son mentions that patient is impulsive and eats quickly which may be the cause Speech therapy evaluated patient and made recommendations as above  History of frequent falls-fall at dinner table a couple of days ago.  ?  Melanoma to right leg-may need skin input as an outpatient once she gets better Deferred to primary physician to set this up from facility    Procedures:  Chest x-rays none  Consultations:  None  Discharge Exam: Vitals:   06/14/17 2300 06/15/17 0721  BP: 138/72 (!) 178/97  Pulse: 67 82  Resp: (!) 24 18  Temp: 98.6 F (37 C) 98.4 F (36.9 C)  SpO2: 98% 95%    General: Awake alert pleasant coherent, arcus senilis no pallor Cardiovascular: S1-S2 no murmur telemetry reviewed shows occasional PVC Respiratory: Clinically clear no added sounds no fremitus no resonance no egophony Abdomen soft no rebound no guarding Right lower extremity has slightly irregular lesion raised on the shin with dark center No lower extremity edema No sacral decubitus  Discharge Instructions   Discharge Instructions    Diet - low sodium heart healthy   Complete by:  As directed    Increase activity slowly   Complete by:  As directed      Allergies as of 06/15/2017      Reactions   Oxycodone Hcl [oxycodone Hcl] Rash   Morphine Sulfate Other (See Comments)   REACTION: very anxious   Sulfamethoxazole Other (See Comments)   REACTION: unspecified      Medication List    STOP taking these medications   acetaminophen 325 MG tablet Commonly known as:  TYLENOL   clonazePAM 1 MG tablet Commonly known as:  KLONOPIN   gabapentin 300 MG capsule Commonly known as:  NEURONTIN   HYDROcodone-acetaminophen 5-325 MG tablet Commonly known as:  NORCO/VICODIN   methocarbamol 500 MG tablet Commonly known as:  ROBAXIN   mirtazapine 15 MG tablet Commonly known as:  REMERON   PULMICORT  0.5 MG/2ML nebulizer solution Generic drug:  budesonide     TAKE these medications   amoxicillin-clavulanate 875-125 MG tablet Commonly known as:  AUGMENTIN Take 1 tablet by mouth every 12 (twelve) hours.   apixaban 5 MG Tabs tablet Commonly known as:  ELIQUIS Take 1 tablet (5 mg total) by mouth 2 (two) times daily. What changed:  Another medication with the same name was removed. Continue taking this medication, and follow the directions you see here.   aspirin EC 325 MG tablet Take 325 mg by mouth daily.   benzonatate 200 MG capsule Commonly known as:  TESSALON Take 200 mg by mouth 3 (three) times daily as needed for cough.   buPROPion 300 MG 24 hr tablet Commonly known as:  WELLBUTRIN XL Take 1 tablet (300 mg total) by mouth every morning.   CALCIUM+D3 600-800 MG-UNIT Tabs Generic drug:  Calcium Carb-Cholecalciferol Take 1 tablet by mouth daily.   CEPACOL SORE THROAT 15-2.6 MG Lozg Generic drug:  Benzocaine-Menthol Use as directed 1 lozenge in the mouth or throat every 4 (four) hours as needed (for sore throat).   DULERA 200-5 MCG/ACT Aero Generic drug:  mometasone-formoterol Inhale 1 puff into the lungs 2 (two) times daily.   escitalopram 10 MG tablet Commonly known as:  LEXAPRO Take 1 tablet (10 mg total) by mouth daily.  fluticasone 50 MCG/ACT nasal spray Commonly known as:  FLONASE Place 1 spray into both nostrils 2 (two) times daily.   food thickener Powd Commonly known as:  THICK IT Use as needed for thickening fluids   guaiFENesin 600 MG 12 hr tablet Commonly known as:  MUCINEX Take 1 tablet (600 mg total) by mouth 2 (two) times daily.   ibuprofen 400 MG tablet Commonly known as:  ADVIL,MOTRIN Take 1 tablet (400 mg total) by mouth every 6 (six) hours as needed for moderate pain.   ipratropium-albuterol 0.5-2.5 (3) MG/3ML Soln Commonly known as:  DUONEB Take 3 mLs by nebulization every 6 (six) hours as needed. What changed:  reasons to take this    metoprolol tartrate 25 MG tablet Commonly known as:  LOPRESSOR Take 0.5 tablets (12.5 mg total) by mouth 2 (two) times daily.   montelukast 10 MG tablet Commonly known as:  SINGULAIR Take 10 mg by mouth at bedtime.   MYRBETRIQ 50 MG Tb24 tablet Generic drug:  mirabegron ER Take 50 mg by mouth daily.   ondansetron 4 MG tablet Commonly known as:  ZOFRAN Take 4 mg by mouth every 8 (eight) hours as needed for nausea or vomiting.   polyethylene glycol packet Commonly known as:  MIRALAX / GLYCOLAX Take 17 g by mouth daily as needed for mild constipation.   predniSONE 20 MG tablet Commonly known as:  DELTASONE Take 2 tablets (40 mg total) by mouth daily with breakfast for 5 days.   PROAIR HFA 108 (90 Base) MCG/ACT inhaler Generic drug:  albuterol Inhale 2 puffs into the lungs every 4 (four) hours as needed for shortness of breath. What changed:  Another medication with the same name was removed. Continue taking this medication, and follow the directions you see here.   albuterol 0.63 MG/3ML nebulizer solution Commonly known as:  ACCUNEB Take 3 mLs (0.63 mg total) by nebulization 4 (four) times daily. What changed:  Another medication with the same name was removed. Continue taking this medication, and follow the directions you see here.   Propylene Glycol-Glycerin 0.6-0.6 % Soln Commonly known as:  SOOTHE Apply 1 drop to eye 2 (two) times daily.   senna 8.6 MG Tabs tablet Commonly known as:  SENOKOT Take 1 tablet by mouth daily as needed for mild constipation.   sodium phosphate 7-19 GM/118ML Enem Place 1 enema rectally daily as needed for severe constipation.      Allergies  Allergen Reactions  . Oxycodone Hcl [Oxycodone Hcl] Rash  . Morphine Sulfate Other (See Comments)    REACTION: very anxious  . Sulfamethoxazole Other (See Comments)    REACTION: unspecified   Contact information for after-discharge care    Destination    [facility] .    Service:  Skilled Nursing Contact information: [place] [phone]               The results of significant diagnostics from this hospitalization (including imaging, microbiology, ancillary and laboratory) are listed below for reference.    Significant Diagnostic Studies: Portable Chest 1 View  Result Date: 06/12/2017 CLINICAL DATA:  Acute respiratory failure.  Hypoxia. EXAM: PORTABLE CHEST 1 VIEW COMPARISON:  06/11/2017 FINDINGS: Normal heart size. Interstitial prominence. There is airspace disease at the right apex and left base. No pneumothorax. Small left pleural effusion is not excluded. IMPRESSION: Right apical and left basilar airspace disease. Followup PA and lateral chest X-ray is recommended in 3-4 weeks following trial of  antibiotic therapy to ensure resolution and exclude underlying malignancy. Electronically Signed   By: Marybelle Killings M.D.   On: 06/12/2017 12:40   Dg Chest Port 1 View  Result Date: 06/11/2017 CLINICAL DATA:  Shortness of breath. EXAM: PORTABLE CHEST 1 VIEW COMPARISON:  CT chest and chest x-ray dated April 01, 2017. FINDINGS: The heart size and mediastinal contours are within normal limits. Normal pulmonary vascularity. Slightly coarsened interstitial markings are similar to prior study. Mild bibasilar atelectasis. No focal consolidation, pleural effusion, or pneumothorax. No acute osseous abnormality. IMPRESSION: No active disease. Electronically Signed   By: Titus Dubin M.D.   On: 06/11/2017 09:10    Microbiology: Recent Results (from the past 240 hour(s))  Blood Culture (routine x 2)     Status: None (Preliminary result)   Collection Time: 06/11/17  8:55 AM  Result Value Ref Range Status   Specimen Description BLOOD LEFT ANTECUBITAL  Final   Special Requests   Final    BOTTLES DRAWN AEROBIC AND ANAEROBIC Blood Culture adequate volume   Culture   Final    NO GROWTH 4 DAYS Performed at [facility], [address] [place], [place] [address]    Report Status PENDING  Incomplete  Blood Culture (routine x 2)     Status: Abnormal   Collection Time: 06/11/17  9:01 AM  Result Value Ref Range Status   Specimen Description BLOOD LEFT HAND  Final   Special Requests   Final    BOTTLES DRAWN AEROBIC AND ANAEROBIC Blood Culture adequate volume   Culture  Setup Time   Final    GRAM POSITIVE COCCI IN BOTH AEROBIC AND ANAEROBIC BOTTLES CRITICAL RESULT CALLED TO, READ BACK BY AND VERIFIED WITH: B Deltana AT 4259 06/12/17 BY L BENFIELD    Culture (A)  Final    STAPHYLOCOCCUS SPECIES (COAGULASE NEGATIVE) THE SIGNIFICANCE OF ISOLATING THIS ORGANISM FROM A SINGLE SET OF BLOOD CULTURES WHEN MULTIPLE SETS ARE DRAWN IS UNCERTAIN. PLEASE NOTIFY THE MICROBIOLOGY DEPARTMENT WITHIN ONE WEEK IF SPECIATION AND SENSITIVITIES ARE REQUIRED. Performed at [facility], [place] [address], [place], [place] [address]    Report Status 06/15/2017 FINAL  Final  Blood Culture ID Panel (Reflexed)     Status: Abnormal   Collection Time: 06/11/17  9:01 AM  Result Value Ref Range Status   Enterococcus species NOT DETECTED NOT DETECTED Final   Listeria monocytogenes NOT DETECTED NOT DETECTED Final   Staphylococcus species DETECTED (A) NOT DETECTED Final    Comment: Methicillin (oxacillin) resistant coagulase negative staphylococcus. Possible blood culture contaminant (unless isolated from more than one blood culture draw or clinical case suggests pathogenicity). No antibiotic treatment is indicated for blood  culture contaminants. CRITICAL RESULT CALLED TO, READ BACK BY AND VERIFIED WITH: B MANCHERIL,PHARMD AT 5643 06/12/17 BY L BENFIELD    Staphylococcus aureus NOT DETECTED NOT DETECTED Final   Methicillin resistance DETECTED (A) NOT DETECTED Final    Comment: CRITICAL RESULT CALLED TO, READ BACK BY AND VERIFIED WITH: B MANCHERIL,PHARMD AT 3295 06/12/17 BY L BENFIELD    Streptococcus species NOT  DETECTED NOT DETECTED Final   Streptococcus agalactiae NOT DETECTED NOT DETECTED Final   Streptococcus pneumoniae NOT DETECTED NOT DETECTED Final   Streptococcus pyogenes NOT DETECTED NOT DETECTED Final   Acinetobacter baumannii NOT DETECTED NOT DETECTED Final   Enterobacteriaceae species NOT DETECTED NOT DETECTED Final   Enterobacter cloacae complex NOT DETECTED NOT DETECTED Final   Escherichia coli NOT DETECTED NOT DETECTED  Final   Klebsiella oxytoca NOT DETECTED NOT DETECTED Final   Klebsiella pneumoniae NOT DETECTED NOT DETECTED Final   Proteus species NOT DETECTED NOT DETECTED Final   Serratia marcescens NOT DETECTED NOT DETECTED Final   Haemophilus influenzae NOT DETECTED NOT DETECTED Final   Neisseria meningitidis NOT DETECTED NOT DETECTED Final   Pseudomonas aeruginosa NOT DETECTED NOT DETECTED Final   Candida albicans NOT DETECTED NOT DETECTED Final   Candida glabrata NOT DETECTED NOT DETECTED Final   Candida krusei NOT DETECTED NOT DETECTED Final   Candida parapsilosis NOT DETECTED NOT DETECTED Final   Candida tropicalis NOT DETECTED NOT DETECTED Final    Comment: Performed at [facility], [place] [address], [place], [place] [address]  MRSA PCR Screening     Status: Abnormal   Collection Time: 06/11/17  5:00 PM  Result Value Ref Range Status   MRSA by PCR POSITIVE (A) NEGATIVE Final    Comment:        The GeneXpert MRSA Assay (FDA approved for NASAL specimens only), is one component of a comprehensive MRSA colonization surveillance program. It is not intended to diagnose MRSA infection nor to guide or monitor treatment for MRSA infections. RESULT CALLED TO, READ BACK BY AND VERIFIED WITH: G. DAWKINS RN, AT Sharilyn Sites 06/11/17 BY [place] Performed at [facility], [place] [address] [place], [place] [id number]      Labs: Basic Metabolic Panel: Recent Labs  Lab 06/11/17 0856 06/12/17 0220 06/13/17 0932 06/15/17 0335  NA 137 141 141 141  K 4.0 3.1* 3.8 4.6   CL 98* 106 103 104  CO2 24 27 24 29   GLUCOSE 100* 151* 186* 136*  BUN 8 9 15 12   CREATININE 0.49 0.43* 0.65 0.52  CALCIUM 9.3 9.2 8.8* 9.1  PHOS  --   --  2.4* 3.1   Liver Function Tests: Recent Labs  Lab 06/11/17 0856 06/13/17 0932 06/15/17 0335  AST 36  --   --   ALT 30  --   --   ALKPHOS 95  --   --   BILITOT 1.1  --   --   PROT 6.6  --   --   ALBUMIN 3.1* 2.4* 2.4*   No results for input(s): LIPASE, AMYLASE in the last 168 hours. No results for input(s): AMMONIA in the last 168 hours. CBC: Recent Labs  Lab 06/11/17 0856 06/12/17 0220 06/13/17 0333 06/14/17 0146 06/15/17 0335  WBC 11.7* 6.1 7.8 6.8 8.7  NEUTROABS 8.6*  --   --   --  7.1  HGB 13.9 11.4* 11.2* 11.7* 11.4*  HCT 42.1 35.3* 34.8* 35.6* 35.1*  MCV 97.5 97.0 95.6 95.7 95.4  PLT 235 186 216 225 188   Cardiac Enzymes: Recent Labs  Lab 06/11/17 0856  TROPONINI <0.03   BNP: BNP (last 3 results) Recent Labs    07/07/16 0844 11/08/16 0855 03/22/17 0829  BNP 103.1* 50.8 35.0    ProBNP (last 3 results) No results for input(s): PROBNP in the last 8760 hours.  CBG: Recent Labs  Lab 06/12/17 2327 06/13/17 0546 06/13/17 1707 06/14/17 0038 06/14/17 0649  GLUCAP 123* 152* 166* 156* 135*       Signed:  Nita Sells MD   [facility] 06/15/2017, 9:02 AM

## 2017-06-15 NOTE — Discharge Instructions (Signed)
Information on my medicine - ELIQUIS (apixaban)  Why was Eliquis prescribed for you? Eliquis was prescribed for you to reduce the risk of forming blood clots.  What do You need to know about Eliquis ? Take your Eliquis TWICE DAILY - one tablet in the morning and one tablet in the evening with or without food.  It would be best to take the doses about the same time each day.  If you have difficulty swallowing the tablet whole please discuss with your pharmacist how to take the medication safely.  Take Eliquis exactly as prescribed by your doctor and DO NOT stop taking Eliquis without talking to the doctor who prescribed the medication.  Stopping may increase your risk of developing a new clot or stroke.  Refill your prescription before you run out.  After discharge, you should have regular check-up appointments with your healthcare provider that is prescribing your Eliquis.  In the future your dose may need to be changed if your kidney function or weight changes by a significant amount or as you get older.  What do you do if you miss a dose? If you miss a dose, take it as soon as you remember on the same day and resume taking twice daily.  Do not take more than one dose of ELIQUIS at the same time.  Important Safety Information A possible side effect of Eliquis is bleeding. You should call your healthcare provider right away if you experience any of the following: Bleeding from an injury or your nose that does not stop. Unusual colored urine (red or dark brown) or unusual colored stools (red or black). Unusual bruising for unknown reasons. A serious fall or if you hit your head (even if there is no bleeding).  Some medicines may interact with Eliquis and might increase your risk of bleeding or clotting while on Eliquis. To help avoid this, consult your healthcare provider or pharmacist prior to using any new prescription or non-prescription medications, including herbals, vitamins,  non-steroidal anti-inflammatory drugs (NSAIDs) and supplements.  This website has more information on Eliquis (apixaban): www.Eliquis.com.   

## 2017-06-15 NOTE — Plan of Care (Signed)
Discussed plan of care with patient.  Patient is scheduled for discharge tomorrow.  Patient's IV access was removed.  Patient is on room air and is tolerating it well.

## 2017-06-16 LAB — CULTURE, BLOOD (ROUTINE X 2)
CULTURE: NO GROWTH
Special Requests: ADEQUATE

## 2017-10-15 ENCOUNTER — Other Ambulatory Visit (HOSPITAL_COMMUNITY): Payer: Self-pay | Admitting: Internal Medicine

## 2017-10-15 DIAGNOSIS — R1310 Dysphagia, unspecified: Secondary | ICD-10-CM

## 2017-10-19 ENCOUNTER — Ambulatory Visit (HOSPITAL_COMMUNITY)
Admission: RE | Admit: 2017-10-19 | Discharge: 2017-10-19 | Disposition: A | Discharge status: Home / Self Care | Payer: Medicare Other | Source: Ambulatory Visit | Attending: Internal Medicine | Admitting: Internal Medicine

## 2017-10-19 DIAGNOSIS — R1312 Dysphagia, oropharyngeal phase: Secondary | ICD-10-CM | POA: Insufficient documentation

## 2017-10-19 DIAGNOSIS — R1310 Dysphagia, unspecified: Secondary | ICD-10-CM

## 2017-10-19 NOTE — Progress Notes (Signed)
Modified Barium Swallow Progress Note  Patient Details  Name: [name] MRN: [id number] Date of Birth: [birth year]  Today's Date: 10/19/2017  Modified Barium Swallow completed.  Full report located under Chart Review in the Imaging Section.  Brief recommendations include the following:  Clinical Impression  Pt has a persistent, mild pharyngeal dysphagia. Trace penetration occurs with thin and nectar thick liquids due to impaired timing/coordination for airway closure and mildly reduced epiglottic inversion, felt to be related to presence of cervical hardware. Pt was resistent to testing, but did attempt some of the strategies and consistencies recommended by SLP given Mod encouragement and cueing. Small, single sips of thin liquids were consumed with good airway protection. As bolus size became larger and penetration started to occur, a cued cough was effective at clearing the airway. Further, a chin tuck was effective at preventing airway compromise. Minimal residue remained in the valleculae, primarily with thicker liquids and solids, and she often cleared this spontaneously with a cued second swallow. Will defer diet advancement to primary SLP; however, could consider advancing to regular textures and thin liquids if using small, single sips along with use of a chin tuck OR with use of an intermittent cough, depending upon pt compliance. Of note, pt says she had never stopped drinking thin liquids and that she will continue to drink them regardless of whichever consistency is recommended after this test.   Swallow Evaluation Recommendations       SLP Diet Recommendations: Regular solids;Thin liquid   Liquid Administration via: Cup   Medication Administration: Whole meds with puree   Supervision: Patient able to self feed;Full supervision/cueing for compensatory strategies   Compensations: Slow rate;Small sips/bites;Clear throat after each swallow;Chin tuck   Postural Changes: Seated  upright at 90 degrees;Remain semi-upright after after feeds/meals (Comment)   Oral Care Recommendations: Oral care QID(more frequent oral care recommended given risk for aspiratio)        Germain Osgood 10/19/2017,2:01 PM   Germain Osgood, M.A. [place] [facility] Environmental education officer [phone] Office [phone]

## 2018-09-18 ENCOUNTER — Encounter (HOSPITAL_COMMUNITY): Payer: Self-pay

## 2018-09-18 ENCOUNTER — Emergency Department (HOSPITAL_COMMUNITY): Payer: Medicare Other

## 2018-09-18 ENCOUNTER — Emergency Department (HOSPITAL_COMMUNITY)
Admission: EM | Admit: 2018-09-18 | Discharge: 2018-09-18 | Disposition: A | Discharge status: Home / Self Care | Payer: Medicare Other | Attending: Emergency Medicine | Admitting: Emergency Medicine

## 2018-09-18 ENCOUNTER — Other Ambulatory Visit: Payer: Self-pay

## 2018-09-18 DIAGNOSIS — S50811A Abrasion of right forearm, initial encounter: Secondary | ICD-10-CM | POA: Diagnosis not present

## 2018-09-18 DIAGNOSIS — S80811A Abrasion, right lower leg, initial encounter: Secondary | ICD-10-CM | POA: Diagnosis not present

## 2018-09-18 DIAGNOSIS — Y9389 Activity, other specified: Secondary | ICD-10-CM | POA: Diagnosis not present

## 2018-09-18 DIAGNOSIS — R51 Headache: Secondary | ICD-10-CM | POA: Diagnosis not present

## 2018-09-18 DIAGNOSIS — T148XXA Other injury of unspecified body region, initial encounter: Secondary | ICD-10-CM

## 2018-09-18 DIAGNOSIS — W06XXXA Fall from bed, initial encounter: Secondary | ICD-10-CM | POA: Diagnosis not present

## 2018-09-18 DIAGNOSIS — J45909 Unspecified asthma, uncomplicated: Secondary | ICD-10-CM | POA: Diagnosis not present

## 2018-09-18 DIAGNOSIS — Z87891 Personal history of nicotine dependence: Secondary | ICD-10-CM | POA: Insufficient documentation

## 2018-09-18 DIAGNOSIS — S80812A Abrasion, left lower leg, initial encounter: Secondary | ICD-10-CM | POA: Insufficient documentation

## 2018-09-18 DIAGNOSIS — I10 Essential (primary) hypertension: Secondary | ICD-10-CM | POA: Insufficient documentation

## 2018-09-18 DIAGNOSIS — S92425A Nondisplaced fracture of distal phalanx of left great toe, initial encounter for closed fracture: Secondary | ICD-10-CM | POA: Diagnosis not present

## 2018-09-18 DIAGNOSIS — Z7902 Long term (current) use of antithrombotics/antiplatelets: Secondary | ICD-10-CM | POA: Insufficient documentation

## 2018-09-18 DIAGNOSIS — W19XXXD Unspecified fall, subsequent encounter: Secondary | ICD-10-CM

## 2018-09-18 DIAGNOSIS — Y92122 Bedroom in nursing home as the place of occurrence of the external cause: Secondary | ICD-10-CM | POA: Insufficient documentation

## 2018-09-18 DIAGNOSIS — Y999 Unspecified external cause status: Secondary | ICD-10-CM | POA: Diagnosis not present

## 2018-09-18 DIAGNOSIS — S99922A Unspecified injury of left foot, initial encounter: Secondary | ICD-10-CM | POA: Diagnosis present

## 2018-09-18 NOTE — ED Notes (Signed)
Pt dressed into a gown and placed on purewick due to incontinence, per pt.  Pt has call bell within reach and knows to call staff if assistance is needed.

## 2018-09-18 NOTE — ED Notes (Signed)
[facility] contacted for transport back to facility. Paperwork printed and at nursing station.

## 2018-09-18 NOTE — ED Notes (Signed)
Wounds undressed per MD. Patient has skin tears to right forearm and right upper arm. Patient also has skin tears to bilateral lower legs. No bleeding noted at this time. Patient denies any needs at this time. Will continue to monitor patient.

## 2018-09-18 NOTE — ED Notes (Signed)
Patient transported to CT 

## 2018-09-18 NOTE — ED Notes (Signed)
Patient transported to facility by [facility]. Discharge instructions reviewed with patient. Attempted to given report to facility but no answer.

## 2018-09-18 NOTE — ED Provider Notes (Signed)
[facility] DEPT Provider Note   CSN: [id number] Arrival date & time: 09/18/18  1717    History   Chief Complaint Chief Complaint  Patient presents with   Fall   bilateral Knee Pain    HPI [name] is a 85 y.o. female.     HPI Patient states she fell out of bed earlier today.  Coming from a nursing facility.  Unwitnessed fall.  Does not believe that she hit her head.  She is complaining of mild headache since the fall.  She also complains of right knee pain and left foot pain.  He sustained several skin tears.  Denies any focal weakness or numbness.  Patient is on a blood thinner for PE. Past Medical History:  Diagnosis Date   Arthritis    Asthma    Breast cancer ([place]) 2006   right w/lumpectomy   COPD (chronic obstructive pulmonary disease) ([place])    Depression    Diverticulosis of colon    Endometrial hyperplasia 01/1996   fibroids, adenomyosis   Fall 02/2017   Frequent UTI    History of colon polyps    Hypertension    Insomnia    Osteopenia 12/08   hip   Pre-diabetes    Spinal stenosis 2001   Vitamin D deficiency     Patient Active Problem List   Diagnosis Date Noted   Laceration of forehead    Acute on chronic respiratory failure with hypoxia ([place]) 03/15/2017   Fall 03/15/2017   Depression with anxiety 03/15/2017   COPD exacerbation ([place]) 03/15/2017   Acute lower UTI    Sepsis ([place]) 11/08/2016   Morbid obesity due to excess calories ([place]) 09/02/2016   Esophageal dysfunction 09/01/2016   Wheelchair bound 08/09/2016   Wound healing, delayed 08/09/2016   Chronic ulcer of right leg ([place]) 07/07/2016   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   Breast cancer, right breast ([place]) 07/18/2014   CONCUSSION WITH LOC OF UNSPECIFIED DURATION 07/19/2009   OTH SYMPTOMS INVOLVING RESPIRATORY SYSTEM&CHEST 11/12/2008   UNSPECIFIED DISORDER OF ANKLE AND FOOT JOINT 05/11/2008   CERUMEN  IMPACTION, BILATERAL 89/16/9450   Dysmetabolic syndrome X 38/88/2800   UNS ADVRS EFF UNS RX MEDICINAL&BIOLOGICAL SBSTNC 08/12/2007   Other and unspecified hyperlipidemia 04/01/2007   INSOMNIA, PERSISTENT 09/09/2006   Essential hypertension 09/09/2006   DIVERTICULOSIS, COLON 09/09/2006   Osteoarthritis 09/09/2006   Spinal stenosis 09/09/2006   COLONIC POLYPS, HX OF 09/09/2006    Past Surgical History:  Procedure Laterality Date   BILATERAL SALPINGOOPHORECTOMY  12/97   BREAST LUMPECTOMY Right 03/2003   CARPAL TUNNEL RELEASE     CERVICAL FUSION     CERVICAL LAMINECTOMY  1975   x4   CHOLECYSTECTOMY  11/00   lap chole   HYSTEROSCOPY  10/97   D&C, postmenopausal bleeding   LUMBAR LAMINECTOMY  10/06   with spinal stenosis   manipulation of hip for dislocation     ROTATOR CUFF REPAIR Right 11/96   TONSILLECTOMY AND ADENOIDECTOMY     TOTAL ABDOMINAL HYSTERECTOMY  12/97   endo hyperplasia, fibroids, adenomyosis   TOTAL HIP ARTHROPLASTY Right 8/00   TOTAL HIP REVISION Right 07/10/2015   Procedure: RIGHT ACETABULAR VS TOTAL HIP REVISION;  Surgeon: Gaynelle Arabian, MD;  Location: [facility];  Service: Orthopedics;  Laterality: Right;   TOTAL KNEE ARTHROPLASTY Left 06/2003   TOTAL KNEE ARTHROPLASTY Right 10/06   TUBAL LIGATION Bilateral 1968     OB History    Gravida  3   Para  3   Term  3   Preterm      AB      Living        SAB      TAB      Ectopic      Multiple      Live Births               Home Medications    Prior to Admission medications   Medication Sig Start Date End Date Taking? Authorizing Provider  albuterol (ACCUNEB) 0.63 MG/3ML nebulizer solution Take 3 mLs (0.63 mg total) by nebulization 4 (four) times daily. 03/24/17   Debbe Odea, MD  albuterol (PROAIR HFA) 108 (90 BASE) MCG/ACT inhaler Inhale 2 puffs into the lungs every 4 (four) hours as needed for shortness of breath.     [provider]    amoxicillin-clavulanate (AUGMENTIN) 875-125 MG tablet Take 1 tablet by mouth every 12 (twelve) hours. 06/15/17   Nita Sells, MD  apixaban (ELIQUIS) 5 MG TABS tablet Take 1 tablet (5 mg total) by mouth 2 (two) times daily. 04/10/17   Nita Sells, MD  aspirin EC 325 MG tablet Take 325 mg by mouth daily.     [provider]  Benzocaine-Menthol (CEPACOL SORE THROAT) 15-2.6 MG LOZG Use as directed 1 lozenge in the mouth or throat every 4 (four) hours as needed (for sore throat).    [provider]  benzonatate (TESSALON) 200 MG capsule Take 200 mg by mouth 3 (three) times daily as needed for cough.    [provider]  buPROPion (WELLBUTRIN XL) 300 MG 24 hr tablet Take 1 tablet (300 mg total) by mouth every morning. 04/04/17   Nita Sells, MD  Calcium Carb-Cholecalciferol (CALCIUM+D3) 600-800 MG-UNIT TABS Take 1 tablet by mouth daily.    [provider]  escitalopram (LEXAPRO) 10 MG tablet Take 1 tablet (10 mg total) by mouth daily. 04/04/17   Nita Sells, MD  fluticasone (FLONASE) 50 MCG/ACT nasal spray Place 1 spray into both nostrils 2 (two) times daily.    [provider]  food thickener (THICK IT) POWD Use as needed for thickening fluids 06/15/17   Nita Sells, MD  ibuprofen (ADVIL,MOTRIN) 400 MG tablet Take 1 tablet (400 mg total) by mouth every 6 (six) hours as needed for moderate pain. 06/15/17   Nita Sells, MD  ipratropium-albuterol (DUONEB) 0.5-2.5 (3) MG/3ML SOLN Take 3 mLs by nebulization every 6 (six) hours as needed. Patient taking differently: Take 3 mLs by nebulization every 6 (six) hours as needed (wheezing).  03/16/17   Debbe Odea, MD  metoprolol tartrate (LOPRESSOR) 25 MG tablet Take 0.5 tablets (12.5 mg total) by mouth 2 (two) times daily. 08/12/16   Florencia Reasons, MD  mirabegron ER (MYRBETRIQ) 50 MG TB24 tablet Take 50 mg by mouth daily.    [provider]  mometasone-formoterol  (DULERA) 200-5 MCG/ACT AERO Inhale 1 puff into the lungs 2 (two) times daily.    [provider]  montelukast (SINGULAIR) 10 MG tablet Take 10 mg by mouth at bedtime.      [provider]  ondansetron (ZOFRAN) 4 MG tablet Take 4 mg by mouth every 8 (eight) hours as needed for nausea or vomiting.    [provider]  polyethylene glycol (MIRALAX / GLYCOLAX) packet Take 17 g by mouth daily as needed for mild constipation. 07/12/15   Perkins, Alexzandrew L, PA-C  Propylene Glycol-Glycerin (SOOTHE) 0.6-0.6 % SOLN Apply 1  drop to eye 2 (two) times daily. 11/28/15   Domenic Moras, PA-C  senna (SENOKOT) 8.6 MG TABS tablet Take 1 tablet by mouth daily as needed for mild constipation.    [provider]  sodium phosphate (FLEET) 7-19 GM/118ML ENEM Place 1 enema rectally daily as needed for severe constipation.    [provider]    Family History Family History  Problem Relation Age of Onset   Diabetes Father    Colon cancer Father 36   Heart failure Father    Arthritis Mother    Thyroid disease Mother    Osteoporosis Mother    Diabetes Brother    Diabetes Brother    Osteoporosis Maternal Aunt    Osteoporosis Maternal Grandmother     Social History Social History   Tobacco Use   Smoking status: Former Smoker    Packs/day: 0.25    Years: 10.00    Pack years: 2.50    Types: Cigarettes    Quit date: 04/24/1990    Years since quitting: 28.4   Smokeless tobacco: Never Used  Substance Use Topics   Alcohol use: Yes    Alcohol/week: 4.0 standard drinks    Types: 4 Glasses of wine per week    Comment: occ   Drug use: No     Allergies   Oxycodone hcl [oxycodone hcl], Morphine sulfate, and Sulfamethoxazole   Review of Systems Review of Systems  Constitutional: Negative for chills and fever.  HENT: Negative for trouble swallowing.   Eyes: Negative for visual disturbance.  Respiratory: Negative for cough and shortness of breath.    Cardiovascular: Negative for chest pain.  Gastrointestinal: Negative for abdominal pain, diarrhea, nausea and vomiting.  Musculoskeletal: Positive for arthralgias. Negative for back pain, myalgias and neck pain.  Skin: Positive for wound.  Neurological: Positive for headaches. Negative for dizziness, syncope, weakness, light-headedness and numbness.  All other systems reviewed and are negative.    Physical Exam Updated Vital Signs BP (!) 150/84 (BP Location: Right Arm)    Pulse 81    Temp 97.9 F (36.6 C) (Oral)    Resp (!) 23    Ht 5\' 8"  (1.727 m)    Wt 90.7 kg    SpO2 99%    BMI 30.41 kg/m   Physical Exam Vitals signs and nursing note reviewed.  Constitutional:      Appearance: Normal appearance. She is well-developed. She is obese.  HENT:     Head: Normocephalic and atraumatic.     Comments: No obvious head injury.  No intraoral trauma.  No facial asymmetry. Eyes:     Pupils: Pupils are equal, round, and reactive to light.  Neck:     Musculoskeletal: Normal range of motion and neck supple.     Comments: Mild posterior cervical tenderness to palpation without focality. Cardiovascular:     Rate and Rhythm: Normal rate and regular rhythm.     Heart sounds: No murmur. No gallop.   Pulmonary:     Effort: Pulmonary effort is normal.     Breath sounds: Normal breath sounds.  Abdominal:     General: Bowel sounds are normal.     Palpations: Abdomen is soft.     Tenderness: There is no abdominal tenderness. There is no guarding or rebound.  Musculoskeletal: Normal range of motion.        General: Tenderness present. No swelling or deformity.     Comments: Patient has mild tenderness to palpation over the left midfoot.  No  obvious deformity.  Full range of motion of the left ankle and knee without discomfort.  Patient has pain with attempted range of motion of the right knee.  No obvious deformity.  Distal pulses intact.  Skin:    General: Skin is warm and dry.     Findings: No  erythema or rash.     Comments: Superficial skin abrasion to the right forearm, right lower leg and left lower leg.  Bleeding is controlled.  Neurological:     General: No focal deficit present.     Mental Status: She is alert and oriented to person, place, and time.     Comments: 5/5 motor in all extremities.  Sensation intact.  Psychiatric:        Behavior: Behavior normal.      ED Treatments / Results  Labs (all labs ordered are listed, but only abnormal results are displayed) Labs Reviewed - No data to display  EKG None  Radiology Ct Head Wo Contrast  Result Date: 09/18/2018 CLINICAL DATA:  Un witnessed fall. Patient on anticoagulation. Head trauma. EXAM: CT HEAD WITHOUT CONTRAST CT CERVICAL SPINE WITHOUT CONTRAST TECHNIQUE: Multidetector CT imaging of the head and cervical spine was performed following the standard protocol without intravenous contrast. Multiplanar CT image reconstructions of the cervical spine were also generated. COMPARISON:  04/01/2017 FINDINGS: CT HEAD FINDINGS Brain: No evidence of acute infarction, hemorrhage, hydrocephalus, extra-axial collection or mass lesion/mass effect. There is ventricular sulcal enlargement reflecting moderate diffuse atrophy. Patchy periventricular white matter hypoattenuation is noted consistent with chronic microvascular ischemic change. Vascular: No hyperdense vessel or unexpected calcification. Skull: Normal. Negative for fracture or focal lesion. Sinuses/Orbits: Globes and orbits are unremarkable. Sinuses and mastoid air cells are clear. Other: None. CT CERVICAL SPINE FINDINGS Alignment: Straightened cervical lordosis.  No spondylolisthesis. Skull base and vertebrae: No acute fracture. Status post anterior cervical disc fusion from C4 through C7. Mature bone spans the disc interspaces. The orthopedic hardware is well-seated with no evidence of loosening. A wire encircles the spinous processes from C4 through C7. These findings are stable  from the prior CT. No bone lesion. Soft tissues and spinal canal: No prevertebral fluid or swelling. No visible canal hematoma. Disc levels: Mild-to-moderate loss of disc height at C2-C3. Marked loss of disc height with evidence of acquired fusion at the C 3-C4 level. Moderate loss of disc height at C7-T1. Bilateral facet degenerative changes with severe facet arthropathy on the left at C 1-C2. No convincing disc herniation. Upper chest: No acute findings.  Mild scarring at the lung apices. Other: None. IMPRESSION: HEAD CT 1. No acute intracranial abnormalities. 2. Atrophy and chronic microvascular ischemic change. 3. No skull fracture. CERVICAL CT 1. No fracture or acute finding. 2. Stable changes from previous cervical spine fusion. Stable degenerative changes. Electronically Signed   By: Lajean Manes M.D.   On: 09/18/2018 19:09   Ct Cervical Spine Wo Contrast  Result Date: 09/18/2018 CLINICAL DATA:  Un witnessed fall. Patient on anticoagulation. Head trauma. EXAM: CT HEAD WITHOUT CONTRAST CT CERVICAL SPINE WITHOUT CONTRAST TECHNIQUE: Multidetector CT imaging of the head and cervical spine was performed following the standard protocol without intravenous contrast. Multiplanar CT image reconstructions of the cervical spine were also generated. COMPARISON:  04/01/2017 FINDINGS: CT HEAD FINDINGS Brain: No evidence of acute infarction, hemorrhage, hydrocephalus, extra-axial collection or mass lesion/mass effect. There is ventricular sulcal enlargement reflecting moderate diffuse atrophy. Patchy periventricular white matter hypoattenuation is noted consistent with chronic microvascular ischemic change. Vascular: No  hyperdense vessel or unexpected calcification. Skull: Normal. Negative for fracture or focal lesion. Sinuses/Orbits: Globes and orbits are unremarkable. Sinuses and mastoid air cells are clear. Other: None. CT CERVICAL SPINE FINDINGS Alignment: Straightened cervical lordosis.  No spondylolisthesis. Skull  base and vertebrae: No acute fracture. Status post anterior cervical disc fusion from C4 through C7. Mature bone spans the disc interspaces. The orthopedic hardware is well-seated with no evidence of loosening. A wire encircles the spinous processes from C4 through C7. These findings are stable from the prior CT. No bone lesion. Soft tissues and spinal canal: No prevertebral fluid or swelling. No visible canal hematoma. Disc levels: Mild-to-moderate loss of disc height at C2-C3. Marked loss of disc height with evidence of acquired fusion at the C 3-C4 level. Moderate loss of disc height at C7-T1. Bilateral facet degenerative changes with severe facet arthropathy on the left at C 1-C2. No convincing disc herniation. Upper chest: No acute findings.  Mild scarring at the lung apices. Other: None. IMPRESSION: HEAD CT 1. No acute intracranial abnormalities. 2. Atrophy and chronic microvascular ischemic change. 3. No skull fracture. CERVICAL CT 1. No fracture or acute finding. 2. Stable changes from previous cervical spine fusion. Stable degenerative changes. Electronically Signed   By: Lajean Manes M.D.   On: 09/18/2018 19:09   Dg Knee Complete 4 Views Right  Result Date: 09/18/2018 CLINICAL DATA:  Unwitnessed fall.  Knee pain EXAM: RIGHT KNEE - COMPLETE 4+ VIEW COMPARISON:  None. FINDINGS: Small joint effusion. Prior right knee replacement. No acute bony abnormality. Specifically, no fracture, subluxation, or dislocation. IMPRESSION: No acute bony abnormality. Electronically Signed   By: Rolm Baptise M.D.   On: 09/18/2018 18:59   Dg Foot Complete Left  Result Date: 09/18/2018 CLINICAL DATA:  Recent fall with first toe pain, initial encounter EXAM: LEFT FOOT - COMPLETE 3+ VIEW COMPARISON:  None. FINDINGS: Generalized osteopenia is noted. No soft tissue abnormality is seen. Cortical irregularity is noted along the lateral aspect of the first distal phalanx consistent with undisplaced fracture. No other definitive  fracture is seen. IMPRESSION: Fracture at the base of the first distal phalanx laterally. Electronically Signed   By: Inez Catalina M.D.   On: 09/18/2018 19:02    Procedures Procedures (including critical care time)  Medications Ordered in ED Medications - No data to display   Initial Impression / Assessment and Plan / ED Course  I have reviewed the triage vital signs and the nursing notes.  Pertinent labs & imaging results that were available during my care of the patient were reviewed by me and considered in my medical decision making (see chart for details).        CT head and cervical spine without abnormality.  Patient does have a nondisplaced phalanx fracture on the left foot.  Skin tears were cleaned and dressed.  Advised to follow-up with her primary physician.  Return precautions given.  Final Clinical Impressions(s) / ED Diagnoses   Final diagnoses:  Fall, subsequent encounter  Multiple skin tears  Nondisplaced fracture of distal phalanx of left great toe, initial encounter for closed fracture    ED Discharge Orders    None       Julianne Rice, MD 09/18/18 2231

## 2018-09-18 NOTE — ED Triage Notes (Signed)
Patient arrived via [facility] from [facility]. Patient is AOx4 and ambulatory at baseline. Facility called 911 due to patient having unwitnessed fall. Patient fell on both knees and is complaining of bilateral toe pain.   BP 160/90 P 88 O2 94% Resp. 17 Temp 97.5

## 2018-09-18 NOTE — ED Notes (Signed)
Attempted to call family for update, but no answer. [facility] contacted for transport to facility.

## 2018-09-28 ENCOUNTER — Encounter (HOSPITAL_COMMUNITY): Payer: Self-pay

## 2018-09-28 ENCOUNTER — Inpatient Hospital Stay (HOSPITAL_COMMUNITY)
Admission: EM | Admit: 2018-09-28 | Discharge: 2018-10-02 | DRG: 689 | Disposition: A | Discharge status: Skilled Nursing Facility | Payer: Medicare Other | Source: Skilled Nursing Facility | Attending: Internal Medicine | Admitting: Internal Medicine

## 2018-09-28 ENCOUNTER — Emergency Department (HOSPITAL_COMMUNITY): Payer: Medicare Other

## 2018-09-28 ENCOUNTER — Other Ambulatory Visit: Payer: Self-pay

## 2018-09-28 DIAGNOSIS — Z96641 Presence of right artificial hip joint: Secondary | ICD-10-CM | POA: Diagnosis present

## 2018-09-28 DIAGNOSIS — N390 Urinary tract infection, site not specified: Secondary | ICD-10-CM | POA: Diagnosis not present

## 2018-09-28 DIAGNOSIS — R509 Fever, unspecified: Secondary | ICD-10-CM

## 2018-09-28 DIAGNOSIS — Z87891 Personal history of nicotine dependence: Secondary | ICD-10-CM

## 2018-09-28 DIAGNOSIS — Z882 Allergy status to sulfonamides status: Secondary | ICD-10-CM

## 2018-09-28 DIAGNOSIS — K5730 Diverticulosis of large intestine without perforation or abscess without bleeding: Secondary | ICD-10-CM | POA: Diagnosis present

## 2018-09-28 DIAGNOSIS — N3000 Acute cystitis without hematuria: Secondary | ICD-10-CM

## 2018-09-28 DIAGNOSIS — R410 Disorientation, unspecified: Secondary | ICD-10-CM

## 2018-09-28 DIAGNOSIS — G4700 Insomnia, unspecified: Secondary | ICD-10-CM | POA: Diagnosis present

## 2018-09-28 DIAGNOSIS — F329 Major depressive disorder, single episode, unspecified: Secondary | ICD-10-CM | POA: Diagnosis present

## 2018-09-28 DIAGNOSIS — Z96653 Presence of artificial knee joint, bilateral: Secondary | ICD-10-CM | POA: Diagnosis present

## 2018-09-28 DIAGNOSIS — Z66 Do not resuscitate: Secondary | ICD-10-CM | POA: Diagnosis present

## 2018-09-28 DIAGNOSIS — R1310 Dysphagia, unspecified: Secondary | ICD-10-CM | POA: Diagnosis present

## 2018-09-28 DIAGNOSIS — J441 Chronic obstructive pulmonary disease with (acute) exacerbation: Secondary | ICD-10-CM | POA: Diagnosis present

## 2018-09-28 DIAGNOSIS — M8580 Other specified disorders of bone density and structure, unspecified site: Secondary | ICD-10-CM | POA: Diagnosis present

## 2018-09-28 DIAGNOSIS — E559 Vitamin D deficiency, unspecified: Secondary | ICD-10-CM | POA: Diagnosis present

## 2018-09-28 DIAGNOSIS — Z981 Arthrodesis status: Secondary | ICD-10-CM

## 2018-09-28 DIAGNOSIS — Z8262 Family history of osteoporosis: Secondary | ICD-10-CM

## 2018-09-28 DIAGNOSIS — G9341 Metabolic encephalopathy: Secondary | ICD-10-CM | POA: Diagnosis present

## 2018-09-28 DIAGNOSIS — G9340 Encephalopathy, unspecified: Secondary | ICD-10-CM | POA: Diagnosis present

## 2018-09-28 DIAGNOSIS — J9601 Acute respiratory failure with hypoxia: Secondary | ICD-10-CM

## 2018-09-28 DIAGNOSIS — B9620 Unspecified Escherichia coli [E. coli] as the cause of diseases classified elsewhere: Secondary | ICD-10-CM | POA: Diagnosis present

## 2018-09-28 DIAGNOSIS — J45901 Unspecified asthma with (acute) exacerbation: Secondary | ICD-10-CM | POA: Diagnosis present

## 2018-09-28 DIAGNOSIS — Z79890 Hormone replacement therapy (postmenopausal): Secondary | ICD-10-CM

## 2018-09-28 DIAGNOSIS — M1990 Unspecified osteoarthritis, unspecified site: Secondary | ICD-10-CM | POA: Diagnosis present

## 2018-09-28 DIAGNOSIS — Z885 Allergy status to narcotic agent status: Secondary | ICD-10-CM

## 2018-09-28 DIAGNOSIS — Z79899 Other long term (current) drug therapy: Secondary | ICD-10-CM

## 2018-09-28 DIAGNOSIS — Z853 Personal history of malignant neoplasm of breast: Secondary | ICD-10-CM

## 2018-09-28 DIAGNOSIS — Z9079 Acquired absence of other genital organ(s): Secondary | ICD-10-CM

## 2018-09-28 DIAGNOSIS — Z8261 Family history of arthritis: Secondary | ICD-10-CM

## 2018-09-28 DIAGNOSIS — Z90710 Acquired absence of both cervix and uterus: Secondary | ICD-10-CM

## 2018-09-28 DIAGNOSIS — J9621 Acute and chronic respiratory failure with hypoxia: Secondary | ICD-10-CM | POA: Diagnosis present

## 2018-09-28 DIAGNOSIS — Z86711 Personal history of pulmonary embolism: Secondary | ICD-10-CM

## 2018-09-28 DIAGNOSIS — Z800 Family history of malignant neoplasm of digestive organs: Secondary | ICD-10-CM

## 2018-09-28 DIAGNOSIS — Z87440 Personal history of urinary (tract) infections: Secondary | ICD-10-CM

## 2018-09-28 DIAGNOSIS — Z7982 Long term (current) use of aspirin: Secondary | ICD-10-CM

## 2018-09-28 DIAGNOSIS — Z993 Dependence on wheelchair: Secondary | ICD-10-CM

## 2018-09-28 DIAGNOSIS — Z7901 Long term (current) use of anticoagulants: Secondary | ICD-10-CM

## 2018-09-28 DIAGNOSIS — Z20828 Contact with and (suspected) exposure to other viral communicable diseases: Secondary | ICD-10-CM | POA: Diagnosis present

## 2018-09-28 DIAGNOSIS — Z9049 Acquired absence of other specified parts of digestive tract: Secondary | ICD-10-CM

## 2018-09-28 DIAGNOSIS — I10 Essential (primary) hypertension: Secondary | ICD-10-CM | POA: Diagnosis present

## 2018-09-28 DIAGNOSIS — Z8249 Family history of ischemic heart disease and other diseases of the circulatory system: Secondary | ICD-10-CM

## 2018-09-28 DIAGNOSIS — Z9851 Tubal ligation status: Secondary | ICD-10-CM

## 2018-09-28 DIAGNOSIS — R188 Other ascites: Secondary | ICD-10-CM

## 2018-09-28 NOTE — ED Provider Notes (Signed)
[facility] DEPT Provider Note   CSN: [id number] Arrival date & time: 09/28/18  2126     History   Chief Complaint Chief Complaint  Patient presents with  . Altered Mental Status    HPI [name] is a 85 y.o. female.     Patient presents to the emergency department with a chief complaint of altered mental status.  She presents from [facility].  She reports feeling short of breath and "choking," but this was reported as coughing from the nursing home.  He does not wear oxygen at home, but has been requiring 2 L nasal cannula to maintain greater than 90% oxygenation in the emergency department.  She denies being in any pain.  Denies any other associated symptoms.  The history is provided by the patient. No language interpreter was used.    Past Medical History:  Diagnosis Date  . Arthritis   . Asthma   . Breast cancer ([place]) 2006   right w/lumpectomy  . COPD (chronic obstructive pulmonary disease) ([place])   . Depression   . Diverticulosis of colon   . Endometrial hyperplasia 01/1996   fibroids, adenomyosis  . Fall 02/2017  . Frequent UTI   . History of colon polyps   . Hypertension   . Insomnia   . Osteopenia 12/08   hip  . Pre-diabetes   . Spinal stenosis 2001  . Vitamin D deficiency     Patient Active Problem List   Diagnosis Date Noted  . Laceration of forehead   . Acute on chronic respiratory failure with hypoxia ([place]) 03/15/2017  . Fall 03/15/2017  . Depression with anxiety 03/15/2017  . COPD exacerbation ([place]) 03/15/2017  . Acute lower UTI   . Sepsis ([place]) 11/08/2016  . Morbid obesity due to excess calories ([place]) 09/02/2016  . Esophageal dysfunction 09/01/2016  . Wheelchair bound 08/09/2016  . Wound healing, delayed 08/09/2016  . Chronic ulcer of right leg ([place]) 07/07/2016  . 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 Breast cancer, right breast ([place]) 07/18/2014  . CONCUSSION WITH LOC OF  UNSPECIFIED DURATION 07/19/2009  . OTH SYMPTOMS INVOLVING RESPIRATORY SYSTEM&CHEST 11/12/2008  . UNSPECIFIED DISORDER OF ANKLE AND FOOT JOINT 05/11/2008  . CERUMEN IMPACTION, BILATERAL 05/04/2008  . Dysmetabolic syndrome X 24/26/8341  . UNS ADVRS EFF UNS RX MEDICINAL&BIOLOGICAL SBSTNC 08/12/2007  . Other and unspecified hyperlipidemia 04/01/2007  . INSOMNIA, PERSISTENT 09/09/2006  . Essential hypertension 09/09/2006  . DIVERTICULOSIS, COLON 09/09/2006  . Osteoarthritis 09/09/2006  . Spinal stenosis 09/09/2006  . COLONIC POLYPS, HX OF 09/09/2006    Past Surgical History:  Procedure Laterality Date  . BILATERAL SALPINGOOPHORECTOMY  12/97  . BREAST LUMPECTOMY Right 03/2003  . CARPAL TUNNEL RELEASE    . CERVICAL FUSION    . CERVICAL LAMINECTOMY  1975   x4  . CHOLECYSTECTOMY  11/00   lap chole  . HYSTEROSCOPY  10/97   D&C, postmenopausal bleeding  . LUMBAR LAMINECTOMY  10/06   with spinal stenosis  . manipulation of hip for dislocation    . ROTATOR CUFF REPAIR Right 11/96  . TONSILLECTOMY AND ADENOIDECTOMY    . TOTAL ABDOMINAL HYSTERECTOMY  12/97   endo hyperplasia, fibroids, adenomyosis  . TOTAL HIP ARTHROPLASTY Right 8/00  . TOTAL HIP REVISION Right 07/10/2015   Procedure: RIGHT ACETABULAR VS TOTAL HIP REVISION;  Surgeon: Gaynelle Arabian, MD;  Location: [facility] ORS;  Service: Orthopedics;  Laterality: Right;  . TOTAL KNEE ARTHROPLASTY Left 06/2003  . TOTAL  KNEE ARTHROPLASTY Right 10/06  . TUBAL LIGATION Bilateral 1968     OB History    Gravida  3   Para  3   Term  3   Preterm      AB      Living        SAB      TAB      Ectopic      Multiple      Live Births               Home Medications    Prior to Admission medications   Medication Sig Start Date End Date Taking? Authorizing Provider  albuterol (ACCUNEB) 0.63 MG/3ML nebulizer solution Take 3 mLs (0.63 mg total) by nebulization 4 (four) times daily. 03/24/17   Debbe Odea, MD  albuterol (PROAIR HFA)  108 (90 BASE) MCG/ACT inhaler Inhale 2 puffs into the lungs every 4 (four) hours as needed for shortness of breath.     [provider]  amoxicillin-clavulanate (AUGMENTIN) 875-125 MG tablet Take 1 tablet by mouth every 12 (twelve) hours. 06/15/17   Nita Sells, MD  apixaban (ELIQUIS) 5 MG TABS tablet Take 1 tablet (5 mg total) by mouth 2 (two) times daily. 04/10/17   Nita Sells, MD  aspirin EC 325 MG tablet Take 325 mg by mouth daily.     [provider]  Benzocaine-Menthol (CEPACOL SORE THROAT) 15-2.6 MG LOZG Use as directed 1 lozenge in the mouth or throat every 4 (four) hours as needed (for sore throat).    [provider]  benzonatate (TESSALON) 200 MG capsule Take 200 mg by mouth 3 (three) times daily as needed for cough.    [provider]  buPROPion (WELLBUTRIN XL) 300 MG 24 hr tablet Take 1 tablet (300 mg total) by mouth every morning. 04/04/17   Nita Sells, MD  Calcium Carb-Cholecalciferol (CALCIUM+D3) 600-800 MG-UNIT TABS Take 1 tablet by mouth daily.    [provider]  escitalopram (LEXAPRO) 10 MG tablet Take 1 tablet (10 mg total) by mouth daily. 04/04/17   Nita Sells, MD  fluticasone (FLONASE) 50 MCG/ACT nasal spray Place 1 spray into both nostrils 2 (two) times daily.    [provider]  food thickener (THICK IT) POWD Use as needed for thickening fluids 06/15/17   Nita Sells, MD  ibuprofen (ADVIL,MOTRIN) 400 MG tablet Take 1 tablet (400 mg total) by mouth every 6 (six) hours as needed for moderate pain. 06/15/17   Nita Sells, MD  ipratropium-albuterol (DUONEB) 0.5-2.5 (3) MG/3ML SOLN Take 3 mLs by nebulization every 6 (six) hours as needed. Patient taking differently: Take 3 mLs by nebulization every 6 (six) hours as needed (wheezing).  03/16/17   Debbe Odea, MD  metoprolol tartrate (LOPRESSOR) 25 MG tablet Take 0.5 tablets (12.5 mg total) by mouth 2 (two) times daily. 08/12/16    Florencia Reasons, MD  mirabegron ER (MYRBETRIQ) 50 MG TB24 tablet Take 50 mg by mouth daily.    [provider]  mometasone-formoterol (DULERA) 200-5 MCG/ACT AERO Inhale 1 puff into the lungs 2 (two) times daily.    [provider]  montelukast (SINGULAIR) 10 MG tablet Take 10 mg by mouth at bedtime.      [provider]  ondansetron (ZOFRAN) 4 MG tablet Take 4 mg by mouth every 8 (eight) hours as needed for nausea or vomiting.    [provider]  polyethylene glycol (MIRALAX / GLYCOLAX) packet Take 17 g by mouth daily as  needed for mild constipation. 07/12/15   Perkins, Alexzandrew L, PA-C  Propylene Glycol-Glycerin (SOOTHE) 0.6-0.6 % SOLN Apply 1 drop to eye 2 (two) times daily. 11/28/15   Domenic Moras, PA-C  senna (SENOKOT) 8.6 MG TABS tablet Take 1 tablet by mouth daily as needed for mild constipation.    [provider]  sodium phosphate (FLEET) 7-19 GM/118ML ENEM Place 1 enema rectally daily as needed for severe constipation.    [provider]    Family History Family History  Problem Relation Age of Onset  . Diabetes Father   . Colon cancer Father 67  . Heart failure Father   . Arthritis Mother   . Thyroid disease Mother   . Osteoporosis Mother   . Diabetes Brother   . Diabetes Brother   . Osteoporosis Maternal Aunt   . Osteoporosis Maternal Grandmother     Social History Social History   Tobacco Use  . Smoking status: Former Smoker    Packs/day: 0.25    Years: 10.00    Pack years: 2.50    Types: Cigarettes    Quit date: 04/24/1990    Years since quitting: 28.4  . Smokeless tobacco: Never Used  Substance Use Topics  . Alcohol use: Yes    Alcohol/week: 4.0 standard drinks    Types: 4 Glasses of wine per week    Comment: occ  . Drug use: No     Allergies   Oxycodone hcl [oxycodone hcl], Morphine sulfate, and Sulfamethoxazole   Review of Systems Review of Systems  All other systems reviewed and are negative.     Physical Exam Updated Vital Signs BP 114/80 (BP Location: Left Arm)   Pulse 82   Temp 98.1 F (36.7 C) (Oral)   Resp 18   SpO2 93%   Physical Exam Vitals signs and nursing note reviewed.  Constitutional:      General: She is not in acute distress.    Appearance: She is well-developed.  HENT:     Head: Normocephalic and atraumatic.  Eyes:     Conjunctiva/sclera: Conjunctivae normal.  Neck:     Musculoskeletal: Neck supple.  Cardiovascular:     Rate and Rhythm: Normal rate and regular rhythm.     Heart sounds: No murmur.  Pulmonary:     Effort: Pulmonary effort is normal. No respiratory distress.     Breath sounds: Normal breath sounds.  Abdominal:     Palpations: Abdomen is soft.     Tenderness: There is no abdominal tenderness.  Skin:    General: Skin is warm and dry.  Neurological:     Mental Status: She is alert and oriented to person, place, and time.  Psychiatric:        Mood and Affect: Mood normal.        Behavior: Behavior normal.      ED Treatments / Results  Labs (all labs ordered are listed, but only abnormal results are displayed) Labs Reviewed  SARS CORONAVIRUS 2 (HOSPITAL ORDER, [facility])  CBC WITH DIFFERENTIAL/PLATELET  BASIC METABOLIC PANEL  URINALYSIS, ROUTINE W REFLEX MICROSCOPIC  BRAIN NATRIURETIC PEPTIDE    EKG None  Radiology No results found.  Procedures Procedures (including critical care time)  Medications Ordered in ED Medications - No data to display   Initial Impression / Assessment and Plan / ED Course  I have reviewed the triage vital signs and the nursing notes.  Pertinent labs & imaging results that were available during my  care of the patient were reviewed by me and considered in my medical decision making (see chart for details).        Patient brought to emergency department from nursing home for confusion.  Patient has not been acting herself.  She has also had some cough.   Patient is afebrile.  She is not tachycardic.  She does have a O2 saturation of 89 to 90% on room air, will supplement with 2 L nasal cannula.  Chest x-ray is negative.  Coronavirus test negative.  Labs delayed due to lost sample.  Instructed nurses to recollect.  They are working on this.  Leukocytosis to 25.9.    UA in process.    Patient did not initially meet sepsis criteria.    Patient seen by and discussed with Dr. Leonides Schanz.   Appreciate Dr. Hal Hope for admitting the patient.  Final Clinical Impressions(s) / ED Diagnoses   Final diagnoses:  Confusion  Acute cystitis without hematuria    ED Discharge Orders    None       Montine Circle, PA-C 09/29/18 0407    Lacretia Leigh, MD 10/01/18 [phone]

## 2018-09-28 NOTE — ED Triage Notes (Signed)
Patient arrived via EMS from [place] with complaints of AMS and SOB starting this morning. Nursing home noted that patient was less responsive than normal. Patient experienced tachypnea with EMS and was placed on 2 L of O2 because SpO2 was 93% on RA. Patient is not on oxygen at baseline. Patient is A&O x4 with EMS.

## 2018-09-29 ENCOUNTER — Inpatient Hospital Stay (HOSPITAL_COMMUNITY): Payer: Medicare Other

## 2018-09-29 ENCOUNTER — Encounter (HOSPITAL_COMMUNITY): Payer: Self-pay | Admitting: Internal Medicine

## 2018-09-29 DIAGNOSIS — G9341 Metabolic encephalopathy: Secondary | ICD-10-CM | POA: Diagnosis present

## 2018-09-29 DIAGNOSIS — K5730 Diverticulosis of large intestine without perforation or abscess without bleeding: Secondary | ICD-10-CM | POA: Diagnosis present

## 2018-09-29 DIAGNOSIS — Z7901 Long term (current) use of anticoagulants: Secondary | ICD-10-CM | POA: Diagnosis not present

## 2018-09-29 DIAGNOSIS — Z79890 Hormone replacement therapy (postmenopausal): Secondary | ICD-10-CM | POA: Diagnosis not present

## 2018-09-29 DIAGNOSIS — J9621 Acute and chronic respiratory failure with hypoxia: Secondary | ICD-10-CM | POA: Diagnosis present

## 2018-09-29 DIAGNOSIS — Z79899 Other long term (current) drug therapy: Secondary | ICD-10-CM | POA: Diagnosis not present

## 2018-09-29 DIAGNOSIS — G9340 Encephalopathy, unspecified: Secondary | ICD-10-CM | POA: Diagnosis not present

## 2018-09-29 DIAGNOSIS — Z993 Dependence on wheelchair: Secondary | ICD-10-CM | POA: Diagnosis not present

## 2018-09-29 DIAGNOSIS — Z96641 Presence of right artificial hip joint: Secondary | ICD-10-CM | POA: Diagnosis present

## 2018-09-29 DIAGNOSIS — J441 Chronic obstructive pulmonary disease with (acute) exacerbation: Secondary | ICD-10-CM | POA: Diagnosis present

## 2018-09-29 DIAGNOSIS — J9601 Acute respiratory failure with hypoxia: Secondary | ICD-10-CM | POA: Diagnosis not present

## 2018-09-29 DIAGNOSIS — Z7982 Long term (current) use of aspirin: Secondary | ICD-10-CM | POA: Diagnosis not present

## 2018-09-29 DIAGNOSIS — N3000 Acute cystitis without hematuria: Secondary | ICD-10-CM | POA: Diagnosis not present

## 2018-09-29 DIAGNOSIS — Z9851 Tubal ligation status: Secondary | ICD-10-CM | POA: Diagnosis not present

## 2018-09-29 DIAGNOSIS — Z20828 Contact with and (suspected) exposure to other viral communicable diseases: Secondary | ICD-10-CM | POA: Diagnosis present

## 2018-09-29 DIAGNOSIS — G4700 Insomnia, unspecified: Secondary | ICD-10-CM | POA: Diagnosis present

## 2018-09-29 DIAGNOSIS — I10 Essential (primary) hypertension: Secondary | ICD-10-CM

## 2018-09-29 DIAGNOSIS — R410 Disorientation, unspecified: Secondary | ICD-10-CM | POA: Diagnosis present

## 2018-09-29 DIAGNOSIS — B9620 Unspecified Escherichia coli [E. coli] as the cause of diseases classified elsewhere: Secondary | ICD-10-CM | POA: Diagnosis present

## 2018-09-29 DIAGNOSIS — Z66 Do not resuscitate: Secondary | ICD-10-CM | POA: Diagnosis present

## 2018-09-29 DIAGNOSIS — J45901 Unspecified asthma with (acute) exacerbation: Secondary | ICD-10-CM | POA: Diagnosis present

## 2018-09-29 DIAGNOSIS — Z981 Arthrodesis status: Secondary | ICD-10-CM | POA: Diagnosis not present

## 2018-09-29 DIAGNOSIS — M1990 Unspecified osteoarthritis, unspecified site: Secondary | ICD-10-CM | POA: Diagnosis present

## 2018-09-29 DIAGNOSIS — F329 Major depressive disorder, single episode, unspecified: Secondary | ICD-10-CM | POA: Diagnosis present

## 2018-09-29 DIAGNOSIS — E559 Vitamin D deficiency, unspecified: Secondary | ICD-10-CM | POA: Diagnosis present

## 2018-09-29 DIAGNOSIS — N390 Urinary tract infection, site not specified: Secondary | ICD-10-CM | POA: Diagnosis present

## 2018-09-29 DIAGNOSIS — R1310 Dysphagia, unspecified: Secondary | ICD-10-CM | POA: Diagnosis present

## 2018-09-29 DIAGNOSIS — M8580 Other specified disorders of bone density and structure, unspecified site: Secondary | ICD-10-CM | POA: Diagnosis present

## 2018-09-29 LAB — CBC WITH DIFFERENTIAL/PLATELET
Abs Immature Granulocytes: 0.22 10*3/uL — ABNORMAL HIGH (ref 0.00–0.07)
Abs Immature Granulocytes: 0.39 10*3/uL — ABNORMAL HIGH (ref 0.00–0.07)
Basophils Absolute: 0 10*3/uL (ref 0.0–0.1)
Basophils Absolute: 0 10*3/uL (ref 0.0–0.1)
Basophils Relative: 0 %
Basophils Relative: 0 %
Eosinophils Absolute: 0 10*3/uL (ref 0.0–0.5)
Eosinophils Absolute: 0 10*3/uL (ref 0.0–0.5)
Eosinophils Relative: 0 %
Eosinophils Relative: 0 %
HCT: 42.5 % (ref 36.0–46.0)
HCT: 44.2 % (ref 36.0–46.0)
Hemoglobin: 13.7 g/dL (ref 12.0–15.0)
Hemoglobin: 13.9 g/dL (ref 12.0–15.0)
Immature Granulocytes: 1 %
Immature Granulocytes: 1 %
Lymphocytes Relative: 2 %
Lymphocytes Relative: 2 %
Lymphs Abs: 0.4 10*3/uL — ABNORMAL LOW (ref 0.7–4.0)
Lymphs Abs: 0.6 10*3/uL — ABNORMAL LOW (ref 0.7–4.0)
MCH: 32.3 pg (ref 26.0–34.0)
MCH: 31.7 pg (ref 26.0–34.0)
MCHC: 32.2 g/dL (ref 30.0–36.0)
MCHC: 31.4 g/dL (ref 30.0–36.0)
MCV: 100.2 fL — ABNORMAL HIGH (ref 80.0–100.0)
MCV: 100.9 fL — ABNORMAL HIGH (ref 80.0–100.0)
Monocytes Absolute: 0.6 10*3/uL (ref 0.1–1.0)
Monocytes Absolute: 1.5 10*3/uL — ABNORMAL HIGH (ref 0.1–1.0)
Monocytes Relative: 2 %
Monocytes Relative: 5 %
Neutro Abs: 24.7 10*3/uL — ABNORMAL HIGH (ref 1.7–7.7)
Neutro Abs: 27.3 10*3/uL — ABNORMAL HIGH (ref 1.7–7.7)
Neutrophils Relative %: 95 %
Neutrophils Relative %: 92 %
Platelets: 231 10*3/uL (ref 150–400)
Platelets: 273 10*3/uL (ref 150–400)
RBC: 4.24 MIL/uL (ref 3.87–5.11)
RBC: 4.38 MIL/uL (ref 3.87–5.11)
RDW: 13.9 % (ref 11.5–15.5)
RDW: 13.9 % (ref 11.5–15.5)
WBC: 25.9 10*3/uL — ABNORMAL HIGH (ref 4.0–10.5)
WBC: 29.8 10*3/uL — ABNORMAL HIGH (ref 4.0–10.5)
nRBC: 0 % (ref 0.0–0.2)
nRBC: 0 % (ref 0.0–0.2)

## 2018-09-29 LAB — APTT: aPTT: 42 seconds — ABNORMAL HIGH (ref 24–36)

## 2018-09-29 LAB — BASIC METABOLIC PANEL
Anion gap: 10 (ref 5–15)
BUN: 20 mg/dL (ref 8–23)
CO2: 23 mmol/L (ref 22–32)
Calcium: 9.1 mg/dL (ref 8.9–10.3)
Chloride: 105 mmol/L (ref 98–111)
Creatinine, Ser: 1.03 mg/dL — ABNORMAL HIGH (ref 0.44–1.00)
GFR calc Af Amer: 57 mL/min — ABNORMAL LOW (ref >60)
GFR calc non Af Amer: 49 mL/min — ABNORMAL LOW (ref >60)
Glucose, Bld: 167 mg/dL — ABNORMAL HIGH (ref 70–99)
Potassium: 3.8 mmol/L (ref 3.5–5.1)
Sodium: 138 mmol/L (ref 135–145)

## 2018-09-29 LAB — URINALYSIS, ROUTINE W REFLEX MICROSCOPIC
Bilirubin Urine: NEGATIVE
Glucose, UA: NEGATIVE mg/dL
Ketones, ur: NEGATIVE mg/dL
Nitrite: NEGATIVE
Protein, ur: 100 mg/dL — AB
Specific Gravity, Urine: 1.011 (ref 1.005–1.030)
WBC, UA: >50 WBC/hpf — ABNORMAL HIGH (ref 0–5)
pH: 5 (ref 5.0–8.0)

## 2018-09-29 LAB — COMPREHENSIVE METABOLIC PANEL
ALT: 18 U/L (ref 0–44)
AST: 27 U/L (ref 15–41)
Albumin: 3.2 g/dL — ABNORMAL LOW (ref 3.5–5.0)
Alkaline Phosphatase: 98 U/L (ref 38–126)
Anion gap: 12 (ref 5–15)
BUN: 23 mg/dL (ref 8–23)
CO2: 24 mmol/L (ref 22–32)
Calcium: 9.5 mg/dL (ref 8.9–10.3)
Chloride: 102 mmol/L (ref 98–111)
Creatinine, Ser: 0.91 mg/dL (ref 0.44–1.00)
GFR calc Af Amer: >60 mL/min (ref >60)
GFR calc non Af Amer: 57 mL/min — ABNORMAL LOW (ref >60)
Glucose, Bld: 108 mg/dL — ABNORMAL HIGH (ref 70–99)
Potassium: 3.9 mmol/L (ref 3.5–5.1)
Sodium: 138 mmol/L (ref 135–145)
Total Bilirubin: 0.4 mg/dL (ref 0.3–1.2)
Total Protein: 7 g/dL (ref 6.5–8.1)

## 2018-09-29 LAB — LACTIC ACID, PLASMA: Lactic Acid, Venous: 1.2 mmol/L (ref 0.5–1.9)

## 2018-09-29 LAB — PROTIME-INR
INR: 1.8 — ABNORMAL HIGH (ref 0.8–1.2)
Prothrombin Time: 20.3 seconds — ABNORMAL HIGH (ref 11.4–15.2)

## 2018-09-29 LAB — SARS CORONAVIRUS 2 BY RT PCR (HOSPITAL ORDER, PERFORMED IN ~~LOC~~ HOSPITAL LAB): SARS Coronavirus 2: NEGATIVE

## 2018-09-29 LAB — BRAIN NATRIURETIC PEPTIDE: B Natriuretic Peptide: 223.7 pg/mL — ABNORMAL HIGH (ref 0.0–100.0)

## 2018-09-29 MED ORDER — LACTATED RINGERS IV SOLN
INTRAVENOUS | Status: AC
  Administered 2018-09-29 – 2018-09-30 (×2): via INTRAVENOUS

## 2018-09-29 MED ORDER — MIRTAZAPINE 15 MG PO TABS
30.0000 mg | ORAL_TABLET | Freq: Every day | ORAL | Status: DC
  Administered 2018-09-29 – 2018-10-01 (×3): 30 mg via ORAL
  Filled 2018-09-29 (×3): qty 2

## 2018-09-29 MED ORDER — ESCITALOPRAM OXALATE 10 MG PO TABS
10.0000 mg | ORAL_TABLET | Freq: Every day | ORAL | Status: DC
  Administered 2018-09-30 – 2018-10-02 (×3): 10 mg via ORAL
  Filled 2018-09-29 (×3): qty 1

## 2018-09-29 MED ORDER — POLYVINYL ALCOHOL 1.4 % OP SOLN
1.0000 [drp] | Freq: Two times a day (BID) | OPHTHALMIC | Status: DC
  Administered 2018-09-29 – 2018-10-02 (×6): 1 [drp] via OPHTHALMIC
  Filled 2018-09-29: qty 15

## 2018-09-29 MED ORDER — ACETAMINOPHEN 325 MG PO TABS
650.0000 mg | ORAL_TABLET | Freq: Four times a day (QID) | ORAL | Status: DC | PRN
  Administered 2018-09-29: 650 mg via ORAL
  Filled 2018-09-29: qty 2

## 2018-09-29 MED ORDER — IPRATROPIUM-ALBUTEROL 0.5-2.5 (3) MG/3ML IN SOLN
3.0000 mL | RESPIRATORY_TRACT | Status: DC | PRN
  Filled 2018-09-29: qty 3

## 2018-09-29 MED ORDER — PROPYLENE GLYCOL-GLYCERIN 0.6-0.6 % OP SOLN: 1.0000 [drp] | Freq: Two times a day (BID) | OPHTHALMIC | Status: DC

## 2018-09-29 MED ORDER — IPRATROPIUM-ALBUTEROL 0.5-2.5 (3) MG/3ML IN SOLN
3.0000 mL | Freq: Four times a day (QID) | RESPIRATORY_TRACT | Status: DC
  Administered 2018-09-30: 3 mL via RESPIRATORY_TRACT
  Filled 2018-09-29: qty 3

## 2018-09-29 MED ORDER — SODIUM CHLORIDE 0.9 % IV SOLN
1.0000 g | Freq: Once | INTRAVENOUS | Status: AC
  Administered 2018-09-29: 1 g via INTRAVENOUS
  Filled 2018-09-29: qty 10

## 2018-09-29 MED ORDER — FLUTICASONE PROPIONATE 50 MCG/ACT NA SUSP
1.0000 | Freq: Two times a day (BID) | NASAL | Status: DC
  Administered 2018-09-29 – 2018-10-02 (×6): 1 via NASAL
  Filled 2018-09-29: qty 16

## 2018-09-29 MED ORDER — METHYLPREDNISOLONE SODIUM SUCC 125 MG IJ SOLR
60.0000 mg | Freq: Three times a day (TID) | INTRAMUSCULAR | Status: AC
  Administered 2018-09-29 – 2018-09-30 (×4): 60 mg via INTRAVENOUS
  Filled 2018-09-29 (×5): qty 2

## 2018-09-29 MED ORDER — SENNA 8.6 MG PO TABS: 1.0000 | ORAL_TABLET | Freq: Every day | ORAL | Status: DC | PRN

## 2018-09-29 MED ORDER — MONTELUKAST SODIUM 10 MG PO TABS
10.0000 mg | ORAL_TABLET | Freq: Every day | ORAL | Status: DC
  Administered 2018-09-29 – 2018-10-01 (×3): 10 mg via ORAL
  Filled 2018-09-29 (×3): qty 1

## 2018-09-29 MED ORDER — IPRATROPIUM-ALBUTEROL 0.5-2.5 (3) MG/3ML IN SOLN
3.0000 mL | Freq: Four times a day (QID) | RESPIRATORY_TRACT | Status: DC
  Administered 2018-09-29 (×2): 3 mL via RESPIRATORY_TRACT
  Filled 2018-09-29 (×2): qty 3

## 2018-09-29 MED ORDER — METOPROLOL TARTRATE 25 MG PO TABS
12.5000 mg | ORAL_TABLET | Freq: Two times a day (BID) | ORAL | Status: DC
  Administered 2018-09-29 – 2018-10-02 (×6): 12.5 mg via ORAL
  Filled 2018-09-29 (×6): qty 1

## 2018-09-29 MED ORDER — MOMETASONE FURO-FORMOTEROL FUM 200-5 MCG/ACT IN AERO
1.0000 | INHALATION_SPRAY | Freq: Two times a day (BID) | RESPIRATORY_TRACT | Status: DC
  Filled 2018-09-29: qty 8.8

## 2018-09-29 MED ORDER — GUAIFENESIN ER 600 MG PO TB12
600.0000 mg | ORAL_TABLET | Freq: Two times a day (BID) | ORAL | Status: DC
  Administered 2018-09-29 – 2018-10-02 (×6): 600 mg via ORAL
  Filled 2018-09-29 (×6): qty 1

## 2018-09-29 MED ORDER — SODIUM CHLORIDE 0.9 % IV SOLN
1.0000 g | INTRAVENOUS | Status: DC
  Administered 2018-09-30 – 2018-10-02 (×3): 1 g via INTRAVENOUS
  Filled 2018-09-29 (×3): qty 1

## 2018-09-29 MED ORDER — BUDESONIDE 0.25 MG/2ML IN SUSP
0.2500 mg | Freq: Two times a day (BID) | RESPIRATORY_TRACT | Status: DC
  Administered 2018-09-29 – 2018-10-02 (×6): 0.25 mg via RESPIRATORY_TRACT
  Filled 2018-09-29 (×6): qty 2

## 2018-09-29 MED ORDER — LABETALOL HCL 5 MG/ML IV SOLN
5.0000 mg | INTRAVENOUS | Status: DC | PRN
  Administered 2018-09-29 – 2018-09-30 (×2): 5 mg via INTRAVENOUS
  Filled 2018-09-29 (×3): qty 4

## 2018-09-29 MED ORDER — HYDRALAZINE HCL 20 MG/ML IJ SOLN
5.0000 mg | INTRAMUSCULAR | Status: DC | PRN
  Administered 2018-10-02: 5 mg via INTRAVENOUS
  Filled 2018-09-29 (×2): qty 1

## 2018-09-29 MED ORDER — ACETAMINOPHEN 650 MG RE SUPP: 650.0000 mg | Freq: Four times a day (QID) | RECTAL | Status: DC | PRN

## 2018-09-29 MED ORDER — MELATONIN 3 MG PO TABS
3.0000 mg | ORAL_TABLET | Freq: Every day | ORAL | Status: DC
  Administered 2018-09-29 – 2018-10-01 (×3): 3 mg via ORAL
  Filled 2018-09-29 (×4): qty 1

## 2018-09-29 MED ORDER — BUPROPION HCL ER (XL) 300 MG PO TB24
300.0000 mg | ORAL_TABLET | ORAL | Status: DC
  Administered 2018-09-30 – 2018-10-02 (×3): 300 mg via ORAL
  Filled 2018-09-29 (×3): qty 1

## 2018-09-29 MED ORDER — POLYETHYLENE GLYCOL 3350 17 G PO PACK: 17.0000 g | PACK | Freq: Every day | ORAL | Status: DC | PRN

## 2018-09-29 MED ORDER — VANCOMYCIN HCL 10 G IV SOLR
1250.0000 mg | INTRAVENOUS | Status: DC
  Administered 2018-09-30: 19:00:00 1250 mg via INTRAVENOUS
  Filled 2018-09-29 (×2): qty 1250

## 2018-09-29 MED ORDER — METHOCARBAMOL 500 MG PO TABS: 500.0000 mg | ORAL_TABLET | Freq: Three times a day (TID) | ORAL | Status: DC | PRN

## 2018-09-29 MED ORDER — VANCOMYCIN HCL 10 G IV SOLR
1750.0000 mg | INTRAVENOUS | Status: AC
  Administered 2018-09-29: 1750 mg via INTRAVENOUS
  Filled 2018-09-29: qty 1750

## 2018-09-29 MED ORDER — CALCIUM CARBONATE-VITAMIN D 500-200 MG-UNIT PO TABS
1.0000 | ORAL_TABLET | Freq: Every day | ORAL | Status: DC
  Administered 2018-09-30 – 2018-10-02 (×3): 1 via ORAL
  Filled 2018-09-29 (×3): qty 1

## 2018-09-29 MED ORDER — ALBUTEROL SULFATE HFA 108 (90 BASE) MCG/ACT IN AERS: 2.0000 | INHALATION_SPRAY | RESPIRATORY_TRACT | Status: DC | PRN

## 2018-09-29 MED ORDER — BENZONATATE 100 MG PO CAPS: 200.0000 mg | ORAL_CAPSULE | Freq: Three times a day (TID) | ORAL | Status: DC | PRN

## 2018-09-29 MED ORDER — ONDANSETRON HCL 4 MG PO TABS: 4.0000 mg | ORAL_TABLET | Freq: Four times a day (QID) | ORAL | Status: DC | PRN

## 2018-09-29 MED ORDER — MIRABEGRON ER 25 MG PO TB24
50.0000 mg | ORAL_TABLET | Freq: Every day | ORAL | Status: DC
  Administered 2018-09-30 – 2018-10-02 (×3): 50 mg via ORAL
  Filled 2018-09-29 (×3): qty 2

## 2018-09-29 MED ORDER — APIXABAN 5 MG PO TABS
5.0000 mg | ORAL_TABLET | Freq: Two times a day (BID) | ORAL | Status: DC
  Administered 2018-09-29 – 2018-10-02 (×7): 5 mg via ORAL
  Filled 2018-09-29 (×8): qty 1

## 2018-09-29 MED ORDER — TRAMADOL HCL 50 MG PO TABS: 50.0000 mg | ORAL_TABLET | Freq: Four times a day (QID) | ORAL | Status: DC | PRN

## 2018-09-29 MED ORDER — IPRATROPIUM-ALBUTEROL 0.5-2.5 (3) MG/3ML IN SOLN: 3.0000 mL | RESPIRATORY_TRACT | Status: DC | PRN

## 2018-09-29 MED ORDER — ALBUTEROL SULFATE (2.5 MG/3ML) 0.083% IN NEBU
2.5000 mg | INHALATION_SOLUTION | RESPIRATORY_TRACT | Status: DC | PRN
  Filled 2018-09-29: qty 3

## 2018-09-29 MED ORDER — ASPIRIN EC 325 MG PO TBEC
325.0000 mg | DELAYED_RELEASE_TABLET | Freq: Every day | ORAL | Status: DC
  Administered 2018-09-30 – 2018-10-02 (×3): 325 mg via ORAL
  Filled 2018-09-29 (×3): qty 1

## 2018-09-29 MED ORDER — ONDANSETRON HCL 4 MG/2ML IJ SOLN: 4.0000 mg | Freq: Four times a day (QID) | INTRAMUSCULAR | Status: DC | PRN

## 2018-09-29 NOTE — ED Notes (Signed)
Jessia Kief RN and Lovena Le RN attempted to collect blood cultures and labs on patient and was unsuccessful. One blue top blood culture sent down. Phlebotomy contacted to draw labs. Will continue to monitor patient.

## 2018-09-29 NOTE — ED Notes (Signed)
Recollected blood and sent to lab. Will continue to monitor patient.

## 2018-09-29 NOTE — Treatment Plan (Signed)
Asked to check on pt at bedside to re-evaluate patient as pt noted to be febrile with increased resp effort and increased O2 requirement to [facility]. When seen, pt was awake, conversant, sitting up in bed speaking in 4-6 word sentences with audible wheezing from doorway. Decreased BS on exam with wheezing and increased resp effort. Have increased duoneb to Q6h/Q2PRN and added IV solumedrol q8hrs.  Pt noted to have temp 101F axillary. STAT CXR ordered and reviewed, clear per my read. Given concern of sepsis, have added vanc for additional antibiotic coverage.  HR noted to be in the 130's with BP suboptimally controlled. Have added PRN IV hydralazine for bp control as well as PRN IV labetalol for HR>120 with hold parameters.  Following duoneb tx in room, pt's respiratory status seems to have improved. Have since updated Dr. Carles Collet.

## 2018-09-29 NOTE — Sepsis Progress Note (Signed)
Spoke with ED RN.  Sepsis bundle requirements difficult to meet  due to inability to obtain labs. Patient is difficult stick and IV will not draw back. Rocephin was given.

## 2018-09-29 NOTE — H&P (Signed)
History and Physical    [name] TML:[id number] DOB: 1930-06-14 DOA: 09/28/2018  PCP: Reynold Bowen, MD  Patient coming from: [place].  History obtained from patient's and previous records and ER physician and patient.  Chief Complaint: Fever chills and shortness of breath.  HPI: [name] is a 85 y.o. female with history of COPD/asthma, hypertension history of breast cancer in remission depression prediabetic was brought to the ER after patient was found to be having fever chills and shortness of breath as per the patient's son.  No history of any nausea vomiting abdominal pain or diarrhea.  Patient states she does have some choking spell which has been chronic and I have confirmed with patient's son that patient did have multiple work-up for dysphagia and presently plan is to have no further work-up on it.  ED Course: In the ER initially patient was found to be mildly confused as per the ER physician was hypoxic requiring 2 L oxygen for maintaining sats.  Patient was afebrile.  Chest x-ray unremarkable UA shows features consistent with UTI.  Lab work showed leukocytosis of 25,000.  Patient was started on empiric antibiotics for UTI and admitted for further observation.  Review of Systems: As per HPI, rest all negative.   Past Medical History:  Diagnosis Date   Arthritis    Asthma    Breast cancer ([facility]) 2006   right w/lumpectomy   COPD (chronic obstructive pulmonary disease) ([place])    Depression    Diverticulosis of colon    Endometrial hyperplasia 01/1996   fibroids, adenomyosis   Fall 02/2017   Frequent UTI    History of colon polyps    Hypertension    Insomnia    Osteopenia 12/08   hip   Pre-diabetes    Spinal stenosis 2001   Vitamin D deficiency     Past Surgical History:  Procedure Laterality Date   BILATERAL SALPINGOOPHORECTOMY  12/97   BREAST LUMPECTOMY Right 03/2003   CARPAL TUNNEL RELEASE     CERVICAL FUSION      CERVICAL LAMINECTOMY  1975   x4   CHOLECYSTECTOMY  11/00   lap chole   HYSTEROSCOPY  10/97   D&C, postmenopausal bleeding   LUMBAR LAMINECTOMY  10/06   with spinal stenosis   manipulation of hip for dislocation     ROTATOR CUFF REPAIR Right 11/96   TONSILLECTOMY AND ADENOIDECTOMY     TOTAL ABDOMINAL HYSTERECTOMY  12/97   endo hyperplasia, fibroids, adenomyosis   TOTAL HIP ARTHROPLASTY Right 8/00   TOTAL HIP REVISION Right 07/10/2015   Procedure: RIGHT ACETABULAR VS TOTAL HIP REVISION;  Surgeon: Gaynelle Arabian, MD;  Location: [facility];  Service: Orthopedics;  Laterality: Right;   TOTAL KNEE ARTHROPLASTY Left 06/2003   TOTAL KNEE ARTHROPLASTY Right 10/06   TUBAL LIGATION Bilateral 1968     reports that she quit smoking about 28 years ago. Her smoking use included cigarettes. She has a 2.50 pack-year smoking history. She has never used smokeless tobacco. She reports current alcohol use of about 4.0 standard drinks of alcohol per week. She reports that she does not use drugs.  Allergies  Allergen Reactions   Oxycodone Hcl [Oxycodone Hcl] Rash   Morphine Sulfate Other (See Comments)    REACTION: very anxious   Sulfamethoxazole Other (See Comments)    REACTION: unspecified    Family History  Problem Relation Age of Onset   Diabetes Father    Colon cancer Father 33   Heart  failure Father    Arthritis Mother    Thyroid disease Mother    Osteoporosis Mother    Diabetes Brother    Diabetes Brother    Osteoporosis Maternal Aunt    Osteoporosis Maternal Grandmother     Prior to Admission medications   Medication Sig Start Date End Date Taking? Authorizing Provider  acetaminophen (TYLENOL) 650 MG CR tablet Take 1,300 mg by mouth every 8 (eight) hours as needed for pain.   Yes [provider]  albuterol (ACCUNEB) 0.63 MG/3ML nebulizer solution Take 3 mLs (0.63 mg total) by nebulization 4 (four) times daily. 03/24/17  Yes Debbe Odea, MD  albuterol  (PROAIR HFA) 108 (90 BASE) MCG/ACT inhaler Inhale 2 puffs into the lungs every 4 (four) hours as needed for shortness of breath.    Yes [provider]  apixaban (ELIQUIS) 5 MG TABS tablet Take 1 tablet (5 mg total) by mouth 2 (two) times daily. 04/10/17  Yes Nita Sells, MD  Benzocaine-Menthol (CEPACOL SORE THROAT) 15-2.6 MG LOZG Use as directed 1 lozenge in the mouth or throat every 4 (four) hours as needed (for sore throat).   Yes [provider]  benzonatate (TESSALON) 200 MG capsule Take 200 mg by mouth 3 (three) times daily as needed for cough.   Yes [provider]  buPROPion (WELLBUTRIN XL) 300 MG 24 hr tablet Take 1 tablet (300 mg total) by mouth every morning. 04/04/17  Yes Nita Sells, MD  Calcium Carb-Cholecalciferol (CALCIUM+D3) 600-800 MG-UNIT TABS Take 1 tablet by mouth daily.   Yes [provider]  escitalopram (LEXAPRO) 10 MG tablet Take 1 tablet (10 mg total) by mouth daily. 04/04/17  Yes Nita Sells, MD  fluticasone (FLONASE) 50 MCG/ACT nasal spray Place 1 spray into both nostrils 2 (two) times daily.   Yes [provider]  guaiFENesin (MUCINEX) 600 MG 12 hr tablet Take 600 mg by mouth 2 (two) times daily.   Yes [provider]  ibuprofen (ADVIL,MOTRIN) 400 MG tablet Take 1 tablet (400 mg total) by mouth every 6 (six) hours as needed for moderate pain. 06/15/17  Yes Nita Sells, MD  loratadine (CLARITIN) 10 MG tablet Take 10 mg by mouth daily.   Yes [provider]  Melatonin 3 MG TABS Take 3 mg by mouth at bedtime.   Yes [provider]  Menthol, Topical Analgesic, (BIOFREEZE) 4 % GEL Apply 1 application topically 3 (three) times daily as needed (pain).   Yes [provider]  methocarbamol (ROBAXIN) 500 MG tablet Take 500 mg by mouth every 8 (eight) hours as needed for muscle spasms.   Yes [provider]  metoprolol tartrate (LOPRESSOR) 25 MG tablet Take 0.5  tablets (12.5 mg total) by mouth 2 (two) times daily. 08/12/16  Yes Florencia Reasons, MD  mirabegron ER (MYRBETRIQ) 50 MG TB24 tablet Take 50 mg by mouth daily.   Yes [provider]  mirtazapine (REMERON) 30 MG tablet Take 30 mg by mouth at bedtime.   Yes [provider]  mometasone-formoterol (DULERA) 200-5 MCG/ACT AERO Inhale 1 puff into the lungs 2 (two) times daily.   Yes [provider]  montelukast (SINGULAIR) 10 MG tablet Take 10 mg by mouth at bedtime.     Yes [provider]  nystatin (NYSTATIN) powder Apply 1 g topically 2 (two) times daily.   Yes [provider]  ondansetron (ZOFRAN) 4 MG tablet Take 4 mg by mouth every 8 (eight) hours as needed for nausea or vomiting.  Yes [provider]  polyethylene glycol (MIRALAX / GLYCOLAX) packet Take 17 g by mouth daily as needed for mild constipation. 07/12/15  Yes Perkins, Alexzandrew L, PA-C  Propylene Glycol-Glycerin (SOOTHE) 0.6-0.6 % SOLN Apply 1 drop to eye 2 (two) times daily. 11/28/15  Yes Domenic Moras, PA-C  senna (SENOKOT) 8.6 MG TABS tablet Take 1 tablet by mouth daily as needed for mild constipation.   Yes [provider]  traMADol (ULTRAM) 50 MG tablet Take 50 mg by mouth every 6 (six) hours as needed for moderate pain.   Yes [provider]  amoxicillin-clavulanate (AUGMENTIN) 875-125 MG tablet Take 1 tablet by mouth every 12 (twelve) hours. Patient not taking: Reported on 09/28/2018 06/15/17   Nita Sells, MD  aspirin EC 325 MG tablet Take 325 mg by mouth daily.     [provider]  food thickener (THICK IT) POWD Use as needed for thickening fluids Patient not taking: Reported on 09/28/2018 06/15/17   Nita Sells, MD  ipratropium-albuterol (DUONEB) 0.5-2.5 (3) MG/3ML SOLN Take 3 mLs by nebulization every 6 (six) hours as needed. Patient not taking: Reported on 09/28/2018 03/16/17   Debbe Odea, MD    Physical Exam: Constitutional: Moderately  built and nourished. Vitals:   09/29/18 0230 09/29/18 0330 09/29/18 0400 09/29/18 0600  BP: 111/72 118/79 109/79 132/74  Pulse: 81 94 90 99  Resp: (!) 23 (!) 28 (!) 23 (!) 22  Temp:      TempSrc:      SpO2: 96% 96% 96% 94%   Eyes: Anicteric no pallor. ENMT: No discharge from the ears eyes nose or mouth. Neck: No mass felt.  No neck rigidity. Respiratory: No rhonchi or crepitations. Cardiovascular: S1-S2 heard. Abdomen: Soft nontender bowel sounds present. Musculoskeletal: No edema. Skin: No rash. Neurologic: Alert awake oriented to place and person moves all extremities. Psychiatric: Oriented to place and person.   Labs on Admission: I have personally reviewed following labs and imaging studies  CBC: Recent Labs  Lab 09/29/18 0202  WBC 25.9*  NEUTROABS 24.7*  HGB 13.7  HCT 42.5  MCV 100.2*  PLT 161   Basic Metabolic Panel: Recent Labs  Lab 09/29/18 0202  NA 138  K 3.8  CL 105  CO2 23  GLUCOSE 167*  BUN 20  CREATININE 1.03*  CALCIUM 9.1   GFR: Estimated Creatinine Clearance: 46.2 mL/min (A) (by C-G formula based on SCr of 1.03 mg/dL (H)). Liver Function Tests: No results for input(s): AST, ALT, ALKPHOS, BILITOT, PROT, ALBUMIN in the last 168 hours. No results for input(s): LIPASE, AMYLASE in the last 168 hours. No results for input(s): AMMONIA in the last 168 hours. Coagulation Profile: No results for input(s): INR, PROTIME in the last 168 hours. Cardiac Enzymes: No results for input(s): CKTOTAL, CKMB, CKMBINDEX, TROPONINI in the last 168 hours. BNP (last 3 results) No results for input(s): PROBNP in the last 8760 hours. HbA1C: No results for input(s): HGBA1C in the last 72 hours. CBG: No results for input(s): GLUCAP in the last 168 hours. Lipid Profile: No results for input(s): CHOL, HDL, LDLCALC, TRIG, CHOLHDL, LDLDIRECT in the last 72 hours. Thyroid Function Tests: No results for input(s): TSH, T4TOTAL, FREET4, T3FREE, THYROIDAB in the last 72  hours. Anemia Panel: No results for input(s): VITAMINB12, FOLATE, FERRITIN, TIBC, IRON, RETICCTPCT in the last 72 hours. Urine analysis:    Component Value Date/Time   COLORURINE YELLOW 09/29/2018 0322   APPEARANCEUR HAZY (A) 09/29/2018 0322   LABSPEC 1.011 09/29/2018 0960  PHURINE 5.0 09/29/2018 0322   GLUCOSEU NEGATIVE 09/29/2018 0322   HGBUR SMALL (A) 09/29/2018 0322   BILIRUBINUR NEGATIVE 09/29/2018 0322   BILIRUBINUR n 09/25/2010 1525   KETONESUR NEGATIVE 09/29/2018 0322   PROTEINUR 100 (A) 09/29/2018 0322   UROBILINOGEN 0.2 09/25/2010 1525   UROBILINOGEN 0.2 01/04/2007 0900   NITRITE NEGATIVE 09/29/2018 0322   LEUKOCYTESUR LARGE (A) 09/29/2018 0322   Sepsis Labs: @LABRCNTIP (procalcitonin:4,lacticidven:4) ) Recent Results (from the past 240 hour(s))  SARS Coronavirus 2 [facility] order, Performed in [facility] hospital lab) Nasopharyngeal Nasopharyngeal Swab     Status: None   Collection Time: 09/28/18 11:22 PM   Specimen: Nasopharyngeal Swab  Result Value Ref Range Status   SARS Coronavirus 2 NEGATIVE NEGATIVE Final    Comment: (NOTE) If result is NEGATIVE SARS-CoV-2 target nucleic acids are NOT DETECTED. The SARS-CoV-2 RNA is generally detectable in upper and lower  respiratory specimens during the acute phase of infection. The lowest  concentration of SARS-CoV-2 viral copies this assay can detect is 250  copies / mL. A negative result does not preclude SARS-CoV-2 infection  and should not be used as the sole basis for treatment or other  patient management decisions.  A negative result may occur with  improper specimen collection / handling, submission of specimen other  than nasopharyngeal swab, presence of viral mutation(s) within the  areas targeted by this assay, and inadequate number of viral copies  (<250 copies / mL). A negative result must be combined with clinical  observations, patient history, and epidemiological information. If result is  POSITIVE SARS-CoV-2 target nucleic acids are DETECTED. The SARS-CoV-2 RNA is generally detectable in upper and lower  respiratory specimens dur ing the acute phase of infection.  Positive  results are indicative of active infection with SARS-CoV-2.  Clinical  correlation with patient history and other diagnostic information is  necessary to determine patient infection status.  Positive results do  not rule out bacterial infection or co-infection with other viruses. If result is PRESUMPTIVE POSTIVE SARS-CoV-2 nucleic acids MAY BE PRESENT.   A presumptive positive result was obtained on the submitted specimen  and confirmed on repeat testing.  While 2019 novel coronavirus  (SARS-CoV-2) nucleic acids may be present in the submitted sample  additional confirmatory testing may be necessary for epidemiological  and / or clinical management purposes  to differentiate between  SARS-CoV-2 and other Sarbecovirus currently known to infect humans.  If clinically indicated additional testing with an alternate test  methodology [phone]) is advised. The SARS-CoV-2 RNA is generally  detectable in upper and lower respiratory sp ecimens during the acute  phase of infection. The expected result is Negative. Fact Sheet for Patients:  StrictlyIdeas.no Fact Sheet for Healthcare Providers: BankingDealers.co.za This test is not yet approved or cleared by the [place] FDA and has been authorized for detection and/or diagnosis of SARS-CoV-2 by FDA under an Emergency Use Authorization (EUA).  This EUA will remain in effect (meaning this test can be used) for the duration of the COVID-19 declaration under Section 564(b)(1) of the Act, 21 U.S.C. section 360bbb-3(b)(1), unless the authorization is terminated or revoked sooner. Performed at [facility], [place] [address], [place], [place] [address]      Radiological Exams on Admission: Dg  Chest Port 1 View  Result Date: 09/28/2018 CLINICAL DATA:  Altered mental status, shortness of breath EXAM: PORTABLE CHEST 1 VIEW COMPARISON:  06/12/2017 FINDINGS: Mild left basilar opacity, likely atelectasis. Right lung is clear. No pleural  effusion or pneumothorax. The heart is normal in size. Cervical spine fixation hardware. IMPRESSION: No evidence of acute cardiopulmonary disease. Electronically Signed   By: Julian Hy M.D.   On: 09/28/2018 23:02    EKG: Independently reviewed.  Normal sinus rhythm.  Assessment/Plan Principal Problem:   Acute encephalopathy Active Problems:   Essential hypertension   Acute lower UTI   Acute on chronic respiratory failure with hypoxia (HCC)    1. Acute encephalopathy likely from UTI -by my exam patient is well oriented to her name and place and follows commands.  Essentially resolved at this time.  Continue ceftriaxone for UTI follow cultures given the markedly elevated leukocytosis. 2. Acute hypoxic respiratory failure requiring 2 L oxygen at this time.  Not in distress chest x-ray unremarkable.  Will closely observe.  Continue inhalers for COPD.  BNP is around 200.  No obvious signs of fluid overload.  If patient remains hypoxic may need Lasix. 3. Hypertension on metoprolol. 4. History of depression on Lexapro and bupropion. 5. History of pulmonary embolism on apixaban. 6. Chronic dysphagia -discussed with patient son is requesting no further work-up on it.   DVT prophylaxis: Apixaban. Code Status: DNR confirmed with patient's son. Family Communication: Patient's son. Disposition Plan: Back to nursing facility. Consults called: None. Admission status: Observation.   Rise Patience MD [facility] Pager [phone].  If 7PM-7AM, please contact night-coverage [email or web address] Password [facility]  09/29/2018, 7:41 AM

## 2018-09-29 NOTE — ED Notes (Signed)
ED TO INPATIENT HANDOFF REPORT  ED Nurse Name and Phone #: Christinia Gully Name/Age/Gender [name] 85 y.o. female Room/Bed: WA18/WA18  Code Status   Code Status: Prior  Home/SNF/Other Given to floor Patient oriented to: self and time Is this baseline? Yes   Triage Complete: Triage complete  Chief Complaint Altered Mental Status  Triage Note Patient arrived via EMS from [facility] with complaints of AMS and SOB starting this morning. Nursing home noted that patient was less responsive than normal. Patient experienced tachypnea with EMS and was placed on 2 L of O2 because SpO2 was 93% on RA. Patient is not on oxygen at baseline. Patient is A&O x4 with EMS.    Allergies Allergies  Allergen Reactions  . Oxycodone Hcl [Oxycodone Hcl] Rash  . Morphine Sulfate Other (See Comments)    REACTION: very anxious  . Sulfamethoxazole Other (See Comments)    REACTION: unspecified    Level of Care/Admitting Diagnosis ED Disposition    ED Disposition Condition Comment   Admit  Hospital Area: [place] [[id number]]  Level of Care: Telemetry [5]  Admit to tele based on following criteria: Monitor for Ischemic changes  Covid Evaluation: N/A  Diagnosis: Acute encephalopathy [174944]  Admitting Physician: Rise Patience [phone]  Attending Physician: Rise Patience [email or web address]  PT Class (Do Not Modify): Observation [104]  PT Acc Code (Do Not Modify): Observation [10022]       B Medical/Surgery History Past Medical History:  Diagnosis Date  . Arthritis   . Asthma   . Breast cancer ([place]) 2006   right w/lumpectomy  . COPD (chronic obstructive pulmonary disease) ([place])   . Depression   . Diverticulosis of colon   . Endometrial hyperplasia 01/1996   fibroids, adenomyosis  . Fall 02/2017  . Frequent UTI   . History of colon polyps   . Hypertension   . Insomnia   . Osteopenia 12/08   hip  . Pre-diabetes   . Spinal stenosis 2001  . Vitamin D  deficiency    Past Surgical History:  Procedure Laterality Date  . BILATERAL SALPINGOOPHORECTOMY  12/97  . BREAST LUMPECTOMY Right 03/2003  . CARPAL TUNNEL RELEASE    . CERVICAL FUSION    . CERVICAL LAMINECTOMY  1975   x4  . CHOLECYSTECTOMY  11/00   lap chole  . HYSTEROSCOPY  10/97   D&C, postmenopausal bleeding  . LUMBAR LAMINECTOMY  10/06   with spinal stenosis  . manipulation of hip for dislocation    . ROTATOR CUFF REPAIR Right 11/96  . TONSILLECTOMY AND ADENOIDECTOMY    . TOTAL ABDOMINAL HYSTERECTOMY  12/97   endo hyperplasia, fibroids, adenomyosis  . TOTAL HIP ARTHROPLASTY Right 8/00  . TOTAL HIP REVISION Right 07/10/2015   Procedure: RIGHT ACETABULAR VS TOTAL HIP REVISION;  Surgeon: Gaynelle Arabian, MD;  Location: [facility];  Service: Orthopedics;  Laterality: Right;  . TOTAL KNEE ARTHROPLASTY Left 06/2003  . TOTAL KNEE ARTHROPLASTY Right 10/06  . TUBAL LIGATION Bilateral 1968     A IV Location/Drains/Wounds Patient Lines/Drains/Airways Status   Active Line/Drains/Airways    Name:   Placement date:   Placement time:   Site:   Days:   Peripheral IV 09/28/18 Right Antecubital   09/28/18    2311    Antecubital   1   Peripheral IV 09/28/18 Left Antecubital   09/28/18    2311    Antecubital   1  Intake/Output Last 24 hours  Intake/Output Summary (Last 24 hours) at 09/29/2018 1529 Last data filed at 09/29/2018 0433 Gross per 24 hour  Intake 100 ml  Output 200 ml  Net -100 ml    Labs/Imaging Results for orders placed or performed during the hospital encounter of 09/28/18 (from the past 48 hour(s))  SARS Coronavirus 2 [facility] order, Performed in [facility] hospital lab) Nasopharyngeal Nasopharyngeal Swab     Status: None   Collection Time: 09/28/18 11:22 PM   Specimen: Nasopharyngeal Swab  Result Value Ref Range   SARS Coronavirus 2 NEGATIVE NEGATIVE    Comment: (NOTE) If result is NEGATIVE SARS-CoV-2 target nucleic acids are NOT DETECTED. The SARS-CoV-2  RNA is generally detectable in upper and lower  respiratory specimens during the acute phase of infection. The lowest  concentration of SARS-CoV-2 viral copies this assay can detect is 250  copies / mL. A negative result does not preclude SARS-CoV-2 infection  and should not be used as the sole basis for treatment or other  patient management decisions.  A negative result may occur with  improper specimen collection / handling, submission of specimen other  than nasopharyngeal swab, presence of viral mutation(s) within the  areas targeted by this assay, and inadequate number of viral copies  (<250 copies / mL). A negative result must be combined with clinical  observations, patient history, and epidemiological information. If result is POSITIVE SARS-CoV-2 target nucleic acids are DETECTED. The SARS-CoV-2 RNA is generally detectable in upper and lower  respiratory specimens dur ing the acute phase of infection.  Positive  results are indicative of active infection with SARS-CoV-2.  Clinical  correlation with patient history and other diagnostic information is  necessary to determine patient infection status.  Positive results do  not rule out bacterial infection or co-infection with other viruses. If result is PRESUMPTIVE POSTIVE SARS-CoV-2 nucleic acids MAY BE PRESENT.   A presumptive positive result was obtained on the submitted specimen  and confirmed on repeat testing.  While 2019 novel coronavirus  (SARS-CoV-2) nucleic acids may be present in the submitted sample  additional confirmatory testing may be necessary for epidemiological  and / or clinical management purposes  to differentiate between  SARS-CoV-2 and other Sarbecovirus currently known to infect humans.  If clinically indicated additional testing with an alternate test  methodology [phone]) is advised. The SARS-CoV-2 RNA is generally  detectable in upper and lower respiratory sp ecimens during the acute  phase of  infection. The expected result is Negative. Fact Sheet for Patients:  StrictlyIdeas.no Fact Sheet for Healthcare Providers: BankingDealers.co.za This test is not yet approved or cleared by the [place] FDA and has been authorized for detection and/or diagnosis of SARS-CoV-2 by FDA under an Emergency Use Authorization (EUA).  This EUA will remain in effect (meaning this test can be used) for the duration of the COVID-19 declaration under Section 564(b)(1) of the Act, 21 U.S.C. section 360bbb-3(b)(1), unless the authorization is terminated or revoked sooner. Performed at [facility], [place] [address], [place], [place] [address]   CBC with Differential/Platelet     Status: Abnormal   Collection Time: 09/29/18  2:02 AM  Result Value Ref Range   WBC 25.9 (H) 4.0 - 10.5 K/uL   RBC 4.24 3.87 - 5.11 MIL/uL   Hemoglobin 13.7 12.0 - 15.0 g/dL   HCT 42.5 36.0 - 46.0 %   MCV 100.2 (H) 80.0 - 100.0 fL   MCH 32.3 26.0 - 34.0 pg  MCHC 32.2 30.0 - 36.0 g/dL   RDW 13.9 11.5 - 15.5 %   Platelets 231 150 - 400 K/uL   nRBC 0.0 0.0 - 0.2 %   Neutrophils Relative % 95 %   Neutro Abs 24.7 (H) 1.7 - 7.7 K/uL   Lymphocytes Relative 2 %   Lymphs Abs 0.4 (L) 0.7 - 4.0 K/uL   Monocytes Relative 2 %   Monocytes Absolute 0.6 0.1 - 1.0 K/uL   Eosinophils Relative 0 %   Eosinophils Absolute 0.0 0.0 - 0.5 K/uL   Basophils Relative 0 %   Basophils Absolute 0.0 0.0 - 0.1 K/uL   Immature Granulocytes 1 %   Abs Immature Granulocytes 0.22 (H) 0.00 - 0.07 K/uL    Comment: Performed at [facility], [place] [address], [place], [place] [address]  Basic metabolic panel     Status: Abnormal   Collection Time: 09/29/18  2:02 AM  Result Value Ref Range   Sodium 138 135 - 145 mmol/L   Potassium 3.8 3.5 - 5.1 mmol/L   Chloride 105 98 - 111 mmol/L   CO2 23 22 - 32 mmol/L   Glucose, Bld 167 (H) 70 - 99 mg/dL   BUN 20 8 - 23 mg/dL    Creatinine, Ser 1.03 (H) 0.44 - 1.00 mg/dL   Calcium 9.1 8.9 - 10.3 mg/dL   GFR calc non Af Amer 49 (L) >60 mL/min   GFR calc Af Amer 57 (L) >60 mL/min   Anion gap 10 5 - 15    Comment: Performed at [place] [address] [place], [place] [address]  Brain natriuretic peptide     Status: Abnormal   Collection Time: 09/29/18  2:02 AM  Result Value Ref Range   B Natriuretic Peptide 223.7 (H) 0.0 - 100.0 pg/mL    Comment: Performed at [facility], [place] [address], [place], [place] [address]  Urinalysis, Routine w reflex microscopic     Status: Abnormal   Collection Time: 09/29/18  3:22 AM  Result Value Ref Range   Color, Urine YELLOW YELLOW   APPearance HAZY (A) CLEAR   Specific Gravity, Urine 1.011 1.005 - 1.030   pH 5.0 5.0 - 8.0   Glucose, UA NEGATIVE NEGATIVE mg/dL   Hgb urine dipstick SMALL (A) NEGATIVE   Bilirubin Urine NEGATIVE NEGATIVE   Ketones, ur NEGATIVE NEGATIVE mg/dL   Protein, ur 100 (A) NEGATIVE mg/dL   Nitrite NEGATIVE NEGATIVE   Leukocytes,Ua LARGE (A) NEGATIVE   RBC / HPF 11-20 0 - 5 RBC/hpf   WBC, UA >50 (H) 0 - 5 WBC/hpf   Bacteria, UA MANY (A) NONE SEEN   Squamous Epithelial / LPF 0-5 0 - 5   WBC Clumps PRESENT    Mucus PRESENT     Comment: Performed at [facility], [place] [address] [place], [place] [address]  APTT     Status: Abnormal   Collection Time: 09/29/18 11:19 AM  Result Value Ref Range   aPTT 42 (H) 24 - 36 seconds    Comment:        IF BASELINE aPTT IS ELEVATED, SUGGEST PATIENT RISK ASSESSMENT BE USED TO DETERMINE APPROPRIATE ANTICOAGULANT THERAPY. Performed at [facility], [place] [address] [place],  [address]   Protime-INR     Status: Abnormal   Collection Time: 09/29/18 11:19 AM  Result Value Ref Range   Prothrombin Time 20.3 (H) 11.4 - 15.2 seconds   INR  1.8 (H) 0.8 - 1.2    Comment: (NOTE) INR goal varies based on device and disease  states. Performed at [facility], [place] [address] [place] [address] [place], [place] [address]   Lactic acid, plasma     Status: None   Collection Time: 09/29/18 11:20 AM  Result Value Ref Range   Lactic Acid, Venous 1.2 0.5 - 1.9 mmol/L    Comment: Performed at [facility], [place] [address], [place], [place] [address]   Dg Chest Port 1 View  Result Date: 09/28/2018 CLINICAL DATA:  Altered mental status, shortness of breath EXAM: PORTABLE CHEST 1 VIEW COMPARISON:  06/12/2017 FINDINGS: Mild left basilar opacity, likely atelectasis. Right lung is clear. No pleural effusion or pneumothorax. The heart is normal in size. Cervical spine fixation hardware. IMPRESSION: No evidence of acute cardiopulmonary disease. Electronically Signed   By: Julian Hy M.D.   On: 09/28/2018 23:02    Pending Labs Unresulted Labs (From admission, onward)    Start     Ordered   09/29/18 0403  Blood Culture (routine x 2)  BLOOD CULTURE X 2,   STAT     09/29/18 0402   09/29/18 0403  Lactic acid, plasma  Now then every 2 hours,   STAT     09/29/18 0402   09/29/18 0353  Urine culture  ONCE - STAT,   STAT     09/29/18 0352   Signed and Held  Basic metabolic panel  Tomorrow morning,   R     Signed and Held   Signed and Held  CBC  Tomorrow morning,   R     Signed and Held   Signed and Held  Comprehensive metabolic panel  Once,   R     Signed and Held   Signed and Held  CBC WITH DIFFERENTIAL  Once,   R     Signed and Held          Vitals/Pain Today's Vitals   09/29/18 1100 09/29/18 1200 09/29/18 1400 09/29/18 1500  BP: 124/86 (!) 144/85 (!) 137/101 (!) 142/82  Pulse: (!) 115 (!) 116 (!) 118 (!) 124  Resp: (!) 30 (!) 31 18 16   Temp:      TempSrc:      SpO2: 95% 95% 93% 93%  PainSc:        Isolation Precautions No active isolations  Medications Medications  apixaban (ELIQUIS) tablet 5 mg (5 mg Oral Given 09/29/18 1006)  cefTRIAXone (ROCEPHIN) 1 g in sodium chloride 0.9 %  100 mL IVPB (0 g Intravenous Stopped 09/29/18 0433)    Mobility walks with person assist Moderate fall risk   Focused Assessments Neuro Assessment Handoff:  Swallow screen pass? Yes      Last date known well: 09/28/18 Last time known well: (nursing home notes "this morning") Neuro Assessment:   Neuro Checks:      Last Documented NIHSS Modified Score:   Has TPA been given? No If patient is a Neuro Trauma and patient is going to OR before floor call report to [place] nurse: [phone] or [phone]  , Pulmonary Assessment Handoff:  Lung sounds: L Breath Sounds: Clear R Breath Sounds: Clear O2 Device: Nasal Cannula O2 Flow Rate (L/min): 2 L/min      R Recommendations: See Admitting Provider Note  Report given to:   Additional Notes:

## 2018-09-29 NOTE — ED Notes (Addendum)
Unable to obtain rectal temp due to excessive stool. Peri care performed. Brief and linens changed. EDP made aware. Will continue to monitor patient.

## 2018-09-29 NOTE — Progress Notes (Signed)
Pharmacy Antibiotic Note  [name] is a 85 y.o. female admitted on 09/28/2018 with altered mental status, fever, chills, and shortness of breath. Patient initially placed on Ceftriaxone for UTI. Once patient arrived to the floor, patient had new fever with increased HR and RR. MD broadening antibiotic coverage for suspected sepsis. Pharmacy has been consulted for Vancomycin dosing.  Plan: Vancomycin 1750mg  IV x 1, then 1250mg  IV q24h. Vancomycin levels at steady state, as indicated. Continue Ceftriaxone 1g IV q24h per MD. Monitor renal function, cultures, clinical course.     Temp (24hrs), Avg:99.7 F (37.6 C), Min:98.1 F (36.7 C), Max:101.3 F (38.5 C)  Recent Labs  Lab 09/29/18 0202 09/29/18 1120 09/29/18 1650  WBC 25.9*  --  29.8*  CREATININE 1.03*  --   --   LATICACIDVEN  --  1.2  --     Estimated Creatinine Clearance: 46.2 mL/min (A) (by C-G formula based on SCr of 1.03 mg/dL (H)).    Allergies  Allergen Reactions  . Oxycodone Hcl [Oxycodone Hcl] Rash  . Morphine Sulfate Other (See Comments)    REACTION: very anxious  . Sulfamethoxazole Other (See Comments)    REACTION: unspecified    Antimicrobials this admission: 8/13 Ceftriaxone >>  8/13 Vancomycin >>   Microbiology results: 8/12 COVID: negative 8/13 BCx: sent 8/13 UCx: sent    Thank you for allowing pharmacy to be a part of this patient's care.    Lindell Spar, PharmD, BCPS Clinical Pharmacist  09/29/2018 5:48 PM

## 2018-09-29 NOTE — Progress Notes (Addendum)
Notified by RN once pt arrived to floor that pt has new fever 101.3 with HR 130s RR 35-40 with stable BP with saturation 93% on 3L  This represents a change in patient's status from earlier today when I evaluated pt.  Suspect sepsis.  I discussed case with Dr. Wyline Copas whom graciously agreed to see patient.  DTat

## 2018-09-29 NOTE — Progress Notes (Signed)
Dr Tat was paged and returned my call concerning patient VS of HR 132, B/P 160/109, RR 40, O2 sat 98 on 3L temp 101.3 AX. Awaiting MD to see patient.

## 2018-09-29 NOTE — Progress Notes (Signed)
PROGRESS NOTE  [name] SJG:[id number] DOB: 12-12-1930 DOA: 09/28/2018 PCP: Reynold Bowen, MD  Brief History:  85 year old female with a history of impaired glucose tolerance, breast cancer in remission hypertension, COPD/asthma, spinal stenosis, depression presenting from [facility] secondary to fevers, chills, and shortness of breath.  Apparently, the patient was also confused.  The patient has a history of chronic dysphagia, and she has had an extensive work-up in the past.  According to Dr. Hal Hope, the current plan is no further work-up at this time.  The patient herself denies any headache, chest pain, hemoptysis, nausea, vomiting, diarrhea, abdominal pain.  She complains of dysuria.  Emergency department, the patient was afebrile and hemodynamically stable.  She required 2 L nasal cannula to maintain oxygen saturation 95-96%.  WBC is 25.9.  UA showed greater than 50 WBC.  The patient was started on IV fluids and ceftriaxone.   Assessment/Plan: Acute metabolic encephalopathy -Secondary to hypoxia and UTI -Mental status seems to be improving, but not yet at baseline  Acute respiratory failure with hypoxia -Secondary to asthma/COPD exacerbation (mild) -Stable on 2 L nasal cannula -Wean oxygen as tolerated back to room air  UTI -Continue ceftriaxone pending urine culture  COPD exacerbation (mild) -Patient has 30-40-pack-year history -Start Pulmicort -Start duo nebs -Plan to start steroids if no improvement -SARS-CoV2 neg  Essential hypertension -Continue metoprolol heart rate  History of pulmonary embolus -Patient had pulmonary embolus 04/01/2017 -Case was discussed with the patient son who prefers to continue apixaban -Continue apixaban for now  Dysphagia -Patient has had extensive work-up in the past -Continue regular diet with thin liquids as per last speech therapist evaluation on 10/19/2017       Disposition Plan:   SNF in 2-3 days  Family  Communication:   Son updated on phone 8/13  Consultants:  none  Code Status:  FUL  DVT Prophylaxis:  apixaban   Procedures: As Listed in Progress Note Above  Antibiotics: Ceftriaxone 8/13>>>   Total time spent 35 minutes.  Greater than 50% spent face to face counseling and coordinating care.     Subjective: Patient denies fevers, chills, headache, chest pain, dyspnea, nausea, vomiting, diarrhea, abdominal pain, hematuria, hematochezia, and melena. Complains of dysuria   Objective: Vitals:   09/29/18 0330 09/29/18 0400 09/29/18 0600 09/29/18 0700  BP: 118/79 109/79 132/74 (!) 145/105  Pulse: 94 90 99 (!) 109  Resp: (!) 28 (!) 23 (!) 22 20  Temp:      TempSrc:      SpO2: 96% 96% 94% 95%    Intake/Output Summary (Last 24 hours) at 09/29/2018 0759 Last data filed at 09/29/2018 0433 Gross per 24 hour  Intake 100 ml  Output 200 ml  Net -100 ml   Weight change:  Exam:   General:  Pt is alert, follows commands appropriately, not in acute distress  HEENT: No icterus, No thrush, No neck mass, [place]/AT  Cardiovascular: RRR, S1/S2, no rubs, no gallops  Respiratory: Bibasilar rales.  Mild bibasilar wheeze.  Good air movement.  Abdomen: Soft/+BS, non tender, non distended, no guarding  Extremities: No edema, No lymphangitis, No petechiae, No rashes, no synovitis   Data Reviewed: I have personally reviewed following labs and imaging studies Basic Metabolic Panel: Recent Labs  Lab 09/29/18 0202  NA 138  K 3.8  CL 105  CO2 23  GLUCOSE 167*  BUN 20  CREATININE 1.03*  CALCIUM 9.1   Liver Function Tests:  No results for input(s): AST, ALT, ALKPHOS, BILITOT, PROT, ALBUMIN in the last 168 hours. No results for input(s): LIPASE, AMYLASE in the last 168 hours. No results for input(s): AMMONIA in the last 168 hours. Coagulation Profile: No results for input(s): INR, PROTIME in the last 168 hours. CBC: Recent Labs  Lab 09/29/18 0202  WBC 25.9*  NEUTROABS 24.7*    HGB 13.7  HCT 42.5  MCV 100.2*  PLT 231   Cardiac Enzymes: No results for input(s): CKTOTAL, CKMB, CKMBINDEX, TROPONINI in the last 168 hours. BNP: Invalid input(s): POCBNP CBG: No results for input(s): GLUCAP in the last 168 hours. HbA1C: No results for input(s): HGBA1C in the last 72 hours. Urine analysis:    Component Value Date/Time   COLORURINE YELLOW 09/29/2018 0322   APPEARANCEUR HAZY (A) 09/29/2018 0322   LABSPEC 1.011 09/29/2018 0322   PHURINE 5.0 09/29/2018 0322   GLUCOSEU NEGATIVE 09/29/2018 0322   HGBUR SMALL (A) 09/29/2018 0322   BILIRUBINUR NEGATIVE 09/29/2018 0322   BILIRUBINUR n 09/25/2010 1525   KETONESUR NEGATIVE 09/29/2018 0322   PROTEINUR 100 (A) 09/29/2018 0322   UROBILINOGEN 0.2 09/25/2010 1525   UROBILINOGEN 0.2 01/04/2007 0900   NITRITE NEGATIVE 09/29/2018 0322   LEUKOCYTESUR LARGE (A) 09/29/2018 0322   Sepsis Labs: @LABRCNTIP (procalcitonin:4,lacticidven:4) ) Recent Results (from the past 240 hour(s))  SARS Coronavirus 2 [facility] order, Performed in [facility] [place] hospital lab) Nasopharyngeal Nasopharyngeal Swab     Status: None   Collection Time: 09/28/18 11:22 PM   Specimen: Nasopharyngeal Swab  Result Value Ref Range Status   SARS Coronavirus 2 NEGATIVE NEGATIVE Final    Comment: (NOTE) If result is NEGATIVE SARS-CoV-2 target nucleic acids are NOT DETECTED. The SARS-CoV-2 RNA is generally detectable in upper and lower  respiratory specimens during the acute phase of infection. The lowest  concentration of SARS-CoV-2 viral copies this assay can detect is 250  copies / mL. A negative result does not preclude SARS-CoV-2 infection  and should not be used as the sole basis for treatment or other  patient management decisions.  A negative result may occur with  improper specimen collection / handling, submission of specimen other  than nasopharyngeal swab, presence of viral mutation(s) within the  areas targeted by this assay, and inadequate  number of viral copies  (<250 copies / mL). A negative result must be combined with clinical  observations, patient history, and epidemiological information. If result is POSITIVE SARS-CoV-2 target nucleic acids are DETECTED. The SARS-CoV-2 RNA is generally detectable in upper and lower  respiratory specimens dur ing the acute phase of infection.  Positive  results are indicative of active infection with SARS-CoV-2.  Clinical  correlation with patient history and other diagnostic information is  necessary to determine patient infection status.  Positive results do  not rule out bacterial infection or co-infection with other viruses. If result is PRESUMPTIVE POSTIVE SARS-CoV-2 nucleic acids MAY BE PRESENT.   A presumptive positive result was obtained on the submitted specimen  and confirmed on repeat testing.  While 2019 novel coronavirus  (SARS-CoV-2) nucleic acids may be present in the submitted sample  additional confirmatory testing may be necessary for epidemiological  and / or clinical management purposes  to differentiate between  SARS-CoV-2 and other Sarbecovirus currently known to infect humans.  If clinically indicated additional testing with an alternate test  methodology [phone]) is advised. The SARS-CoV-2 RNA is generally  detectable in upper and lower respiratory sp ecimens during the acute  phase of infection.  The expected result is Negative. Fact Sheet for Patients:  StrictlyIdeas.no Fact Sheet for Healthcare Providers: BankingDealers.co.za This test is not yet approved or cleared by the [place] FDA and has been authorized for detection and/or diagnosis of SARS-CoV-2 by FDA under an Emergency Use Authorization (EUA).  This EUA will remain in effect (meaning this test can be used) for the duration of the COVID-19 declaration under Section 564(b)(1) of the Act, 21 U.S.C. section 360bbb-3(b)(1), unless the  authorization is terminated or revoked sooner. Performed at [facility], [place] [address], [place], [place] [address]      Scheduled Meds:  apixaban  5 mg Oral BID   Continuous Infusions:  Procedures/Studies: Ct Head Wo Contrast  Result Date: 09/18/2018 CLINICAL DATA:  Un witnessed fall. Patient on anticoagulation. Head trauma. EXAM: CT HEAD WITHOUT CONTRAST CT CERVICAL SPINE WITHOUT CONTRAST TECHNIQUE: Multidetector CT imaging of the head and cervical spine was performed following the standard protocol without intravenous contrast. Multiplanar CT image reconstructions of the cervical spine were also generated. COMPARISON:  04/01/2017 FINDINGS: CT HEAD FINDINGS Brain: No evidence of acute infarction, hemorrhage, hydrocephalus, extra-axial collection or mass lesion/mass effect. There is ventricular sulcal enlargement reflecting moderate diffuse atrophy. Patchy periventricular white matter hypoattenuation is noted consistent with chronic microvascular ischemic change. Vascular: No hyperdense vessel or unexpected calcification. Skull: Normal. Negative for fracture or focal lesion. Sinuses/Orbits: Globes and orbits are unremarkable. Sinuses and mastoid air cells are clear. Other: None. CT CERVICAL SPINE FINDINGS Alignment: Straightened cervical lordosis.  No spondylolisthesis. Skull base and vertebrae: No acute fracture. Status post anterior cervical disc fusion from C4 through C7. Mature bone spans the disc interspaces. The orthopedic hardware is well-seated with no evidence of loosening. A wire encircles the spinous processes from C4 through C7. These findings are stable from the prior CT. No bone lesion. Soft tissues and spinal canal: No prevertebral fluid or swelling. No visible canal hematoma. Disc levels: Mild-to-moderate loss of disc height at C2-C3. Marked loss of disc height with evidence of acquired fusion at the C 3-C4 level. Moderate loss of disc height at C7-T1. Bilateral  facet degenerative changes with severe facet arthropathy on the left at C 1-C2. No convincing disc herniation. Upper chest: No acute findings.  Mild scarring at the lung apices. Other: None. IMPRESSION: HEAD CT 1. No acute intracranial abnormalities. 2. Atrophy and chronic microvascular ischemic change. 3. No skull fracture. CERVICAL CT 1. No fracture or acute finding. 2. Stable changes from previous cervical spine fusion. Stable degenerative changes. Electronically Signed   By: Lajean Manes M.D.   On: 09/18/2018 19:09   Ct Cervical Spine Wo Contrast  Result Date: 09/18/2018 CLINICAL DATA:  Un witnessed fall. Patient on anticoagulation. Head trauma. EXAM: CT HEAD WITHOUT CONTRAST CT CERVICAL SPINE WITHOUT CONTRAST TECHNIQUE: Multidetector CT imaging of the head and cervical spine was performed following the standard protocol without intravenous contrast. Multiplanar CT image reconstructions of the cervical spine were also generated. COMPARISON:  04/01/2017 FINDINGS: CT HEAD FINDINGS Brain: No evidence of acute infarction, hemorrhage, hydrocephalus, extra-axial collection or mass lesion/mass effect. There is ventricular sulcal enlargement reflecting moderate diffuse atrophy. Patchy periventricular white matter hypoattenuation is noted consistent with chronic microvascular ischemic change. Vascular: No hyperdense vessel or unexpected calcification. Skull: Normal. Negative for fracture or focal lesion. Sinuses/Orbits: Globes and orbits are unremarkable. Sinuses and mastoid air cells are clear. Other: None. CT CERVICAL SPINE FINDINGS Alignment: Straightened cervical lordosis.  No spondylolisthesis. Skull base and vertebrae: No acute fracture.  Status post anterior cervical disc fusion from C4 through C7. Mature bone spans the disc interspaces. The orthopedic hardware is well-seated with no evidence of loosening. A wire encircles the spinous processes from C4 through C7. These findings are stable from the prior CT. No  bone lesion. Soft tissues and spinal canal: No prevertebral fluid or swelling. No visible canal hematoma. Disc levels: Mild-to-moderate loss of disc height at C2-C3. Marked loss of disc height with evidence of acquired fusion at the C 3-C4 level. Moderate loss of disc height at C7-T1. Bilateral facet degenerative changes with severe facet arthropathy on the left at C 1-C2. No convincing disc herniation. Upper chest: No acute findings.  Mild scarring at the lung apices. Other: None. IMPRESSION: HEAD CT 1. No acute intracranial abnormalities. 2. Atrophy and chronic microvascular ischemic change. 3. No skull fracture. CERVICAL CT 1. No fracture or acute finding. 2. Stable changes from previous cervical spine fusion. Stable degenerative changes. Electronically Signed   By: Lajean Manes M.D.   On: 09/18/2018 19:09   Dg Chest Port 1 View  Result Date: 09/28/2018 CLINICAL DATA:  Altered mental status, shortness of breath EXAM: PORTABLE CHEST 1 VIEW COMPARISON:  06/12/2017 FINDINGS: Mild left basilar opacity, likely atelectasis. Right lung is clear. No pleural effusion or pneumothorax. The heart is normal in size. Cervical spine fixation hardware. IMPRESSION: No evidence of acute cardiopulmonary disease. Electronically Signed   By: Julian Hy M.D.   On: 09/28/2018 23:02   Dg Knee Complete 4 Views Right  Result Date: 09/18/2018 CLINICAL DATA:  Unwitnessed fall.  Knee pain EXAM: RIGHT KNEE - COMPLETE 4+ VIEW COMPARISON:  None. FINDINGS: Small joint effusion. Prior right knee replacement. No acute bony abnormality. Specifically, no fracture, subluxation, or dislocation. IMPRESSION: No acute bony abnormality. Electronically Signed   By: Rolm Baptise M.D.   On: 09/18/2018 18:59   Dg Foot Complete Left  Result Date: 09/18/2018 CLINICAL DATA:  Recent fall with first toe pain, initial encounter EXAM: LEFT FOOT - COMPLETE 3+ VIEW COMPARISON:  None. FINDINGS: Generalized osteopenia is noted. No soft tissue  abnormality is seen. Cortical irregularity is noted along the lateral aspect of the first distal phalanx consistent with undisplaced fracture. No other definitive fracture is seen. IMPRESSION: Fracture at the base of the first distal phalanx laterally. Electronically Signed   By: Inez Catalina M.D.   On: 09/18/2018 19:02    Orson Eva, DO  [facility] Pager [phone]  If 7PM-7AM, please contact night-coverage [email or web address] Password [email or web address] 09/29/2018, 7:59 AM   LOS: 0 days

## 2018-09-30 DIAGNOSIS — G9341 Metabolic encephalopathy: Secondary | ICD-10-CM

## 2018-09-30 DIAGNOSIS — J9601 Acute respiratory failure with hypoxia: Secondary | ICD-10-CM

## 2018-09-30 LAB — URINE CULTURE: Culture: >=100000 — AB

## 2018-09-30 LAB — CBC
HCT: 39.5 % (ref 36.0–46.0)
Hemoglobin: 12.3 g/dL (ref 12.0–15.0)
MCH: 31.9 pg (ref 26.0–34.0)
MCHC: 31.1 g/dL (ref 30.0–36.0)
MCV: 102.3 fL — ABNORMAL HIGH (ref 80.0–100.0)
Platelets: 207 10*3/uL (ref 150–400)
RBC: 3.86 MIL/uL — ABNORMAL LOW (ref 3.87–5.11)
RDW: 14.1 % (ref 11.5–15.5)
WBC: 17.6 10*3/uL — ABNORMAL HIGH (ref 4.0–10.5)
nRBC: 0 % (ref 0.0–0.2)

## 2018-09-30 LAB — BASIC METABOLIC PANEL
Anion gap: 9 (ref 5–15)
BUN: 22 mg/dL (ref 8–23)
CO2: 24 mmol/L (ref 22–32)
Calcium: 9 mg/dL (ref 8.9–10.3)
Chloride: 107 mmol/L (ref 98–111)
Creatinine, Ser: 0.73 mg/dL (ref 0.44–1.00)
GFR calc Af Amer: >60 mL/min (ref >60)
GFR calc non Af Amer: >60 mL/min (ref >60)
Glucose, Bld: 145 mg/dL — ABNORMAL HIGH (ref 70–99)
Potassium: 3.7 mmol/L (ref 3.5–5.1)
Sodium: 140 mmol/L (ref 135–145)

## 2018-09-30 LAB — MRSA PCR SCREENING: MRSA by PCR: POSITIVE — AB

## 2018-09-30 MED ORDER — PREDNISONE 50 MG PO TABS
50.0000 mg | ORAL_TABLET | Freq: Every day | ORAL | Status: DC
  Administered 2018-10-01 – 2018-10-02 (×2): 50 mg via ORAL
  Filled 2018-09-30 (×2): qty 1

## 2018-09-30 MED ORDER — CHLORHEXIDINE GLUCONATE CLOTH 2 % EX PADS
6.0000 | MEDICATED_PAD | Freq: Every day | CUTANEOUS | Status: DC
  Administered 2018-09-30 – 2018-10-02 (×2): 6 via TOPICAL

## 2018-09-30 MED ORDER — IPRATROPIUM-ALBUTEROL 0.5-2.5 (3) MG/3ML IN SOLN
3.0000 mL | Freq: Three times a day (TID) | RESPIRATORY_TRACT | Status: DC
  Administered 2018-09-30 – 2018-10-02 (×7): 3 mL via RESPIRATORY_TRACT
  Filled 2018-09-30 (×7): qty 3

## 2018-09-30 MED ORDER — MUPIROCIN 2 % EX OINT
1.0000 "application " | TOPICAL_OINTMENT | Freq: Two times a day (BID) | CUTANEOUS | Status: DC
  Administered 2018-09-30 – 2018-10-02 (×5): 1 via NASAL
  Filled 2018-09-30: qty 22

## 2018-09-30 NOTE — Progress Notes (Signed)
PROGRESS NOTE  [name] RFF:[id number] DOB: 10/05/1930 DOA: 09/28/2018 PCP: Reynold Bowen, MD  Brief History:  85 year old female with a history of impaired glucose tolerance, breast cancer in remission hypertension, COPD/asthma, spinal stenosis, depression presenting from [facility] secondary to fevers, chills, and shortness of breath.  Apparently, the patient was also confused.  The patient has a history of chronic dysphagia, and she has had an extensive work-up in the past.  According to Dr. Hal Hope, the current plan is no further work-up at this time.  The patient herself denies any headache, chest pain, hemoptysis, nausea, vomiting, diarrhea, abdominal pain.  She complains of dysuria.  Emergency department, the patient was afebrile and hemodynamically stable.  She required 2 L nasal cannula to maintain oxygen saturation 95-96%.  WBC is 25.9.  UA showed greater than 50 WBC.  The patient was started on IV fluids and ceftriaxone.   Assessment/Plan: Acute metabolic encephalopathy -Secondary to hypoxia and UTI -Mental status improving  Acute respiratory failure with hypoxia -Secondary to asthma/COPD exacerbation (mild) -Stable on 2 L nasal cannula -Wean oxygen as tolerated back to room air  UTI--[place]i -Continue ceftriaxone  COPD exacerbation (mild) -Patient has 30-40-pack-year history -Continue Pulmicort -Conitnue duo nebs -Wean steroids -SARS-CoV2 neg  Essential hypertension -Continue metoprolol heart rate  History of pulmonary embolus -Patient had pulmonary embolus 04/01/2017 -Case was discussed with the patient son who prefers to continue apixaban -Continue apixaban for now  Dysphagia -Patient has had extensive work-up in the past -Continue regular diet with thin liquids as per last speech therapist evaluation on 10/19/2017       Disposition Plan:   SNF 8/15 if stable Family Communication:   Son updated on phone 8/13  Consultants:   none  Code Status:  FUL  DVT Prophylaxis:  apixaban   Procedures: As Listed in Progress Note Above  Antibiotics: Ceftriaxone 8/13>>> Vanco 8/13>>>  Subjective: Pt states she is breathing much better.  Denies f/c, cp, sob, n/v/d, abd pain, dysuria.  Objective: Vitals:   09/30/18 0004 09/30/18 0527 09/30/18 0752 09/30/18 1107  BP:  (!) 144/92    Pulse:  85  (!) 109  Resp:  20    Temp:  (!) 97.5 F (36.4 C)    TempSrc:  Oral    SpO2:  97% 99% 95%  Height: 5\' 8"  (1.727 m)       Intake/Output Summary (Last 24 hours) at 09/30/2018 1143 Last data filed at 09/30/2018 1011 Gross per 24 hour  Intake 1679.14 ml  Output 800 ml  Net 879.14 ml   Weight change:  Exam:   General:  Pt is alert, follows commands appropriately, not in acute distress  HEENT: No icterus, No thrush, No neck mass, [place]/AT  Cardiovascular: RRR, S1/S2, no rubs, no gallops  Respiratory: bibasilar rales, no wheeze  Abdomen: Soft/+BS, non tender, non distended, no guarding  Extremities: No edema, No lymphangitis, No petechiae, No rashes, no synovitis   Data Reviewed: I have personally reviewed following labs and imaging studies Basic Metabolic Panel: Recent Labs  Lab 09/29/18 0202 09/29/18 1650 09/30/18 0527  NA 138 138 140  K 3.8 3.9 3.7  CL 105 102 107  CO2 23 24 24   GLUCOSE 167* 108* 145*  BUN 20 23 22   CREATININE 1.03* 0.91 0.73  CALCIUM 9.1 9.5 9.0   Liver Function Tests: Recent Labs  Lab 09/29/18 1650  AST 27  ALT 18  ALKPHOS 98  BILITOT 0.4  PROT 7.0  ALBUMIN 3.2*   No results for input(s): LIPASE, AMYLASE in the last 168 hours. No results for input(s): AMMONIA in the last 168 hours. Coagulation Profile: Recent Labs  Lab 09/29/18 1119  INR 1.8*   CBC: Recent Labs  Lab 09/29/18 0202 09/29/18 1650 09/30/18 0527  WBC 25.9* 29.8* 17.6*  NEUTROABS 24.7* 27.3*  --   HGB 13.7 13.9 12.3  HCT 42.5 44.2 39.5  MCV 100.2* 100.9* 102.3*  PLT 231 273 207   Cardiac  Enzymes: No results for input(s): CKTOTAL, CKMB, CKMBINDEX, TROPONINI in the last 168 hours. BNP: Invalid input(s): POCBNP CBG: No results for input(s): GLUCAP in the last 168 hours. HbA1C: No results for input(s): HGBA1C in the last 72 hours. Urine analysis:    Component Value Date/Time   COLORURINE YELLOW 09/29/2018 0322   APPEARANCEUR HAZY (A) 09/29/2018 0322   LABSPEC 1.011 09/29/2018 0322   PHURINE 5.0 09/29/2018 0322   GLUCOSEU NEGATIVE 09/29/2018 0322   HGBUR SMALL (A) 09/29/2018 0322   BILIRUBINUR NEGATIVE 09/29/2018 0322   BILIRUBINUR n 09/25/2010 1525   KETONESUR NEGATIVE 09/29/2018 0322   PROTEINUR 100 (A) 09/29/2018 0322   UROBILINOGEN 0.2 09/25/2010 1525   UROBILINOGEN 0.2 01/04/2007 0900   NITRITE NEGATIVE 09/29/2018 0322   LEUKOCYTESUR LARGE (A) 09/29/2018 0322   Sepsis Labs: @LABRCNTIP (procalcitonin:4,lacticidven:4) ) Recent Results (from the past 240 hour(s))  SARS Coronavirus 2 [facility] order, Performed in [facility] hospital lab) Nasopharyngeal Nasopharyngeal Swab     Status: None   Collection Time: 09/28/18 11:22 PM   Specimen: Nasopharyngeal Swab  Result Value Ref Range Status   SARS Coronavirus 2 NEGATIVE NEGATIVE Final    Comment: (NOTE) If result is NEGATIVE SARS-CoV-2 target nucleic acids are NOT DETECTED. The SARS-CoV-2 RNA is generally detectable in upper and lower  respiratory specimens during the acute phase of infection. The lowest  concentration of SARS-CoV-2 viral copies this assay can detect is 250  copies / mL. A negative result does not preclude SARS-CoV-2 infection  and should not be used as the sole basis for treatment or other  patient management decisions.  A negative result may occur with  improper specimen collection / handling, submission of specimen other  than nasopharyngeal swab, presence of viral mutation(s) within the  areas targeted by this assay, and inadequate number of viral copies  (<250 copies / mL). A negative  result must be combined with clinical  observations, patient history, and epidemiological information. If result is POSITIVE SARS-CoV-2 target nucleic acids are DETECTED. The SARS-CoV-2 RNA is generally detectable in upper and lower  respiratory specimens dur ing the acute phase of infection.  Positive  results are indicative of active infection with SARS-CoV-2.  Clinical  correlation with patient history and other diagnostic information is  necessary to determine patient infection status.  Positive results do  not rule out bacterial infection or co-infection with other viruses. If result is PRESUMPTIVE POSTIVE SARS-CoV-2 nucleic acids MAY BE PRESENT.   A presumptive positive result was obtained on the submitted specimen  and confirmed on repeat testing.  While 2019 novel coronavirus  (SARS-CoV-2) nucleic acids may be present in the submitted sample  additional confirmatory testing may be necessary for epidemiological  and / or clinical management purposes  to differentiate between  SARS-CoV-2 and other Sarbecovirus currently known to infect humans.  If clinically indicated additional testing with an alternate test  methodology [id number]) is advised. The SARS-CoV-2 RNA is generally  detectable in upper and lower respiratory  sp ecimens during the acute  phase of infection. The expected result is Negative. Fact Sheet for Patients:  StrictlyIdeas.no Fact Sheet for Healthcare Providers: BankingDealers.co.za This test is not yet approved or cleared by the [place] FDA and has been authorized for detection and/or diagnosis of SARS-CoV-2 by FDA under an Emergency Use Authorization (EUA).  This EUA will remain in effect (meaning this test can be used) for the duration of the COVID-19 declaration under Section 564(b)(1) of the Act, 21 U.S.C. section 360bbb-3(b)(1), unless the authorization is terminated or revoked sooner. Performed at [facility], [place] [address], [place], [place] [address]   Urine culture     Status: Abnormal   Collection Time: 09/29/18  3:22 AM   Specimen: Urine, Random  Result Value Ref Range Status   Specimen Description   Final    URINE, RANDOM Performed at [place] [address] [place], [place] [address]    Special Requests   Final    NONE Performed at [facility], [place] [address] [place], [place] [address]    Culture >=100,000 COLONIES/mL ESCHERICHIA COLI (A)  Final   Report Status 09/30/2018 FINAL  Final   Organism ID, Bacteria ESCHERICHIA COLI (A)  Final      Susceptibility   Escherichia coli - MIC*    AMPICILLIN >=32 RESISTANT Resistant     CEFAZOLIN >=64 RESISTANT Resistant     CEFTRIAXONE <=1 SENSITIVE Sensitive     CIPROFLOXACIN <=0.25 SENSITIVE Sensitive     GENTAMICIN <=1 SENSITIVE Sensitive     IMIPENEM <=0.25 SENSITIVE Sensitive     NITROFURANTOIN 64 INTERMEDIATE Intermediate     TRIMETH/SULFA >=320 RESISTANT Resistant     AMPICILLIN/SULBACTAM >=32 RESISTANT Resistant     PIP/TAZO 64 INTERMEDIATE Intermediate     Extended ESBL NEGATIVE Sensitive     * >=100,000 COLONIES/mL ESCHERICHIA COLI  Blood Culture (routine x 2)     Status: None (Preliminary result)   Collection Time: 09/29/18  5:50 AM   Specimen: BLOOD LEFT FOREARM  Result Value Ref Range Status   Specimen Description   Final    BLOOD LEFT FOREARM Performed at [place] [address], [place], [place] [address]    Special Requests   Final    BOTTLES DRAWN AEROBIC AND ANAEROBIC Blood Culture adequate volume Performed at [facility] [address], [place], [place] [address]    Culture   Final    NO GROWTH 1 DAY Performed at [facility], [place] [address] [place], [place] [address]    Report Status PENDING  Incomplete  Blood Culture (routine x 2)     Status: None (Preliminary result)   Collection Time:  09/29/18  4:30 PM   Specimen: BLOOD  Result Value Ref Range Status   Specimen Description   Final    BLOOD LEFT ARM Performed at [facility] [address] [place], [place] [address]    Special Requests   Final    BOTTLES DRAWN AEROBIC ONLY Blood Culture results may not be optimal due to an inadequate volume of blood received in culture bottles Performed at [facility] [address], [place], [place] [address]    Culture   Final    NO GROWTH < 24 HOURS Performed at [facility] [address] [place],  [address]    Report Status PENDING  Incomplete  MRSA PCR Screening  Status: Abnormal   Collection Time: 09/29/18  9:02 PM   Specimen: Nasal Mucosa; Nasopharyngeal  Result Value Ref Range Status   MRSA by PCR POSITIVE (A) NEGATIVE Final    Comment:        The GeneXpert MRSA Assay (FDA approved for NASAL specimens only), is one component of a comprehensive MRSA colonization surveillance program. It is not intended to diagnose MRSA infection nor to guide or monitor treatment for MRSA infections. RESULT CALLED TO, READ BACK BY AND VERIFIED WITH: CASEY ELLIS @ 0932 ON 09/30/2018 C VARNER Performed at [facility], [place] [address] [place], [place] [address]      Scheduled Meds:  apixaban  5 mg Oral BID   aspirin EC  325 mg Oral Daily   budesonide (PULMICORT) nebulizer solution  0.25 mg Nebulization BID   buPROPion  300 mg Oral BH-q7a   calcium-vitamin D  1 tablet Oral Daily   Chlorhexidine Gluconate Cloth  6 each Topical Q0600   escitalopram  10 mg Oral Daily   fluticasone  1 spray Each Nare BID   guaiFENesin  600 mg Oral BID   ipratropium-albuterol  3 mL Nebulization TID   Melatonin  3 mg Oral QHS   methylPREDNISolone (SOLU-MEDROL) injection  60 mg Intravenous Q8H   metoprolol tartrate  12.5 mg Oral BID   mirabegron ER  50 mg Oral Daily   mirtazapine  30 mg Oral QHS    montelukast  10 mg Oral QHS   mupirocin ointment  1 application Nasal BID   polyvinyl alcohol  1 drop Both Eyes BID   Continuous Infusions:  cefTRIAXone (ROCEPHIN)  IV 1 g (09/30/18 0428)   lactated ringers 75 mL/hr at 09/30/18 0520   vancomycin      Procedures/Studies: Ct Head Wo Contrast  Result Date: 09/18/2018 CLINICAL DATA:  Un witnessed fall. Patient on anticoagulation. Head trauma. EXAM: CT HEAD WITHOUT CONTRAST CT CERVICAL SPINE WITHOUT CONTRAST TECHNIQUE: Multidetector CT imaging of the head and cervical spine was performed following the standard protocol without intravenous contrast. Multiplanar CT image reconstructions of the cervical spine were also generated. COMPARISON:  04/01/2017 FINDINGS: CT HEAD FINDINGS Brain: No evidence of acute infarction, hemorrhage, hydrocephalus, extra-axial collection or mass lesion/mass effect. There is ventricular sulcal enlargement reflecting moderate diffuse atrophy. Patchy periventricular white matter hypoattenuation is noted consistent with chronic microvascular ischemic change. Vascular: No hyperdense vessel or unexpected calcification. Skull: Normal. Negative for fracture or focal lesion. Sinuses/Orbits: Globes and orbits are unremarkable. Sinuses and mastoid air cells are clear. Other: None. CT CERVICAL SPINE FINDINGS Alignment: Straightened cervical lordosis.  No spondylolisthesis. Skull base and vertebrae: No acute fracture. Status post anterior cervical disc fusion from C4 through C7. Mature bone spans the disc interspaces. The orthopedic hardware is well-seated with no evidence of loosening. A wire encircles the spinous processes from C4 through C7. These findings are stable from the prior CT. No bone lesion. Soft tissues and spinal canal: No prevertebral fluid or swelling. No visible canal hematoma. Disc levels: Mild-to-moderate loss of disc height at C2-C3. Marked loss of disc height with evidence of acquired fusion at the C 3-C4 level.  Moderate loss of disc height at C7-T1. Bilateral facet degenerative changes with severe facet arthropathy on the left at C 1-C2. No convincing disc herniation. Upper chest: No acute findings.  Mild scarring at the lung apices. Other: None. IMPRESSION: HEAD CT 1. No acute intracranial abnormalities. 2. Atrophy and chronic microvascular ischemic change. 3. No skull fracture.  CERVICAL CT 1. No fracture or acute finding. 2. Stable changes from previous cervical spine fusion. Stable degenerative changes. Electronically Signed   By: Lajean Manes M.D.   On: 09/18/2018 19:09   Ct Cervical Spine Wo Contrast  Result Date: 09/18/2018 CLINICAL DATA:  Un witnessed fall. Patient on anticoagulation. Head trauma. EXAM: CT HEAD WITHOUT CONTRAST CT CERVICAL SPINE WITHOUT CONTRAST TECHNIQUE: Multidetector CT imaging of the head and cervical spine was performed following the standard protocol without intravenous contrast. Multiplanar CT image reconstructions of the cervical spine were also generated. COMPARISON:  04/01/2017 FINDINGS: CT HEAD FINDINGS Brain: No evidence of acute infarction, hemorrhage, hydrocephalus, extra-axial collection or mass lesion/mass effect. There is ventricular sulcal enlargement reflecting moderate diffuse atrophy. Patchy periventricular white matter hypoattenuation is noted consistent with chronic microvascular ischemic change. Vascular: No hyperdense vessel or unexpected calcification. Skull: Normal. Negative for fracture or focal lesion. Sinuses/Orbits: Globes and orbits are unremarkable. Sinuses and mastoid air cells are clear. Other: None. CT CERVICAL SPINE FINDINGS Alignment: Straightened cervical lordosis.  No spondylolisthesis. Skull base and vertebrae: No acute fracture. Status post anterior cervical disc fusion from C4 through C7. Mature bone spans the disc interspaces. The orthopedic hardware is well-seated with no evidence of loosening. A wire encircles the spinous processes from C4 through  C7. These findings are stable from the prior CT. No bone lesion. Soft tissues and spinal canal: No prevertebral fluid or swelling. No visible canal hematoma. Disc levels: Mild-to-moderate loss of disc height at C2-C3. Marked loss of disc height with evidence of acquired fusion at the C 3-C4 level. Moderate loss of disc height at C7-T1. Bilateral facet degenerative changes with severe facet arthropathy on the left at C 1-C2. No convincing disc herniation. Upper chest: No acute findings.  Mild scarring at the lung apices. Other: None. IMPRESSION: HEAD CT 1. No acute intracranial abnormalities. 2. Atrophy and chronic microvascular ischemic change. 3. No skull fracture. CERVICAL CT 1. No fracture or acute finding. 2. Stable changes from previous cervical spine fusion. Stable degenerative changes. Electronically Signed   By: Lajean Manes M.D.   On: 09/18/2018 19:09   Dg Chest Port 1 View  Result Date: 09/29/2018 CLINICAL DATA:  Fever and tachycardia. EXAM: PORTABLE CHEST 1 VIEW COMPARISON:  Chest radiograph yesterday. FINDINGS: Patient is rotated. Mild left lung base atelectasis, similar to prior. No new focal airspace disease. Unchanged heart size and mediastinal contours. No pleural effusion, pulmonary edema, or pneumothorax. Unchanged osseous structures. IMPRESSION: Unchanged left basilar atelectasis. No new abnormality. Electronically Signed   By: Keith Rake M.D.   On: 09/29/2018 17:49   Dg Chest Port 1 View  Result Date: 09/28/2018 CLINICAL DATA:  Altered mental status, shortness of breath EXAM: PORTABLE CHEST 1 VIEW COMPARISON:  06/12/2017 FINDINGS: Mild left basilar opacity, likely atelectasis. Right lung is clear. No pleural effusion or pneumothorax. The heart is normal in size. Cervical spine fixation hardware. IMPRESSION: No evidence of acute cardiopulmonary disease. Electronically Signed   By: Julian Hy M.D.   On: 09/28/2018 23:02   Dg Knee Complete 4 Views Right  Result Date:  09/18/2018 CLINICAL DATA:  Unwitnessed fall.  Knee pain EXAM: RIGHT KNEE - COMPLETE 4+ VIEW COMPARISON:  None. FINDINGS: Small joint effusion. Prior right knee replacement. No acute bony abnormality. Specifically, no fracture, subluxation, or dislocation. IMPRESSION: No acute bony abnormality. Electronically Signed   By: Rolm Baptise M.D.   On: 09/18/2018 18:59   Dg Foot Complete Left  Result Date: 09/18/2018 CLINICAL DATA:  Recent fall with first toe pain, initial encounter EXAM: LEFT FOOT - COMPLETE 3+ VIEW COMPARISON:  None. FINDINGS: Generalized osteopenia is noted. No soft tissue abnormality is seen. Cortical irregularity is noted along the lateral aspect of the first distal phalanx consistent with undisplaced fracture. No other definitive fracture is seen. IMPRESSION: Fracture at the base of the first distal phalanx laterally. Electronically Signed   By: Inez Catalina M.D.   On: 09/18/2018 19:02    Orson Eva, DO  [facility] Pager [phone]  If 7PM-7AM, please contact night-coverage [email or web address] Password [facility] 09/30/2018, 11:43 AM   LOS: 1 day

## 2018-09-30 NOTE — Progress Notes (Signed)
Pt urine has been amber in external catheter suction canister all night. When rounding, RN noticed pt urine bright red in canister. RN and NT cleaned pt and there was a blood ring on the sheets under pt. Pt is currently on eliquis. On call NP paged and made aware. Awaiting new orders. Will pass this on to day shift RN.

## 2018-09-30 NOTE — Progress Notes (Signed)
MRSA PCR (+). Standing orders initiated.

## 2018-09-30 NOTE — Evaluation (Signed)
Physical Therapy Evaluation Patient Details Name: [name] MRN: [id number] DOB: 11-23-1930 Today's Date: 09/30/2018   History of Present Illness  [name] is a 85 y.o. female with history of COPD/asthma, hypertension history of breast cancer in remission depression prediabetic was brought to the ER after patient was found to be having fever chills and shortness of breath as per the patient's son.  Clinical Impression  Pt presents with significant dependencies in mobility affecting her independence secondary to the above diagnosis. Pt is a resident of a SNF facility and reported she has been non-ambulatory. Pt stated she had not been receiving therapy and has primarily bed bound. Pt is motivated to work on increasing activity tolerance and focusing on improving basic mobility. Pt would benefit from continued acute skilled PT to maximize mobility and decrease bruden of care.    Follow Up Recommendations SNF    Equipment Recommendations  None recommended by PT    Recommendations for Other Services       Precautions / Restrictions Precautions Precautions: Fall Precaution Comments: multiple pressure sores on LEs, hammer toes, finger deformitis due to arthritis Restrictions Weight Bearing Restrictions: No      Mobility  Bed Mobility Overal bed mobility: Needs Assistance Bed Mobility: Supine to Sit;Rolling Rolling: Mod assist   Supine to sit: Max assist;HOB elevated     General bed mobility comments: cues for technique, use of bed rails, leans to left side, assistance to slide legs over and max assist for trunk support.  Transfers                    Ambulation/Gait                Stairs            Wheelchair Mobility    Modified Rankin (Stroke Patients Only)       Balance Overall balance assessment: Needs assistance Sitting-balance support: Single extremity supported;Feet supported Sitting balance-Leahy Scale: Poor   Postural control:  Left lateral lean;Posterior lean                                   Pertinent Vitals/Pain Pain Assessment: No/denies pain    Home Living Family/patient expects to be discharged to:: Skilled nursing facility                      Prior Function Level of Independence: Needs assistance   Gait / Transfers Assistance Needed: non ambulatory     Comments: per pt not receiving therapy at SNF, has been staying in bed and when she gets up she requires a lot of assistance to transfer.     Hand Dominance        Extremity/Trunk Assessment   Upper Extremity Assessment Upper Extremity Assessment: Defer to OT evaluation    Lower Extremity Assessment Lower Extremity Assessment: Generalized weakness       Communication   Communication: No difficulties  Cognition Arousal/Alertness: Awake/alert Behavior During Therapy: Flat affect Overall Cognitive Status: Within Functional Limits for tasks assessed                                        General Comments General comments (skin integrity, edema, etc.): sat EOB for 5 mins, sitting rest break times 1 leaning on left elbow, able  to perform static sitting min assist-mod assist, incr SOB with activity o2 sats 95%    Exercises     Assessment/Plan    PT Assessment Patient needs continued PT services  PT Problem List Decreased strength;Decreased mobility;Decreased safety awareness;Decreased range of motion;Decreased activity tolerance;Decreased balance       PT Treatment Interventions DME instruction;Therapeutic activities;Therapeutic exercise;Patient/family education;Balance training;Functional mobility training;Neuromuscular re-education    PT Goals (Current goals can be found in the Care Plan section)  Acute Rehab PT Goals Patient Stated Goal: To get stronger and move more PT Goal Formulation: With patient Time For Goal Achievement: 10/14/18 Potential to Achieve Goals: Fair    Frequency Min  2X/week   Barriers to discharge        Co-evaluation               AM-PAC PT "6 Clicks" Mobility  Outcome Measure Help needed turning from your back to your side while in a flat bed without using bedrails?: Total Help needed moving from lying on your back to sitting on the side of a flat bed without using bedrails?: Total Help needed moving to and from a bed to a chair (including a wheelchair)?: Total Help needed standing up from a chair using your arms (e.g., wheelchair or bedside chair)?: Total Help needed to walk in hospital room?: Total Help needed climbing 3-5 steps with a railing? : Total 6 Click Score: 6    End of Session   Activity Tolerance: Patient limited by fatigue Patient left: in bed;with call bell/phone within reach;with bed alarm set Nurse Communication: Need for lift equipment PT Visit Diagnosis: Other abnormalities of gait and mobility (R26.89);Muscle weakness (generalized) (M62.81)    Time: 7530-0511 PT Time Calculation (min) (ACUTE ONLY): 32 min   Charges:   PT Evaluation $PT Eval Moderate Complexity: 1 Mod PT Treatments $Therapeutic Activity: 8-22 mins        Theodoro Grist, PT  Lelon Mast 09/30/2018, 11:21 AM

## 2018-10-01 ENCOUNTER — Inpatient Hospital Stay (HOSPITAL_COMMUNITY): Payer: Medicare Other

## 2018-10-01 LAB — CBC
HCT: 34.8 % — ABNORMAL LOW (ref 36.0–46.0)
Hemoglobin: 11.4 g/dL — ABNORMAL LOW (ref 12.0–15.0)
MCH: 32.4 pg (ref 26.0–34.0)
MCHC: 32.8 g/dL (ref 30.0–36.0)
MCV: 98.9 fL (ref 80.0–100.0)
Platelets: 208 10*3/uL (ref 150–400)
RBC: 3.52 MIL/uL — ABNORMAL LOW (ref 3.87–5.11)
RDW: 14 % (ref 11.5–15.5)
WBC: 9.6 10*3/uL (ref 4.0–10.5)
nRBC: 0 % (ref 0.0–0.2)

## 2018-10-01 LAB — BASIC METABOLIC PANEL
Anion gap: 6 (ref 5–15)
BUN: 18 mg/dL (ref 8–23)
CO2: 25 mmol/L (ref 22–32)
Calcium: 9 mg/dL (ref 8.9–10.3)
Chloride: 111 mmol/L (ref 98–111)
Creatinine, Ser: 0.58 mg/dL (ref 0.44–1.00)
GFR calc Af Amer: >60 mL/min (ref >60)
GFR calc non Af Amer: >60 mL/min (ref >60)
Glucose, Bld: 145 mg/dL — ABNORMAL HIGH (ref 70–99)
Potassium: 4.4 mmol/L (ref 3.5–5.1)
Sodium: 142 mmol/L (ref 135–145)

## 2018-10-01 NOTE — Progress Notes (Signed)
RN had witnessed the patient wearing what the patient stated was a ring that her mother had owned.  Patient had been wearing the ring on a finger, but later, patient was holding the ring in her hand, talking about how it needed to be cleaned.  Later on, the patient stated she couldn't find her ring.  NT and RN gave patient a bed bath and checked in all linens for the ring, but did not see it.  RN reported this to PM shift to continue looking for the ring.

## 2018-10-01 NOTE — TOC Initial Note (Signed)
Transition of Care [facility]) - Initial/Assessment Note    Patient Details  Name: [name] MRN: [id number] Date of Birth: [birth year]  [facility]) CM/SW Contact:    Wende Neighbors, LCSW Phone Number: 10/01/2018, 1:00 PM  Clinical Narrative:    CSW spoke with [facility] admission coordinator for [facility] via phone. Abigail Butts stated that patient is a long term resident at [facility] and facility will be able to take patient back once medically cleared by MD. Per Abigail Butts patient does not need an FL2 or auth since patient is a LTC patient. Abigail Butts stated that patient will a negative covid test. CSW asked MD to order new test for patient prior to discharge                   Expected Discharge Plan: [place] Barriers to Discharge: Continued Medical Work up   Patient Goals and CMS Choice        Expected Discharge Plan and Services Expected Discharge Plan: [place] In-house Referral: Clinical Social Work     Living arrangements for the past 2 months: [place]                                      Prior Living Arrangements/Services Living arrangements for the past 2 months: [place] Lives with:: Facility Resident Patient language and need for interpreter reviewed:: Yes        Need for Family Participation in Patient Care: Yes (Comment) Care giver support system in place?: Yes (comment)   Criminal Activity/Legal Involvement Pertinent to Current Situation/Hospitalization: No - Comment as needed  Activities of Daily Living Home Assistive Devices/Equipment: Environmental consultant (specify type), Wheelchair ADL Screening (condition at time of admission) Patient's cognitive ability adequate to safely complete daily activities?: No Is the patient deaf or have difficulty hearing?: No Does the patient have difficulty seeing, even when wearing glasses/contacts?: No Does the patient have difficulty concentrating,  remembering, or making decisions?: No Patient able to express need for assistance with ADLs?: Yes Does the patient have difficulty dressing or bathing?: Yes Independently performs ADLs?: No Communication: Independent Dressing (OT): Needs assistance Is this a change from baseline?: Pre-admission baseline Grooming: Needs assistance Is this a change from baseline?: Pre-admission baseline Feeding: Needs assistance Is this a change from baseline?: Pre-admission baseline Bathing: Needs assistance Is this a change from baseline?: Pre-admission baseline Toileting: Needs assistance Is this a change from baseline?: Pre-admission baseline In/Out Bed: Needs assistance Is this a change from baseline?: Pre-admission baseline Walks in Home: Needs assistance Is this a change from baseline?: Pre-admission baseline Does the patient have difficulty walking or climbing stairs?: Yes Weakness of Legs: Both Weakness of Arms/Hands: Both  Permission Sought/Granted   Permission granted to share information with : Yes, Verbal Permission Granted  Share Information with NAME: Burna Forts     Permission granted to share info w Relationship: son  Permission granted to share info w Contact Information: [phone]  Emotional Assessment Appearance:: Appears stated age Attitude/Demeanor/Rapport: Unable to Assess(pt was sleeping) Affect (typically observed): Unable to Assess Orientation: : Oriented to Self, Oriented to Place, Oriented to  Time, Oriented to Situation Alcohol / Substance Use: Not Applicable Psych Involvement: No (comment)  Admission diagnosis:  Confusion [R41.0] Acute cystitis without hematuria [Y85.02] Acute metabolic encephalopathy [D74.12] Patient Active Problem List   Diagnosis Date Noted  . Acute encephalopathy  09/29/2018  . Acute metabolic encephalopathy 27/25/3664  . Acute cystitis without hematuria   . Acute respiratory failure with hypoxia ([place]) 04/01/2017  . Laceration of  forehead   . Acute on chronic respiratory failure with hypoxia ([place]) 03/15/2017  . Fall 03/15/2017  . Depression with anxiety 03/15/2017  . COPD exacerbation ([place]) 03/15/2017  . Acute lower UTI   . Sepsis ([place]) 11/08/2016  . Morbid obesity due to excess calories ([place]) 09/02/2016  . Esophageal dysfunction 09/01/2016  . Wheelchair bound 08/09/2016  . Wound healing, delayed 08/09/2016  . Chronic ulcer of right leg ([place]) 07/07/2016  . Failed total hip arthroplasty ([place]) 07/10/2015  . Breast cancer, right breast ([place]) 07/18/2014  . CONCUSSION WITH LOC OF UNSPECIFIED DURATION 07/19/2009  . OTH SYMPTOMS INVOLVING RESPIRATORY SYSTEM&CHEST 11/12/2008  . UNSPECIFIED DISORDER OF ANKLE AND FOOT JOINT 05/11/2008  . CERUMEN IMPACTION, BILATERAL 05/04/2008  . Dysmetabolic syndrome X 40/34/7425  . UNS ADVRS EFF UNS RX MEDICINAL&BIOLOGICAL SBSTNC 08/12/2007  . Other and unspecified hyperlipidemia 04/01/2007  . INSOMNIA, PERSISTENT 09/09/2006  . Essential hypertension 09/09/2006  . DIVERTICULOSIS, COLON 09/09/2006  . Osteoarthritis 09/09/2006  . Spinal stenosis 09/09/2006  . COLONIC POLYPS, HX OF 09/09/2006   PCP:  Reynold Bowen, MD Pharmacy:   [facility], [place] - [address] [place] [address] Phone: [phone] Fax: [phone]     Social Determinants of Health (SDOH) Interventions    Readmission Risk Interventions No flowsheet data found.

## 2018-10-01 NOTE — Progress Notes (Signed)
PROGRESS NOTE    [name]  XTK:[id number] DOB: 11/02/1930 DOA: 09/28/2018 PCP: Reynold Bowen, MD  Brief Narrative:   85 year old lady with prior history of breast cancer in remission, hypertension, COPD, asthma, depression, spinal stenosis presented from [facility] with altered mental status and fevers and chills.  She was admitted for evaluation of urinary tract infection and acute COPD exacerbation  Assessment & Plan:   Principal Problem:   Acute encephalopathy Active Problems:   Essential hypertension   Acute lower UTI   Acute on chronic respiratory failure with hypoxia (HCC)   Acute respiratory failure with hypoxia (HCC)   Acute metabolic encephalopathy   Acute on chronic respiratory failure with hypoxia secondary to acute COPD exacerbations Improving and she is weaned off oxygen and plan to wean her steroids in the next 5 to 7 days. Continue with duo nebs and Pulmicort.   E. coli urinary tract infection sensitive to Rocephin Day 3 of IV antibiotics plan to transition to oral Cipro on discharge.  Shin remains afebrile and WBC count within normal limits.    History of pulmonary embolism Continue with Eliquis on discharge.   Patient reports some abdominal discomfort and distention. Ultrasound abdomen ordered to evaluate for ascites.   Essential hypertension Blood pressure parameters are well controlled.   DVT prophylaxis: Lovenox  code Status: DNR Family Communication: None at bedside  disposition Plan: Pending clinical improvement and further evaluation of the abdominal pain Consultants:   None  Procedures: None Antimicrobials: Rocephin since admission  Subjective: Patient reports some abdominal discomfort  Objective: Vitals:   10/01/18 0626 10/01/18 0725 10/01/18 1245 10/01/18 1433  BP: (!) 141/106  (!) 135/99   Pulse:   88   Resp:   16   Temp:   98.4 F (36.9 C)   TempSrc:   Oral   SpO2:  92% 94% 92%  Weight:      Height:         Intake/Output Summary (Last 24 hours) at 10/01/2018 1715 Last data filed at 10/01/2018 0600 Gross per 24 hour  Intake 827.52 ml  Output 1100 ml  Net -272.48 ml   Filed Weights   10/01/18 0608  Weight: 98.8 kg    Examination:  General exam: Appears calm and comfortable  Respiratory system: Clear to auscultation. Respiratory effort normal.  No wheezing or rhonchi Cardiovascular system: S1 & S2 heard, RRR.  Gastrointestinal system: Abdomen is soft, mildly distended, mildly tender in the lower quadrant bowel sounds are good Central nervous system: Alert and oriented. No focal neurological deficits. Extremities: Symmetric 5 x 5 power. Skin: No rashes, lesions or ulcers Psychiatry: Mood appropriate    Data Reviewed: I have personally reviewed following labs and imaging studies  CBC: Recent Labs  Lab 09/29/18 0202 09/29/18 1650 09/30/18 0527 10/01/18 0516  WBC 25.9* 29.8* 17.6* 9.6  NEUTROABS 24.7* 27.3*  --   --   HGB 13.7 13.9 12.3 11.4*  HCT 42.5 44.2 39.5 34.8*  MCV 100.2* 100.9* 102.3* 98.9  PLT 231 273 207 992   Basic Metabolic Panel: Recent Labs  Lab 09/29/18 0202 09/29/18 1650 09/30/18 0527 10/01/18 0516  NA 138 138 140 142  K 3.8 3.9 3.7 4.4  CL 105 102 107 111  CO2 23 24 24 25   GLUCOSE 167* 108* 145* 145*  BUN 20 23 22 18   CREATININE 1.03* 0.91 0.73 0.58  CALCIUM 9.1 9.5 9.0 9.0   GFR: Estimated Creatinine Clearance: 62.1 mL/min (by C-G formula based on SCr  of 0.58 mg/dL). Liver Function Tests: Recent Labs  Lab 09/29/18 1650  AST 27  ALT 18  ALKPHOS 98  BILITOT 0.4  PROT 7.0  ALBUMIN 3.2*   No results for input(s): LIPASE, AMYLASE in the last 168 hours. No results for input(s): AMMONIA in the last 168 hours. Coagulation Profile: Recent Labs  Lab 09/29/18 1119  INR 1.8*   Cardiac Enzymes: No results for input(s): CKTOTAL, CKMB, CKMBINDEX, TROPONINI in the last 168 hours. BNP (last 3 results) No results for input(s): PROBNP in the last  8760 hours. HbA1C: No results for input(s): HGBA1C in the last 72 hours. CBG: No results for input(s): GLUCAP in the last 168 hours. Lipid Profile: No results for input(s): CHOL, HDL, LDLCALC, TRIG, CHOLHDL, LDLDIRECT in the last 72 hours. Thyroid Function Tests: No results for input(s): TSH, T4TOTAL, FREET4, T3FREE, THYROIDAB in the last 72 hours. Anemia Panel: No results for input(s): VITAMINB12, FOLATE, FERRITIN, TIBC, IRON, RETICCTPCT in the last 72 hours. Sepsis Labs: Recent Labs  Lab 09/29/18 1120  LATICACIDVEN 1.2    Recent Results (from the past 240 hour(s))  SARS Coronavirus 2 [facility] order, Performed in [facility] hospital lab) Nasopharyngeal Nasopharyngeal Swab     Status: None   Collection Time: 09/28/18 11:22 PM   Specimen: Nasopharyngeal Swab  Result Value Ref Range Status   SARS Coronavirus 2 NEGATIVE NEGATIVE Final    Comment: (NOTE) If result is NEGATIVE SARS-CoV-2 target nucleic acids are NOT DETECTED. The SARS-CoV-2 RNA is generally detectable in upper and lower  respiratory specimens during the acute phase of infection. The lowest  concentration of SARS-CoV-2 viral copies this assay can detect is 250  copies / mL. A negative result does not preclude SARS-CoV-2 infection  and should not be used as the sole basis for treatment or other  patient management decisions.  A negative result may occur with  improper specimen collection / handling, submission of specimen other  than nasopharyngeal swab, presence of viral mutation(s) within the  areas targeted by this assay, and inadequate number of viral copies  (<250 copies / mL). A negative result must be combined with clinical  observations, patient history, and epidemiological information. If result is POSITIVE SARS-CoV-2 target nucleic acids are DETECTED. The SARS-CoV-2 RNA is generally detectable in upper and lower  respiratory specimens dur ing the acute phase of infection.  Positive  results are  indicative of active infection with SARS-CoV-2.  Clinical  correlation with patient history and other diagnostic information is  necessary to determine patient infection status.  Positive results do  not rule out bacterial infection or co-infection with other viruses. If result is PRESUMPTIVE POSTIVE SARS-CoV-2 nucleic acids MAY BE PRESENT.   A presumptive positive result was obtained on the submitted specimen  and confirmed on repeat testing.  While 2019 novel coronavirus  (SARS-CoV-2) nucleic acids may be present in the submitted sample  additional confirmatory testing may be necessary for epidemiological  and / or clinical management purposes  to differentiate between  SARS-CoV-2 and other Sarbecovirus currently known to infect humans.  If clinically indicated additional testing with an alternate test  methodology [phone]) is advised. The SARS-CoV-2 RNA is generally  detectable in upper and lower respiratory sp ecimens during the acute  phase of infection. The expected result is Negative. Fact Sheet for Patients:  StrictlyIdeas.no Fact Sheet for Healthcare Providers: BankingDealers.co.za This test is not yet approved or cleared by the [place] FDA and has been authorized for detection and/or diagnosis  of SARS-CoV-2 by FDA under an Emergency Use Authorization (EUA).  This EUA will remain in effect (meaning this test can be used) for the duration of the COVID-19 declaration under Section 564(b)(1) of the Act, 21 U.S.C. section 360bbb-3(b)(1), unless the authorization is terminated or revoked sooner. Performed at [facility], [place] [address], [place], [place] [address]   Urine culture     Status: Abnormal   Collection Time: 09/29/18  3:22 AM   Specimen: Urine, Random  Result Value Ref Range Status   Specimen Description   Final    URINE, RANDOM Performed at [facility] [address], [place], [place] [address]    Special Requests   Final    NONE Performed at [facility], [place] [address], [place], [place] [address]    Culture >=100,000 COLONIES/mL ESCHERICHIA COLI (A)  Final   Report Status 09/30/2018 FINAL  Final   Organism ID, Bacteria ESCHERICHIA COLI (A)  Final      Susceptibility   Escherichia coli - MIC*    AMPICILLIN >=32 RESISTANT Resistant     CEFAZOLIN >=64 RESISTANT Resistant     CEFTRIAXONE <=1 SENSITIVE Sensitive     CIPROFLOXACIN <=0.25 SENSITIVE Sensitive     GENTAMICIN <=1 SENSITIVE Sensitive     IMIPENEM <=0.25 SENSITIVE Sensitive     NITROFURANTOIN 64 INTERMEDIATE Intermediate     TRIMETH/SULFA >=320 RESISTANT Resistant     AMPICILLIN/SULBACTAM >=32 RESISTANT Resistant     PIP/TAZO 64 INTERMEDIATE Intermediate     Extended ESBL NEGATIVE Sensitive     * >=100,000 COLONIES/mL ESCHERICHIA COLI  Blood Culture (routine x 2)     Status: None (Preliminary result)   Collection Time: 09/29/18  5:50 AM   Specimen: BLOOD LEFT FOREARM  Result Value Ref Range Status   Specimen Description   Final    BLOOD LEFT FOREARM Performed at [facility] [address] [place], [place] [address]    Special Requests   Final    BOTTLES DRAWN AEROBIC AND ANAEROBIC Blood Culture adequate volume Performed at [facility] [address] [place], [place] [address]    Culture   Final    NO GROWTH 2 DAYS Performed at [facility] [address], [place], [place] [address]    Report Status PENDING  Incomplete  Blood Culture (routine x 2)     Status: None (Preliminary result)   Collection Time: 09/29/18  4:30 PM   Specimen: BLOOD  Result Value Ref Range Status   Specimen Description   Final    BLOOD LEFT ARM Performed at [facility] [address] [place], [place] [address]    Special Requests   Final    BOTTLES DRAWN AEROBIC ONLY Blood Culture results may not be optimal due  to an inadequate volume of blood received in culture bottles Performed at [facility] [address], [place], [place] [address]    Culture   Final    NO GROWTH 2 DAYS Performed at [facility] [address], [place],  [address]    Report Status PENDING  Incomplete  MRSA PCR Screening     Status: Abnormal   Collection Time: 09/29/18  9:02 PM   Specimen: Nasal Mucosa; Nasopharyngeal  Result Value Ref Range Status   MRSA by PCR POSITIVE (A) NEGATIVE Final    Comment:        The GeneXpert MRSA Assay (  FDA approved for NASAL specimens only), is one component of a comprehensive MRSA colonization surveillance program. It is not intended to diagnose MRSA infection nor to guide or monitor treatment for MRSA infections. RESULT CALLED TO, READ BACK BY AND VERIFIED WITH: CASEY ELLIS @ 0071 ON 09/30/2018 C VARNER Performed at [facility], [place] [address], [place], [place] [address]          Radiology Studies: US Abdomen Limited  Result Date: 10/01/2018 CLINICAL DATA:  Ascites check EXAM: LIMITED ABDOMEN ULTRASOUND FOR ASCITES TECHNIQUE: Limited ultrasound survey for ascites was performed in all four abdominal quadrants. COMPARISON:  November 09, 2011. FINDINGS: Evaluation of the abdomen demonstrated no significant ascites. IMPRESSION: No significant ascites. Electronically Signed   By: Constance Holster M.D.   On: 10/01/2018 15:41   Dg Chest Port 1 View  Result Date: 09/29/2018 CLINICAL DATA:  Fever and tachycardia. EXAM: PORTABLE CHEST 1 VIEW COMPARISON:  Chest radiograph yesterday. FINDINGS: Patient is rotated. Mild left lung base atelectasis, similar to prior. No new focal airspace disease. Unchanged heart size and mediastinal contours. No pleural effusion, pulmonary edema, or pneumothorax. Unchanged osseous structures. IMPRESSION: Unchanged left basilar atelectasis. No new abnormality. Electronically Signed   By: Keith Rake  M.D.   On: 09/29/2018 17:49        Scheduled Meds: . apixaban  5 mg Oral BID  . aspirin EC  325 mg Oral Daily  . budesonide (PULMICORT) nebulizer solution  0.25 mg Nebulization BID  . buPROPion  300 mg Oral BH-[id number]  . calcium-vitamin D  1 tablet Oral Daily  . Chlorhexidine Gluconate Cloth  6 each Topical Q0600  . escitalopram  10 mg Oral Daily  . fluticasone  1 spray Each Nare BID  . guaiFENesin  600 mg Oral BID  . ipratropium-albuterol  3 mL Nebulization TID  . Melatonin  3 mg Oral QHS  . metoprolol tartrate  12.5 mg Oral BID  . mirabegron ER  50 mg Oral Daily  . mirtazapine  30 mg Oral QHS  . montelukast  10 mg Oral QHS  . mupirocin ointment  1 application Nasal BID  . polyvinyl alcohol  1 drop Both Eyes BID  . predniSONE  50 mg Oral Q breakfast   Continuous Infusions: . cefTRIAXone (ROCEPHIN)  IV 1 g (10/01/18 0337)  . vancomycin Stopped (09/30/18 2022)     LOS: 2 days    Time spent: [phone]   Hosie Poisson, MD [facility] Pager [phone]   If 7PM-7AM, please contact night-coverage [email or web address] Password [facility] 10/01/2018, 5:15 PM

## 2018-10-01 NOTE — Progress Notes (Signed)
Received report from previous RN. In agreement with charted assessment. Patient lying in bed with no complaints at this time. Will continue to monitor.

## 2018-10-01 NOTE — Progress Notes (Signed)
Resumed care of pt. Agree with previous RN's assessment. Will continue with plan of care.  

## 2018-10-02 DIAGNOSIS — N390 Urinary tract infection, site not specified: Principal | ICD-10-CM

## 2018-10-02 LAB — SARS CORONAVIRUS 2 BY RT PCR (HOSPITAL ORDER, PERFORMED IN ~~LOC~~ HOSPITAL LAB): SARS Coronavirus 2: NEGATIVE

## 2018-10-02 MED ORDER — IPRATROPIUM-ALBUTEROL 0.5-2.5 (3) MG/3ML IN SOLN: 3.0000 mL | Freq: Four times a day (QID) | RESPIRATORY_TRACT | 10 refills | Status: AC | PRN

## 2018-10-02 MED ORDER — PREDNISONE 20 MG PO TABS: ORAL_TABLET | ORAL | 0 refills | Status: AC

## 2018-10-02 MED ORDER — ACETAMINOPHEN 325 MG PO TABS: 650.0000 mg | ORAL_TABLET | Freq: Four times a day (QID) | ORAL | Status: AC | PRN

## 2018-10-02 MED ORDER — SODIUM CHLORIDE 0.9 % IV SOLN
INTRAVENOUS | Status: DC | PRN
  Administered 2018-10-02: 100 mL via INTRAVENOUS

## 2018-10-02 MED ORDER — CIPROFLOXACIN HCL 500 MG PO TABS: 500.0000 mg | ORAL_TABLET | Freq: Every day | ORAL | 0 refills | Status: AC

## 2018-10-02 MED ORDER — AMLODIPINE BESYLATE 2.5 MG PO TABS: 2.5000 mg | ORAL_TABLET | Freq: Every day | ORAL | 0 refills | Status: AC

## 2018-10-02 NOTE — Discharge Summary (Signed)
Physician Discharge Summary  [name] SHF:[id number] DOB: 10-Nov-1930 DOA: 09/28/2018  PCP: Reynold Bowen, MD  Admit date: 09/28/2018 Discharge date: 10/02/2018  [facility]: [facility]  Disposition:  [facility]  Recommendations for Outpatient Follow-up:  1. Follow up with PCP in 1-2 weeks 2. Please obtain BMP/CBC in one week  Discharge Condition:stable.  CODE STATUS:full code.  Diet recommendation: heart healthy.   Brief/Interim Summary: 86 year old lady with prior history of breast cancer in remission, hypertension, COPD, asthma, depression, spinal stenosis presented from [facility] with altered mental status and fevers and chills.  She was admitted for evaluation of urinary tract infection and acute COPD exacerbation.   Discharge Diagnoses:  Principal Problem:   Acute encephalopathy Active Problems:   Essential hypertension   Acute lower UTI   Acute on chronic respiratory failure with hypoxia (HCC)   Acute respiratory failure with hypoxia (HCC)   Acute metabolic encephalopathy  Acute on chronic respiratory failure with hypoxia secondary to acute COPD exacerbations Resolved and she is weaned off oxygen and wean her steroids in the next 5 to 7 days. Continue with duo nebs . She is afebrile and leukocytosis has resolved.   E. coli urinary tract infection  Completed 4 days of IV antibiotics and she remains afebrile and WBC count within normal limits.    History of pulmonary embolism Continue with Eliquis on discharge.   Patient reports some abdominal discomfort and distention. Ultrasound abdomen ordered to evaluate for ascites, which did not show sig ascites.    Essential hypertension bp parameters slightly elevated than usual, rpeat bp prior to discharge, and added 2.5 mg of norvasc for better bp control in addition to metoprolol.    Discharge Instructions  Discharge Instructions    Diet - low sodium heart healthy   Complete by: As directed     Discharge instructions   Complete by: As directed    Please follow up with PCP in one week.     Allergies as of 10/02/2018      Reactions   Oxycodone Hcl [oxycodone Hcl] Rash   Morphine Sulfate Other (See Comments)   REACTION: very anxious   Sulfamethoxazole Other (See Comments)   REACTION: unspecified      Medication List    STOP taking these medications   acetaminophen 650 MG CR tablet Commonly known as: TYLENOL Replaced by: acetaminophen 325 MG tablet   amoxicillin-clavulanate 875-125 MG tablet Commonly known as: AUGMENTIN   ibuprofen 400 MG tablet Commonly known as: ADVIL     TAKE these medications   acetaminophen 325 MG tablet Commonly known as: TYLENOL Take 2 tablets (650 mg total) by mouth every 6 (six) hours as needed for mild pain (or Fever >/= 101). Replaces: acetaminophen 650 MG CR tablet   albuterol 0.63 MG/3ML nebulizer solution Commonly known as: ACCUNEB Take 3 mLs (0.63 mg total) by nebulization 4 (four) times daily. What changed: Another medication with the same name was removed. Continue taking this medication, and follow the directions you see here.   amLODipine 2.5 MG tablet Commonly known as: NORVASC Take 1 tablet (2.5 mg total) by mouth daily.   apixaban 5 MG Tabs tablet Commonly known as: Eliquis Take 1 tablet (5 mg total) by mouth 2 (two) times daily.   aspirin EC 325 MG tablet Take 325 mg by mouth daily.   benzonatate 200 MG capsule Commonly known as: TESSALON Take 200 mg by mouth 3 (three) times daily as needed for cough.   Biofreeze 4 % Gel Generic drug:  Menthol (Topical Analgesic) Apply 1 application topically 3 (three) times daily as needed (pain).   buPROPion 300 MG 24 hr tablet Commonly known as: WELLBUTRIN XL Take 1 tablet (300 mg total) by mouth every morning.   Calcium+D3 600-800 MG-UNIT Tabs Generic drug: Calcium Carb-Cholecalciferol Take 1 tablet by mouth daily.   Cepacol Sore Throat 15-2.6 MG Lozg Generic drug:  Benzocaine-Menthol Use as directed 1 lozenge in the mouth or throat every 4 (four) hours as needed (for sore throat).   ciprofloxacin 500 MG tablet Commonly known as: Cipro Take 1 tablet (500 mg total) by mouth daily with breakfast. Start taking on: October 03, 2018   [facility] 200-5 MCG/ACT Aero Generic drug: mometasone-formoterol Inhale 1 puff into the lungs 2 (two) times daily.   escitalopram 10 MG tablet Commonly known as: LEXAPRO Take 1 tablet (10 mg total) by mouth daily.   fluticasone 50 MCG/ACT nasal spray Commonly known as: FLONASE Place 1 spray into both nostrils 2 (two) times daily.   food thickener Powd Commonly known as: THICK IT Use as needed for thickening fluids   guaiFENesin 600 MG 12 hr tablet Commonly known as: MUCINEX Take 600 mg by mouth 2 (two) times daily.   ipratropium-albuterol 0.5-2.5 (3) MG/3ML Soln Commonly known as: DUONEB Take 3 mLs by nebulization every 6 (six) hours as needed.   loratadine 10 MG tablet Commonly known as: CLARITIN Take 10 mg by mouth daily.   Melatonin 3 MG Tabs Take 3 mg by mouth at bedtime.   methocarbamol 500 MG tablet Commonly known as: ROBAXIN Take 500 mg by mouth every 8 (eight) hours as needed for muscle spasms.   metoprolol tartrate 25 MG tablet Commonly known as: LOPRESSOR Take 0.5 tablets (12.5 mg total) by mouth 2 (two) times daily.   mirtazapine 30 MG tablet Commonly known as: REMERON Take 30 mg by mouth at bedtime.   montelukast 10 MG tablet Commonly known as: SINGULAIR Take 10 mg by mouth at bedtime.   Myrbetriq 50 MG Tb24 tablet Generic drug: mirabegron ER Take 50 mg by mouth daily.   nystatin powder Generic drug: nystatin Apply 1 g topically 2 (two) times daily.   ondansetron 4 MG tablet Commonly known as: ZOFRAN Take 4 mg by mouth every 8 (eight) hours as needed for nausea or vomiting.   polyethylene glycol 17 g packet Commonly known as: MIRALAX / GLYCOLAX Take 17 g by mouth daily as  needed for mild constipation.   predniSONE 20 MG tablet Commonly known as: Deltasone Prednisone 40 mg daily for 3 days followed by  Prednisone 20 mg daily for 3 days followed by  Prednisone 10 mg daily for 3 days.   Propylene Glycol-Glycerin 0.6-0.6 % Soln Commonly known as: Soothe Apply 1 drop to eye 2 (two) times daily.   senna 8.6 MG Tabs tablet Commonly known as: SENOKOT Take 1 tablet by mouth daily as needed for mild constipation.   traMADol 50 MG tablet Commonly known as: ULTRAM Take 50 mg by mouth every 6 (six) hours as needed for moderate pain.       Allergies  Allergen Reactions  . Oxycodone Hcl [Oxycodone Hcl] Rash  . Morphine Sulfate Other (See Comments)    REACTION: very anxious  . Sulfamethoxazole Other (See Comments)    REACTION: unspecified    Consultations:  None.    Procedures/Studies: Ct Head Wo Contrast  Result Date: 09/18/2018 CLINICAL DATA:  Un witnessed fall. Patient on anticoagulation. Head trauma. EXAM: CT HEAD WITHOUT CONTRAST CT  CERVICAL SPINE WITHOUT CONTRAST TECHNIQUE: Multidetector CT imaging of the head and cervical spine was performed following the standard protocol without intravenous contrast. Multiplanar CT image reconstructions of the cervical spine were also generated. COMPARISON:  04/01/2017 FINDINGS: CT HEAD FINDINGS Brain: No evidence of acute infarction, hemorrhage, hydrocephalus, extra-axial collection or mass lesion/mass effect. There is ventricular sulcal enlargement reflecting moderate diffuse atrophy. Patchy periventricular white matter hypoattenuation is noted consistent with chronic microvascular ischemic change. Vascular: No hyperdense vessel or unexpected calcification. Skull: Normal. Negative for fracture or focal lesion. Sinuses/Orbits: Globes and orbits are unremarkable. Sinuses and mastoid air cells are clear. Other: None. CT CERVICAL SPINE FINDINGS Alignment: Straightened cervical lordosis.  No spondylolisthesis. Skull base  and vertebrae: No acute fracture. Status post anterior cervical disc fusion from C4 through C7. Mature bone spans the disc interspaces. The orthopedic hardware is well-seated with no evidence of loosening. A wire encircles the spinous processes from C4 through C7. These findings are stable from the prior CT. No bone lesion. Soft tissues and spinal canal: No prevertebral fluid or swelling. No visible canal hematoma. Disc levels: Mild-to-moderate loss of disc height at C2-C3. Marked loss of disc height with evidence of acquired fusion at the C 3-C4 level. Moderate loss of disc height at C7-T1. Bilateral facet degenerative changes with severe facet arthropathy on the left at C 1-C2. No convincing disc herniation. Upper chest: No acute findings.  Mild scarring at the lung apices. Other: None. IMPRESSION: HEAD CT 1. No acute intracranial abnormalities. 2. Atrophy and chronic microvascular ischemic change. 3. No skull fracture. CERVICAL CT 1. No fracture or acute finding. 2. Stable changes from previous cervical spine fusion. Stable degenerative changes. Electronically Signed   By: Lajean Manes M.D.   On: 09/18/2018 19:09   Ct Cervical Spine Wo Contrast  Result Date: 09/18/2018 CLINICAL DATA:  Un witnessed fall. Patient on anticoagulation. Head trauma. EXAM: CT HEAD WITHOUT CONTRAST CT CERVICAL SPINE WITHOUT CONTRAST TECHNIQUE: Multidetector CT imaging of the head and cervical spine was performed following the standard protocol without intravenous contrast. Multiplanar CT image reconstructions of the cervical spine were also generated. COMPARISON:  04/01/2017 FINDINGS: CT HEAD FINDINGS Brain: No evidence of acute infarction, hemorrhage, hydrocephalus, extra-axial collection or mass lesion/mass effect. There is ventricular sulcal enlargement reflecting moderate diffuse atrophy. Patchy periventricular white matter hypoattenuation is noted consistent with chronic microvascular ischemic change. Vascular: No hyperdense  vessel or unexpected calcification. Skull: Normal. Negative for fracture or focal lesion. Sinuses/Orbits: Globes and orbits are unremarkable. Sinuses and mastoid air cells are clear. Other: None. CT CERVICAL SPINE FINDINGS Alignment: Straightened cervical lordosis.  No spondylolisthesis. Skull base and vertebrae: No acute fracture. Status post anterior cervical disc fusion from C4 through C7. Mature bone spans the disc interspaces. The orthopedic hardware is well-seated with no evidence of loosening. A wire encircles the spinous processes from C4 through C7. These findings are stable from the prior CT. No bone lesion. Soft tissues and spinal canal: No prevertebral fluid or swelling. No visible canal hematoma. Disc levels: Mild-to-moderate loss of disc height at C2-C3. Marked loss of disc height with evidence of acquired fusion at the C 3-C4 level. Moderate loss of disc height at C7-T1. Bilateral facet degenerative changes with severe facet arthropathy on the left at C 1-C2. No convincing disc herniation. Upper chest: No acute findings.  Mild scarring at the lung apices. Other: None. IMPRESSION: HEAD CT 1. No acute intracranial abnormalities. 2. Atrophy and chronic microvascular ischemic change. 3. No skull fracture. CERVICAL  CT 1. No fracture or acute finding. 2. Stable changes from previous cervical spine fusion. Stable degenerative changes. Electronically Signed   By: Lajean Manes M.D.   On: 09/18/2018 19:09   US Abdomen Limited  Result Date: 10/01/2018 CLINICAL DATA:  Ascites check EXAM: LIMITED ABDOMEN ULTRASOUND FOR ASCITES TECHNIQUE: Limited ultrasound survey for ascites was performed in all four abdominal quadrants. COMPARISON:  November 09, 2011. FINDINGS: Evaluation of the abdomen demonstrated no significant ascites. IMPRESSION: No significant ascites. Electronically Signed   By: Constance Holster M.D.   On: 10/01/2018 15:41   Dg Chest Port 1 View  Result Date: 09/29/2018 CLINICAL DATA:  Fever  and tachycardia. EXAM: PORTABLE CHEST 1 VIEW COMPARISON:  Chest radiograph yesterday. FINDINGS: Patient is rotated. Mild left lung base atelectasis, similar to prior. No new focal airspace disease. Unchanged heart size and mediastinal contours. No pleural effusion, pulmonary edema, or pneumothorax. Unchanged osseous structures. IMPRESSION: Unchanged left basilar atelectasis. No new abnormality. Electronically Signed   By: Keith Rake M.D.   On: 09/29/2018 17:49   Dg Chest Port 1 View  Result Date: 09/28/2018 CLINICAL DATA:  Altered mental status, shortness of breath EXAM: PORTABLE CHEST 1 VIEW COMPARISON:  06/12/2017 FINDINGS: Mild left basilar opacity, likely atelectasis. Right lung is clear. No pleural effusion or pneumothorax. The heart is normal in size. Cervical spine fixation hardware. IMPRESSION: No evidence of acute cardiopulmonary disease. Electronically Signed   By: Julian Hy M.D.   On: 09/28/2018 23:02   Dg Knee Complete 4 Views Right  Result Date: 09/18/2018 CLINICAL DATA:  Unwitnessed fall.  Knee pain EXAM: RIGHT KNEE - COMPLETE 4+ VIEW COMPARISON:  None. FINDINGS: Small joint effusion. Prior right knee replacement. No acute bony abnormality. Specifically, no fracture, subluxation, or dislocation. IMPRESSION: No acute bony abnormality. Electronically Signed   By: Rolm Baptise M.D.   On: 09/18/2018 18:59   Dg Foot Complete Left  Result Date: 09/18/2018 CLINICAL DATA:  Recent fall with first toe pain, initial encounter EXAM: LEFT FOOT - COMPLETE 3+ VIEW COMPARISON:  None. FINDINGS: Generalized osteopenia is noted. No soft tissue abnormality is seen. Cortical irregularity is noted along the lateral aspect of the first distal phalanx consistent with undisplaced fracture. No other definitive fracture is seen. IMPRESSION: Fracture at the base of the first distal phalanx laterally. Electronically Signed   By: Inez Catalina M.D.   On: 09/18/2018 19:02       Subjective: No chest pain  or sob. No nausea, vomiting or abdominal pain.   Discharge Exam: Vitals:   10/02/18 0609 10/02/18 0721  BP: (!) 164/97   Pulse: 70   Resp: 16   Temp: 97.7 F (36.5 C)   SpO2: 96% 94%   Vitals:   10/01/18 2017 10/01/18 2035 10/02/18 0609 10/02/18 0721  BP:  (!) 163/89 (!) 164/97   Pulse:  86 70   Resp:  20 16   Temp:  98 F (36.7 C) 97.7 F (36.5 C)   TempSrc:  Oral Oral   SpO2: 98% 94% 96% 94%  Weight:      Height:        General: Pt is alert, awake, not in acute distress Cardiovascular: RRR, S1/S2 +, no rubs, no gallops Respiratory: CTA bilaterally, no wheezing, no rhonchi Abdominal: Soft, NT, ND, bowel sounds + Extremities: no edema, no cyanosis    The results of significant diagnostics from this hospitalization (including imaging, microbiology, ancillary and laboratory) are listed below for reference.  Microbiology: Recent Results (from the past 240 hour(s))  SARS Coronavirus 2 [facility] order, Performed in [facility] hospital lab) Nasopharyngeal Nasopharyngeal Swab     Status: None   Collection Time: 09/28/18 11:22 PM   Specimen: Nasopharyngeal Swab  Result Value Ref Range Status   SARS Coronavirus 2 NEGATIVE NEGATIVE Final    Comment: (NOTE) If result is NEGATIVE SARS-CoV-2 target nucleic acids are NOT DETECTED. The SARS-CoV-2 RNA is generally detectable in upper and lower  respiratory specimens during the acute phase of infection. The lowest  concentration of SARS-CoV-2 viral copies this assay can detect is 250  copies / mL. A negative result does not preclude SARS-CoV-2 infection  and should not be used as the sole basis for treatment or other  patient management decisions.  A negative result may occur with  improper specimen collection / handling, submission of specimen other  than nasopharyngeal swab, presence of viral mutation(s) within the  areas targeted by this assay, and inadequate number of viral copies  (<250 copies / mL). A negative result  must be combined with clinical  observations, patient history, and epidemiological information. If result is POSITIVE SARS-CoV-2 target nucleic acids are DETECTED. The SARS-CoV-2 RNA is generally detectable in upper and lower  respiratory specimens dur ing the acute phase of infection.  Positive  results are indicative of active infection with SARS-CoV-2.  Clinical  correlation with patient history and other diagnostic information is  necessary to determine patient infection status.  Positive results do  not rule out bacterial infection or co-infection with other viruses. If result is PRESUMPTIVE POSTIVE SARS-CoV-2 nucleic acids MAY BE PRESENT.   A presumptive positive result was obtained on the submitted specimen  and confirmed on repeat testing.  While 2019 novel coronavirus  (SARS-CoV-2) nucleic acids may be present in the submitted sample  additional confirmatory testing may be necessary for epidemiological  and / or clinical management purposes  to differentiate between  SARS-CoV-2 and other Sarbecovirus currently known to infect humans.  If clinically indicated additional testing with an alternate test  methodology [phone]) is advised. The SARS-CoV-2 RNA is generally  detectable in upper and lower respiratory sp ecimens during the acute  phase of infection. The expected result is Negative. Fact Sheet for Patients:  StrictlyIdeas.no Fact Sheet for Healthcare Providers: BankingDealers.co.za This test is not yet approved or cleared by the [place] FDA and has been authorized for detection and/or diagnosis of SARS-CoV-2 by FDA under an Emergency Use Authorization (EUA).  This EUA will remain in effect (meaning this test can be used) for the duration of the COVID-19 declaration under Section 564(b)(1) of the Act, 21 U.S.C. section 360bbb-3(b)(1), unless the authorization is terminated or revoked sooner. Performed at [facility], [place] [address] [place], [place] [address]   Urine culture     Status: Abnormal   Collection Time: 09/29/18  3:22 AM   Specimen: Urine, Random  Result Value Ref Range Status   Specimen Description   Final    URINE, RANDOM Performed at [facility] [address], [place], [place] [address]    Special Requests   Final    NONE Performed at [facility], [place] [address], [place], [place] [address]    Culture >=100,000 COLONIES/mL ESCHERICHIA COLI (A)  Final   Report Status 09/30/2018 FINAL  Final   Organism ID, Bacteria ESCHERICHIA COLI (A)  Final      Susceptibility   Escherichia coli - MIC*  AMPICILLIN >=32 RESISTANT Resistant     CEFAZOLIN >=64 RESISTANT Resistant     CEFTRIAXONE <=1 SENSITIVE Sensitive     CIPROFLOXACIN <=0.25 SENSITIVE Sensitive     GENTAMICIN <=1 SENSITIVE Sensitive     IMIPENEM <=0.25 SENSITIVE Sensitive     NITROFURANTOIN 64 INTERMEDIATE Intermediate     TRIMETH/SULFA >=320 RESISTANT Resistant     AMPICILLIN/SULBACTAM >=32 RESISTANT Resistant     PIP/TAZO 64 INTERMEDIATE Intermediate     Extended ESBL NEGATIVE Sensitive     * >=100,000 COLONIES/mL ESCHERICHIA COLI  Blood Culture (routine x 2)     Status: None (Preliminary result)   Collection Time: 09/29/18  5:50 AM   Specimen: BLOOD LEFT FOREARM  Result Value Ref Range Status   Specimen Description   Final    BLOOD LEFT FOREARM Performed at [facility] [address] [place], [place] [address]    Special Requests   Final    BOTTLES DRAWN AEROBIC AND ANAEROBIC Blood Culture adequate volume Performed at [facility] [address], [place], [place] [address]    Culture   Final    NO GROWTH 2 DAYS Performed at [facility] [address] [place], [place] [address]    Report Status PENDING  Incomplete  Blood Culture (routine x 2)     Status: None (Preliminary result)   Collection Time:  09/29/18  4:30 PM   Specimen: BLOOD  Result Value Ref Range Status   Specimen Description   Final    BLOOD LEFT ARM Performed at [facility] [address] [place], [place] [address]    Special Requests   Final    BOTTLES DRAWN AEROBIC ONLY Blood Culture results may not be optimal due to an inadequate volume of blood received in culture bottles Performed at [facility] [address] [place], [place] [address]    Culture   Final    NO GROWTH 2 DAYS Performed at [facility] [address], [place], [place] [address]    Report Status PENDING  Incomplete  MRSA PCR Screening     Status: Abnormal   Collection Time: 09/29/18  9:02 PM   Specimen: Nasal Mucosa; Nasopharyngeal  Result Value Ref Range Status   MRSA by PCR POSITIVE (A) NEGATIVE Final    Comment:        The GeneXpert MRSA Assay (FDA approved for NASAL specimens only), is one component of a comprehensive MRSA colonization surveillance program. It is not intended to diagnose MRSA infection nor to guide or monitor treatment for MRSA infections. RESULT CALLED TO, READ BACK BY AND VERIFIED WITH: CASEY ELLIS @ [id number] ON 09/30/2018 C VARNER Performed at [facility], [place] [address], [place], [place] [address]   SARS Coronavirus 2 [facility] order, Performed in [facility] hospital lab)     Status: None   Collection Time: 10/02/18  2:35 AM  Result Value Ref Range Status   SARS Coronavirus 2 NEGATIVE NEGATIVE Final    Comment: (NOTE) If result is NEGATIVE SARS-CoV-2 target nucleic acids are NOT DETECTED. The SARS-CoV-2 RNA is generally detectable in upper and lower  respiratory specimens during the acute phase of infection. The lowest  concentration of SARS-CoV-2 viral copies this assay can detect is 250  copies / mL. A negative result does not preclude SARS-CoV-2 infection  and should not be used as the sole basis for treatment or other  patient management  decisions.  A negative result may occur with  improper specimen collection / handling, submission of specimen other  than nasopharyngeal swab, presence of viral mutation(s) within the  areas targeted by this assay, and inadequate number of viral copies  (<250 copies / mL). A negative result must be combined with clinical  observations, patient history, and epidemiological information. If result is POSITIVE SARS-CoV-2 target nucleic acids are DETECTED. The SARS-CoV-2 RNA is generally detectable in upper and lower  respiratory specimens dur ing the acute phase of infection.  Positive  results are indicative of active infection with SARS-CoV-2.  Clinical  correlation with patient history and other diagnostic information is  necessary to determine patient infection status.  Positive results do  not rule out bacterial infection or co-infection with other viruses. If result is PRESUMPTIVE POSTIVE SARS-CoV-2 nucleic acids MAY BE PRESENT.   A presumptive positive result was obtained on the submitted specimen  and confirmed on repeat testing.  While 2019 novel coronavirus  (SARS-CoV-2) nucleic acids may be present in the submitted sample  additional confirmatory testing may be necessary for epidemiological  and / or clinical management purposes  to differentiate between  SARS-CoV-2 and other Sarbecovirus currently known to infect humans.  If clinically indicated additional testing with an alternate test  methodology [phone]) is advised. The SARS-CoV-2 RNA is generally  detectable in upper and lower respiratory sp ecimens during the acute  phase of infection. The expected result is Negative. Fact Sheet for Patients:  StrictlyIdeas.no Fact Sheet for Healthcare Providers: BankingDealers.co.za This test is not yet approved or cleared by the [place] FDA and has been authorized for detection and/or diagnosis of SARS-CoV-2 by FDA under an  Emergency Use Authorization (EUA).  This EUA will remain in effect (meaning this test can be used) for the duration of the COVID-19 declaration under Section 564(b)(1) of the Act, 21 U.S.C. section 360bbb-3(b)(1), unless the authorization is terminated or revoked sooner. Performed at [facility], [place] [address] [place], [place] [address]      Labs: BNP (last 3 results) Recent Labs    09/29/18 0202  BNP 967.8*   Basic Metabolic Panel: Recent Labs  Lab 09/29/18 0202 09/29/18 1650 09/30/18 0527 10/01/18 0516  NA 138 138 140 142  K 3.8 3.9 3.7 4.4  CL 105 102 107 111  CO2 23 24 24 25   GLUCOSE 167* 108* 145* 145*  BUN 20 23 22 18   CREATININE 1.03* 0.91 0.73 0.58  CALCIUM 9.1 9.5 9.0 9.0   Liver Function Tests: Recent Labs  Lab 09/29/18 1650  AST 27  ALT 18  ALKPHOS 98  BILITOT 0.4  PROT 7.0  ALBUMIN 3.2*   No results for input(s): LIPASE, AMYLASE in the last 168 hours. No results for input(s): AMMONIA in the last 168 hours. CBC: Recent Labs  Lab 09/29/18 0202 09/29/18 1650 09/30/18 0527 10/01/18 0516  WBC 25.9* 29.8* 17.6* 9.6  NEUTROABS 24.7* 27.3*  --   --   HGB 13.7 13.9 12.3 11.4*  HCT 42.5 44.2 39.5 34.8*  MCV 100.2* 100.9* 102.3* 98.9  PLT 231 273 207 208   Cardiac Enzymes: No results for input(s): CKTOTAL, CKMB, CKMBINDEX, TROPONINI in the last 168 hours. BNP: Invalid input(s): POCBNP CBG: No results for input(s): GLUCAP in the last 168 hours. D-Dimer No results for input(s): DDIMER in the last 72 hours. Hgb A1c No results for input(s): HGBA1C in the last 72 hours. Lipid Profile No results for input(s): CHOL, HDL, LDLCALC, TRIG, CHOLHDL,  LDLDIRECT in the last 72 hours. Thyroid function studies No results for input(s): TSH, T4TOTAL, T3FREE, THYROIDAB in the last 72 hours.  Invalid input(s): FREET3 Anemia work up No results for input(s): VITAMINB12, FOLATE, FERRITIN, TIBC, IRON, RETICCTPCT in the last 72  hours. Urinalysis    Component Value Date/Time   COLORURINE YELLOW 09/29/2018 0322   APPEARANCEUR HAZY (A) 09/29/2018 0322   LABSPEC 1.011 09/29/2018 0322   PHURINE 5.0 09/29/2018 0322   GLUCOSEU NEGATIVE 09/29/2018 0322   HGBUR SMALL (A) 09/29/2018 0322   BILIRUBINUR NEGATIVE 09/29/2018 0322   BILIRUBINUR n 09/25/2010 1525   KETONESUR NEGATIVE 09/29/2018 0322   PROTEINUR 100 (A) 09/29/2018 0322   UROBILINOGEN 0.2 09/25/2010 1525   UROBILINOGEN 0.2 01/04/2007 0900   NITRITE NEGATIVE 09/29/2018 0322   LEUKOCYTESUR LARGE (A) 09/29/2018 0322   Sepsis Labs Invalid input(s): PROCALCITONIN,  WBC,  LACTICIDVEN Microbiology Recent Results (from the past 240 hour(s))  SARS Coronavirus 2 [facility] order, Performed in [facility] hospital lab) Nasopharyngeal Nasopharyngeal Swab     Status: None   Collection Time: 09/28/18 11:22 PM   Specimen: Nasopharyngeal Swab  Result Value Ref Range Status   SARS Coronavirus 2 NEGATIVE NEGATIVE Final    Comment: (NOTE) If result is NEGATIVE SARS-CoV-2 target nucleic acids are NOT DETECTED. The SARS-CoV-2 RNA is generally detectable in upper and lower  respiratory specimens during the acute phase of infection. The lowest  concentration of SARS-CoV-2 viral copies this assay can detect is 250  copies / mL. A negative result does not preclude SARS-CoV-2 infection  and should not be used as the sole basis for treatment or other  patient management decisions.  A negative result may occur with  improper specimen collection / handling, submission of specimen other  than nasopharyngeal swab, presence of viral mutation(s) within the  areas targeted by this assay, and inadequate number of viral copies  (<250 copies / mL). A negative result must be combined with clinical  observations, patient history, and epidemiological information. If result is POSITIVE SARS-CoV-2 target nucleic acids are DETECTED. The SARS-CoV-2 RNA is generally detectable in upper and  lower  respiratory specimens dur ing the acute phase of infection.  Positive  results are indicative of active infection with SARS-CoV-2.  Clinical  correlation with patient history and other diagnostic information is  necessary to determine patient infection status.  Positive results do  not rule out bacterial infection or co-infection with other viruses. If result is PRESUMPTIVE POSTIVE SARS-CoV-2 nucleic acids MAY BE PRESENT.   A presumptive positive result was obtained on the submitted specimen  and confirmed on repeat testing.  While 2019 novel coronavirus  (SARS-CoV-2) nucleic acids may be present in the submitted sample  additional confirmatory testing may be necessary for epidemiological  and / or clinical management purposes  to differentiate between  SARS-CoV-2 and other Sarbecovirus currently known to infect humans.  If clinically indicated additional testing with an alternate test  methodology [id number]) is advised. The SARS-CoV-2 RNA is generally  detectable in upper and lower respiratory sp ecimens during the acute  phase of infection. The expected result is Negative. Fact Sheet for Patients:  StrictlyIdeas.no Fact Sheet for Healthcare Providers: BankingDealers.co.za This test is not yet approved or cleared by the [place] FDA and has been authorized for detection and/or diagnosis of SARS-CoV-2 by FDA under an Emergency Use Authorization (EUA).  This EUA will remain in effect (meaning this test can be used) for the duration of the COVID-19 declaration under  Section 564(b)(1) of the Act, 21 U.S.C. section 360bbb-3(b)(1), unless the authorization is terminated or revoked sooner. Performed at [facility], [place] [address], [place], [place] [address]   Urine culture     Status: Abnormal   Collection Time: 09/29/18  3:22 AM   Specimen: Urine, Random  Result Value Ref Range Status   Specimen Description    Final    URINE, RANDOM Performed at [place] [address] [place], [place] [address]    Special Requests   Final    NONE Performed at [facility], [place] [address] [place], [place] [address]    Culture >=100,000 COLONIES/mL ESCHERICHIA COLI (A)  Final   Report Status 09/30/2018 FINAL  Final   Organism ID, Bacteria ESCHERICHIA COLI (A)  Final      Susceptibility   Escherichia coli - MIC*    AMPICILLIN >=32 RESISTANT Resistant     CEFAZOLIN >=64 RESISTANT Resistant     CEFTRIAXONE <=1 SENSITIVE Sensitive     CIPROFLOXACIN <=0.25 SENSITIVE Sensitive     GENTAMICIN <=1 SENSITIVE Sensitive     IMIPENEM <=0.25 SENSITIVE Sensitive     NITROFURANTOIN 64 INTERMEDIATE Intermediate     TRIMETH/SULFA >=320 RESISTANT Resistant     AMPICILLIN/SULBACTAM >=32 RESISTANT Resistant     PIP/TAZO 64 INTERMEDIATE Intermediate     Extended ESBL NEGATIVE Sensitive     * >=100,000 COLONIES/mL ESCHERICHIA COLI  Blood Culture (routine x 2)     Status: None (Preliminary result)   Collection Time: 09/29/18  5:50 AM   Specimen: BLOOD LEFT FOREARM  Result Value Ref Range Status   Specimen Description   Final    BLOOD LEFT FOREARM Performed at [place] [address], [place], [place] [address]    Special Requests   Final    BOTTLES DRAWN AEROBIC AND ANAEROBIC Blood Culture adequate volume Performed at [place] [address] [place], [place] [address]    Culture   Final    NO GROWTH 2 DAYS Performed at [place] [address] [place], [place] [address]    Report Status PENDING  Incomplete  Blood Culture (routine x 2)     Status: None (Preliminary result)   Collection Time: 09/29/18  4:30 PM   Specimen: BLOOD  Result Value Ref Range Status   Specimen Description   Final    BLOOD LEFT ARM Performed at [place] [address], [place] [place] [employer], [place] [address]    Special  Requests   Final    BOTTLES DRAWN AEROBIC ONLY Blood Culture results may not be optimal due to an inadequate volume of blood received in culture bottles Performed at [place] [address] [place], [place] [address]    Culture   Final    NO GROWTH 2 DAYS Performed at [place] [address], [place], [place] [address]    Report Status PENDING  Incomplete  MRSA PCR Screening     Status: Abnormal   Collection Time: 09/29/18  9:02 PM   Specimen: Nasal Mucosa; Nasopharyngeal  Result Value Ref Range Status   MRSA by PCR POSITIVE (A) NEGATIVE Final    Comment:        The GeneXpert MRSA Assay (FDA approved for NASAL specimens only), is one component of a comprehensive MRSA colonization surveillance program. It is not intended to diagnose MRSA infection nor to guide or monitor treatment for  MRSA infections. RESULT CALLED TO, READ BACK BY AND VERIFIED WITH: CASEY ELLIS @ 1610 ON 09/30/2018 C VARNER Performed at [facility], [place] [address], [place], [place] [address]   SARS Coronavirus 2 [facility] order, Performed in [facility] hospital lab)     Status: None   Collection Time: 10/02/18  2:35 AM  Result Value Ref Range Status   SARS Coronavirus 2 NEGATIVE NEGATIVE Final    Comment: (NOTE) If result is NEGATIVE SARS-CoV-2 target nucleic acids are NOT DETECTED. The SARS-CoV-2 RNA is generally detectable in upper and lower  respiratory specimens during the acute phase of infection. The lowest  concentration of SARS-CoV-2 viral copies this assay can detect is 250  copies / mL. A negative result does not preclude SARS-CoV-2 infection  and should not be used as the sole basis for treatment or other  patient management decisions.  A negative result may occur with  improper specimen collection / handling, submission of specimen other  than nasopharyngeal swab, presence of viral mutation(s) within the  areas targeted by this assay, and  inadequate number of viral copies  (<250 copies / mL). A negative result must be combined with clinical  observations, patient history, and epidemiological information. If result is POSITIVE SARS-CoV-2 target nucleic acids are DETECTED. The SARS-CoV-2 RNA is generally detectable in upper and lower  respiratory specimens dur ing the acute phase of infection.  Positive  results are indicative of active infection with SARS-CoV-2.  Clinical  correlation with patient history and other diagnostic information is  necessary to determine patient infection status.  Positive results do  not rule out bacterial infection or co-infection with other viruses. If result is PRESUMPTIVE POSTIVE SARS-CoV-2 nucleic acids MAY BE PRESENT.   A presumptive positive result was obtained on the submitted specimen  and confirmed on repeat testing.  While 2019 novel coronavirus  (SARS-CoV-2) nucleic acids may be present in the submitted sample  additional confirmatory testing may be necessary for epidemiological  and / or clinical management purposes  to differentiate between  SARS-CoV-2 and other Sarbecovirus currently known to infect humans.  If clinically indicated additional testing with an alternate test  methodology [phone]) is advised. The SARS-CoV-2 RNA is generally  detectable in upper and lower respiratory sp ecimens during the acute  phase of infection. The expected result is Negative. Fact Sheet for Patients:  StrictlyIdeas.no Fact Sheet for Healthcare Providers: BankingDealers.co.za This test is not yet approved or cleared by the [place] FDA and has been authorized for detection and/or diagnosis of SARS-CoV-2 by FDA under an Emergency Use Authorization (EUA).  This EUA will remain in effect (meaning this test can be used) for the duration of the COVID-19 declaration under Section 564(b)(1) of the Act, 21 U.S.C. section 360bbb-3(b)(1), unless the  authorization is terminated or revoked sooner. Performed at [facility], [place] [address], [place], [place] [address]      Time coordinating discharge: 32  minutes  SIGNED:   Hosie Poisson, MD  [facility] 10/02/2018, 9:26 AM Pager   If 7PM-7AM, please contact night-coverage [email or web address] Password [email or web address]

## 2018-10-02 NOTE — Progress Notes (Signed)
Patient called out into hallway. Went to check on patient, she stated "I need help getting out of here". I asked patient where she was, she stated "I'm at a restaurant, I'm standing up, please help me". Patient was reoriented to being at [facility], in the hospital, and reassured she was in bed. Patient understood. Respositioned call light on top of chest so that she would be able to find it easily. Bed left in lowest position. Will continue to monitor patient.

## 2018-10-02 NOTE — TOC Transition Note (Signed)
Transition of Care [facility]) - CM/SW Discharge Note   Patient Details  Name: NYKIRA REDDIX MRN: [id number] Date of Birth: 09-08-1930  [facility] CM/SW Contact:  Greg Cutter, LCSW Phone Number: 10/02/2018, 5:20 PM   Clinical Narrative:   LCSW spoke with Abigail Butts at [facility]. She is agreeable to take patient now that family came to the facility and signed paperwork. LCSW faxed her patient's discharge summary. LCSW completed call to [facility] and made transportation arrangements. [facility] will transport patient to [facility] today on 10/02/2018.   Final next level of care: Skilled Nursing Facility Barriers to Discharge: Continued Medical Work up   Discharge Plan and Services In-house Referral: Clinical Social Work    Readmission Risk Interventions No flowsheet data found.

## 2018-10-02 NOTE — Progress Notes (Signed)
Pharmacy Antibiotic Note  [name] is a 85 y.o. female admitted on 09/28/2018 with altered mental status, fever, chills, and shortness of breath. Patient initially placed on Ceftriaxone for UTI. Once patient arrived to the floor, patient had new fever with increased HR and RR. MD broadened antibiotic coverage for suspected sepsis. Pharmacy has been consulted for Vancomycin dosing.  Continues on Vancomycin and Ceftriaxone Found to have E Coli UTI Currently afebrile, WBC WNL  Plan: Continue Vancomycin 1250 mg iv Q 24 - consider stopping Day #4 Continue Ceftriaxone 1g IV q24h per MD. Monitor renal function, cultures, clinical course.  Height: 5\' 8"  (172.7 cm) Weight: 217 lb 13 oz (98.8 kg) IBW/kg (Calculated) : 63.9  Temp (24hrs), Avg:98 F (36.7 C), Min:97.7 F (36.5 C), Max:98.4 F (36.9 C)  Recent Labs  Lab 09/29/18 0202 09/29/18 1120 09/29/18 1650 09/30/18 0527 10/01/18 0516  WBC 25.9*  --  29.8* 17.6* 9.6  CREATININE 1.03*  --  0.91 0.73 0.58  LATICACIDVEN  --  1.2  --   --   --     Estimated Creatinine Clearance: 62.1 mL/min (by C-G formula based on SCr of 0.58 mg/dL).    Allergies  Allergen Reactions  . Oxycodone Hcl [Oxycodone Hcl] Rash  . Morphine Sulfate Other (See Comments)    REACTION: very anxious  . Sulfamethoxazole Other (See Comments)    REACTION: unspecified    Antimicrobials this admission: 8/13 Ceftriaxone >>  8/13 Vancomycin >>   Microbiology results: 8/12 COVID: negative 8/13 BCx: NGTD 8/13 UCx: E coli UTI   Thank you for allowing pharmacy to be a part of this patient's care. Anette Guarneri, PharmD 10/02/2018 7:54 AM

## 2018-10-02 NOTE — TOC Progression Note (Signed)
Transition of Care [facility]) - Progression Note    Patient Details  Name: [name] MRN: [id number] Date of Birth: [birth year]  Transition of Care [facility] CM/SW [place], [place] Phone Number: 10/02/2018, 11:59 AM  Clinical Narrative:   LCSW contacted Abigail Butts at [facility] on 10/02/2018. She reports that they do have one long term bed available today but states that family did not sign anything to secure her long term bed so paperwork will need to be completed first. Abigail Butts was notified that patient is ready for discharged from hospital and to keep inpatient team updated. Abigail Butts will have to contact family first before they are agreeable to take patient back.   Expected Discharge Plan: [place] Barriers to Discharge: Continued Medical Work up  Expected Discharge Plan and Services Expected Discharge Plan: [place] In-house Referral: Clinical Social Work     Living arrangements for the past 2 months: [place] Expected Discharge Date: 10/02/18                   Readmission Risk Interventions No flowsheet data found.

## 2018-10-04 LAB — CULTURE, BLOOD (ROUTINE X 2)
Culture: NO GROWTH
Culture: NO GROWTH
Special Requests: ADEQUATE

## 2018-10-21 IMAGING — CR DG CHEST 1V PORT
1 series · 1 of 1 positions shown · non-contrast
Comparison: August 12, 2016

CLINICAL DATA: Respiratory distress

EXAM:
PORTABLE CHEST 1 VIEW

[AP]
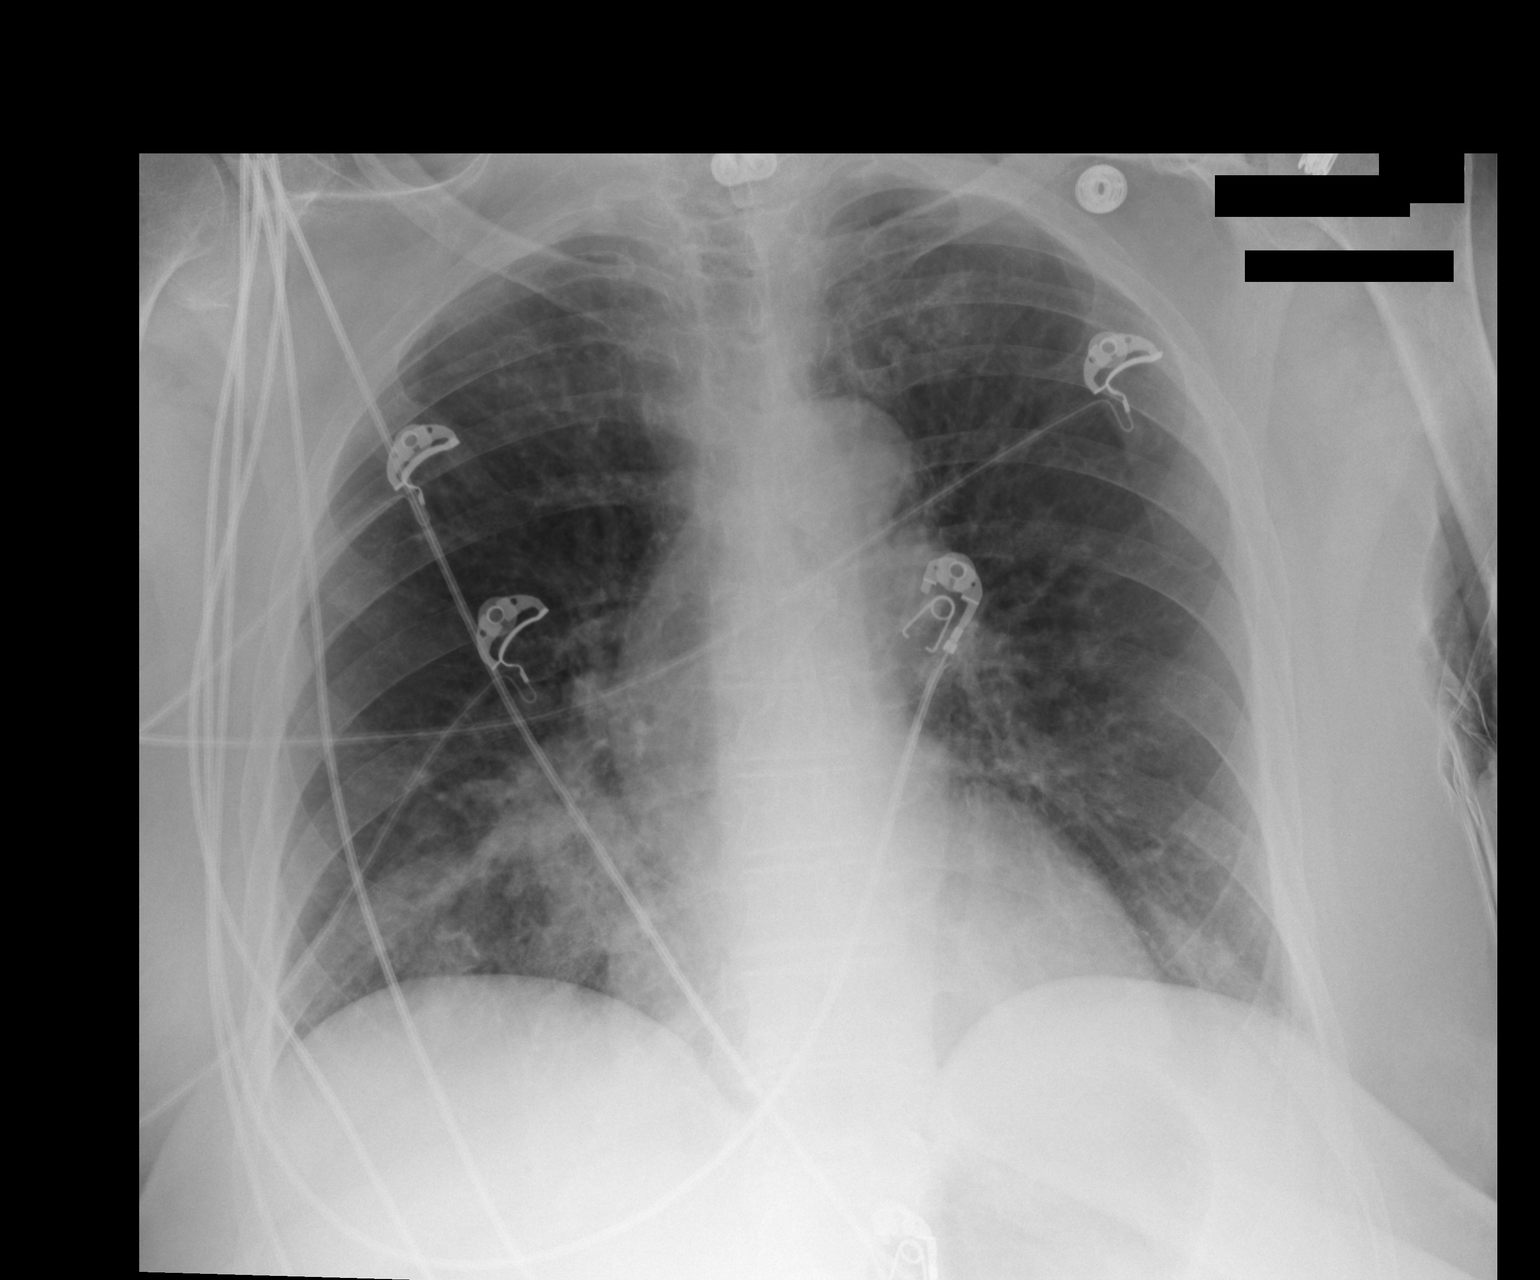

[1 of 1 positions shown; findings below may reference images not displayed]

FINDINGS: No pneumothorax. The heart, hila, and mediastinum are normal.
Increasing opacity in the medial right base favored represent
infiltrate rather than a vascular crowding. No other interval
changes or acute abnormalities.
IMPRESSION: Developing infiltrate in the medial right lung base. Recommend
follow-up to resolution.

## 2018-10-30 ENCOUNTER — Other Ambulatory Visit: Payer: Self-pay

## 2018-10-30 ENCOUNTER — Encounter (HOSPITAL_COMMUNITY): Payer: Self-pay | Admitting: Emergency Medicine

## 2018-10-30 ENCOUNTER — Emergency Department (HOSPITAL_COMMUNITY)
Admission: EM | Admit: 2018-10-30 | Discharge: 2018-10-31 | Disposition: A | Discharge status: Skilled Nursing Facility | Payer: Medicare Other | Attending: Emergency Medicine | Admitting: Emergency Medicine

## 2018-10-30 ENCOUNTER — Emergency Department (HOSPITAL_COMMUNITY): Payer: Medicare Other

## 2018-10-30 DIAGNOSIS — Z79899 Other long term (current) drug therapy: Secondary | ICD-10-CM | POA: Diagnosis not present

## 2018-10-30 DIAGNOSIS — Z87891 Personal history of nicotine dependence: Secondary | ICD-10-CM | POA: Insufficient documentation

## 2018-10-30 DIAGNOSIS — Z853 Personal history of malignant neoplasm of breast: Secondary | ICD-10-CM | POA: Insufficient documentation

## 2018-10-30 DIAGNOSIS — R1084 Generalized abdominal pain: Secondary | ICD-10-CM | POA: Diagnosis not present

## 2018-10-30 DIAGNOSIS — Z20828 Contact with and (suspected) exposure to other viral communicable diseases: Secondary | ICD-10-CM | POA: Insufficient documentation

## 2018-10-30 DIAGNOSIS — R109 Unspecified abdominal pain: Secondary | ICD-10-CM

## 2018-10-30 DIAGNOSIS — Z7982 Long term (current) use of aspirin: Secondary | ICD-10-CM | POA: Insufficient documentation

## 2018-10-30 DIAGNOSIS — J45909 Unspecified asthma, uncomplicated: Secondary | ICD-10-CM | POA: Insufficient documentation

## 2018-10-30 DIAGNOSIS — Z7901 Long term (current) use of anticoagulants: Secondary | ICD-10-CM | POA: Diagnosis not present

## 2018-10-30 DIAGNOSIS — I10 Essential (primary) hypertension: Secondary | ICD-10-CM | POA: Insufficient documentation

## 2018-10-30 DIAGNOSIS — R0602 Shortness of breath: Secondary | ICD-10-CM | POA: Insufficient documentation

## 2018-10-30 DIAGNOSIS — K5641 Fecal impaction: Secondary | ICD-10-CM

## 2018-10-30 LAB — CBC WITH DIFFERENTIAL/PLATELET
Abs Immature Granulocytes: 0.04 10*3/uL (ref 0.00–0.07)
Basophils Absolute: 0.1 10*3/uL (ref 0.0–0.1)
Basophils Relative: 1 %
Eosinophils Absolute: 0.2 10*3/uL (ref 0.0–0.5)
Eosinophils Relative: 2 %
HCT: 41.7 % (ref 36.0–46.0)
Hemoglobin: 13.4 g/dL (ref 12.0–15.0)
Immature Granulocytes: 0 %
Lymphocytes Relative: 13 %
Lymphs Abs: 1.2 10*3/uL (ref 0.7–4.0)
MCH: 30.9 pg (ref 26.0–34.0)
MCHC: 32.1 g/dL (ref 30.0–36.0)
MCV: 96.3 fL (ref 80.0–100.0)
Monocytes Absolute: 1.3 10*3/uL — ABNORMAL HIGH (ref 0.1–1.0)
Monocytes Relative: 14 %
Neutro Abs: 7 10*3/uL (ref 1.7–7.7)
Neutrophils Relative %: 70 %
Platelets: 348 10*3/uL (ref 150–400)
RBC: 4.33 MIL/uL (ref 3.87–5.11)
RDW: 13.3 % (ref 11.5–15.5)
WBC: 9.8 10*3/uL (ref 4.0–10.5)
nRBC: 0 % (ref 0.0–0.2)

## 2018-10-30 LAB — COMPREHENSIVE METABOLIC PANEL
ALT: 15 U/L (ref 0–44)
AST: 23 U/L (ref 15–41)
Albumin: 3.3 g/dL — ABNORMAL LOW (ref 3.5–5.0)
Alkaline Phosphatase: 109 U/L (ref 38–126)
Anion gap: 11 (ref 5–15)
BUN: 12 mg/dL (ref 8–23)
CO2: 24 mmol/L (ref 22–32)
Calcium: 9.1 mg/dL (ref 8.9–10.3)
Chloride: 101 mmol/L (ref 98–111)
Creatinine, Ser: 0.68 mg/dL (ref 0.44–1.00)
GFR calc Af Amer: >60 mL/min (ref >60)
GFR calc non Af Amer: >60 mL/min (ref >60)
Glucose, Bld: 108 mg/dL — ABNORMAL HIGH (ref 70–99)
Potassium: 3.6 mmol/L (ref 3.5–5.1)
Sodium: 136 mmol/L (ref 135–145)
Total Bilirubin: 0.5 mg/dL (ref 0.3–1.2)
Total Protein: 7 g/dL (ref 6.5–8.1)

## 2018-10-30 LAB — LIPASE, BLOOD: Lipase: 31 U/L (ref 11–51)

## 2018-10-30 LAB — SARS CORONAVIRUS 2 BY RT PCR (HOSPITAL ORDER, PERFORMED IN ~~LOC~~ HOSPITAL LAB): SARS Coronavirus 2: NEGATIVE

## 2018-10-30 MED ORDER — SODIUM CHLORIDE 0.9 % IV BOLUS
500.0000 mL | Freq: Once | INTRAVENOUS | Status: AC
  Administered 2018-10-30: 21:00:00 500 mL via INTRAVENOUS

## 2018-10-30 MED ORDER — SODIUM CHLORIDE (PF) 0.9 % IJ SOLN
INTRAMUSCULAR | Status: AC
  Filled 2018-10-30: qty 50

## 2018-10-30 MED ORDER — IOHEXOL 300 MG/ML  SOLN
100.0000 mL | Freq: Once | INTRAMUSCULAR | Status: AC | PRN
  Administered 2018-10-30: 23:00:00 100 mL via INTRAVENOUS

## 2018-10-30 NOTE — ED Triage Notes (Signed)
Pt diagnosed with pneumonia today. Pt has hx of COPD. Wheezes in lower left lung. Abdomen rigid and distended, painful upon palpation. Lymph nodes removed on R arm, no BP or IV sticks on right arm. 20G in left forearm.   BP 162/90 HR 102 RR 30 SpO2 98 on 3L

## 2018-10-30 NOTE — ED Provider Notes (Signed)
[facility] DEPT Provider Note   CSN: [id number] Arrival date & time: 10/30/18  1833     History   Chief Complaint Chief Complaint  Patient presents with   Abdominal Pain   Shortness of Breath    HPI [name] is a 85 y.o. female.     HPI Patient presents with shortness of breath and abdominal pain.  Reportedly has had pneumonia.  States he was diagnosed on x-ray.  Has had a cough.  Has a history of COPD.  Does not have the x-ray with her. Also states that her abdomen is distended and painful.  Has been that way for a few days but is been worsening.  States she has been given laxatives without relief.  Has not had a good bowel movement even after 3 doses of laxatives.  No fevers.  Denies abdominal surgery. Past Medical History:  Diagnosis Date   Arthritis    Asthma    Breast cancer ([place]) 2006   right w/lumpectomy   COPD (chronic obstructive pulmonary disease) ([place])    Depression    Diverticulosis of colon    Endometrial hyperplasia 01/1996   fibroids, adenomyosis   Fall 02/2017   Frequent UTI    History of colon polyps    Hypertension    Insomnia    Osteopenia 12/08   hip   Pre-diabetes    Spinal stenosis 2001   Vitamin D deficiency     Patient Active Problem List   Diagnosis Date Noted   Acute encephalopathy [id number]   Acute metabolic encephalopathy [id number]   Acute cystitis without hematuria    Acute respiratory failure with hypoxia ([place]) 04/01/2017   Laceration of forehead    Acute on chronic respiratory failure with hypoxia ([place]) 03/15/2017   Fall 03/15/2017   Depression with anxiety 03/15/2017   COPD exacerbation ([place]) 03/15/2017   Acute lower UTI    Sepsis ([place]) 11/08/2016   Morbid obesity due to excess calories ([place]) 09/02/2016   Esophageal dysfunction 09/01/2016   Wheelchair bound 08/09/2016   Wound healing, delayed 08/09/2016   Chronic ulcer of right leg ([place]) 07/07/2016     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   Breast cancer, right breast ([place]) 07/18/2014   CONCUSSION WITH LOC OF UNSPECIFIED DURATION 07/19/2009   OTH SYMPTOMS INVOLVING RESPIRATORY SYSTEM&CHEST 11/12/2008   UNSPECIFIED DISORDER OF ANKLE AND FOOT JOINT 05/11/2008   CERUMEN IMPACTION, BILATERAL [id number]   Dysmetabolic syndrome X [id number]   UNS ADVRS EFF UNS RX MEDICINAL&BIOLOGICAL SBSTNC 08/12/2007   Other and unspecified hyperlipidemia 04/01/2007   INSOMNIA, PERSISTENT 09/09/2006   Essential hypertension 09/09/2006   DIVERTICULOSIS, COLON 09/09/2006   Osteoarthritis 09/09/2006   Spinal stenosis 09/09/2006   COLONIC POLYPS, HX OF 09/09/2006    Past Surgical History:  Procedure Laterality Date   BILATERAL SALPINGOOPHORECTOMY  12/97   BREAST LUMPECTOMY Right 03/2003   CARPAL TUNNEL RELEASE     CERVICAL FUSION     CERVICAL LAMINECTOMY  1975   x4   CHOLECYSTECTOMY  11/00   lap chole   HYSTEROSCOPY  10/97   D&C, postmenopausal bleeding   LUMBAR LAMINECTOMY  10/06   with spinal stenosis   manipulation of hip for dislocation     ROTATOR CUFF REPAIR Right 11/96   TONSILLECTOMY AND ADENOIDECTOMY     TOTAL ABDOMINAL HYSTERECTOMY  12/97   endo hyperplasia, fibroids, adenomyosis   TOTAL HIP ARTHROPLASTY Right 8/00   TOTAL HIP REVISION Right 07/10/2015   Procedure: RIGHT  ACETABULAR VS TOTAL HIP REVISION;  Surgeon: Gaynelle Arabian, MD;  Location: [facility]S;  Service: Orthopedics;  Laterality: Right;   TOTAL KNEE ARTHROPLASTY Left 06/2003   TOTAL KNEE ARTHROPLASTY Right 10/06   TUBAL LIGATION Bilateral 1968     OB History    Gravida  3   Para  3   Term  3   Preterm      AB      Living        SAB      TAB      Ectopic      Multiple      Live Births               Home Medications    Prior to Admission medications   Medication Sig Start Date End Date Taking? Authorizing Provider  acetaminophen (TYLENOL) 325 MG tablet Take  2 tablets (650 mg total) by mouth every 6 (six) hours as needed for mild pain (or Fever >/= 101). 10/02/18   Hosie Poisson, MD  albuterol (ACCUNEB) 0.63 MG/3ML nebulizer solution Take 3 mLs (0.63 mg total) by nebulization 4 (four) times daily. 03/24/17   Debbe Odea, MD  amLODipine (NORVASC) 2.5 MG tablet Take 1 tablet (2.5 mg total) by mouth daily. 10/02/18 11/01/18  Hosie Poisson, MD  apixaban (ELIQUIS) 5 MG TABS tablet Take 1 tablet (5 mg total) by mouth 2 (two) times daily. 04/10/17   Nita Sells, MD  aspirin EC 325 MG tablet Take 325 mg by mouth daily.     [provider]  Benzocaine-Menthol (CEPACOL SORE THROAT) 15-2.6 MG LOZG Use as directed 1 lozenge in the mouth or throat every 4 (four) hours as needed (for sore throat).    [provider]  benzonatate (TESSALON) 200 MG capsule Take 200 mg by mouth 3 (three) times daily as needed for cough.    [provider]  buPROPion (WELLBUTRIN XL) 300 MG 24 hr tablet Take 1 tablet (300 mg total) by mouth every morning. 04/04/17   Nita Sells, MD  Calcium Carb-Cholecalciferol (CALCIUM+D3) 600-800 MG-UNIT TABS Take 1 tablet by mouth daily.    [provider]  ciprofloxacin (CIPRO) 500 MG tablet Take 1 tablet (500 mg total) by mouth daily with breakfast. 10/03/18   Hosie Poisson, MD  escitalopram (LEXAPRO) 10 MG tablet Take 1 tablet (10 mg total) by mouth daily. 04/04/17   Nita Sells, MD  fluticasone (FLONASE) 50 MCG/ACT nasal spray Place 1 spray into both nostrils 2 (two) times daily.    [provider]  food thickener (THICK IT) POWD Use as needed for thickening fluids Patient not taking: Reported on 09/28/2018 06/15/17   Nita Sells, MD  guaiFENesin (MUCINEX) 600 MG 12 hr tablet Take 600 mg by mouth 2 (two) times daily.    [provider]  ipratropium-albuterol (DUONEB) 0.5-2.5 (3) MG/3ML SOLN Take 3 mLs by nebulization every 6 (six) hours as needed. 10/02/18   Hosie Poisson, MD  loratadine (CLARITIN) 10 MG tablet Take 10 mg by mouth daily.    [provider]  Melatonin 3 MG TABS Take 3 mg by mouth at bedtime.    [provider]  Menthol, Topical Analgesic, (BIOFREEZE) 4 % GEL Apply 1 application topically 3 (three) times daily as needed (pain).    [provider]  methocarbamol (ROBAXIN) 500 MG tablet Take 500 mg by mouth every 8 (eight) hours as needed for muscle spasms.    [provider]  metoprolol tartrate (LOPRESSOR)  25 MG tablet Take 0.5 tablets (12.5 mg total) by mouth 2 (two) times daily. 08/12/16   Florencia Reasons, MD  mirabegron ER (MYRBETRIQ) 50 MG TB24 tablet Take 50 mg by mouth daily.    [provider]  mirtazapine (REMERON) 30 MG tablet Take 30 mg by mouth at bedtime.    [provider]  mometasone-formoterol (DULERA) 200-5 MCG/ACT AERO Inhale 1 puff into the lungs 2 (two) times daily.    [provider]  montelukast (SINGULAIR) 10 MG tablet Take 10 mg by mouth at bedtime.      [provider]  nystatin (NYSTATIN) powder Apply 1 g topically 2 (two) times daily.    [provider]  ondansetron (ZOFRAN) 4 MG tablet Take 4 mg by mouth every 8 (eight) hours as needed for nausea or vomiting.    [provider]  polyethylene glycol (MIRALAX / GLYCOLAX) packet Take 17 g by mouth daily as needed for mild constipation. 07/12/15   Perkins, Alexzandrew L, PA-C  predniSONE (DELTASONE) 20 MG tablet Prednisone 40 mg daily for 3 days followed by  Prednisone 20 mg daily for 3 days followed by  Prednisone 10 mg daily for 3 days. 10/02/18   Hosie Poisson, MD  Propylene Glycol-Glycerin (SOOTHE) 0.6-0.6 % SOLN Apply 1 drop to eye 2 (two) times daily. 11/28/15   Domenic Moras, PA-C  senna (SENOKOT) 8.6 MG TABS tablet Take 1 tablet by mouth daily as needed for mild constipation.    [provider]  traMADol (ULTRAM) 50 MG tablet Take 50 mg by mouth every 6 (six) hours as needed for  moderate pain.    [provider]    Family History Family History  Problem Relation Age of Onset   Diabetes Father    Colon cancer Father 45   Heart failure Father    Arthritis Mother    Thyroid disease Mother    Osteoporosis Mother    Diabetes Brother    Diabetes Brother    Osteoporosis Maternal Aunt    Osteoporosis Maternal Grandmother     Social History Social History   Tobacco Use   Smoking status: Former Smoker    Packs/day: 0.25    Years: 10.00    Pack years: 2.50    Types: Cigarettes    Quit date: 04/24/1990    Years since quitting: 28.5   Smokeless tobacco: Never Used  Substance Use Topics   Alcohol use: Yes    Alcohol/week: 4.0 standard drinks    Types: 4 Glasses of wine per week    Comment: occ   Drug use: No     Allergies   Oxycodone hcl [oxycodone hcl], Morphine sulfate, and Sulfamethoxazole   Review of Systems Review of Systems  Constitutional: Negative for appetite change and fever.  HENT: Negative for congestion.   Respiratory: Positive for cough and shortness of breath.   Gastrointestinal: Positive for abdominal distention, abdominal pain and constipation. Negative for nausea and vomiting.  Genitourinary: Negative for flank pain.  Musculoskeletal: Negative for back pain.  Skin: Negative for rash.  Neurological: Negative for weakness.  Psychiatric/Behavioral: Negative for confusion.     Physical Exam Updated Vital Signs BP 130/89    Pulse (!) 104    Temp 97.6 F (36.4 C) (Oral)    Resp (!) 27    Ht 5\' 6"  (1.676 m)    Wt 90.7 kg    SpO2 95%    BMI 32.28 kg/m   Physical Exam Vitals signs and nursing  note reviewed.  HENT:     Head: Atraumatic.  Cardiovascular:     Rate and Rhythm: Normal rate.  Pulmonary:     Breath sounds: Wheezing present.     Comments: Mild diffuse wheezes without frank respiratory distress. Abdominal:     Hernia: No hernia is present.     Comments: Diffuse distended abdomen with diffuse  tenderness.  No hernia palpated.  Skin:    General: Skin is warm.     Capillary Refill: Capillary refill takes less than 2 seconds.  Neurological:     Mental Status: She is alert.      ED Treatments / Results  Labs (all labs ordered are listed, but only abnormal results are displayed) Labs Reviewed  COMPREHENSIVE METABOLIC PANEL - Abnormal; Notable for the following components:      Result Value   Glucose, Bld 108 (*)    Albumin 3.3 (*)    All other components within normal limits  CBC WITH DIFFERENTIAL/PLATELET - Abnormal; Notable for the following components:   Monocytes Absolute 1.3 (*)    All other components within normal limits  SARS CORONAVIRUS 2 (HOSPITAL ORDER, [facility])  LIPASE, BLOOD  URINALYSIS, ROUTINE W REFLEX MICROSCOPIC    EKG None  Radiology Dg Abdomen Acute W/chest  Result Date: 10/30/2018 CLINICAL DATA:  Abdominal pain EXAM: DG ABDOMEN ACUTE W/ 1V CHEST COMPARISON:  09/29/2018, pelvic radiograph 10/21/2011 FINDINGS: Single-view chest demonstrate surgical hardware in the cervical spine. No focal airspace disease or effusion. Normal heart size. Supine and upright views of the abdomen demonstrate no free air beneath the diaphragm. Moderate diffuse gaseous dilatation of large and small bowel. No radiopaque calculi. Bilateral hip replacements. Discontinuous appearance at the right femoral trochanter. IMPRESSION: 1. Single-view chest demonstrates no acute abnormality. 2. Moderate diffuse gaseous dilatation of large and small bowel, ileus versus distal obstruction. 3. Bilateral hip replacements. Discontinuous appearance at the right greater trochanter suggestive of a fracture though this is of unknown chronicity. Electronically Signed   By: Donavan Foil M.D.   On: 10/30/2018 21:33    Procedures Procedures (including critical care time)  Medications Ordered in ED Medications  iohexol (OMNIPAQUE) 300 MG/ML solution 100 mL (has no  administration in time range)  sodium chloride (PF) 0.9 % injection (has no administration in time range)  sodium chloride 0.9 % bolus 500 mL (500 mLs Intravenous New Bag/Given 10/30/18 2121)     Initial Impression / Assessment and Plan / ED Course  I have reviewed the triage vital signs and the nursing notes.  Pertinent labs & imaging results that were available during my care of the patient were reviewed by me and considered in my medical decision making (see chart for details).       Patient with abdominal pain.  Abdominal distention.  No nausea or vomiting.  Gas filled bowel and small bowel on acute abdominal series.  Reportedly had been told she had pneumonia with an x-ray today but x-ray here does not show pneumonia.  Not hypoxic.  CT scan pending of abdomen pelvis.  Care turned over to Dr. Stark Jock  Final Clinical Impressions(s) / ED Diagnoses   Final diagnoses:  Abdominal pain, unspecified abdominal location    ED Discharge Orders    None       Davonna Belling, MD 10/30/18 2315

## 2018-10-31 DIAGNOSIS — R1084 Generalized abdominal pain: Secondary | ICD-10-CM | POA: Diagnosis not present

## 2018-10-31 MED ORDER — ALBUTEROL SULFATE HFA 108 (90 BASE) MCG/ACT IN AERS
INHALATION_SPRAY | RESPIRATORY_TRACT | Status: AC
  Administered 2018-10-31: 05:00:00 4 via RESPIRATORY_TRACT
  Filled 2018-10-31: qty 6.7

## 2018-10-31 MED ORDER — ALBUTEROL SULFATE HFA 108 (90 BASE) MCG/ACT IN AERS
4.0000 | INHALATION_SPRAY | Freq: Once | RESPIRATORY_TRACT | Status: AC
  Administered 2018-10-31: 05:00:00 4 via RESPIRATORY_TRACT

## 2018-10-31 NOTE — ED Provider Notes (Signed)
Care assumed from Dr. Alvino Chapel at shift change.  Patient sent here for evaluation of shortness of breath and abdominal pain and distention.  Care signed out to me with patient awaiting results of a CT scan of the abdomen and pelvis.  CT scan results returned showing what appears to be a fecal impaction in the sigmoid with possible associated proctitis.  Laboratory studies and vital signs are otherwise unremarkable.  I performed a rectal examination which revealed brown, soft stool and no reachable fecal impaction.  Patient has had several bowel movements that have been soft since arriving here.  Patient does not appear obstructed and abdominal exam is benign.  At this point, I see no indication for this patient to be admitted despite her multiple requests to stay here.  Patient will be discharged to her extended care facility.  I will advise magnesium citrate for bowel cleansing and return as needed for any problems.   Veryl Speak, MD 10/31/18 [phone]

## 2018-10-31 NOTE — Discharge Instructions (Addendum)
Magnesium citrate: Drink 8 ounces mixed with equal parts Sprite or Gatorade for relief of constipation.  Follow-up with primary doctor in the next few days, and return to the ER if symptoms significantly worsen or change.

## 2019-02-24 IMAGING — DX DG CHEST 1V PORT
1 series · 1 of 1 positions shown · non-contrast
Comparison: Chest x-ray dated 11/09/2016.

CLINICAL DATA: Shortness of breath, fall.

EXAM:
PORTABLE CHEST 1 VIEW

[chest ap]
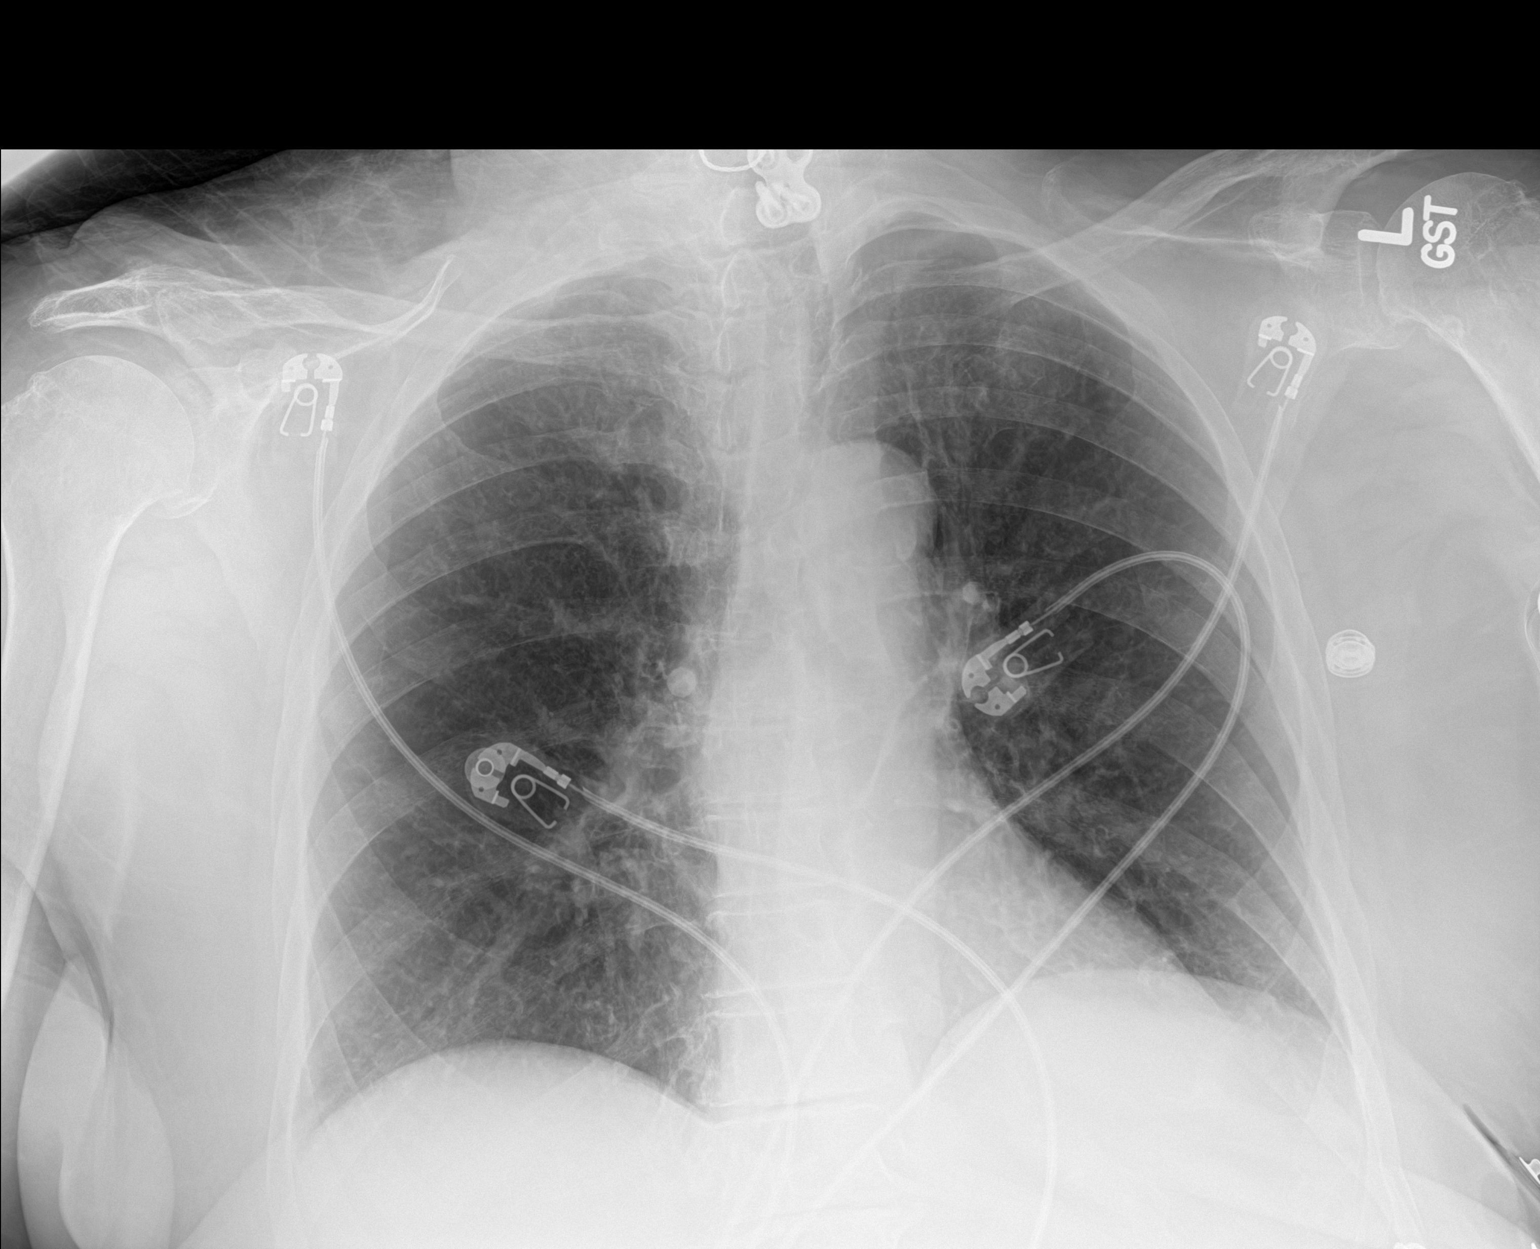

[1 of 1 positions shown; findings below may reference images not displayed]

FINDINGS: Heart size and mediastinal contours are within normal limits. Lungs
are clear. No pleural effusion or pneumothorax seen. Osseous
structures about the chest are unremarkable.
IMPRESSION: No active disease.

## 2019-03-20 DEATH — deceased

## 2019-05-24 IMAGING — DX DG CHEST 1V PORT
1 series · 1 of 1 positions shown · non-contrast
Comparison: CT chest and chest x-ray dated April 01, 2017.

CLINICAL DATA: Shortness of breath.

EXAM:
PORTABLE CHEST 1 VIEW

[chest ap]
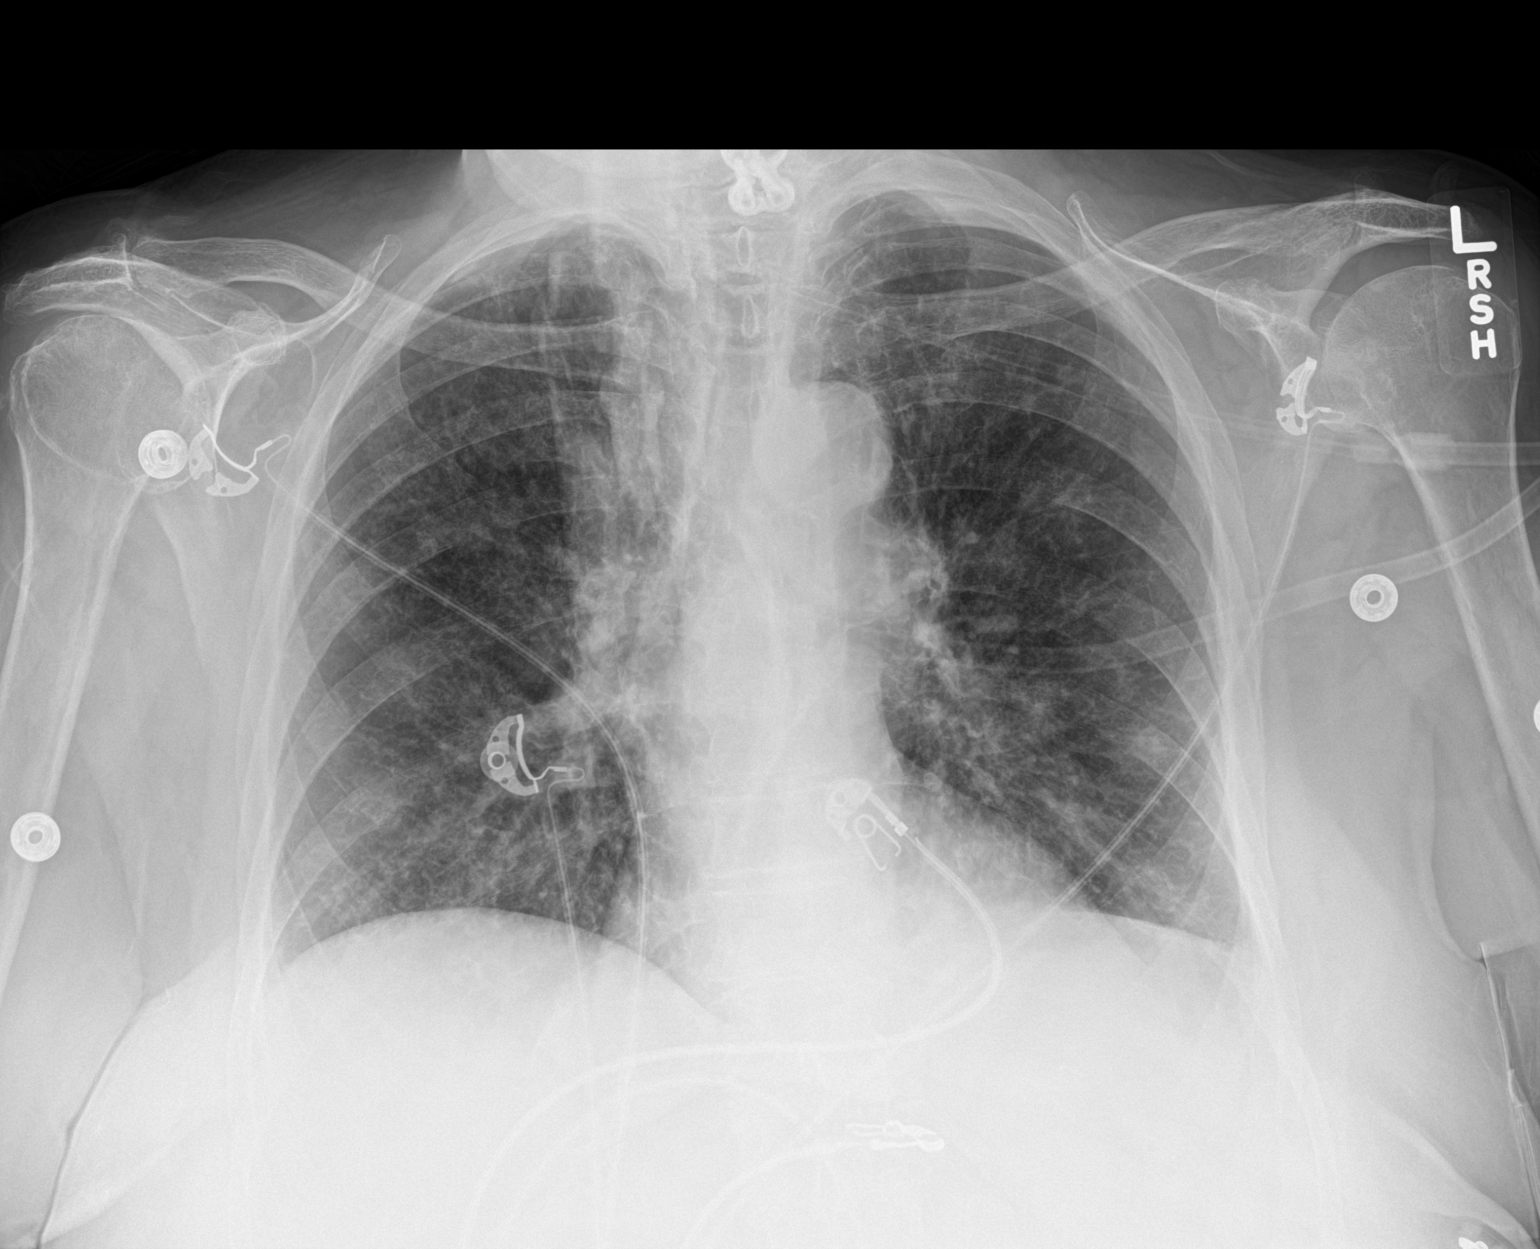

[1 of 1 positions shown; findings below may reference images not displayed]

FINDINGS: The heart size and mediastinal contours are within normal limits.
Normal pulmonary vascularity. Slightly coarsened interstitial
markings are similar to prior study. Mild bibasilar atelectasis. No
focal consolidation, pleural effusion, or pneumothorax. No acute
osseous abnormality.
IMPRESSION: No active disease.

## 2019-11-27 ENCOUNTER — Other Ambulatory Visit: Payer: Self-pay | Admitting: Oncology
